# Patient Record
Sex: Female | Born: 1955
Health system: Southern US, Community
[De-identification: ages and names within clinical notes are randomized; demographics above are authoritative.]

## PROBLEM LIST (undated history)

## (undated) DIAGNOSIS — N200 Calculus of kidney: Secondary | ICD-10-CM

## (undated) DIAGNOSIS — D649 Anemia, unspecified: Secondary | ICD-10-CM

## (undated) DIAGNOSIS — H9190 Unspecified hearing loss, unspecified ear: Secondary | ICD-10-CM

## (undated) DIAGNOSIS — E1143 Type 2 diabetes mellitus with diabetic autonomic (poly)neuropathy: Secondary | ICD-10-CM

## (undated) DIAGNOSIS — D509 Iron deficiency anemia, unspecified: Secondary | ICD-10-CM

## (undated) DIAGNOSIS — R51 Headache: Secondary | ICD-10-CM

## (undated) DIAGNOSIS — K3184 Gastroparesis: Secondary | ICD-10-CM

## (undated) DIAGNOSIS — F419 Anxiety disorder, unspecified: Secondary | ICD-10-CM

## (undated) DIAGNOSIS — E039 Hypothyroidism, unspecified: Secondary | ICD-10-CM

## (undated) DIAGNOSIS — E785 Hyperlipidemia, unspecified: Secondary | ICD-10-CM

## (undated) HISTORY — DX: Anxiety disorder, unspecified: F41.9

## (undated) HISTORY — DX: Calculus of kidney: N20.0

## (undated) HISTORY — PX: WRIST SURGERY: SHX841

## (undated) HISTORY — DX: Hypothyroidism, unspecified: E03.9

## (undated) HISTORY — DX: Hyperlipidemia, unspecified: E78.5

---

## 1998-09-07 ENCOUNTER — Other Ambulatory Visit: Admission: RE | Admit: 1998-09-07 | Discharge: 1998-09-07 | Payer: Self-pay | Admitting: *Deleted

## 1999-09-21 ENCOUNTER — Other Ambulatory Visit: Admission: RE | Admit: 1999-09-21 | Discharge: 1999-09-21 | Payer: Self-pay | Admitting: *Deleted

## 2000-11-14 ENCOUNTER — Other Ambulatory Visit: Admission: RE | Admit: 2000-11-14 | Discharge: 2000-11-14 | Payer: Self-pay | Admitting: Gynecology

## 2002-01-22 ENCOUNTER — Other Ambulatory Visit: Admission: RE | Admit: 2002-01-22 | Discharge: 2002-01-22 | Payer: Self-pay | Admitting: Gynecology

## 2002-07-16 HISTORY — PX: COLONOSCOPY: SHX174

## 2003-03-01 ENCOUNTER — Other Ambulatory Visit: Admission: RE | Admit: 2003-03-01 | Discharge: 2003-03-01 | Payer: Self-pay | Admitting: Gynecology

## 2003-03-31 ENCOUNTER — Encounter: Payer: Self-pay | Admitting: Internal Medicine

## 2003-03-31 ENCOUNTER — Encounter: Admission: RE | Admit: 2003-03-31 | Discharge: 2003-03-31 | Payer: Self-pay | Admitting: Internal Medicine

## 2003-04-29 ENCOUNTER — Ambulatory Visit (HOSPITAL_COMMUNITY): Admission: RE | Admit: 2003-04-29 | Discharge: 2003-04-29 | Payer: Self-pay | Admitting: *Deleted

## 2004-04-06 ENCOUNTER — Other Ambulatory Visit: Admission: RE | Admit: 2004-04-06 | Discharge: 2004-04-06 | Payer: Self-pay | Admitting: Gynecology

## 2008-03-19 ENCOUNTER — Emergency Department (HOSPITAL_COMMUNITY): Admission: EM | Admit: 2008-03-19 | Discharge: 2008-03-19 | Payer: Self-pay | Admitting: Emergency Medicine

## 2009-10-14 ENCOUNTER — Encounter: Payer: Self-pay | Admitting: Endocrinology

## 2009-10-14 LAB — CONVERTED CEMR LAB: Pap Smear: NORMAL

## 2010-04-21 ENCOUNTER — Encounter: Payer: Self-pay | Admitting: Pediatrics

## 2010-06-13 ENCOUNTER — Encounter: Payer: Self-pay | Admitting: Endocrinology

## 2010-08-24 ENCOUNTER — Ambulatory Visit (INDEPENDENT_AMBULATORY_CARE_PROVIDER_SITE_OTHER): Payer: 59 | Admitting: Endocrinology

## 2010-08-24 ENCOUNTER — Encounter: Payer: Self-pay | Admitting: Endocrinology

## 2010-08-24 DIAGNOSIS — IMO0002 Reserved for concepts with insufficient information to code with codable children: Secondary | ICD-10-CM | POA: Insufficient documentation

## 2010-08-24 DIAGNOSIS — E7800 Pure hypercholesterolemia, unspecified: Secondary | ICD-10-CM | POA: Insufficient documentation

## 2010-08-24 DIAGNOSIS — E1165 Type 2 diabetes mellitus with hyperglycemia: Secondary | ICD-10-CM | POA: Insufficient documentation

## 2010-08-24 DIAGNOSIS — H9190 Unspecified hearing loss, unspecified ear: Secondary | ICD-10-CM | POA: Insufficient documentation

## 2010-08-24 DIAGNOSIS — E039 Hypothyroidism, unspecified: Secondary | ICD-10-CM | POA: Insufficient documentation

## 2010-08-24 DIAGNOSIS — G43909 Migraine, unspecified, not intractable, without status migrainosus: Secondary | ICD-10-CM | POA: Insufficient documentation

## 2010-08-31 NOTE — Assessment & Plan Note (Signed)
Summary: NEW ENDO/DIABETES/THYROID/LOW BLOOD COUNT/UMR/#-LB   Vital Signs:  Patient profile:   55 year old female LMP:     07/16/2010 Height:      62 inches (157.48 cm) Weight:      140.13 pounds (63.70 kg) BMI:     25.72 O2 Sat:      96 % on Room air Temp:     98.4 degrees F (36.89 degrees C) oral Pulse rate:   68 / minute Pulse rhythm:   regular BP sitting:   108 / 78  (left arm) Cuff size:   regular  Vitals Entered By: Brenton Grills CMA Duncan Dull) (August 24, 2010 2:17 PM)  O2 Flow:  Room air CC: New Endo Consult/DM/thyroid/aj Is Patient Diabetic? Yes LMP (date): 07/16/2010     Enter LMP: 07/16/2010 Last PAP Result normal   Referring Provider:  [facility]) Primary Provider:  Merri Brunette MD  CC:  New Endo Consult/DM/thyroid/aj.  History of Present Illness: pt was noted to have slightly elev tsh at health screening, twice in 2011.   she reports few years of slight thinning of the hair on her head, and assoc fatigue  Current Medications (verified): 1)  Glipizide 5 Mg Tabs (Glipizide) .Marland Kitchen.. 1 Tablet By Mouth Two Times A Day 2)  Metformin Hcl 500 Mg Xr24h-Tab (Metformin Hcl) .... 4 Tablets By Mouth Once Daily 3)  Actos 45 Mg Tabs (Pioglitazone Hcl) .Marland Kitchen.. 1 Tablet By Mouth Once Daily 4)  Pravastatin Sodium 40 Mg Tabs (Pravastatin Sodium) .Marland Kitchen.. 1 Tablet By Mouth Once Daily 5)  Losartan Potassium 25 Mg Tabs (Losartan Potassium) .Marland Kitchen.. 1 Tablet By Mouth Once Daily 6)  Imitrex 25 Mg Tabs (Sumatriptan Succinate) .Marland Kitchen.. 1 Tablet By Mouth As Needed For Migraines 7)  Multivitamins  Tabs (Multiple Vitamin) .Marland Kitchen.. 1 By Mouth Once Daily 8)  Calcium-Vitamin D 600-200 Mg-Unit Tabs (Calcium-Vitamin D) .Marland Kitchen.. 1 Tablet By Mouth Once Daily 9)  Vitamin D3 1000 Unit Tabs (Cholecalciferol) .Marland Kitchen.. 1 Tablet By Mouth Three Times A Day 10)  B Complex  Tabs (B Complex Vitamins) .Marland Kitchen.. 1 Tablet By Mouth Once Daily 11)  Fish Oil 1000 Mg Caps (Omega-3 Fatty Acids) .Marland Kitchen.. 1 Capsule By Mouth Three  Times A Day 12)  Stool Softener 100 Mg Caps (Docusate Sodium) .... As Needed  Allergies (verified): 1)  ! Lisinopril (Lisinopril)  Past History:  Past Medical History: MIGRAINE HEADACHE (ICD-346.90) HYPERCHOLESTEROLEMIA (ICD-272.0) DM (ICD-250.00) HEARING LOSS (ICD-389.9) HYPOTHYROIDISM (ICD-244.9)  Family History: Reviewed history and no changes required. Family History of Arthritis (Parent) Family History High cholesterol (Parent, Grandparent, Other Blood Relatives) Family History Hypertension (Parents, Grandparents) Family History of Colon Cancer (Grandparent) Family History of Breast Cancer (Other Blood Relative) goiter: grandmother dm: none  Social History: Reviewed history and no changes required. Married Never Smoked Alcohol use-no Drug use-yes Regular exercise-no works as Museum/gallery exhibitions officer.   Smoking Status:  never Drug Use:  yes Does Patient Exercise:  no Seat Belt Use:  yes  Review of Systems       denies depression, sob, memory loss, numbness, myalgias, dry skin, syncope, easy bruising, and rhinorrhea.  she has muscle cramps.  she has slight leg edema.  she has lost 30 lbs, x 1 year.  she has constipation and slight blurry vision.     Physical Exam  General:  Well developed, well nourished, in no acute distress.  Head:  head: no deformity eyes: no periorbital swelling, no proptosis external nose and ears are normal mouth: no  lesion seen Neck:  thyroid is not enlarged Lungs:  Clear to auscultation bilaterally. Normal respiratory effort.  Heart:  Regular rate and rhythm without murmurs or gallops noted. Normal S1,S2.   Abdomen:  abdomen is soft, nontender.  no hepatosplenomegaly.   not distended.  no hernia  Msk:  muscle bulk and strength are grossly normal.  no obvious joint swelling.  gait is normal and steady  Extremities:  trace right pedal edema and trace left pedal edema.  no deformity  Neurologic:  cn 2-12 grossly intact.   readily  moves all 4's.   sensation is intact to touch on all 4's Skin:  normal texture and temp.  no rash.  not diaphoretic  Cervical Nodes:  No significant adenopathy.  Psych:  Alert and cooperative; normal mood and affect; normal attention span and concentration.   Additional Exam:  outside test results are reviewed:  tsh=5.0 (2011)   Impression & Recommendations:  Problem # 1:  HYPOTHYROIDISM (ICD-244.9) we discussed the risks and benefits of synthroid (both of which are low).    Problem # 2:  DM (ICD-250.00) #1 has little if any effect on this.    Problem # 3:  edema mild due to actos she does not need to stop the actos unless the edema is worse.    Medications Added to Medication List This Visit: 1)  Glipizide 5 Mg Tabs (Glipizide) .Marland Kitchen.. 1 tablet by mouth two times a day 2)  Metformin Hcl 500 Mg Xr24h-tab (Metformin hcl) .... 4 tablets by mouth once daily 3)  Actos 45 Mg Tabs (Pioglitazone hcl) .Marland Kitchen.. 1 tablet by mouth once daily 4)  Pravastatin Sodium 40 Mg Tabs (Pravastatin sodium) .Marland Kitchen.. 1 tablet by mouth once daily 5)  Losartan Potassium 25 Mg Tabs (Losartan potassium) .Marland Kitchen.. 1 tablet by mouth once daily 6)  Imitrex 25 Mg Tabs (Sumatriptan succinate) .Marland Kitchen.. 1 tablet by mouth as needed for migraines 7)  Multivitamins Tabs (Multiple vitamin) .Marland Kitchen.. 1 by mouth once daily 8)  Calcium-vitamin D 600-200 Mg-unit Tabs (Calcium-vitamin d) .Marland Kitchen.. 1 tablet by mouth once daily 9)  Vitamin D3 1000 Unit Tabs (Cholecalciferol) .Marland Kitchen.. 1 tablet by mouth three times a day 10)  B Complex Tabs (B complex vitamins) .Marland Kitchen.. 1 tablet by mouth once daily 11)  Fish Oil 1000 Mg Caps (Omega-3 fatty acids) .Marland Kitchen.. 1 capsule by mouth three times a day 12)  Stool Softener 100 Mg Caps (Docusate sodium) .... As needed 13)  Levothyroxine Sodium 25 Mcg Tabs (Levothyroxine sodium) .Marland Kitchen.. 1 tab once daily  Other Orders: Consultation Level IV (16109)  Patient Instructions: 1)  cc dr piggott (g[place], [place]), and dr pharr. 2)  take  levothyroxine 25 micrograms/day 3)  come back to our lab in about 1 month, to recheck tsh 244.9 4)  return here as needed. Prescriptions: LEVOTHYROXINE SODIUM 25 MCG TABS (LEVOTHYROXINE SODIUM) 1 tab once daily  #90 x 3   Entered and Authorized by:   Minus Breeding MD   Signed by:   Minus Breeding MD on 08/24/2010   Method used:   Electronically to        [facility]* (retail)       [address]       [address] Shipping/mailing       [place], [place]  [address]       Ph: [phone]       Fax: [phone]   RxID:   [id number]    Orders Added:  1)  Consultation Level IV [30865]   Immunization History:  Tetanus/Td Immunization History:    Tetanus/Td:  historical (07/17/2007)  Influenza Immunization History:    Influenza:  historical (05/16/2010)  Pneumovax Immunization History:    Pneumovax:  historical (07/16/2008)   Immunization History:  Tetanus/Td Immunization History:    Tetanus/Td:  Historical (07/17/2007)  Influenza Immunization History:    Influenza:  Historical (05/16/2010)  Pneumovax Immunization History:    Pneumovax:  Historical (07/16/2008)    Preventive Care Screening  Mammogram:    Date:  04/15/2010    Results:  normal   Pap Smear:    Date:  10/14/2009    Results:  normal   Last Tetanus Booster:    Date:  07/17/2007    Results:  Historical   Colonoscopy:    Date:  07/17/2003    Results:  done

## 2010-09-06 NOTE — Letter (Signed)
Summary: Employer Screening  Employer Screening   Imported By: Lester Millard 08/28/2010 10:33:59  _____________________________________________________________________  External Attachment:    Type:   Image     Comment:   External Document

## 2010-11-02 ENCOUNTER — Other Ambulatory Visit (INDEPENDENT_AMBULATORY_CARE_PROVIDER_SITE_OTHER): Payer: 59

## 2010-11-02 DIAGNOSIS — E039 Hypothyroidism, unspecified: Secondary | ICD-10-CM

## 2010-11-04 ENCOUNTER — Emergency Department (HOSPITAL_COMMUNITY): Payer: 59

## 2010-11-04 ENCOUNTER — Emergency Department (HOSPITAL_COMMUNITY)
Admission: EM | Admit: 2010-11-04 | Discharge: 2010-11-04 | Disposition: A | Discharge status: Home / Self Care | Payer: 59 | Attending: Emergency Medicine | Admitting: Emergency Medicine

## 2010-11-04 DIAGNOSIS — Z79899 Other long term (current) drug therapy: Secondary | ICD-10-CM | POA: Insufficient documentation

## 2010-11-04 DIAGNOSIS — E119 Type 2 diabetes mellitus without complications: Secondary | ICD-10-CM | POA: Insufficient documentation

## 2010-11-04 DIAGNOSIS — R079 Chest pain, unspecified: Secondary | ICD-10-CM | POA: Insufficient documentation

## 2010-11-04 DIAGNOSIS — E039 Hypothyroidism, unspecified: Secondary | ICD-10-CM | POA: Insufficient documentation

## 2010-11-04 DIAGNOSIS — D649 Anemia, unspecified: Secondary | ICD-10-CM | POA: Insufficient documentation

## 2010-11-04 LAB — POCT CARDIAC MARKERS
CKMB, poc: <1 ng/mL — ABNORMAL LOW (ref 1.0–8.0)
CKMB, poc: <1 ng/mL — ABNORMAL LOW (ref 1.0–8.0)
Myoglobin, poc: 24.5 ng/mL (ref 12–200)
Troponin i, poc: <0.05 ng/mL (ref 0.00–0.09)

## 2010-11-04 LAB — DIFFERENTIAL
Basophils Absolute: 0 10*3/uL (ref 0.0–0.1)
Basophils Relative: 1 % (ref 0–1)
Lymphs Abs: 2.6 10*3/uL (ref 0.7–4.0)
Monocytes Absolute: 0.5 10*3/uL (ref 0.1–1.0)

## 2010-11-04 LAB — BASIC METABOLIC PANEL
BUN: 20 mg/dL (ref 6–23)
CO2: 26 mEq/L (ref 19–32)
GFR calc non Af Amer: >60 mL/min (ref >60)
Glucose, Bld: 217 mg/dL — ABNORMAL HIGH (ref 70–99)
Potassium: 3.8 mEq/L (ref 3.5–5.1)

## 2010-11-04 LAB — CBC
MCHC: 31.9 g/dL (ref 30.0–36.0)
MCV: 66.4 fL — ABNORMAL LOW (ref 78.0–100.0)
Platelets: 208 10*3/uL (ref 150–400)
RDW: 14.9 % (ref 11.5–15.5)
WBC: 6 10*3/uL (ref 4.0–10.5)

## 2010-12-01 NOTE — Op Note (Signed)
   NAME:  Melissa Schneider, Melissa Schneider                       ACCOUNT NO.:  [id number]   MEDICAL RECORD NO.:  [id number]                   PATIENT TYPE:  AMB   LOCATION:  ENDO                                 FACILITY:  [facility]   PHYSICIAN:  Georgiana Spinner, M.D.                 DATE OF BIRTH:  [birth year]   DATE OF PROCEDURE:  04/29/2003  DATE OF DISCHARGE:                                 OPERATIVE REPORT   PROCEDURE PERFORMED:  Colonoscopy.   ENDOSCOPIST:  Georgiana Spinner, M.D.   INDICATIONS FOR PROCEDURE:  Rectal bleeding.   ANESTHESIA:  Demerol 90 mg, Versed 9 mg.   DESCRIPTION OF PROCEDURE:  With the patient mildly sedated in the left  lateral decubitus position, the Olympus video colonoscope was inserted in  the rectum and passed under direct vision with pressure applied to the  abdomen to the cecum, identified by the ileocecal valve and appendiceal  orifice, both of which were photographed.  We entered into the terminal  ileum which also appeared normal and was photographed.  From this point the  colonoscope was slowly withdrawn taking circumferential views of the colonic  mucosa stopping only in the rectum which appeared normal on direct and  showed small hemorrhoids on retroflex view.  The endoscope was straightened  and withdrawn.  The patient's vital signs and pulse oximeter remained  stable.  The patient tolerated the procedure well without apparent  complications.   FINDINGS:  On rectal examination, I felt a small fissure and small internal  hemorrhoids were visualized but we were unable to photograph them.   IMPRESSION:  Probably anorectal disease cause of her rectal bleeding,  otherwise an unremarkable examination.  Follow-up with me as needed.                                                Georgiana Spinner, M.D.    GMO/MEDQ  D:  04/29/2003  T:  04/29/2003  Job:  [id number]

## 2011-07-20 ENCOUNTER — Encounter: Payer: Self-pay | Admitting: *Deleted

## 2011-07-20 ENCOUNTER — Other Ambulatory Visit: Payer: Self-pay

## 2011-07-20 ENCOUNTER — Inpatient Hospital Stay (HOSPITAL_COMMUNITY)
Admission: EM | Admit: 2011-07-20 | Discharge: 2011-07-24 | DRG: 638 | Disposition: A | Discharge status: Home / Self Care | Payer: 59 | Attending: Internal Medicine | Admitting: Internal Medicine

## 2011-07-20 ENCOUNTER — Emergency Department (HOSPITAL_COMMUNITY): Payer: 59

## 2011-07-20 DIAGNOSIS — D509 Iron deficiency anemia, unspecified: Secondary | ICD-10-CM | POA: Diagnosis present

## 2011-07-20 DIAGNOSIS — E1165 Type 2 diabetes mellitus with hyperglycemia: Secondary | ICD-10-CM | POA: Diagnosis present

## 2011-07-20 DIAGNOSIS — E871 Hypo-osmolality and hyponatremia: Secondary | ICD-10-CM | POA: Diagnosis present

## 2011-07-20 DIAGNOSIS — K5900 Constipation, unspecified: Secondary | ICD-10-CM | POA: Diagnosis present

## 2011-07-20 DIAGNOSIS — E7800 Pure hypercholesterolemia, unspecified: Secondary | ICD-10-CM | POA: Diagnosis present

## 2011-07-20 DIAGNOSIS — K3184 Gastroparesis: Secondary | ICD-10-CM | POA: Diagnosis present

## 2011-07-20 DIAGNOSIS — IMO0002 Reserved for concepts with insufficient information to code with codable children: Secondary | ICD-10-CM | POA: Diagnosis present

## 2011-07-20 DIAGNOSIS — D649 Anemia, unspecified: Secondary | ICD-10-CM | POA: Diagnosis present

## 2011-07-20 DIAGNOSIS — E1143 Type 2 diabetes mellitus with diabetic autonomic (poly)neuropathy: Secondary | ICD-10-CM | POA: Diagnosis present

## 2011-07-20 DIAGNOSIS — D72829 Elevated white blood cell count, unspecified: Secondary | ICD-10-CM | POA: Diagnosis present

## 2011-07-20 DIAGNOSIS — Z794 Long term (current) use of insulin: Secondary | ICD-10-CM

## 2011-07-20 DIAGNOSIS — E1310 Other specified diabetes mellitus with ketoacidosis without coma: Principal | ICD-10-CM | POA: Diagnosis present

## 2011-07-20 DIAGNOSIS — E039 Hypothyroidism, unspecified: Secondary | ICD-10-CM | POA: Diagnosis present

## 2011-07-20 DIAGNOSIS — G43909 Migraine, unspecified, not intractable, without status migrainosus: Secondary | ICD-10-CM | POA: Diagnosis present

## 2011-07-20 DIAGNOSIS — E1110 Type 2 diabetes mellitus with ketoacidosis without coma: Secondary | ICD-10-CM | POA: Diagnosis present

## 2011-07-20 DIAGNOSIS — E785 Hyperlipidemia, unspecified: Secondary | ICD-10-CM | POA: Diagnosis present

## 2011-07-20 DIAGNOSIS — E876 Hypokalemia: Secondary | ICD-10-CM | POA: Diagnosis not present

## 2011-07-20 DIAGNOSIS — E1149 Type 2 diabetes mellitus with other diabetic neurological complication: Secondary | ICD-10-CM | POA: Diagnosis present

## 2011-07-20 HISTORY — DX: Iron deficiency anemia, unspecified: D50.9

## 2011-07-20 HISTORY — DX: Anemia, unspecified: D64.9

## 2011-07-20 HISTORY — DX: Type 2 diabetes mellitus with diabetic autonomic (poly)neuropathy: E11.43

## 2011-07-20 HISTORY — DX: Gastroparesis: K31.84

## 2011-07-20 HISTORY — DX: Unspecified hearing loss, unspecified ear: H91.90

## 2011-07-20 LAB — GLUCOSE, CAPILLARY
Glucose-Capillary: 531 mg/dL — ABNORMAL HIGH (ref 70–99)
Glucose-Capillary: 495 mg/dL — ABNORMAL HIGH (ref 70–99)
Glucose-Capillary: 390 mg/dL — ABNORMAL HIGH (ref 70–99)
Glucose-Capillary: 415 mg/dL — ABNORMAL HIGH (ref 70–99)
Glucose-Capillary: 376 mg/dL — ABNORMAL HIGH (ref 70–99)
Glucose-Capillary: 201 mg/dL — ABNORMAL HIGH (ref 70–99)
Glucose-Capillary: 202 mg/dL — ABNORMAL HIGH (ref 70–99)
Glucose-Capillary: 136 mg/dL — ABNORMAL HIGH (ref 70–99)
Glucose-Capillary: 134 mg/dL — ABNORMAL HIGH (ref 70–99)

## 2011-07-20 LAB — COMPREHENSIVE METABOLIC PANEL
ALT: 17 U/L (ref 0–35)
AST: 17 U/L (ref 0–37)
Albumin: 4 g/dL (ref 3.5–5.2)
CO2: <5 mEq/L — CL (ref 19–32)
Calcium: 10 mg/dL (ref 8.4–10.5)
Creatinine, Ser: 0.71 mg/dL (ref 0.50–1.10)
Sodium: 130 mEq/L — ABNORMAL LOW (ref 135–145)
Total Protein: 7.6 g/dL (ref 6.0–8.3)

## 2011-07-20 LAB — URINE MICROSCOPIC-ADD ON

## 2011-07-20 LAB — BASIC METABOLIC PANEL
CO2: <5 mEq/L — CL (ref 19–32)
Calcium: 9 mg/dL (ref 8.4–10.5)
Chloride: 109 mEq/L (ref 96–112)
Chloride: 107 mEq/L (ref 96–112)
GFR calc Af Amer: >90 mL/min (ref >90)
GFR calc Af Amer: >90 mL/min (ref >90)
GFR calc Af Amer: >90 mL/min (ref >90)
GFR calc non Af Amer: >90 mL/min (ref >90)
GFR calc non Af Amer: >90 mL/min (ref >90)
Potassium: 3.3 mEq/L — ABNORMAL LOW (ref 3.5–5.1)
Potassium: 3.2 mEq/L — ABNORMAL LOW (ref 3.5–5.1)
Potassium: 3.4 mEq/L — ABNORMAL LOW (ref 3.5–5.1)
Sodium: 135 mEq/L (ref 135–145)
Sodium: 133 mEq/L — ABNORMAL LOW (ref 135–145)

## 2011-07-20 LAB — URINALYSIS, ROUTINE W REFLEX MICROSCOPIC
Glucose, UA: >1000 mg/dL — AB
Ketones, ur: >80 mg/dL — AB
Leukocytes, UA: NEGATIVE
pH: 5.5 (ref 5.0–8.0)

## 2011-07-20 LAB — CBC
HCT: 39.4 % (ref 36.0–46.0)
Hemoglobin: 11.8 g/dL — ABNORMAL LOW (ref 12.0–15.0)
Hemoglobin: 11.1 g/dL — ABNORMAL LOW (ref 12.0–15.0)
MCH: 20.6 pg — ABNORMAL LOW (ref 26.0–34.0)
MCH: 20.4 pg — ABNORMAL LOW (ref 26.0–34.0)
MCHC: 29.9 g/dL — ABNORMAL LOW (ref 30.0–36.0)
MCV: 68.8 fL — ABNORMAL LOW (ref 78.0–100.0)
Platelets: 261 10*3/uL (ref 150–400)
RBC: 5.73 MIL/uL — ABNORMAL HIGH (ref 3.87–5.11)
RBC: 5.45 MIL/uL — ABNORMAL HIGH (ref 3.87–5.11)
WBC: 20.7 10*3/uL — ABNORMAL HIGH (ref 4.0–10.5)

## 2011-07-20 LAB — CARDIAC PANEL(CRET KIN+CKTOT+MB+TROPI): Relative Index: INVALID (ref 0.0–2.5)

## 2011-07-20 MED ORDER — SODIUM CHLORIDE 0.9 % IV SOLN: INTRAVENOUS | Status: DC

## 2011-07-20 MED ORDER — SODIUM CHLORIDE 0.9 % IV BOLUS (SEPSIS)
1000.0000 mL | Freq: Once | INTRAVENOUS | Status: AC
  Administered 2011-07-20: 1000 mL via INTRAVENOUS

## 2011-07-20 MED ORDER — SODIUM BICARBONATE 8.4 % IV SOLN
50.0000 meq | Freq: Once | INTRAVENOUS | Status: AC
  Administered 2011-07-20: 50 meq via INTRAVENOUS
  Filled 2011-07-20: qty 50

## 2011-07-20 MED ORDER — SODIUM CHLORIDE 0.9 % IV SOLN
INTRAVENOUS | Status: DC
  Administered 2011-07-20: 19:00:00 via INTRAVENOUS
  Filled 2011-07-20: qty 1

## 2011-07-20 MED ORDER — INSULIN REGULAR HUMAN 100 UNIT/ML IJ SOLN
INTRAMUSCULAR | Status: AC
  Filled 2011-07-20: qty 3

## 2011-07-20 MED ORDER — ONDANSETRON HCL 4 MG/2ML IJ SOLN
4.0000 mg | Freq: Once | INTRAMUSCULAR | Status: AC
  Administered 2011-07-20: 4 mg via INTRAVENOUS
  Filled 2011-07-20: qty 2

## 2011-07-20 MED ORDER — PNEUMOCOCCAL VAC POLYVALENT 25 MCG/0.5ML IJ INJ
0.5000 mL | INJECTION | INTRAMUSCULAR | Status: AC
  Filled 2011-07-20: qty 0.5

## 2011-07-20 MED ORDER — INSULIN REGULAR HUMAN 100 UNIT/ML IJ SOLN
INTRAMUSCULAR | Status: DC
  Administered 2011-07-20: 4.4 [IU]/h via INTRAVENOUS
  Filled 2011-07-20: qty 1

## 2011-07-20 MED ORDER — SUMATRIPTAN SUCCINATE 6 MG/0.5ML ~~LOC~~ SOLN
6.0000 mg | SUBCUTANEOUS | Status: AC | PRN
  Administered 2011-07-21 (×2): 6 mg via SUBCUTANEOUS
  Filled 2011-07-20: qty 0.5

## 2011-07-20 MED ORDER — HYDROMORPHONE HCL PF 1 MG/ML IJ SOLN
1.0000 mg | Freq: Once | INTRAMUSCULAR | Status: AC
  Administered 2011-07-20: 1 mg via INTRAVENOUS
  Filled 2011-07-20: qty 1

## 2011-07-20 MED ORDER — INSULIN REGULAR BOLUS VIA INFUSION: 0.0000 [IU] | Freq: Three times a day (TID) | INTRAVENOUS | Status: DC

## 2011-07-20 MED ORDER — INSULIN ASPART 100 UNIT/ML ~~LOC~~ SOLN
10.0000 [IU] | Freq: Once | SUBCUTANEOUS | Status: DC
  Filled 2011-07-20: qty 3

## 2011-07-20 MED ORDER — DEXTROSE 50 % IV SOLN: 25.0000 mL | INTRAVENOUS | Status: DC | PRN

## 2011-07-20 MED ORDER — DEXTROSE-NACL 5-0.45 % IV SOLN: INTRAVENOUS | Status: DC

## 2011-07-20 MED ORDER — POTASSIUM CHLORIDE 10 MEQ/100ML IV SOLN
10.0000 meq | INTRAVENOUS | Status: AC
  Administered 2011-07-20 (×4): 10 meq via INTRAVENOUS
  Filled 2011-07-20: qty 100
  Filled 2011-07-20: qty 200
  Filled 2011-07-20: qty 100

## 2011-07-20 MED ORDER — INSULIN REGULAR HUMAN 100 UNIT/ML IJ SOLN
10.0000 [IU] | Freq: Once | INTRAMUSCULAR | Status: DC
  Filled 2011-07-20 (×2): qty 0.1

## 2011-07-20 MED ORDER — DEXTROSE-NACL 5-0.45 % IV SOLN
INTRAVENOUS | Status: DC
  Administered 2011-07-20: 19:00:00 via INTRAVENOUS
  Administered 2011-07-21: 125 mL via INTRAVENOUS

## 2011-07-20 MED ORDER — SODIUM BICARBONATE 4 % IV SOLN: 5.0000 mL | Freq: Once | INTRAVENOUS | Status: DC

## 2011-07-20 MED ORDER — INFLUENZA VIRUS VACC SPLIT PF IM SUSP
0.5000 mL | INTRAMUSCULAR | Status: DC
  Filled 2011-07-20: qty 0.5

## 2011-07-20 NOTE — ED Notes (Signed)
Social worker. Notified of BGL 495

## 2011-07-20 NOTE — ED Notes (Signed)
Called icu to give report, no answer

## 2011-07-20 NOTE — H&P (Signed)
PCP:   Londell Moh, MD, MD   Chief Complaint:  Nausea vomiting  HPI: Patient is a 56 year old white female with past medical history diabetes mellitus type 2 on insulin he started having episodes of uncontrolled nausea and vomiting and abdominal discomfort for the past day. She was noted to have an elevated blood sugar this morning of 435 and with her nausea and vomiting persisting, he felt it best to come into the emergency room. Upon arrival to the emergency room, her. Sugar was noted to be 537 with a bicarbonate level of less than 5 consistent with an elevated anion gap of greater than 30. Patient was started on IV fluids and IV insulin as per the DKA protocol and hospitals were called for admission.  Patient's white blood cell count was also noted to be elevated at 20. She had no fever. She was hypotensive, but this was felt to be more because of dehydration. A pro calcitonin and lactic acid level ordered and were negative. Incidentally cardiac markers were also negative.  Patient was then sent upstairs to the step down unit. Upon arrival her blood sugar was in the 200s, but with a repeat basic metabolic panel, her bicarbonate was still less than 5 although overall her anion gap had improved to now a little above 20. The patient her so so she's a little better. She denies any headaches, vision changes, dysphasia, chest pain, palpitations, shortness of breath, wheeze, cough, decreased discomfort in her abdomen, hematuria, dysuria, constipation or diarrhea. She says her mouth feels very dry and that is her biggest complaint. Review of systems otherwise negative.   Past Medical History: Past Medical History  Diagnosis Date  . Diabetes mellitus   . Hearing difficulty    History reviewed. No pertinent past surgical history.  Medications: Prior to Admission medications   Medication Sig Start Date End Date Taking? Authorizing Provider  glipiZIDE (GLUCOTROL) 5 MG tablet Take 5 mg by  mouth 2 (two) times daily before a meal.     Yes Historical Provider, MD  insulin glargine (LANTUS) 100 UNIT/ML injection Inject 20 Units into the skin at bedtime.     Yes Historical Provider, MD  levothyroxine (SYNTHROID, LEVOTHROID) 50 MCG tablet Take 50 mcg by mouth daily.     Yes Historical Provider, MD  losartan (COZAAR) 25 MG tablet Take 25 mg by mouth daily.     Yes Historical Provider, MD  metformin (FORTAMET) 1000 MG (OSM) 24 hr tablet Take 1,000 mg by mouth daily with breakfast.     Yes Historical Provider, MD  pravastatin (PRAVACHOL) 40 MG tablet Take 40 mg by mouth daily.     Yes Historical Provider, MD  sitaGLIPtin (JANUVIA) 100 MG tablet Take 100 mg by mouth every evening.    Yes Historical Provider, MD  SUMAtriptan (IMITREX) 100 MG tablet Take 100 mg by mouth every 2 (two) hours as needed. For migraines   Yes Historical Provider, MD  SUMAtriptan (IMITREX) 6 MG/0.5ML SOLN injection Inject 6 mg into the skin every 2 (two) hours as needed. For migraines    Yes Historical Provider, MD    Allergies:   Allergies  Allergen Reactions  . Lisinopril     REACTION: coughing    Social History:  reports that she has never smoked. She does not have any smokeless tobacco history on file. She reports that she does not drink alcohol or use illicit drugs. The patient is normally at baseline able to participate in normal activities of daily living  without assistance. She lives at home with her husband.  Family History: Hypertension Physical Exam: Filed Vitals:   07/20/11 1628 07/20/11 1700 07/20/11 1800 07/20/11 1900  BP: 121/57 103/40 115/58 92/53  Pulse: 102 96 106 97  Temp: 97.6 F (36.4 C)     TempSrc: Oral     Resp: 35 33 25 21  Height:      Weight:      SpO2: 100% 100% 100% 100%   General: Alert and oriented x3, some mild distress secondary from being very fatigued and dehydrated, looks about stated age, fatigued HEENT: Normocephalic, atraumatic, mucous membranes are quite  dry Cardiovascular: Regular rhythm, occasional ectopic beat, mild tachycardia Lungs:" Bilaterally Abdomen: Soft, nontender, nondistended, hypoactive bowel sounds Extremities: No clubbing or cyanosis or edema.    Labs on Admission:   [facility] 07/20/11 1758 07/20/11 0845  NA 135 130*  K 3.3* 4.1  CL 109 95*  CO2 <5* <5*  GLUCOSE 196* 537*  BUN 17 19  CREATININE 0.66 0.71  CALCIUM 9.1 10.0  MG -- --  PHOS -- --    Basename 07/20/11 0845  AST 17  ALT 17  ALKPHOS 140*  BILITOT 0.1*  PROT 7.6  ALBUMIN 4.0    Basename 07/20/11 0845  LIPASE 67*  AMYLASE --    Basename 07/20/11 0845  WBC 20.2*  NEUTROABS --  HGB 11.8*  HCT 39.4  MCV 68.8*  PLT 301    Basename 07/20/11 1148  CKTOTAL 58  CKMB 2.6  CKMBINDEX --  TROPONINI <0.30   Pro calcitonin and lactic acid level normal. Urinalysis unremarkable.  Radiological Exams on Admission: Dg Chest Portable 1 View 07/20/2011   IMPRESSION: No active cardiopulmonary abnormalities.      Assessment/Plan Present on Admission:  .HYPOTHYROIDISM: Holding Synthroid until she is able to take by mouth.  Marland KitchenHYPERCHOLESTEROLEMIA: Holding statin until she is able to take by mouth.  Marland KitchenMIGRAINE HEADACHE: Stable. No headache at this time. She is on when necessary Imitrex.  .DM (diabetes mellitus), type 2, uncontrolled: Check A1 C. See below.  .DKA, type 2: Anion gap still elevated. Continue IV fluids of D5 half-normal saline plus insulin IV until her gap has resolved.  .Leukocytosis: Unclear she has an infection or this is from stress margination. It is not exactly clear as to what caused her DKA, although that may have been a brief gastroenteritis versus flu area watch for fever. Recheck by blood cell count has been almost 10 hours. Continue monitor closely.  After discussion with the patient, she is to be a full code.  We will respect these wishes.  I anticipate her length of stay to be 2-3 days based on recovery from DKA and  identification of cause.  Time spent on this patient including examination and decision-making process: 55 minutes.  Hollice Espy [phone] 07/20/2011, 7:17 PM

## 2011-07-20 NOTE — ED Notes (Signed)
Shanda Bumps RN called, I gave report

## 2011-07-20 NOTE — Progress Notes (Signed)
2247-lab called with critical lab of C02 of 8. Text paged Dr. Onalee Hua.

## 2011-07-20 NOTE — Progress Notes (Signed)
2120-DrOnalee Hua ordered 1 amp of bicarb for 2230

## 2011-07-20 NOTE — Progress Notes (Signed)
2115-critical lab-C02<5. Notified Dr. Onalee Hua.

## 2011-07-20 NOTE — ED Notes (Signed)
BGL 531 - notified Amber R.N.

## 2011-07-20 NOTE — ED Provider Notes (Addendum)
Scribed for Melissa Jakes, MD, the patient was seen in room APA05/APA05 . This chart was scribed by Ellie Lunch.   CSN: [id number]  Arrival date & time 07/20/11  0102   First MD Initiated Contact with Patient 07/20/11 0940      Chief Complaint  Patient presents with  . Emesis    (Consider location/radiation/quality/duration/timing/severity/associated sxs/prior treatment) The history is provided by the patient and the spouse. No language interpreter was used.   Pt seen at 10:07 AM Melissa Schneider is a 56 y.o. female who presents to the Emergency Department complaining of 1 day of sudden onset emesis. Pt woke up yesterday with HA, generalized abdominal pain, and body aches. Pt began vomiting at midnight and has had 7 episodes of emesis since. Emesis is  associated with nausea, generalized weakness, and elevated blood sugar (435 at home this am). Body aches and HA still persist as well. Pt denies any diarrhea, rash, neck pain, dysuria, or pedal edema. There are no other associated symptoms and no other alleviating or aggravating factors.      Past Medical History  Diagnosis Date  . Diabetes mellitus   . Hearing difficulty     History reviewed. No pertinent past surgical history.  No family history on file.  History  Substance Use Topics  . Smoking status: Never Smoker   . Smokeless tobacco: Not on file  . Alcohol Use: No    Review of Systems  Constitutional: Negative for fever and chills.       10 Systems reviewed and are negative for acute change except as noted in the HPI.  HENT: Negative for congestion and rhinorrhea.   Eyes: Negative for discharge and redness.  Respiratory: Negative for cough and shortness of breath.   Cardiovascular: Negative for chest pain.  Gastrointestinal: Positive for nausea, vomiting and abdominal pain. Negative for diarrhea.  Genitourinary: Negative for dysuria.  Musculoskeletal: Positive for myalgias.  Skin: Negative for rash.    Neurological: Positive for weakness and headaches. Negative for dizziness, syncope, speech difficulty and numbness.  Psychiatric/Behavioral: Negative for hallucinations and confusion.    Allergies  Lisinopril  Home Medications   Current Outpatient Rx  Name Route Sig Dispense Refill  . GLIPIZIDE 5 MG PO TABS Oral Take 5 mg by mouth 2 (two) times daily before a meal.      . INSULIN GLARGINE 100 UNIT/ML [place] SOLN Subcutaneous Inject 20 Units into the skin at bedtime.      Marland Kitchen LEVOTHYROXINE SODIUM 50 MCG PO TABS Oral Take 50 mcg by mouth daily.      Marland Kitchen LOSARTAN POTASSIUM 25 MG PO TABS Oral Take 25 mg by mouth daily.      Marland Kitchen METFORMIN HCL ER (OSM) 1000 MG PO TB24 Oral Take 1,000 mg by mouth daily with breakfast.      . PRAVASTATIN SODIUM 40 MG PO TABS Oral Take 40 mg by mouth daily.      Marland Kitchen SITAGLIPTIN PHOSPHATE 100 MG PO TABS Oral Take 100 mg by mouth every evening.     . SUMATRIPTAN SUCCINATE 100 MG PO TABS Oral Take 100 mg by mouth every 2 (two) hours as needed. For migraines    . SUMATRIPTAN SUCCINATE 6 MG/0.5ML [place] SOLN Subcutaneous Inject 6 mg into the skin every 2 (two) hours as needed. For migraines       BP 101/56  Pulse 105  Temp 97.9 F (36.6 C)  Resp 24  Ht 5\' 2"  (1.575 m)  Wt 120  lb (54.432 kg)  BMI 21.95 kg/m2  SpO2 100%  Physical Exam  Nursing note and vitals reviewed. Constitutional: She is oriented to person, place, and time. She appears well-developed and well-nourished.  HENT:  Head: Normocephalic and atraumatic.       Lips very dry. MM dry as well.   Eyes: Conjunctivae and EOM are normal.  Neck: Normal range of motion. Neck supple.  Cardiovascular: Normal rate, regular rhythm and normal heart sounds.   No murmur heard. Pulmonary/Chest: Effort normal and breath sounds normal.  Abdominal: Soft. Bowel sounds are normal. There is no tenderness.  Neurological: She is alert and oriented to person, place, and time. No cranial nerve deficit. Coordination normal.  Skin:  Skin is warm and dry.    ED Course  Procedures (including critical care time) DIAGNOSTIC STUDIES: Oxygen Saturation is 100% on room air, normal by my interpretation.    COORDINATION OF CARE:  Results for orders placed during the hospital encounter of 07/20/11  GLUCOSE, CAPILLARY      Component Value Range   Glucose-Capillary 531 (*) 70 - 99 (mg/dL)   Comment 1 Documented in Chart     Comment 2 Notify RN    COMPREHENSIVE METABOLIC PANEL      Component Value Range   Sodium 130 (*) 135 - 145 (mEq/L)   Potassium 4.1  3.5 - 5.1 (mEq/L)   Chloride 95 (*) 96 - 112 (mEq/L)   CO2 <5 (*) 19 - 32 (mEq/L)   Glucose, Bld 537 (*) 70 - 99 (mg/dL)   BUN 19  6 - 23 (mg/dL)   Creatinine, Ser 9.14  0.50 - 1.10 (mg/dL)   Calcium 78.2  8.4 - 10.5 (mg/dL)   Total Protein 7.6  6.0 - 8.3 (g/dL)   Albumin 4.0  3.5 - 5.2 (g/dL)   AST 17  0 - 37 (U/L)   ALT 17  0 - 35 (U/L)   Alkaline Phosphatase 140 (*) 39 - 117 (U/L)   Total Bilirubin 0.1 (*) 0.3 - 1.2 (mg/dL)   GFR calc non Af Amer >90  >90 (mL/min)   GFR calc Af Amer >90  >90 (mL/min)  CBC      Component Value Range   WBC 20.2 (*) 4.0 - 10.5 (K/uL)   RBC 5.73 (*) 3.87 - 5.11 (MIL/uL)   Hemoglobin 11.8 (*) 12.0 - 15.0 (g/dL)   HCT 95.6  21.3 - 08.6 (%)   MCV 68.8 (*) 78.0 - 100.0 (fL)   MCH 20.6 (*) 26.0 - 34.0 (pg)   MCHC 29.9 (*) 30.0 - 36.0 (g/dL)   RDW 57.8 (*) 46.9 - 15.5 (%)   Platelets 301  150 - 400 (K/uL)  LIPASE, BLOOD      Component Value Range   Lipase 67 (*) 11 - 59 (U/L)  GLUCOSE, CAPILLARY      Component Value Range   Glucose-Capillary 495 (*) 70 - 99 (mg/dL)   Dg Chest Portable 1 View  07/20/2011  *RADIOLOGY REPORT*  Clinical Data: Vomiting.  DKA.  PORTABLE CHEST - 1 VIEW  Comparison: 11/04/2010  Findings: The heart size and mediastinal contours are within normal limits.  Both lungs are clear.  The visualized skeletal structures are unremarkable.  IMPRESSION: No active cardiopulmonary abnormalities.  Original Report  Authenticated By: Rosealee Albee, M.D.   ED MEDICATIONS  Medications  0.9 %  sodium chloride infusion   dextrose 5 %-0.45 % sodium chloride infusion   insulin regular (NOVOLIN R,HUMULIN R) 1 Units/mL in  sodium chloride 0.9 % 100 mL infusion   dextrose 50 % solution 25 mL   0.9 %  sodium chloride infusion  insulin aspart (novoLOG) injection 10 Units   sodium chloride 0.9 % bolus 1,000 mL (1000 mL Intravenous Given 07/20/11 1041)  ondansetron (ZOFRAN) injection 4 mg (4 mg Intravenous Given 07/20/11 1041)  HYDROmorphone (DILAUDID) injection 1 mg (1 mg Intravenous Given 07/20/11 1042)    Date: 07/20/2011  Rate: 106  Rhythm: normal sinus rhythm  QRS Axis: normal  Intervals: right axis   ST/T Wave abnormalities: nonspecific ST/T changes  Conduction Disutrbances:none  Narrative Interpretation:   Old EKG Reviewed: unchanged EKG no snigficant changes since 11/04/2010.   CRITICAL CARE Performed by: Melissa Jakes, MD  Total critical care time: 30  Critical care time was exclusive of separately billable procedures and treating other patients.  Critical care was necessary to treat or prevent imminent or life-threatening deterioration.  Critical care was time spent personally by me on the following activities: development of treatment plan with patient and/or surrogate as well as nursing, discussions with consultants, evaluation of patient's response to treatment, examination of patient, obtaining history from patient or surrogate, ordering and performing treatments and interventions, ordering and review of laboratory studies, ordering and review of radiographic studies, pulse oximetry and re-evaluation of patient's condition.   1. DKA (diabetic ketoacidoses)      MDM   Patient with onset of illness suggestive of viral syndrome with bodyaches and then persistent vomiting starting shortly before midnight. Clinically he arrived in significant dehydration. Patient is a diabetic. Labs  consistent with marked hyperglycemia and significant acidosis. Marked leukocytosis. Chest x-ray negative. EKG without acute changes. Discussed with admitting hospitalist team who will admit to step down and discussion with them we had ordered additional labs to include cardiac marker and septic parameter labs.  Do to the marked dehydration patient has received the 3 L of normal saline bolus started on the glucose stabilized protocol following day IV insulin bolus of 10 units patient will remain on the glucose stabilized protocol.     I personally performed the services described in this documentation, which was scribed in my presence. The recorded information has been reviewed and considered.         Melissa Jakes, MD 07/20/11 1200  Melissa Jakes, MD 07/20/11 [phone]

## 2011-07-20 NOTE — ED Notes (Signed)
Pt states she woke up yesterday with headache and generalized abdominal pain. Pt states vomiting began at midnight and has vomited ~ x 7 since then. Husband states CBG at home was 435 at 0600.

## 2011-07-20 NOTE — ED Notes (Signed)
CRITICAL VALUE ALERT  Critical value received: C02 4  Date of notification: 07/20/2011  Time of notification: 0956  Critical value read back:yes  Nurse who received alert:  L. Elmer Picker RN  MD notified (1st page): Dr. Deretha Emory  Time of first page:  [phone] MD notified (2nd page):  Time of second page:  Responding MD: Dr. Deretha Emory Time MD responded: [phone]

## 2011-07-20 NOTE — ED Notes (Signed)
Advised patient and spouse - need urine.  Patient unable to use bathroom now.

## 2011-07-21 DIAGNOSIS — E876 Hypokalemia: Secondary | ICD-10-CM | POA: Diagnosis not present

## 2011-07-21 LAB — CBC
HCT: 34 % — ABNORMAL LOW (ref 36.0–46.0)
Hemoglobin: 10.9 g/dL — ABNORMAL LOW (ref 12.0–15.0)
MCV: 65.6 fL — ABNORMAL LOW (ref 78.0–100.0)
RBC: 5.18 MIL/uL — ABNORMAL HIGH (ref 3.87–5.11)
WBC: 15.1 10*3/uL — ABNORMAL HIGH (ref 4.0–10.5)

## 2011-07-21 LAB — HEMOGLOBIN A1C: Mean Plasma Glucose: 410 mg/dL — ABNORMAL HIGH (ref <117)

## 2011-07-21 LAB — BASIC METABOLIC PANEL
BUN: 15 mg/dL (ref 6–23)
BUN: 16 mg/dL (ref 6–23)
CO2: 12 mEq/L — ABNORMAL LOW (ref 19–32)
CO2: 13 mEq/L — ABNORMAL LOW (ref 19–32)
Calcium: 8.8 mg/dL (ref 8.4–10.5)
Calcium: 9 mg/dL (ref 8.4–10.5)
Calcium: 9 mg/dL (ref 8.4–10.5)
Chloride: 105 mEq/L (ref 96–112)
Chloride: 105 mEq/L (ref 96–112)
Chloride: 108 mEq/L (ref 96–112)
Creatinine, Ser: 0.59 mg/dL (ref 0.50–1.10)
Creatinine, Ser: 0.6 mg/dL (ref 0.50–1.10)
GFR calc Af Amer: >90 mL/min (ref >90)
GFR calc Af Amer: >90 mL/min (ref >90)
GFR calc non Af Amer: >90 mL/min (ref >90)
GFR calc non Af Amer: >90 mL/min (ref >90)
Glucose, Bld: 112 mg/dL — ABNORMAL HIGH (ref 70–99)
Glucose, Bld: 312 mg/dL — ABNORMAL HIGH (ref 70–99)
Potassium: 3.2 mEq/L — ABNORMAL LOW (ref 3.5–5.1)
Potassium: 2.9 mEq/L — ABNORMAL LOW (ref 3.5–5.1)
Sodium: 134 mEq/L — ABNORMAL LOW (ref 135–145)
Sodium: 134 mEq/L — ABNORMAL LOW (ref 135–145)
Sodium: 135 mEq/L (ref 135–145)

## 2011-07-21 LAB — GLUCOSE, CAPILLARY
Glucose-Capillary: 94 mg/dL (ref 70–99)
Glucose-Capillary: 96 mg/dL (ref 70–99)
Glucose-Capillary: 298 mg/dL — ABNORMAL HIGH (ref 70–99)
Glucose-Capillary: 403 mg/dL — ABNORMAL HIGH (ref 70–99)
Glucose-Capillary: 197 mg/dL — ABNORMAL HIGH (ref 70–99)
Glucose-Capillary: 231 mg/dL — ABNORMAL HIGH (ref 70–99)

## 2011-07-21 MED ORDER — GLIPIZIDE 5 MG PO TABS
5.0000 mg | ORAL_TABLET | Freq: Two times a day (BID) | ORAL | Status: DC
  Administered 2011-07-21 – 2011-07-22 (×2): 5 mg via ORAL
  Filled 2011-07-21 (×2): qty 1

## 2011-07-21 MED ORDER — SODIUM CHLORIDE 0.9 % IV SOLN: INTRAVENOUS | Status: DC

## 2011-07-21 MED ORDER — INSULIN GLARGINE 100 UNIT/ML ~~LOC~~ SOLN
15.0000 [IU] | Freq: Every day | SUBCUTANEOUS | Status: DC
  Administered 2011-07-21: 15 [IU] via SUBCUTANEOUS

## 2011-07-21 MED ORDER — INSULIN ASPART 100 UNIT/ML ~~LOC~~ SOLN
0.0000 [IU] | Freq: Three times a day (TID) | SUBCUTANEOUS | Status: DC
  Administered 2011-07-22: 9 [IU] via SUBCUTANEOUS

## 2011-07-21 MED ORDER — INSULIN ASPART 100 UNIT/ML ~~LOC~~ SOLN
0.0000 [IU] | Freq: Three times a day (TID) | SUBCUTANEOUS | Status: DC
  Administered 2011-07-21: 5 [IU] via SUBCUTANEOUS

## 2011-07-21 MED ORDER — LINAGLIPTIN 5 MG PO TABS
5.0000 mg | ORAL_TABLET | Freq: Every evening | ORAL | Status: DC
  Administered 2011-07-21: 5 mg via ORAL
  Filled 2011-07-21 (×2): qty 1

## 2011-07-21 MED ORDER — INSULIN ASPART 100 UNIT/ML ~~LOC~~ SOLN
0.0000 [IU] | Freq: Every day | SUBCUTANEOUS | Status: DC
  Administered 2011-07-21: 2 [IU] via SUBCUTANEOUS

## 2011-07-21 MED ORDER — POTASSIUM CHLORIDE IN NACL 40-0.9 MEQ/L-% IV SOLN
INTRAVENOUS | Status: AC
  Filled 2011-07-21: qty 3000

## 2011-07-21 MED ORDER — DEXTROSE-NACL 5-0.45 % IV SOLN
INTRAVENOUS | Status: DC
  Administered 2011-07-21: 14:00:00 via INTRAVENOUS

## 2011-07-21 MED ORDER — POTASSIUM CHLORIDE CRYS ER 20 MEQ PO TBCR
40.0000 meq | EXTENDED_RELEASE_TABLET | Freq: Two times a day (BID) | ORAL | Status: AC
  Administered 2011-07-21 (×2): 40 meq via ORAL
  Filled 2011-07-21 (×2): qty 2

## 2011-07-21 MED ORDER — METFORMIN HCL ER 500 MG PO TB24
1000.0000 mg | ORAL_TABLET | Freq: Every day | ORAL | Status: DC
  Filled 2011-07-21 (×2): qty 2

## 2011-07-21 MED ORDER — INSULIN ASPART 100 UNIT/ML ~~LOC~~ SOLN: 0.0000 [IU] | Freq: Every day | SUBCUTANEOUS | Status: DC

## 2011-07-21 MED ORDER — POTASSIUM CHLORIDE IN NACL 40-0.9 MEQ/L-% IV SOLN
INTRAVENOUS | Status: DC
  Administered 2011-07-21: 125 mL/h via INTRAVENOUS
  Administered 2011-07-22: via INTRAVENOUS
  Filled 2011-07-21 (×6): qty 1000

## 2011-07-21 MED ORDER — SODIUM CHLORIDE 0.9 % IV SOLN
INTRAVENOUS | Status: DC
  Administered 2011-07-21: 10.3 [IU]/h via INTRAVENOUS
  Filled 2011-07-21: qty 1

## 2011-07-21 MED ORDER — SIMVASTATIN 20 MG PO TABS
20.0000 mg | ORAL_TABLET | Freq: Every day | ORAL | Status: DC
  Administered 2011-07-21 – 2011-07-23 (×3): 20 mg via ORAL
  Filled 2011-07-21 (×3): qty 1

## 2011-07-21 MED ORDER — SUMATRIPTAN SUCCINATE 100 MG PO TABS
100.0000 mg | ORAL_TABLET | ORAL | Status: DC | PRN
  Filled 2011-07-21: qty 1

## 2011-07-21 MED ORDER — LEVOTHYROXINE SODIUM 25 MCG PO TABS
50.0000 ug | ORAL_TABLET | Freq: Every day | ORAL | Status: DC
  Administered 2011-07-21 – 2011-07-24 (×4): 50 ug via ORAL
  Filled 2011-07-21 (×4): qty 2

## 2011-07-21 NOTE — Progress Notes (Signed)
Subjective: Patient this morning was complaining of fatigue. Repeat lab studies noted that she was still in some acidosis and she was restarted on insulin drip plus D5 normal saline fluid. Followup lab work in early afternoon noted resolution of anion gap down to 10. Insulin drip now been discontinued and changed over to subcutaneous insulin. Patient herself complains of a mild headache.  Objective: Weight change:   Intake/Output Summary (Last 24 hours) at 07/21/11 1509 Last data filed at 07/21/11 1436  Gross per 24 hour  Intake 1495.04 ml  Output   1450 ml  Net  45.04 ml   Filed Vitals:   07/21/11 0600  BP: 98/62  Pulse: 89  Temp:   Resp: 21   general: Alert and oriented x3, fatigue, looks about stated age, mild distress secondary to headache Cardiovascular: Regular rate and rhythm, S1-S2 Lungs:" Bilaterally Abdomen: Soft, nontender, nondistended, hypoactive bowel sounds Extremity: No clubbing or cyanosis or edema.   Lab Results: Basic Metabolic Panel:  Basename 07/21/11 1251 07/21/11 0810  NA 134* 133*  K 2.9* 3.2*  CL 105 105  CO2 19 12*  GLUCOSE 228* 312*  BUN 16 15  CREATININE 0.67 0.59  CALCIUM 9.1 8.8  MG -- --  PHOS -- --   Liver Function Tests:  [facility] 07/20/11 0845  AST 17  ALT 17  ALKPHOS 140*  BILITOT 0.1*  PROT 7.6  ALBUMIN 4.0    Basename 07/20/11 0845  LIPASE 67*  AMYLASE --   CBC:  Basename 07/21/11 0155 07/20/11 1949  WBC 15.1* 20.7*  NEUTROABS -- --  HGB 10.9* 11.1*  HCT 34.0* 37.4  MCV 65.6* 68.6*  PLT 209 261   Cardiac Enzymes:  Basename 07/20/11 1148  CKTOTAL 58  CKMB 2.6  CKMBINDEX --  TROPONINI <0.30   CBG:  Basename 07/21/11 1343 07/21/11 1240 07/21/11 1131 07/21/11 1025 07/21/11 0928 07/21/11 0741  GLUCAP 197* 228* 293* 403* 298* 253*    Medications: Scheduled Meds:   . glipiZIDE  5 mg Oral BID AC  . influenza  inactive virus vaccine  0.5 mL Intramuscular Tomorrow-1000  . insulin aspart  0-5 Units  Subcutaneous QHS  . insulin aspart  0-9 Units Subcutaneous TID WC  . insulin aspart  10 Units Subcutaneous Once  . insulin glargine  15 Units Subcutaneous QHS  . levothyroxine  50 mcg Oral Daily  . linagliptin  5 mg Oral Daily  . metformin  1,000 mg Oral Q breakfast  . pneumococcal 23 valent vaccine  0.5 mL Intramuscular Tomorrow-1000  . potassium chloride  10 mEq Intravenous Q1H  . potassium chloride  40 mEq Oral BID  . simvastatin  20 mg Oral q1800  . sodium bicarbonate  50 mEq Intravenous Once  . sodium bicarbonate  50 mEq Intravenous Once  . DISCONTD: sodium chloride   Intravenous STAT  . DISCONTD: insulin aspart  0-5 Units Subcutaneous QHS  . DISCONTD: insulin aspart  0-9 Units Subcutaneous TID WC  . DISCONTD: sodium bicarbonate  5 mL Intravenous Once   Continuous Infusions:   . 0.9 % NaCl with KCl 40 mEq / L    . DISCONTD: sodium chloride    . DISCONTD: sodium chloride    . DISCONTD: sodium chloride    . DISCONTD: sodium chloride    . DISCONTD: dextrose 5 % and 0.45% NaCl    . DISCONTD: dextrose 5 % and 0.45% NaCl 100 mL (07/21/11 0930)  . DISCONTD: dextrose 5 % and 0.45% NaCl 100 mL/hr at  07/21/11 1346  . DISCONTD: insulin (NOVOLIN-R) infusion 12.3 Units/hr (07/20/11 1524)  . DISCONTD: insulin (NOVOLIN-R) infusion    . DISCONTD: insulin (NOVOLIN-R) infusion 5.8 Units/hr (07/21/11 1436)   PRN Meds:.dextrose, SUMAtriptan, SUMAtriptan, DISCONTD: dextrose  Assessment/Plan: Patient Active Hospital Problem List: DKA, type 2 (07/20/2011)  finally resolved. Change patient back to by mouth medications plus her on home doses of insulin. Continue to monitor. Cause of DKA still not entirely clear. Given improving white blood cell count likely secondary to stress margination.  HYPOTHYROIDISM (08/24/2010)  resuming by mouth Synthroid.  DM (diabetes mellitus), type 2, uncontrolled (08/24/2010)  awaiting A1c to determine about control.  HYPERCHOLESTEROLEMIA (08/24/2010)  resuming by  mouth statin.  MIGRAINE HEADACHE (08/24/2010)  resuming when necessary Imitrex.  Leukocytosis (07/20/2011)  see above. He be more stress margination.  Hypokalemia (07/21/2011)  replacing with oral medication plus potassium and IV fluids.   LOS: 1 day   Germani Gavilanes K 07/21/2011, 3:09 PM

## 2011-07-22 ENCOUNTER — Inpatient Hospital Stay (HOSPITAL_COMMUNITY): Payer: 59

## 2011-07-22 ENCOUNTER — Other Ambulatory Visit: Payer: Self-pay

## 2011-07-22 DIAGNOSIS — K5900 Constipation, unspecified: Secondary | ICD-10-CM | POA: Diagnosis present

## 2011-07-22 LAB — GLUCOSE, CAPILLARY
Glucose-Capillary: 400 mg/dL — ABNORMAL HIGH (ref 70–99)
Glucose-Capillary: 307 mg/dL — ABNORMAL HIGH (ref 70–99)
Glucose-Capillary: 220 mg/dL — ABNORMAL HIGH (ref 70–99)
Glucose-Capillary: 164 mg/dL — ABNORMAL HIGH (ref 70–99)
Glucose-Capillary: 125 mg/dL — ABNORMAL HIGH (ref 70–99)
Glucose-Capillary: 117 mg/dL — ABNORMAL HIGH (ref 70–99)
Glucose-Capillary: 110 mg/dL — ABNORMAL HIGH (ref 70–99)
Glucose-Capillary: 136 mg/dL — ABNORMAL HIGH (ref 70–99)

## 2011-07-22 LAB — BASIC METABOLIC PANEL
BUN: 11 mg/dL (ref 6–23)
BUN: 10 mg/dL (ref 6–23)
CO2: 10 mEq/L — CL (ref 19–32)
Chloride: 105 mEq/L (ref 96–112)
GFR calc Af Amer: >90 mL/min (ref >90)
GFR calc non Af Amer: >90 mL/min (ref >90)
GFR calc non Af Amer: >90 mL/min (ref >90)
GFR calc non Af Amer: >90 mL/min (ref >90)
Glucose, Bld: 399 mg/dL — ABNORMAL HIGH (ref 70–99)
Glucose, Bld: 319 mg/dL — ABNORMAL HIGH (ref 70–99)
Potassium: 4.6 mEq/L (ref 3.5–5.1)
Potassium: 3.9 mEq/L (ref 3.5–5.1)
Potassium: 2.9 mEq/L — ABNORMAL LOW (ref 3.5–5.1)
Sodium: 137 mEq/L (ref 135–145)
Sodium: 131 mEq/L — ABNORMAL LOW (ref 135–145)

## 2011-07-22 LAB — CBC
HCT: 34.7 % — ABNORMAL LOW (ref 36.0–46.0)
Hemoglobin: 11.3 g/dL — ABNORMAL LOW (ref 12.0–15.0)
Hemoglobin: 11.7 g/dL — ABNORMAL LOW (ref 12.0–15.0)
MCH: 20.9 pg — ABNORMAL LOW (ref 26.0–34.0)
MCH: 20.7 pg — ABNORMAL LOW (ref 26.0–34.0)
MCHC: 32.6 g/dL (ref 30.0–36.0)
MCV: 64.6 fL — ABNORMAL LOW (ref 78.0–100.0)
RBC: 5.65 MIL/uL — ABNORMAL HIGH (ref 3.87–5.11)

## 2011-07-22 LAB — URINE MICROSCOPIC-ADD ON

## 2011-07-22 LAB — CULTURE, BLOOD (ROUTINE X 2)
Culture: NO GROWTH
Culture: NO GROWTH

## 2011-07-22 LAB — URINALYSIS, ROUTINE W REFLEX MICROSCOPIC
Glucose, UA: >1000 mg/dL — AB
Ketones, ur: >80 mg/dL — AB
Leukocytes, UA: NEGATIVE
pH: 5.5 (ref 5.0–8.0)

## 2011-07-22 LAB — INFLUENZA PANEL BY PCR (TYPE A & B): H1N1 flu by pcr: NOT DETECTED

## 2011-07-22 MED ORDER — SUMATRIPTAN SUCCINATE 50 MG PO TABS
100.0000 mg | ORAL_TABLET | ORAL | Status: DC | PRN
  Administered 2011-07-23 – 2011-07-24 (×2): 100 mg via ORAL
  Filled 2011-07-22 (×2): qty 2

## 2011-07-22 MED ORDER — SODIUM CHLORIDE 0.9 % IJ SOLN
INTRAMUSCULAR | Status: AC
  Filled 2011-07-22: qty 3

## 2011-07-22 MED ORDER — FLEET ENEMA 7-19 GM/118ML RE ENEM: 1.0000 | ENEMA | Freq: Once | RECTAL | Status: DC

## 2011-07-22 MED ORDER — DEXTROSE 50 % IV SOLN: 25.0000 mL | INTRAVENOUS | Status: DC | PRN

## 2011-07-22 MED ORDER — ENOXAPARIN SODIUM 30 MG/0.3ML ~~LOC~~ SOLN
30.0000 mg | SUBCUTANEOUS | Status: DC
  Administered 2011-07-22 – 2011-07-23 (×2): 30 mg via SUBCUTANEOUS
  Filled 2011-07-22 (×2): qty 0.3

## 2011-07-22 MED ORDER — INSULIN ASPART 100 UNIT/ML ~~LOC~~ SOLN
0.0000 [IU] | Freq: Three times a day (TID) | SUBCUTANEOUS | Status: DC
  Administered 2011-07-23: 7 [IU] via SUBCUTANEOUS
  Administered 2011-07-23: 20 [IU] via SUBCUTANEOUS
  Administered 2011-07-24: 15 [IU] via SUBCUTANEOUS

## 2011-07-22 MED ORDER — METRONIDAZOLE IN NACL 5-0.79 MG/ML-% IV SOLN
500.0000 mg | Freq: Three times a day (TID) | INTRAVENOUS | Status: DC
  Administered 2011-07-22 (×2): 500 mg via INTRAVENOUS
  Filled 2011-07-22 (×4): qty 100

## 2011-07-22 MED ORDER — INSULIN REGULAR HUMAN 100 UNIT/ML IJ SOLN
INTRAMUSCULAR | Status: DC
  Filled 2011-07-22 (×2): qty 1

## 2011-07-22 MED ORDER — DEXTROSE 5 % IV SOLN
1.0000 g | INTRAVENOUS | Status: DC
  Administered 2011-07-22: 1 g via INTRAVENOUS
  Filled 2011-07-22 (×2): qty 10

## 2011-07-22 MED ORDER — DOCUSATE SODIUM 100 MG PO CAPS
100.0000 mg | ORAL_CAPSULE | Freq: Two times a day (BID) | ORAL | Status: DC
  Administered 2011-07-22 – 2011-07-24 (×5): 100 mg via ORAL
  Filled 2011-07-22 (×5): qty 1

## 2011-07-22 MED ORDER — DEXTROSE 5 % IV SOLN
500.0000 mg | INTRAVENOUS | Status: DC
  Filled 2011-07-22 (×2): qty 500

## 2011-07-22 MED ORDER — MORPHINE SULFATE 2 MG/ML IJ SOLN
2.0000 mg | INTRAMUSCULAR | Status: DC | PRN
  Administered 2011-07-22: 2 mg via INTRAVENOUS
  Filled 2011-07-22: qty 1

## 2011-07-22 MED ORDER — INSULIN GLARGINE 100 UNIT/ML ~~LOC~~ SOLN
20.0000 [IU] | Freq: Every day | SUBCUTANEOUS | Status: DC
  Administered 2011-07-22 – 2011-07-23 (×2): 20 [IU] via SUBCUTANEOUS

## 2011-07-22 MED ORDER — CIPROFLOXACIN IN D5W 400 MG/200ML IV SOLN
400.0000 mg | Freq: Two times a day (BID) | INTRAVENOUS | Status: DC
  Administered 2011-07-22: 400 mg via INTRAVENOUS
  Filled 2011-07-22 (×2): qty 200

## 2011-07-22 MED ORDER — DEXTROSE-NACL 5-0.45 % IV SOLN
INTRAVENOUS | Status: DC
  Administered 2011-07-22: 125 mL via INTRAVENOUS

## 2011-07-22 MED ORDER — ONDANSETRON HCL 4 MG/2ML IJ SOLN
4.0000 mg | Freq: Three times a day (TID) | INTRAMUSCULAR | Status: DC | PRN
  Administered 2011-07-22 (×2): 4 mg via INTRAVENOUS
  Filled 2011-07-22: qty 2

## 2011-07-22 MED ORDER — INSULIN ASPART 100 UNIT/ML ~~LOC~~ SOLN: 0.0000 [IU] | Freq: Every day | SUBCUTANEOUS | Status: DC

## 2011-07-22 MED ORDER — SODIUM CHLORIDE 0.9 % IV SOLN: INTRAVENOUS | Status: DC

## 2011-07-22 MED ORDER — POLYETHYLENE GLYCOL 3350 17 G PO PACK
17.0000 g | PACK | Freq: Every day | ORAL | Status: DC
  Administered 2011-07-22 – 2011-07-24 (×3): 17 g via ORAL
  Filled 2011-07-22 (×3): qty 1

## 2011-07-22 MED ORDER — METOCLOPRAMIDE HCL 5 MG/ML IJ SOLN
5.0000 mg | Freq: Four times a day (QID) | INTRAMUSCULAR | Status: DC
Start: 2011-07-22 — End: 2011-07-24
  Administered 2011-07-22 – 2011-07-24 (×7): 5 mg via INTRAVENOUS
  Filled 2011-07-22 (×8): qty 2

## 2011-07-22 MED ORDER — POTASSIUM CHLORIDE CRYS ER 20 MEQ PO TBCR
40.0000 meq | EXTENDED_RELEASE_TABLET | Freq: Two times a day (BID) | ORAL | Status: DC
  Administered 2011-07-22: 40 meq via ORAL
  Filled 2011-07-22: qty 2

## 2011-07-22 MED ORDER — ONDANSETRON HCL 4 MG/2ML IJ SOLN
INTRAMUSCULAR | Status: AC
  Administered 2011-07-22: 4 mg via INTRAVENOUS
  Filled 2011-07-22: qty 2

## 2011-07-22 NOTE — Progress Notes (Signed)
CRITICAL VALUE ALERT  Critical value received:  CO2=10  Date of notification:  07/22/11  Time of notification:  0643  Critical value read back:yes  Nurse who received alert:  Jinny Sanders, RN  MD notified (1st page):  Dr. Vania Rea  Time of first page:  [phone]  MD notified (2nd page):  Time of second page:  Responding MD:  Dr. Vania Rea  Time MD responded:  [phone]  No new orders given at this time.

## 2011-07-22 NOTE — Progress Notes (Signed)
Subjective: Patient had a rough night. Was complaining of abdominal pain and several episodes of vomiting. He was noted this morning that she was back in acidosis with an anion gap of 22. She received medicine for pain and nausea and was feeling better.  Objective: Weight change: 0.668 kg (1 lb 7.6 oz)  Intake/Output Summary (Last 24 hours) at 07/22/11 1527 Last data filed at 07/22/11 1220  Gross per 24 hour  Intake 2347.16 ml  Output   5300 ml  Net -2952.84 ml   Filed Vitals:   07/22/11 1200  BP: 112/64  Pulse: 80  Temp: 98 F (36.7 C)  Resp: 19   general: Alert and oriented x3, fatigue, looks about stated age, secondary to nausea and vomiting Cardiovascular: Regular rate and rhythm, S1-S2 Lungs:" Bilaterally Abdomen: Soft, nontender, nondistended, hypoactive bowel sounds Extremity: No clubbing or cyanosis or edema.  Lab Results: Basic Metabolic Panel:  Basename 07/22/11 1003 07/22/11 0502  NA 137 138  Schneider 3.9 4.6  CL 107 106  CO2 10* 10*  GLUCOSE 319* 399*  BUN 13 11  CREATININE 0.59 0.53  CALCIUM 9.5 9.5  MG -- --  PHOS -- --   Liver Function Tests:  [facility] 07/20/11 0845  AST 17  ALT 17  ALKPHOS 140*  BILITOT 0.1*  PROT 7.6  ALBUMIN 4.0    Basename 07/20/11 0845  LIPASE 67*  AMYLASE --   CBC:  Basename 07/22/11 1003 07/22/11 0502  WBC 11.1* 11.2*  NEUTROABS -- --  HGB 11.7* 11.3*  HCT 36.5 34.7*  MCV 64.6* 64.3*  PLT 227 222   Cardiac Enzymes:  Basename 07/20/11 1148  CKTOTAL 58  CKMB 2.6  CKMBINDEX --  TROPONINI <0.30   CBG:  Basename 07/22/11 1515 07/22/11 1411 07/22/11 1319 07/22/11 1216 07/22/11 1117 07/22/11 1006  GLUCAP 164* 207* 219* 220* 287* 307*   It was noted that her hemoglobin A1c was 15.9, consistent with an average sugar of 401.  Medications: Scheduled Meds:    . ciprofloxacin  400 mg Intravenous Q12H  . docusate sodium  100 mg Oral BID  . enoxaparin  30 mg Subcutaneous Q24H  . levothyroxine  50 mcg Oral Daily    . metoCLOPramide (REGLAN) injection  5 mg Intravenous Q6H  . metronidazole  500 mg Intravenous Q8H  . pneumococcal 23 valent vaccine  0.5 mL Intramuscular Tomorrow-1000  . polyethylene glycol  17 g Oral Daily  . potassium chloride  40 mEq Oral BID  . simvastatin  20 mg Oral q1800  . sodium chloride      . sodium chloride      . DISCONTD: azithromycin  500 mg Intravenous Q24H  . DISCONTD: cefTRIAXone (ROCEPHIN)  IV  1 g Intravenous Q24H  . DISCONTD: glipiZIDE  5 mg Oral BID AC  . DISCONTD: influenza  inactive virus vaccine  0.5 mL Intramuscular Tomorrow-1000  . DISCONTD: insulin aspart  0-5 Units Subcutaneous QHS  . DISCONTD: insulin aspart  0-9 Units Subcutaneous TID WC  . DISCONTD: insulin aspart  10 Units Subcutaneous Once  . DISCONTD: insulin glargine  15 Units Subcutaneous QHS  . DISCONTD: linagliptin  5 mg Oral QPM  . DISCONTD: metformin  1,000 mg Oral Q breakfast   Continuous Infusions:    . sodium chloride    . dextrose 5 % and 0.45% NaCl 125 mL/hr at 07/22/11 1000  . insulin (NOVOLIN-R) infusion 8 Units/hr (07/22/11 1220)  . DISCONTD: 0.9 % NaCl with KCl 40 mEq / L 125  mL/hr at 07/22/11 0600   PRN Meds:.dextrose, dextrose, morphine injection, ondansetron (ZOFRAN) IV, SUMAtriptan, SUMAtriptan  Assessment/Plan: Patient Active Hospital Problem List: DKA, type 2 (07/20/2011)  she is back in acidosis. I had a long conversation with her and her husband. Clearly there appears to be a underlying factor which is leading to her elevated blood sugars and recurrent acidosis. I repeated her chest x-ray, urinalysis and also order blood cultures. Her chest x-ray and urinalysis were unremarkable. However I also suspected that she may have secondary gastroparesis and worsening constipation which may lead to prolonged stool and bacterial translocation in the gut. Her abdominal x-ray did note moderate stool throughout her colon. I started her back on IV insulin plus IV fluids and the DKA  protocol. I have also started her on Cipro and Flagyl for gut coverage. Have also started her on a bowel regimen of IV Reglan, Colace and MiraLAX. If she does not respond to this, will add a fleets enema. When she is back to normal, would recommend a daily regimen of Colace 100 twice a day, Reglan and for every time that she does not have a bowel movement every 24 hours, when necessary MiraLAX.  Suspected diabetic gastroparesis: See above.  HYPOTHYROIDISM (08/24/2010)  resuming by mouth Synthroid, yesterday  DM (diabetes mellitus), type 2, uncontrolled (08/24/2010)  noted A1c. According to the patient's husband, she is compliant with the Lantus and she is a number of other medications. However he will also be May 1 that she does not check her sugars frequently. There may be some role of noncompliance and I suspect that in the past few months her chronic constipation and continued bacterial translocation he also be playing a role in this. Regardless now that we know greatly 1C, awake diabetes education and make further recommendations to changing her medicine regimen. She also has been followed by her primary care physician for her diabetes, but is planning on seeing Dr. Fransico Him as an outpatient in the coming weeks.   HYPERCHOLESTEROLEMIA (08/24/2010)  resuming by mouth statin.  MIGRAINE HEADACHE (08/24/2010)  resuming when necessary Imitrex.  Leukocytosis (07/20/2011)  mostly stress margination, although I suspect her infection may have had a mild role in this. Regardless it is improved.  Hypokalemia (07/21/2011)  replacing with oral medication plus potassium and IV fluids.   LOS: 2 days   Melissa Schneider 07/22/2011, 3:27 PM

## 2011-07-23 ENCOUNTER — Encounter (HOSPITAL_COMMUNITY): Payer: Self-pay | Admitting: Internal Medicine

## 2011-07-23 DIAGNOSIS — E871 Hypo-osmolality and hyponatremia: Secondary | ICD-10-CM | POA: Diagnosis present

## 2011-07-23 DIAGNOSIS — E1143 Type 2 diabetes mellitus with diabetic autonomic (poly)neuropathy: Secondary | ICD-10-CM

## 2011-07-23 DIAGNOSIS — D649 Anemia, unspecified: Secondary | ICD-10-CM | POA: Diagnosis present

## 2011-07-23 DIAGNOSIS — D509 Iron deficiency anemia, unspecified: Secondary | ICD-10-CM | POA: Diagnosis present

## 2011-07-23 HISTORY — DX: Iron deficiency anemia, unspecified: D50.9

## 2011-07-23 HISTORY — DX: Anemia, unspecified: D64.9

## 2011-07-23 HISTORY — DX: Type 2 diabetes mellitus with diabetic autonomic (poly)neuropathy: E11.43

## 2011-07-23 LAB — GLUCOSE, CAPILLARY
Glucose-Capillary: 229 mg/dL — ABNORMAL HIGH (ref 70–99)
Glucose-Capillary: 223 mg/dL — ABNORMAL HIGH (ref 70–99)
Glucose-Capillary: 371 mg/dL — ABNORMAL HIGH (ref 70–99)

## 2011-07-23 LAB — BASIC METABOLIC PANEL
BUN: 5 mg/dL — ABNORMAL LOW (ref 6–23)
Creatinine, Ser: 0.5 mg/dL (ref 0.50–1.10)
GFR calc Af Amer: >90 mL/min (ref >90)
GFR calc non Af Amer: >90 mL/min (ref >90)
Potassium: 3 mEq/L — ABNORMAL LOW (ref 3.5–5.1)

## 2011-07-23 LAB — CBC
HCT: 33.6 % — ABNORMAL LOW (ref 36.0–46.0)
MCHC: 33.6 g/dL (ref 30.0–36.0)
MCV: 63.4 fL — ABNORMAL LOW (ref 78.0–100.0)
Platelets: 203 10*3/uL (ref 150–400)
RDW: 16.2 % — ABNORMAL HIGH (ref 11.5–15.5)

## 2011-07-23 MED ORDER — SODIUM CHLORIDE 0.9 % IV SOLN: INTRAVENOUS | Status: DC

## 2011-07-23 MED ORDER — POTASSIUM CHLORIDE CRYS ER 20 MEQ PO TBCR: 40.0000 meq | EXTENDED_RELEASE_TABLET | Freq: Three times a day (TID) | ORAL | Status: DC

## 2011-07-23 MED ORDER — INSULIN ASPART 100 UNIT/ML ~~LOC~~ SOLN
8.0000 [IU] | Freq: Three times a day (TID) | SUBCUTANEOUS | Status: DC
  Administered 2011-07-24 (×2): 8 [IU] via SUBCUTANEOUS

## 2011-07-23 MED ORDER — METRONIDAZOLE 500 MG PO TABS
250.0000 mg | ORAL_TABLET | Freq: Three times a day (TID) | ORAL | Status: DC
  Administered 2011-07-23 – 2011-07-24 (×4): 250 mg via ORAL
  Filled 2011-07-23 (×4): qty 1

## 2011-07-23 MED ORDER — GLIPIZIDE 5 MG PO TABS
5.0000 mg | ORAL_TABLET | Freq: Two times a day (BID) | ORAL | Status: DC
Start: 2011-07-23 — End: 2011-07-23
  Administered 2011-07-23: 5 mg via ORAL
  Filled 2011-07-23: qty 1

## 2011-07-23 MED ORDER — CIPROFLOXACIN HCL 250 MG PO TABS
250.0000 mg | ORAL_TABLET | Freq: Two times a day (BID) | ORAL | Status: DC
  Administered 2011-07-23 – 2011-07-24 (×3): 250 mg via ORAL
  Filled 2011-07-23 (×3): qty 1

## 2011-07-23 MED ORDER — POTASSIUM CHLORIDE CRYS ER 20 MEQ PO TBCR
40.0000 meq | EXTENDED_RELEASE_TABLET | Freq: Three times a day (TID) | ORAL | Status: DC
  Administered 2011-07-23 – 2011-07-24 (×3): 40 meq via ORAL
  Filled 2011-07-23 (×2): qty 2
  Filled 2011-07-23: qty 1
  Filled 2011-07-23: qty 2

## 2011-07-23 MED ORDER — ENOXAPARIN SODIUM 40 MG/0.4ML ~~LOC~~ SOLN
40.0000 mg | SUBCUTANEOUS | Status: DC
  Administered 2011-07-24: 40 mg via SUBCUTANEOUS
  Filled 2011-07-23: qty 0.4

## 2011-07-23 MED ORDER — ZOLPIDEM TARTRATE 5 MG PO TABS
5.0000 mg | ORAL_TABLET | Freq: Once | ORAL | Status: DC
Start: 2011-07-23 — End: 2011-07-24
  Filled 2011-07-23: qty 1

## 2011-07-23 NOTE — Progress Notes (Signed)
Subjective: The patient says that she no longer has abnormal pain, nausea, and vomiting. She was given an enema last night and it was successful. She had 2 very large bowel movements.  Objective: Vital signs in last 24 hours: Filed Vitals:   07/23/11 0400 07/23/11 0500 07/23/11 0600 07/23/11 0700  BP: 115/64 105/57 106/57 104/63  Pulse: 78 81 76 81  Temp: 98.5 F (36.9 C)     TempSrc: Oral     Resp: 18 16 17 21   Height:      Weight:  59.4 kg (130 lb 15.3 oz)    SpO2: 99% 99% 99% 99%    Intake/Output Summary (Last 24 hours) at 07/23/11 0840 Last data filed at 07/23/11 0600  Gross per 24 hour  Intake 3344.63 ml  Output   1602 ml  Net 1742.63 ml    Weight change: 4.3 kg (9 lb 7.7 oz)  Physical exam: Lungs: Decreased breath sounds in the bases otherwise clear. Heart: S1, S2, with a soft systolic murmur. Abdomen: Positive bowel sounds, soft, nontender, nondistended. Extremities: No pedal edema. Neurologic: She is alert and oriented x3. Cranial nerves II through XII are intact.  Lab Results: Basic Metabolic Panel:  Basename 07/22/11 1553 07/22/11 1003  NA 131* 137  K 2.9* 3.9  CL 105 107  CO2 20 10*  GLUCOSE 143* 319*  BUN 10 13  CREATININE 0.49* 0.59  CALCIUM 9.2 9.5  MG -- --  PHOS -- --   Liver Function Tests:  [facility] 07/20/11 0845  AST 17  ALT 17  ALKPHOS 140*  BILITOT 0.1*  PROT 7.6  ALBUMIN 4.0    Basename 07/20/11 0845  LIPASE 67*  AMYLASE --   No results found for this basename: AMMONIA:2 in the last 72 hours CBC:  Basename 07/22/11 1003 07/22/11 0502  WBC 11.1* 11.2*  NEUTROABS -- --  HGB 11.7* 11.3*  HCT 36.5 34.7*  MCV 64.6* 64.3*  PLT 227 222   Cardiac Enzymes:  Basename 07/20/11 1148  CKTOTAL 58  CKMB 2.6  CKMBINDEX --  TROPONINI <0.30   BNP: No results found for this basename: PROBNP:3 in the last 72 hours D-Dimer: No results found for this basename: DDIMER:2 in the last 72 hours CBG:  Basename 07/23/11 0750 07/23/11  0606 07/23/11 0219 07/22/11 2157 07/22/11 2033 07/22/11 1924  GLUCAP 223* 229* 260* 167* 141* 136*   Hemoglobin A1C:  Basename 07/20/11 1949  HGBA1C 15.9*   Fasting Lipid Panel: No results found for this basename: CHOL,HDL,LDLCALC,TRIG,CHOLHDL,LDLDIRECT in the last 72 hours Thyroid Function Tests: No results found for this basename: TSH,T4TOTAL,FREET4,T3FREE,THYROIDAB in the last 72 hours Anemia Panel: No results found for this basename: VITAMINB12,FOLATE,FERRITIN,TIBC,IRON,RETICCTPCT in the last 72 hours Coagulation: No results found for this basename: LABPROT:2,INR:2 in the last 72 hours Urine Drug Screen: Drugs of Abuse  No results found for this basename: labopia, cocainscrnur, labbenz, amphetmu, thcu, labbarb    Alcohol Level: No results found for this basename: ETH:2 in the last 72 hours   Micro: Recent Results (from the past 240 hour(s))  MRSA PCR SCREENING     Status: Normal   Collection Time   07/20/11  4:45 PM      Component Value Range Status Comment   MRSA by PCR NEGATIVE  NEGATIVE  Final   CULTURE, BLOOD (ROUTINE X 2)     Status: Normal (Preliminary result)   Collection Time   07/22/11  9:14 AM      Component Value Range Status Comment  Specimen Description BLOOD RIGHT HAND   Final    Special Requests     Final    Value: BOTTLES DRAWN AEROBIC AND ANAEROBIC 8CC AEROBIC,6CC ANAEROBIC   Culture PENDING   Incomplete    Report Status PENDING   Incomplete   CULTURE, BLOOD (ROUTINE X 2)     Status: Normal (Preliminary result)   Collection Time   07/22/11 10:03 AM      Component Value Range Status Comment   Specimen Description BLOOD RIGHT HAND   Final    Special Requests     Final    Value: BOTTLES DRAWN AEROBIC AND ANAEROBIC 8CC EACH BOTTLE   Culture PENDING   Incomplete    Report Status PENDING   Incomplete     Studies/Results: Dg Abd Acute W/chest  07/22/2011  *RADIOLOGY REPORT*  Clinical Data: Recurrent DKA, evaluate for infection  ACUTE ABDOMEN SERIES  (ABDOMEN 2 VIEW & CHEST 1 VIEW)  Comparison: Chest radiograph dated 07/20/2011  Findings: Lungs are clear. No pleural effusion or pneumothorax.  Cardiomediastinal silhouette is within normal limits.  Nonobstructive bowel gas pattern.  Moderate stool throughout the colon.  No evidence of free air under the diaphragm on the upright view.  Visualized osseous structures are within normal limits.  IMPRESSION: No evidence of acute cardiopulmonary disease.  No evidence of small bowel obstruction or free air.  Moderate stool throughout the colon.  Original Report Authenticated By: Charline Bills, M.D.    Medications: I have reviewed the patient's current medications.  Assessment: Principal Problem:  *DKA, type 2 Active Problems:  HYPOTHYROIDISM  DM (diabetes mellitus), type 2, uncontrolled  HYPERCHOLESTEROLEMIA  MIGRAINE HEADACHE  Leukocytosis  Hypokalemia  Constipation  Hyponatremia  Diabetic gastroparesis  Anemia  Microcytic anemia  1. Type 2 diabetes mellitus with DKA. Basic metabolic panel results are pending. It appeared that she was out of DKA yesterday, and therefore, the insulin drip was discontinued. She is now on resistant scale sliding scale NovoLog and Lantus. She is on glipizide, Lantus, metformin, and Januvia chronically at home. Will restart glipizide. Will hold on restarting metformin and Januvia. Will consult endocrinologist Dr. Fransico Him for his recommendations. Of note, the patient had an appointment with Dr. Fransico Him today in his office. Her hemoglobin A1c is 15.9, indicating very poor outpatient control.  Prolonged constipation/suspected diabetic gastroparesis. She was given an enema with success last night. She was started on rate plan, MiraLAX, and Colace by Dr. Rito Ehrlich on. Also, Cipro and Flagyl were started empirically. These will be transitioned to by mouth today.   Hypokalemia. This is being repleted with potassium chloride orally. Hyponatremia. Likely secondary to hypotonic IV  fluids from yesterday. Basic metabolic panel results are currently pending. Next  Leukocytosis. Likely reactive from the DKA. CBC results are pending.  Microcytic anemia. This will need to be evaluated further in the outpatient setting.  Hypothyroidism. Stable on Synthroid.  Hyperlipidemia. Stable on Zocor.   Plan:  1. Will order and check the results of the basic metabolic panel and CBC. We'll followup on lipase and hepatic function panel tomorrow morning.  Will restart glipizide and consult endocrinologist Dr. Fransico Him for further management recommendations on diabetes.  We'll decrease the rate of IV fluids as the patient appears to be well hydrated.  We'll change Flagyl and Cipro to by mouth, both doses decreased.    LOS: 3 days   Melissa Schneider 07/23/2011, 8:40 AM

## 2011-07-23 NOTE — Consult Note (Signed)
  [id number] 

## 2011-07-23 NOTE — Consult Note (Signed)
NAME:  Melissa Schneider, Melissa Schneider             ACCOUNT NO.:  [id number]  MEDICAL RECORD NO.:  [id number]  LOCATION:  IC07                          FACILITY:  [facility]  PHYSICIAN:  Purcell Nails, MD DATE OF BIRTH:  [birth year]  DATE OF CONSULTATION:  07/23/2011 DATE OF DISCHARGE:                                CONSULTATION   REASON FOR CONSULT:  Uncontrolled type 2 diabetes.  HISTORY OF PRESENT ILLNESS:  This is a 56 year old Caucasian female with medical history significant for type 2 diabetes for approximately 3 years.  She also has a medical history of hypothyroidism, hearing difficulty, hypercholesterolemia.  She is currently admitted to [facility] with a complaint of weakness, dizziness, abdominal pain, where she was found to have severe hyperglycemia at 537 associated with low bicarb consistent with diabetic ketoacidosis.  She was put on a glucose stabilizer utilizing IV insulin infusion which helped bring blood sugars near normal levels and profuse IV hydration.  She responded nicely and corrected her hypotension at the same time.  Endocrine consult was obtained to help manage diabetes.  On further interview, the patient states that she was taking glipizide 5 mg 2 times daily, Lantus 20 units nightly, Januvia 100 mg once a day.  She has not been monitoring regularly and she admits that she was not consistent in taking her medications.  Her A1c was found to be 15.9%.  She denies any coronary artery disease, CKD, CVA, and retinopathy.  The patient is willing to treat her diabetes intensively to control and avoid acute and chronic complications.  PAST MEDICAL HISTORY:  As above.  PAST SURGICAL HISTORY:  Nothing remarkable.  MEDICATIONS: 1. Glipizide 5 mg twice a day. 2. Lantus 20 units nightly. 3. Synthroid 50 mcg daily. 4. Losartan 25 mg daily. 5. Metformin 1000 mg daily. 6. Pravastatin 40 mg daily. 7. Januvia 100 mg daily. 8. Sumatriptan 100 mg every 2 hours as needed  for migraines.  ALLERGIES:  She is allergic to LISINOPRIL and possibly for other ACE INHIBITORS which causes coughing.  SOCIAL HISTORY:  Negative for smoking.  No reports of alcohol or drug abuse.  FAMILY HISTORY:  Significant for hypertension.  REVIEW OF SYSTEMS:  At this point, the patient denies any chest pain, shortness of breath.  No bleeding, no headaches.  The rest of the systems have been reviewed and negative.  PHYSICAL EXAMINATION:  GENERAL:  She is alert and oriented x3 in her ICU bed, resting comfortably.  Just ate dinner. VITAL SIGNS:  Her current vital signs include blood pressure 130/73, pulse rate 79, temperature 98.3. HEENT:  Well hydrated.  No icterus, no pallor.  Moist mucous membranes. No JVD and no thyromegaly. CHEST:  Clear to auscultation bilaterally. CARDIOVASCULAR:  Normal S1 and S2.  No murmur.  No gallop.  ABDOMEN: Soft and nontender. EXTREMITIES:  No edema. CNS:  Nonfocal. SKIN:  No rash.  No hyperemia.  Her recent blood work shows sodium 141, chloride 107, potassium 3.0, bicarb 23, BUN 5, creatinine 0.5.  TSH was not measured this time.  Her A1c is 15.9%.  ASSESSMENT: 1. Diabetic ketoacidosis, resolved. 2. Chronically uncontrolled type 2 diabetes with A1c of 15.9%. 3. Hypothyroidism. 4. Hyperlipidemia. 5.  Hypertension. 6. Noncompliance.  PLAN:  The patient is status post IV infusion of insulin with destabilized her glycemia to near normal levels and she is tolerating oral feeding as well as hydration.  We will stop IV insulin therapy and IV bolus of normal saline.  We will initiate nasal bolus insulin using Lantus 20 units nightly and NovoLog 8 units t.i.d., C plus sliding scale.  I decided to discontinue her oral antidiabetic medications include glipizide and Januvia.  She agrees to follow up with me in 1 week to control her diabetes within reasonable period of time. Intensive diabetes and dietary recommendations were given at bedside  to the patient by myself.  She will need diabetes education on monitoring and insulin use.  For this, I will consult diabetes coordinators to bedside.  I will obtain her free T4 and TSH along with her morning labs tomorrow.  She remains at very significant risks for long-term complications of diabetes and she agrees to follow up with me in the office in 1 week.  Her physical presentation does not feet for typical type 2 diabetes.  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  Dear Dr. Sherrie Mustache, thank for the opportunity to participate in the care of this pleasant patient.  I will update you on her followup.          ______________________________ Purcell Nails, MD     GN/MEDQ  D:  07/23/2011  T:  07/23/2011  Job:  [id number]

## 2011-07-24 LAB — GLUCOSE, CAPILLARY: Glucose-Capillary: 346 mg/dL — ABNORMAL HIGH (ref 70–99)

## 2011-07-24 LAB — CBC
HCT: 33.7 % — ABNORMAL LOW (ref 36.0–46.0)
MCH: 21 pg — ABNORMAL LOW (ref 26.0–34.0)
MCHC: 33.8 g/dL (ref 30.0–36.0)
MCV: 62.2 fL — ABNORMAL LOW (ref 78.0–100.0)
RDW: 15.7 % — ABNORMAL HIGH (ref 11.5–15.5)

## 2011-07-24 LAB — HEPATIC FUNCTION PANEL
Albumin: 2.9 g/dL — ABNORMAL LOW (ref 3.5–5.2)
Bilirubin, Direct: 0.1 mg/dL (ref 0.0–0.3)
Indirect Bilirubin: 0.3 mg/dL (ref 0.3–0.9)
Total Bilirubin: 0.4 mg/dL (ref 0.3–1.2)

## 2011-07-24 LAB — BASIC METABOLIC PANEL
CO2: 29 mEq/L (ref 19–32)
Calcium: 9.2 mg/dL (ref 8.4–10.5)
Creatinine, Ser: 0.42 mg/dL — ABNORMAL LOW (ref 0.50–1.10)
Glucose, Bld: 116 mg/dL — ABNORMAL HIGH (ref 70–99)

## 2011-07-24 LAB — TSH: TSH: 4.453 u[IU]/mL (ref 0.350–4.500)

## 2011-07-24 LAB — LIPASE, BLOOD: Lipase: 56 U/L (ref 11–59)

## 2011-07-24 MED ORDER — POLYETHYLENE GLYCOL 3350 17 G PO PACK: 17.0000 g | PACK | Freq: Every day | ORAL | Status: AC

## 2011-07-24 MED ORDER — INSULIN ASPART 100 UNIT/ML ~~LOC~~ SOLN: 8.0000 [IU] | Freq: Three times a day (TID) | SUBCUTANEOUS | Status: DC

## 2011-07-24 MED ORDER — POTASSIUM CHLORIDE CRYS ER 20 MEQ PO TBCR: 20.0000 meq | EXTENDED_RELEASE_TABLET | Freq: Two times a day (BID) | ORAL | Status: DC

## 2011-07-24 MED ORDER — INSULIN ASPART 100 UNIT/ML ~~LOC~~ SOLN: 0.0000 [IU] | Freq: Three times a day (TID) | SUBCUTANEOUS | Status: DC

## 2011-07-24 NOTE — Progress Notes (Signed)
Consult to Diabetes Coordinator received.  Note Hbg A1C was 15.9.  To be discharged today.  Patient was taking Lantus via pen prior to admission.  Stores pens properly in refrigerator, but informed patient she can keep pen currently using at room temperature.  Not doing air shot (prime) prior to injection.  Explained how to do this and explained rationale for adding this behavior.  She indicated understanding.  Explained that the rapid acting insulin pens work the same as the Lantus pen.  Explained that the correction scale would help correct an elevated glucose and the set amount with each meal covers the CHO in meals.  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  Has a meter at home-- no issues identified with that.  To f/u with her endocrinologist next week.  Expressed desire to take better care of herself and readiness to go home.  Her nurse is to reinforce air shot and make sure she understands how to incorporate correction and meal coverage into her diabetes care at home.

## 2011-07-24 NOTE — Discharge Instructions (Signed)
Blood Sugar Monitoring, Adult GLUCOSE METERS FOR SELF-MONITORING OF BLOOD GLUCOSE  It is important to be able to correctly measure your blood sugar (glucose). You can use a blood glucose monitor (a small battery-operated device) to check your glucose level at any time. This allows you and your caregiver to monitor your diabetes and to determine how well your treatment plan is working. The process of monitoring your blood glucose with a glucose meter is called self-monitoring of blood glucose (SMBG). When people with diabetes control their blood sugar, they have better health. To test for glucose with a typical glucose meter, place the disposable strip in the meter. Then place a small sample of blood on the "test strip." The test strip is coated with chemicals that combine with glucose in blood. The meter measures how much glucose is present. The meter displays the glucose level as a number. Several new models can record and store a number of test results. Some models can connect to personal computers to store test results or print them out.  Newer meters are often easier to use than older models. Some meters allow you to get blood from places other than your fingertip. Some new models have automatic timing, error codes, signals, or barcode readers to help with proper adjustment (calibration). Some meters have a large display screen or spoken instructions for people with visual impairments.  INSTRUCTIONS FOR USING GLUCOSE METERS  Wash your hands with soap and warm water, or clean the area with alcohol. Dry your hands completely.   Prick the side of your fingertip with a lancet (a sharp-pointed tool used by hand).   Hold the hand down and gently milk the finger until a small drop of blood appears. Catch the blood with the test strip.   Follow the instructions for inserting the test strip and using the SMBG meter. Most meters require the meter to be turned on and the test strip to be inserted before  applying the blood sample.   Record the test result.   Read the instructions carefully for both the meter and the test strips that go with it. Meter instructions are found in the user manual. Keep this manual to help you solve any problems that may arise. Many meters use "error codes" when there is a problem with the meter, the test strip, or the blood sample on the strip. You will need the manual to understand these error codes and fix the problem.   New devices are available such as laser lancets and meters that can test blood taken from "alternative sites" of the body, other than fingertips. However, you should use standard fingertip testing if your glucose changes rapidly. Also, use standard testing if:   You have eaten, exercised, or taken insulin in the past 2 hours.   You think your glucose is low.   You tend to not feel symptoms of low blood glucose (hypoglycemia).   You are ill or under stress.   Clean the meter as directed by the manufacturer.   Test the meter for accuracy as directed by the manufacturer.   Take your meter with you to your caregiver's office. This way, you can test your glucose in front of your caregiver to make sure you are using the meter correctly. Your caregiver can also take a sample of blood to test using a routine lab method. If values on the glucose meter are close to the lab results, you and your caregiver will see that your meter is working well  and you are using good technique. Your caregiver will advise you about what to do if the results do not match.  FREQUENCY OF TESTING  Your caregiver will tell you how often you should check your blood glucose. This will depend on your type of diabetes, your current level of diabetes control, and your types of medicines. The following are general guidelines, but your care plan may be different. Record all your readings and the time of day you took them for review with your caregiver.   Diabetes type 1.   When you  are using insulin with good diabetic control (either multiple daily injections or via a pump), you should check your glucose 4 times a day.   If your diabetes is not well controlled, you may need to monitor more frequently, including before meals and 2 hours after meals, at bedtime, and occasionally between 2 a.m. and 3 a.m.   You should always check your glucose before a dose of insulin or before changing the rate on your insulin pump.   Diabetes type 2.   Guidelines for SMBG in diabetes type 2 are not as well defined.   If you are on insulin, follow the guidelines above.   If you are on medicines, but not insulin, and your glucose is not well controlled, you should test at least twice daily.   If you are not on insulin, and your diabetes is controlled with medicines or diet alone, you should test at least once daily, usually before breakfast.   A weekly profile will help your caregiver advise you on your care plan. The week before your visit, check your glucose before a meal and 2 hours after a meal at least daily. You may want to test before and after a different meal each day so you and your caregiver can tell how well controlled your blood sugars are throughout the course of a 24 hour period.   Gestational diabetes (diabetes during pregnancy).   Frequent testing is often necessary. Accurate timing is important.   If you are not on insulin, check your glucose 4 times a day. Check it before breakfast and 1 hour after the start of each meal.   If you are on insulin, check your glucose 6 times a day. Check it before each meal and 1 hour after the first bite of each meal.   General guidelines.   More frequent testing is required at the start of insulin treatment. Your caregiver will instruct you.   Test your glucose any time you suspect you have low blood sugar (hypoglycemia).   You should test more often when you change medicines, when you have unusual stress or illness, or in other  unusual circumstances.  OTHER THINGS TO KNOW ABOUT GLUCOSE METERS  Measurement Range. Most glucose meters are able to read glucose levels over a broad range of values from as low as 0 to as high as 600 mg/dL. If you get an extremely high or low reading from your meter, you should first confirm it with another reading. Report very high or very low readings to your caregiver.   Whole Blood Glucose versus Plasma Glucose. Some older home glucose meters measure glucose in your whole blood. In a lab or when using some newer home glucose meters, the glucose is measured in your plasma (one component of blood). The difference can be important. It is important for you and your caregiver to know whether your meter gives its results as "whole blood equivalent" or "plasma  equivalent."   Display of High and Low Glucose Values. Part of learning how to operate a meter is understanding what the meter results mean. Know how high and low glucose concentrations are displayed on your meter.   Factors that Affect Glucose Meter Performance. The accuracy of your test results depends on many factors and varies depending on the brand and type of meter. These factors include:   Low red blood cell count (anemia).   Substances in your blood (such as uric acid, vitamin C, and others).   Environmental factors (temperature, humidity, altitude).   Name-brand versus generic test strips.   Calibration. Make sure your meter is set up properly. It is a good idea to do a calibration test with a control solution recommended by the manufacturer of your meter whenever you begin using a fresh bottle of test strips. This will help verify the accuracy of your meter.   Improperly stored, expired, or defective test strips. Keep your strips in a dry place with the lid on.   Soiled meter.   Inadequate blood sample.  NEW TECHNOLOGIES FOR GLUCOSE TESTING Alternative site testing Some glucose meters allow testing blood from alternative  sites. These include the:  Upper arm.   Forearm.   Base of the thumb.   Thigh.  Sampling blood from alternative sites may be desirable. However, it may have some limitations. Blood in the fingertips show changes in glucose levels more quickly than blood in other parts of the body. This means that alternative site test results may be different from fingertip test results, not because of the meter's ability to test accurately, but because the actual glucose concentration can be different.  Continuous Glucose Monitoring Devices to measure your blood glucose continuously are available, and others are in development. These methods can be more expensive than self-monitoring with a glucose meter. However, it is uncertain how effective and reliable these devices are. Your caregiver will advise you if this approach makes sense for you. IF BLOOD SUGARS ARE CONTROLLED, PEOPLE WITH DIABETES REMAIN HEALTHIER.  SMBG is an important part of the treatment plan of patients with diabetes mellitus. Below are reasons for using SMBG:   It confirms that your glucose is at a specific, healthy level.   It detects hypoglycemia and severe hyperglycemia.   It allows you and your caregiver to make adjustments in response to changes in lifestyle for individuals requiring medicine.   It determines the need for starting insulin therapy in temporary diabetes that happens during pregnancy (gestational diabetes).  Document Released: 07/05/2003 Document Revised: 03/15/2011 Document Reviewed: 10/26/2010 [facility] Patient Information 2012 [place], [place].Diabetes and Exercise Regular exercise is important and can help:   Control blood glucose (sugar).   Decrease blood pressure.    Control blood lipids (cholesterol, triglycerides).   Improve overall health.  BENEFITS FROM EXERCISE  Improved fitness.   Improved flexibility.   Improved endurance.   Increased bone density.   Weight control.   Increased muscle  strength.   Decreased body fat.   Improvement of the body's use of insulin, a hormone.   Increased insulin sensitivity.   Reduction of insulin needs.   Reduced stress and tension.   Helps you feel better.  People with diabetes who add exercise to their lifestyle gain additional benefits, including:  Weight loss.   Reduced appetite.   Improvement of the body's use of blood glucose.   Decreased risk factors for heart disease:   Lowering of cholesterol and triglycerides.   Raising the  level of good cholesterol (high-density lipoproteins, HDL).   Lowering blood sugar.   Decreased blood pressure.  TYPE 1 DIABETES AND EXERCISE  Exercise will usually lower your blood glucose.   If blood glucose is greater than 240 mg/dl, check urine ketones. If ketones are present, do not exercise.   Location of the insulin injection sites may need to be adjusted with exercise. Avoid injecting insulin into areas of the body that will be exercised. For example, avoid injecting insulin into:   The arms when playing tennis.   The legs when jogging. For more information, discuss this with your caregiver.   Keep a record of:   Food intake.   Type and amount of exercise.   Expected peak times of insulin action.   Blood glucose levels.  Do this before, during, and after exercise. Review your records with your caregiver. This will help you to develop guidelines for adjusting food intake and insulin amounts.  TYPE 2 DIABETES AND EXERCISE  Regular physical activity can help control blood glucose.   Exercise is important because it may:   Increase the body's sensitivity to insulin.   Improve blood glucose control.   Exercise reduces the risk of heart disease. It decreases serum cholesterol and triglycerides. It also lowers blood pressure.   Those who take insulin or oral hypoglycemic agents should watch for signs of hypoglycemia. These signs include dizziness, shaking, sweating, chills,  and confusion.   Body water is lost during exercise. It must be replaced. This will help to avoid loss of body fluids (dehydration) or heat stroke.  Be sure to talk to your caregiver before starting an exercise program to make sure it is safe for you. Remember, any activity is better than none.  Document Released: 09/22/2003 Document Revised: 03/14/2011 Document Reviewed: 01/06/2009 [facility] Patient Information 2012 [place], [place].Blood Sugar Monitoring, Adult GLUCOSE METERS FOR SELF-MONITORING OF BLOOD GLUCOSE  It is important to be able to correctly measure your blood sugar (glucose). You can use a blood glucose monitor (a small battery-operated device) to check your glucose level at any time. This allows you and your caregiver to monitor your diabetes and to determine how well your treatment plan is working. The process of monitoring your blood glucose with a glucose meter is called self-monitoring of blood glucose (SMBG). When people with diabetes control their blood sugar, they have better health. To test for glucose with a typical glucose meter, place the disposable strip in the meter. Then place a small sample of blood on the "test strip." The test strip is coated with chemicals that combine with glucose in blood. The meter measures how much glucose is present. The meter displays the glucose level as a number. Several new models can record and store a number of test results. Some models can connect to personal computers to store test results or print them out.  Newer meters are often easier to use than older models. Some meters allow you to get blood from places other than your fingertip. Some new models have automatic timing, error codes, signals, or barcode readers to help with proper adjustment (calibration). Some meters have a large display screen or spoken instructions for people with visual impairments.  INSTRUCTIONS FOR USING GLUCOSE METERS  Wash your hands with soap and warm water, or  clean the area with alcohol. Dry your hands completely.   Prick the side of your fingertip with a lancet (a sharp-pointed tool used by hand).   Hold the hand down and gently  milk the finger until a small drop of blood appears. Catch the blood with the test strip.   Follow the instructions for inserting the test strip and using the SMBG meter. Most meters require the meter to be turned on and the test strip to be inserted before applying the blood sample.   Record the test result.   Read the instructions carefully for both the meter and the test strips that go with it. Meter instructions are found in the user manual. Keep this manual to help you solve any problems that may arise. Many meters use "error codes" when there is a problem with the meter, the test strip, or the blood sample on the strip. You will need the manual to understand these error codes and fix the problem.   New devices are available such as laser lancets and meters that can test blood taken from "alternative sites" of the body, other than fingertips. However, you should use standard fingertip testing if your glucose changes rapidly. Also, use standard testing if:   You have eaten, exercised, or taken insulin in the past 2 hours.   You think your glucose is low.   You tend to not feel symptoms of low blood glucose (hypoglycemia).   You are ill or under stress.   Clean the meter as directed by the manufacturer.   Test the meter for accuracy as directed by the manufacturer.   Take your meter with you to your caregiver's office. This way, you can test your glucose in front of your caregiver to make sure you are using the meter correctly. Your caregiver can also take a sample of blood to test using a routine lab method. If values on the glucose meter are close to the lab results, you and your caregiver will see that your meter is working well and you are using good technique. Your caregiver will advise you about what to do if  the results do not match.  FREQUENCY OF TESTING  Your caregiver will tell you how often you should check your blood glucose. This will depend on your type of diabetes, your current level of diabetes control, and your types of medicines. The following are general guidelines, but your care plan may be different. Record all your readings and the time of day you took them for review with your caregiver.   Diabetes type 1.   When you are using insulin with good diabetic control (either multiple daily injections or via a pump), you should check your glucose 4 times a day.   If your diabetes is not well controlled, you may need to monitor more frequently, including before meals and 2 hours after meals, at bedtime, and occasionally between 2 a.m. and 3 a.m.   You should always check your glucose before a dose of insulin or before changing the rate on your insulin pump.   Diabetes type 2.   Guidelines for SMBG in diabetes type 2 are not as well defined.   If you are on insulin, follow the guidelines above.   If you are on medicines, but not insulin, and your glucose is not well controlled, you should test at least twice daily.   If you are not on insulin, and your diabetes is controlled with medicines or diet alone, you should test at least once daily, usually before breakfast.   A weekly profile will help your caregiver advise you on your care plan. The week before your visit, check your glucose before a meal and  2 hours after a meal at least daily. You may want to test before and after a different meal each day so you and your caregiver can tell how well controlled your blood sugars are throughout the course of a 24 hour period.   Gestational diabetes (diabetes during pregnancy).   Frequent testing is often necessary. Accurate timing is important.   If you are not on insulin, check your glucose 4 times a day. Check it before breakfast and 1 hour after the start of each meal.   If you are on  insulin, check your glucose 6 times a day. Check it before each meal and 1 hour after the first bite of each meal.   General guidelines.   More frequent testing is required at the start of insulin treatment. Your caregiver will instruct you.   Test your glucose any time you suspect you have low blood sugar (hypoglycemia).   You should test more often when you change medicines, when you have unusual stress or illness, or in other unusual circumstances.  OTHER THINGS TO KNOW ABOUT GLUCOSE METERS  Measurement Range. Most glucose meters are able to read glucose levels over a broad range of values from as low as 0 to as high as 600 mg/dL. If you get an extremely high or low reading from your meter, you should first confirm it with another reading. Report very high or very low readings to your caregiver.   Whole Blood Glucose versus Plasma Glucose. Some older home glucose meters measure glucose in your whole blood. In a lab or when using some newer home glucose meters, the glucose is measured in your plasma (one component of blood). The difference can be important. It is important for you and your caregiver to know whether your meter gives its results as "whole blood equivalent" or "plasma equivalent."   Display of High and Low Glucose Values. Part of learning how to operate a meter is understanding what the meter results mean. Know how high and low glucose concentrations are displayed on your meter.   Factors that Affect Glucose Meter Performance. The accuracy of your test results depends on many factors and varies depending on the brand and type of meter. These factors include:   Low red blood cell count (anemia).   Substances in your blood (such as uric acid, vitamin C, and others).   Environmental factors (temperature, humidity, altitude).   Name-brand versus generic test strips.   Calibration. Make sure your meter is set up properly. It is a good idea to do a calibration test with a control  solution recommended by the manufacturer of your meter whenever you begin using a fresh bottle of test strips. This will help verify the accuracy of your meter.   Improperly stored, expired, or defective test strips. Keep your strips in a dry place with the lid on.   Soiled meter.   Inadequate blood sample.  NEW TECHNOLOGIES FOR GLUCOSE TESTING Alternative site testing Some glucose meters allow testing blood from alternative sites. These include the:  Upper arm.   Forearm.   Base of the thumb.   Thigh.  Sampling blood from alternative sites may be desirable. However, it may have some limitations. Blood in the fingertips show changes in glucose levels more quickly than blood in other parts of the body. This means that alternative site test results may be different from fingertip test results, not because of the meter's ability to test accurately, but because the actual glucose concentration can be  different.  Continuous Glucose Monitoring Devices to measure your blood glucose continuously are available, and others are in development. These methods can be more expensive than self-monitoring with a glucose meter. However, it is uncertain how effective and reliable these devices are. Your caregiver will advise you if this approach makes sense for you. IF BLOOD SUGARS ARE CONTROLLED, PEOPLE WITH DIABETES REMAIN HEALTHIER.  SMBG is an important part of the treatment plan of patients with diabetes mellitus. Below are reasons for using SMBG:   It confirms that your glucose is at a specific, healthy level.   It detects hypoglycemia and severe hyperglycemia.   It allows you and your caregiver to make adjustments in response to changes in lifestyle for individuals requiring medicine.   It determines the need for starting insulin therapy in temporary diabetes that happens during pregnancy (gestational diabetes).  Document Released: 07/05/2003 Document Revised: 03/15/2011 Document Reviewed:  10/26/2010 [employer] Patient Information 2012 [place], [place].???????? ?????? ?????? ? ?????, ? ???????? (Blood Sugar Monitoring, Adult)  ?????????? (?????????? ???????) ??? ???????????????? ???????? ?????? ??????? ? ????? ???????? ? ????????? ?????? ?????????? ????????? ? ????? ??????????? ??????? ??????? ? ?????. ?? ?????? ???????????? ????????? ?????? (???????? ?? ??????????) ??? ????????? ?????? ??????? ? ????? ????? ?????. ??? ???????? ??? ? ?????? ???????? ????? ?????????????? ?????? ? ?????????? ????????????? ??????????? ???????. ????????? ???????????????? ????????? ?????? ??????? ? ????? ?????????? ??????????????? ????????? ??????? ? ????? ("SMBG"). ???? ??????? ?????????????? ???????????? ????? ? ????? (???????), ??/??? ?????? ????? ??????????? ???? ?????. ??? ?????????? ????? ?? ??????? ? ?????????????? ???????? ?????????? ????????? ??????????? ??????? ? ?????????. ????? ????????? ????? ????? ?? "???????? ???????". ????????? ??????? ? ???????? ??????????. ???????? ??????? ??????? ?????????? ????????, ??????? ????????? ? ???????? ? ?????. ????????? ???????? ?????????? ??????? ? ?????. ?????? ???????????? ?? ?????? ???????. ????????? ????? ?????? ????? ?????????? ? ??????? ?????????? ????????????. ???? ??????????, ??????? ????? ???????????? ? ?????????? ??? ???????? ??? ?????????? ??????.  ????? ?????? ??????????? ????? ?????? ? ?????????????, ??? ?????? ??????. ?????? ?????????? ???? ??????????? ?????????? ????? ????? ?? ?????? ?? ??????. ? ????????? ????? ??????? ???? ?????????????? ???????, ???? ??????, ??????? ???????? ??? ?????, ??? ????????? ??? ????????? ?????? (???????? ??????????). ???? ???????? ? ??????? ??????? ??? ? ?????? ????????? ?????? ? ???????, ??? ????? ??????????? ??? ????????? ? ?????? ???????.  ?????????? ?? ?????????? ???????????  ??????? ???? ?????? ????? ? ?????, ??? ???????? ????? ?????? ????? ???????. ????????? ???????? ????.   ??????? ????????? ?????? ?? ???? ????????  (??????????? ?????? ??????????? ??????????).   ????????? ????? ?????????? ???? ? ?????? ???????, ????? ????????? ????????? ????? ?????. ???????? ???????? ???????? ????? ?????.   ???????? ??????????? ?? ???????????? ?????????????? ???? ??????????, ????? ?????? ????????? ???????? ??????? ? ?????????. ??????????? ??????????? ?????????? ???????? ? ????????? ? ??? ???????? ??????? ?? ????, ??? ??????? ?? ??? ????? ?????.   ???????? ?????????? ??????.   ??????????? ??????? ?????????? ?? ???????????? ???????? ? ???????? ???????. ?????????? ????????? ? ???????? ?????? ? ????????. ?? ???????????? ?????????? ?? ??? ??????, ???? ?? ???-?????? ????????. ? ?????? ????????? ???? ??????? ???????? ????? ??????, ??????? ????????? ??? ???????????? ???????????? ??? ??? ????????? ? ???????? ????????? ??? ?????????? ??????? ????? ?? ???????? ???????. ?????????? ?? ???????????? ?????????? ??? ??? ????, ????? ?????, ????? ?????? ?????????? ?? ??????? ? ??? ????? ????????? ????????? ??????.   ?????? ????? ?????????? ????? ??????????????, ????????, ???????? ??????? ? ????????, ??????? ????????? ????? ????? ????? ?? ?????? ?? ??????. ?????? ? ??? ???????, ????? ??????? ??????? ??????????, ??????? ????? ????? ?? ???????? ??????? ?? ??????????? ?????????. ????? ???????? ??????????? ?????????, ????:   ?? ?????, ????????? ?????????? ?????????? ??? ??????? ??????? ? ??????????  2 ????.   ?? ?????????, ??? ? ??? ????????? ??????? ???????.   ??, ??? ???????, ?? ???????? ????????? ??????????? ?????? ??????? ? ????? (????????????).   ?? ???????? ??? ??????????? ??????.   ?????????? ??????, ?????? ??????????? ????????????.   ?????????? ???????? ?????? ??????????, ?????? ??????????? ?????????????.   ???????? ????????? ? ????? ?? ????? ? ?????. ?? ??????? ????????? ??????? ??????? ? ??????????? ?????, ??????? ????? ???????????, ??? ?? ?? ????????? ???????. ??? ??????? ???? ????? ????? ????? ??????? ?? ??????? ???????????? ??????.  ???? ?????????? ?????? ????????? ?????????? ?????? ? ??????, ?????????? ? ???????????, ?? ? ??? ??????? ???? ?????? ???? ???????, ??? ????????? ????????? ???????? ? ??? ?? ??? ????????? ???????. ??? ??????? ???? ?????? ???, ??? ???? ??????, ???? ?????? ???????? ? ????????????? ??????? ?? ????????.  ????????????? ????????????  ??? ??????? ???? ??????? ???, ??? ????? ??? ?????????? ????????????? ??????? ??????? ? ?????. ??? ??????? ?? ???? ???????, ?????? ????????? ?????? ???????? ??????? ? ??????????? ????????. ???? ????????? ????? ?????????, ?? ??? ?????????????? ???? ??????? ????? ?? ??? ??????????. ??????????? ??? ?????? ? ????? ?????????? ???????, ????? ????? ???? ??? ??????????? ?????.   ?????? 1 ????.   ???? ?? ??????? ??????? ??? ??????????? ??????? (???????? ??? ????? ?????), ??? ?????????? ????????? ??????? ??????? 4 ???? ? ????.   ???? ?????? ?? ????? ?????? ??????????????, ??? ??????? ?????????????? ??????? ????, ??????? ?????????? ????? ??????? ???? ? ????? ??? ???? ????? ?????? ????, ????? ????, ? ?????? ????? 2 ? 3 ?????? ????.   ??? ?????? ?????????? ????????? ??????? ??????? ? ????? ????? ?????? ????????? ???? ???????? ??? ????? ?????????? ??????? ?????? ??????????? ?????.   ?????? 2 ????.   ???????????? ??? [facility] ??? ??????? 2 ???? ????? ?? ??????????.   ???? ?? ?????????? ???????, ???????? ???????????, ??????????? ????.   ???? ?? ?????????? ?????????, ?? ?? ???????, ? ??????? ??????? ?? ?????????, ??? ??????? ?????? ?????? ?? ???? 2-? ??? ? ????.   ???? ?? ?? ?????????? ???????, ?????? ?????????????? ????? ??????????? ??? ?????? ??????, ??? ??????? ?????? ?????? ?? ???? 1 ???? ? ????, ?????? ????? ?????????.   ?????? ?????? ?? ?????? ??????? ?????? ???????? ????? ????????? ??? ????? ?????? ??????. ?? ?????? ?? ???????????? ?????? ????????? ?????????? ??????? ??????? ????? ??????? ???? ? ????? 2 ???? ????? ?????? ????. ????????????? ?????? ?????? ?? ? ????? ??????? ???? ? ??????  ????? ????? ?????? ????, ????? ?? ? ??? ??????? ???? ?????, ????????? ?????????? ??????? ?????????????? ??????? ?????? ? ????? ? ??????? 24 ???????? ???????.   ?????? ?????????? (?????? ?? ????? ????????????).   ????? ????????? ?????????? ????????????. ????? ????????? ???????????? ? ???? ? ?? ?? ?????.   ???? ?? ?? ?????????? ???????, ?????????? ??????? ??????? 4 ???? ? ????. ?????? ?????????? ?????? ?? ???????? ? ????? 1 ??? ????? ?????? ??????? ?????? ????.   ???? ?? ?????????? ???????, ?????????? ??????? ??????? 6 ??? ? ????. ?????? ?????????? ?????? ?? ?????? ???? ? ????? 1 ??? ????? ?????? ??????? ?????? ????.   ????? ?????????.   ????? ?????? ????? ????????? ?? ????????? ????? ??????? ?????????. ??? ??????? ???? ??? ????????????????.   ?????????? ??????? ??????? ?????? ???, ????? ?? ????????????, ??? ? ??? ?? ?????? (????????????).   ??? ???? ????????? ??????? ?????? ???? ??? ????? ??????????? ????????, ????? ?? ??????????? ????????? ?????? ??? ???????, ??? ? ????? ?????? ????????? ???????????????.  ?????????????? ?????????? ? ???????????  ????????????? ????????. ??????????? ??????????? ????? ???????? ??????? ??????? ? ????? ? ??????? ?????????: ?? 0 ?? 600 mg/dL. ???? ????????? ?????? ????? ??????? ??? ?????? ?????????, ??? ??????? ?????????? ????????? ???????????? ???????, ??????? ??? ???? ?????????. ???????? ????? ??? ?????????? ? ????? ??????? ??? ????? ?????? ??????????? ??????? ? ?????.   ??????? ? ????? ? ??????? ? ?????? ?????. ????????? ?????? ?????? ??????? ??????????? ???????? ??????? ?????? ? ????? ? ?????. ? ??????????? ? ? ????? ????? ??????? ??????? ??????????? ?????????? ??????? ??????? ? ?????? (???? ?? ??????????? ?????). ??????? ????? ???? ????????????. ??? ? ?????? ???????? ????? ?????????? ?????, ? ????? ????????? ??????? ???????? ??????, ??????? ?? ??????????? - ??????? ??????? ? ????? ??? ??????? ??????? ? ?????? ?????.   ????????????? ???????? ? ??????? ??????  ???????. ????? ????????? ??????????????? ???????, ?????????? ????????? ???????????????? ?????????? ??????. ??? ???? ?????, ????? ??????? ?? ??????? ??????? ???????????? ??????? ? ?????? ???????????? ??????? ? ?????, ?????????? ????? ????????.   ???????, ???????? ?? ?????? ??????????. ???????? ?????????? ??????????? ??????? ?? ?????? ????????, ????? ? ???? ??????????. ??????? ????????:   ?????? ??????? ??????? ???????? ?????? (??????).   ??????????? ????????? ??????? ? ?????????? ?????????? ? ????? (????????, ??????? ???????, ??????? ? ? ????????? ??????).   ?????????? ????? (???????????, ?????????, ?????? ??? ??????? ????).   ????????????? ?????? ??????? ?????????? ? ???????? ?????.   ????????? (??????????). ?? ?????? ????????? ????????? ?????????. ????????????? ????????? ????????????? ?????????? ??????????? ??????????? ?????????, ???????????? ?????????????? ??????????, ?????? ???, ????? ?? ?????????? ????? ???????? ???????? ???????. ??? ????? ??????????? ???????? ?????? ??????????.   ??????????? ???????????, ? ???????? ?????? ???????? ??? ??????????? ???????? ???????. ??????? ?????????? ??????? ? ????? ?????, ?????? ???????? ???????.   ???????????? ?????????.   ???????????? ????? ??? ???????.  ????? ??????????? ? ??????????? ?????? ??????? ?????? ????? ?????? ????? ????????? ????? ??????????? ????????? ????????? ????? ????? ? ?????? ?????? ????. ????????:  ?????.   ??????????.   ????? ??????? ???????? ?????? ????.   ?????.  ????? ????? ?? ?????? ?????? ???? ????? ???? ???????. ?????? ?? ????? ????? ????? ??? ???????????. ????? ?? ?????????? ???????? ??????? ??????? ?????????? ????????? ?????? ???????, ??? ?????, ?????? ?? ?????? ?????? ????. ??? ????????, ??? ??? ??????? ?????, ?????? ?? ?????? ?????? ????, ?????????? ?????????? ????? ?????????? ?? ??????????? ??????? ????? ?? ??????, ?? ?? ??????, ??? ????????? ???????? ?? ?????????, ? ?????? ??? ???????? ???????????? ??????? ?????  ??????????.  ?????????? ?????????? ?????? ??????? ? ????? ?????????? ???????, ??????? ????? ????????? ???????? ??????? ??????? ? ?????. ????? ????????????? ????? ??????? ??? ???? ?? ??????. ???? ????? ??????? ????? ???? ????? ??????? ? ????????? ? ??????? ???????????. ?? ?????? ?????? ?????????? ? ????????????? ???? ???????? ?? ????????????. ??? ??????? ???? ??????? ???, ??????? ?? ????????? ?????? ????? ???????. ???? ????? ? ????? ??????????????, ???????? ? ???????? ?????? ???????? ????? ?????????.  SM[facility] ???????? ?????? ?????? ??????? ????????? ???????. ???? ?????????? ???????, ?? ??????? ???????? ??????? ?????????????? ?????????????? ??????? ??????? ? ?????:   ?????????? ???????? ???????? ??? ?????????, ??? ? ??? ??????? ??????? ????????? ? ???????? ?????.   ?? ???????????? ???????????? ? ?????? ?????????????.   ?? ????????? ??? ? ?????? ???????? ????? ??????? ????????????? ? ?????? ?? ????????? ? ?????? ????? ?????????, ??????????? ? ?????? ????????.   ????????? ???????????????? ??????????? ?????? ??????? ? ?????, ????? ?????????? ??????, ????? ?????????? ??????????? ??????? ??? ????????? ??????? ?????????? (???????????? ???????? ??????).  Document Released: 07/02/2005 Document Revised: 03/15/2011 [facility] Patient Information 2012 [place], [place].Diabetes and Exercise Regular exercise is important and can help:   Control blood glucose (sugar).   Decrease blood pressure.    Control blood lipids (cholesterol, triglycerides).   Improve overall health.  BENEFITS FROM EXERCISE  Improved fitness.   Improved flexibility.   Improved endurance.   Increased bone density.   Weight control.   Increased muscle strength.   Decreased body fat.   Improvement of the body's use of insulin, a hormone.   Increased insulin sensitivity.   Reduction of insulin needs.   Reduced stress and tension.   Helps you feel better.  People with diabetes who add exercise to their lifestyle gain  additional benefits, including:  Weight loss.   Reduced appetite.   Improvement of the body's use of blood glucose.   Decreased risk factors for heart disease:   Lowering of cholesterol and triglycerides.   Raising the level of good cholesterol (high-density lipoproteins, HDL).   Lowering blood sugar.   Decreased blood pressure.  TYPE 1 DIABETES AND EXERCISE  Exercise will usually lower your blood glucose.   If blood glucose is greater than 240 mg/dl, check urine ketones. If ketones are present, do not exercise.   Location of the insulin injection sites may need to be adjusted with exercise. Avoid injecting insulin into areas of the body that will be exercised. For example, avoid injecting insulin into:   The arms when playing tennis.   The legs when jogging. For more information, discuss this with your caregiver.   Keep a record of:   Food intake.   Type and amount of exercise.   Expected peak times of insulin action.   Blood glucose levels.  Do this before, during, and after exercise. Review your records with your caregiver. This will help you to develop guidelines for adjusting food intake and insulin amounts.  TYPE 2 DIABETES AND EXERCISE  Regular physical activity can help control blood glucose.   Exercise is important because it may:   Increase the body's sensitivity to insulin.   Improve blood glucose control.   Exercise reduces the risk of heart disease. It decreases serum cholesterol and triglycerides. It also lowers blood pressure.   Those who take insulin or oral hypoglycemic agents should watch for signs of hypoglycemia. These signs include dizziness, shaking, sweating, chills, and confusion.   Body water is lost during exercise. It must be replaced. This will help to avoid loss of body fluids (dehydration) or heat stroke.  Be sure to talk to your caregiver before starting an exercise program to make sure it is safe for you. Remember, any activity is  better than none.  Document Released: 09/22/2003 Document Revised: 03/14/2011 Document Reviewed: 01/06/2009 [facility] Patient Information 2012 [place], [place].

## 2011-07-24 NOTE — Discharge Summary (Addendum)
Physician Discharge Summary  Melissa Schneider MRN: [id number] DOB/AGE: 55-Jun-1957 55 y.o.  PCP: Londell Moh, MD, MD   Admit date: 07/20/2011 Discharge date: 07/24/2011  Discharge Diagnoses:  1. Uncontrolled type 2 diabetes mellitus with DKA. 2. The patient's hemoglobin A1c was 15.9. 3. Nausea and vomiting, secondary to DKA. Diabetic gastroparesis was a consideration. Also chronic constipation. 4. Hypokalemia. Her serum potassium was 3.2 at the time of discharge. She was discharged on supplementation for 2 weeks. Followup serum potassium is recommended. 5. Hyponatremia. Resolved. 6. Hypothyroidism. 7. Microcytic anemia. Further outpatient evaluation is recommended. 8. History of hyperlipidemia. 9. History of migraine headaches. 10. Leukocytosis, likely reactive secondary to DKA.    Current Discharge Medication List    START taking these medications   Details  !! insulin aspart (NOVOLOG) 100 UNIT/ML injection Inject 8 Units into the skin 3 (three) times daily with meals. Qty: 1 vial, Refills: 6    !! insulin aspart (NOVOLOG) 100 UNIT/ML injection Inject 0-17 Units into the skin 3 (three) times daily before meals. SLIDING SCALE: FOR BLOOD SUGAR OF 121-150 TAKE 2 UNITS; FOR  BLOOD SUGAR OF 151-200 TAKE 4 UNITS; FOR BLOOD SUGAR OF 201-250 TAKE 7 UNITS; FOR BLOOD SUGAR OF 251-300 TAKE 9 UNITS; FOR BLOOD SUGAR OF 301-350 TAKE 12 UNITS; FOR BLOOD SUGAR OF 351-400 UNITS, TAKE 15 UNITS; FOR BLOOD SUGAR OF GREATER THAN 400 TAKE 17 UNITS AND CALL YOUR DOCTOR. Qty: 1 vial, Refills: 6    polyethylene glycol (MIRALAX / GLYCOLAX) packet Take 17 g by mouth daily. Qty: 14 each    potassium chloride SA (K-DUR,KLOR-CON) 20 MEQ tablet Take 1 tablet (20 mEq total) by mouth 2 (two) times daily. Qty: 30 tablet, Refills: 0     !! - Potential duplicate medications found. Please discuss with provider.    CONTINUE these medications which have NOT CHANGED   Details  insulin glargine  (LANTUS) 100 UNIT/ML injection Inject 20 Units into the skin at bedtime.      levothyroxine (SYNTHROID, LEVOTHROID) 50 MCG tablet Take 50 mcg by mouth daily.      losartan (COZAAR) 25 MG tablet Take 25 mg by mouth daily.      pravastatin (PRAVACHOL) 40 MG tablet Take 40 mg by mouth daily.      SUMAtriptan (IMITREX) 100 MG tablet Take 100 mg by mouth every 2 (two) hours as needed. For migraines    SUMAtriptan (IMITREX) 6 MG/0.5ML SOLN injection Inject 6 mg into the skin every 2 (two) hours as needed. For migraines       STOP taking these medications     glipiZIDE (GLUCOTROL) 5 MG tablet      metformin (FORTAMET) 1000 MG (OSM) 24 hr tablet      sitaGLIPtin (JANUVIA) 100 MG tablet         Discharge Condition: Improved and stable.  Disposition: Home or Self Care   Consults: Dr. Fransico Him.   Significant Diagnostic Studies: Dg Chest Portable 1 View  07/20/2011  *RADIOLOGY REPORT*  Clinical Data: Vomiting.  DKA.  PORTABLE CHEST - 1 VIEW  Comparison: 11/04/2010  Findings: The heart size and mediastinal contours are within normal limits.  Both lungs are clear.  The visualized skeletal structures are unremarkable.  IMPRESSION: No active cardiopulmonary abnormalities.  Original Report Authenticated By: Rosealee Albee, M.D.   Dg Abd Acute W/chest  07/22/2011  *RADIOLOGY REPORT*  Clinical Data: Recurrent DKA, evaluate for infection  ACUTE ABDOMEN SERIES (ABDOMEN 2 VIEW & CHEST 1 VIEW)  Comparison: Chest radiograph dated 07/20/2011  Findings: Lungs are clear. No pleural effusion or pneumothorax.  Cardiomediastinal silhouette is within normal limits.  Nonobstructive bowel gas pattern.  Moderate stool throughout the colon.  No evidence of free air under the diaphragm on the upright view.  Visualized osseous structures are within normal limits.  IMPRESSION: No evidence of acute cardiopulmonary disease.  No evidence of small bowel obstruction or free air.  Moderate stool throughout the colon.  Original  Report Authenticated By: Charline Bills, M.D.     Microbiology: Recent Results (from the past 240 hour(s))  MRSA PCR SCREENING     Status: Normal   Collection Time   07/20/11  4:45 PM      Component Value Range Status Comment   MRSA by PCR NEGATIVE  NEGATIVE  Final   CULTURE, BLOOD (ROUTINE X 2)     Status: Normal (Preliminary result)   Collection Time   07/22/11  9:14 AM      Component Value Range Status Comment   Specimen Description BLOOD RIGHT HAND   Final    Special Requests     Final    Value: BOTTLES DRAWN AEROBIC AND ANAEROBIC 8CC AEROBIC,6CC ANAEROBIC   Culture NO GROWTH 2 DAYS   Final    Report Status PENDING   Incomplete   CULTURE, BLOOD (ROUTINE X 2)     Status: Normal (Preliminary result)   Collection Time   07/22/11 10:03 AM      Component Value Range Status Comment   Specimen Description BLOOD RIGHT HAND   Final    Special Requests     Final    Value: BOTTLES DRAWN AEROBIC AND ANAEROBIC 8CC EACH BOTTLE   Culture NO GROWTH 2 DAYS   Final    Report Status PENDING   Incomplete      Labs: Results for orders placed during the hospital encounter of 07/20/11 (from the past 48 hour(s))  GLUCOSE, CAPILLARY     Status: Abnormal   Collection Time   07/22/11 11:17 AM      Component Value Range Comment   Glucose-Capillary 287 (*) 70 - 99 (mg/dL)    Comment 1 Documented in Chart      Comment 2 Notify RN      Comment 3 Glucose Stabilizer     GLUCOSE, CAPILLARY     Status: Abnormal   Collection Time   07/22/11 12:16 PM      Component Value Range Comment   Glucose-Capillary 220 (*) 70 - 99 (mg/dL)    Comment 1 Notify RN      Comment 2 Documented in Chart     GLUCOSE, CAPILLARY     Status: Abnormal   Collection Time   07/22/11  1:19 PM      Component Value Range Comment   Glucose-Capillary 219 (*) 70 - 99 (mg/dL)    Comment 1 Notify RN      Comment 2 Documented in Chart     GLUCOSE, CAPILLARY     Status: Abnormal   Collection Time   07/22/11  2:11 PM      Component Value  Range Comment   Glucose-Capillary 207 (*) 70 - 99 (mg/dL)   GLUCOSE, CAPILLARY     Status: Abnormal   Collection Time   07/22/11  3:15 PM      Component Value Range Comment   Glucose-Capillary 164 (*) 70 - 99 (mg/dL)    Comment 1 Notify RN      Comment 2 Documented  in Chart     BASIC METABOLIC PANEL     Status: Abnormal   Collection Time   07/22/11  3:53 PM      Component Value Range Comment   Sodium 131 (*) 135 - 145 (mEq/L)    Potassium 2.9 (*) 3.5 - 5.1 (mEq/L) DELTA CHECK NOTED   Chloride 105  96 - 112 (mEq/L)    CO2 20  19 - 32 (mEq/L)    Glucose, Bld 143 (*) 70 - 99 (mg/dL)    BUN 10  6 - 23 (mg/dL)    Creatinine, Ser 9.14 (*) 0.50 - 1.10 (mg/dL)    Calcium 9.2  8.4 - 10.5 (mg/dL)    GFR calc non Af Amer >90  >90 (mL/min)    GFR calc Af Amer >90  >90 (mL/min)   GLUCOSE, CAPILLARY     Status: Abnormal   Collection Time   07/22/11  4:21 PM      Component Value Range Comment   Glucose-Capillary 125 (*) 70 - 99 (mg/dL)    Comment 1 Notify RN      Comment 2 Documented in Chart     GLUCOSE, CAPILLARY     Status: Abnormal   Collection Time   07/22/11  5:16 PM      Component Value Range Comment   Glucose-Capillary 117 (*) 70 - 99 (mg/dL)    Comment 1 Notify RN      Comment 2 Documented in Chart     GLUCOSE, CAPILLARY     Status: Abnormal   Collection Time   07/22/11  6:20 PM      Component Value Range Comment   Glucose-Capillary 110 (*) 70 - 99 (mg/dL)    Comment 1 Notify RN      Comment 2 Documented in Chart     GLUCOSE, CAPILLARY     Status: Abnormal   Collection Time   07/22/11  7:24 PM      Component Value Range Comment   Glucose-Capillary 136 (*) 70 - 99 (mg/dL)    Comment 1 Documented in Chart      Comment 2 Notify RN     GLUCOSE, CAPILLARY     Status: Abnormal   Collection Time   07/22/11  8:33 PM      Component Value Range Comment   Glucose-Capillary 141 (*) 70 - 99 (mg/dL)    Comment 1 Documented in Chart      Comment 2 Notify RN     GLUCOSE, CAPILLARY     Status:  Abnormal   Collection Time   07/22/11  9:57 PM      Component Value Range Comment   Glucose-Capillary 167 (*) 70 - 99 (mg/dL)    Comment 1 Documented in Chart      Comment 2 Notify RN     GLUCOSE, CAPILLARY     Status: Abnormal   Collection Time   07/23/11  2:19 AM      Component Value Range Comment   Glucose-Capillary 260 (*) 70 - 99 (mg/dL)    Comment 1 Documented in Chart      Comment 2 Notify RN     GLUCOSE, CAPILLARY     Status: Abnormal   Collection Time   07/23/11  6:06 AM      Component Value Range Comment   Glucose-Capillary 229 (*) 70 - 99 (mg/dL)    Comment 1 Notify RN     GLUCOSE, CAPILLARY     Status: Abnormal   Collection  Time   07/23/11  7:50 AM      Component Value Range Comment   Glucose-Capillary 223 (*) 70 - 99 (mg/dL)    Comment 1 Notify RN      Comment 2 Documented in Chart     BASIC METABOLIC PANEL     Status: Abnormal   Collection Time   07/23/11  8:38 AM      Component Value Range Comment   Sodium 141  135 - 145 (mEq/L) DELTA CHECK NOTED   Potassium 3.0 (*) 3.5 - 5.1 (mEq/L)    Chloride 107  96 - 112 (mEq/L)    CO2 23  19 - 32 (mEq/L)    Glucose, Bld 250 (*) 70 - 99 (mg/dL)    BUN 5 (*) 6 - 23 (mg/dL)    Creatinine, Ser 1.61  0.50 - 1.10 (mg/dL)    Calcium 8.7  8.4 - 10.5 (mg/dL)    GFR calc non Af Amer >90  >90 (mL/min)    GFR calc Af Amer >90  >90 (mL/min)   CBC     Status: Abnormal   Collection Time   07/23/11  8:38 AM      Component Value Range Comment   WBC 6.2  4.0 - 10.5 (K/uL)    RBC 5.30 (*) 3.87 - 5.11 (MIL/uL)    Hemoglobin 11.3 (*) 12.0 - 15.0 (g/dL)    HCT 09.6 (*) 04.5 - 46.0 (%)    MCV 63.4 (*) 78.0 - 100.0 (fL)    MCH 21.3 (*) 26.0 - 34.0 (pg)    MCHC 33.6  30.0 - 36.0 (g/dL)    RDW 40.9 (*) 81.1 - 15.5 (%)    Platelets 203  150 - 400 (K/uL)   GLUCOSE, CAPILLARY     Status: Abnormal   Collection Time   07/23/11 11:47 AM      Component Value Range Comment   Glucose-Capillary 371 (*) 70 - 99 (mg/dL)    Comment 1 Notify RN       Comment 2 Documented in Chart     GLUCOSE, CAPILLARY     Status: Abnormal   Collection Time   07/23/11  4:47 PM      Component Value Range Comment   Glucose-Capillary 115 (*) 70 - 99 (mg/dL)    Comment 1 Notify RN      Comment 2 Documented in Chart     GLUCOSE, CAPILLARY     Status: Abnormal   Collection Time   07/23/11  9:26 PM      Component Value Range Comment   Glucose-Capillary 251 (*) 70 - 99 (mg/dL)    Comment 1 Notify RN     CBC     Status: Abnormal   Collection Time   07/24/11  4:42 AM      Component Value Range Comment   WBC 5.0  4.0 - 10.5 (K/uL)    RBC 5.42 (*) 3.87 - 5.11 (MIL/uL)    Hemoglobin 11.4 (*) 12.0 - 15.0 (g/dL)    HCT 91.4 (*) 78.2 - 46.0 (%)    MCV 62.2 (*) 78.0 - 100.0 (fL)    MCH 21.0 (*) 26.0 - 34.0 (pg)    MCHC 33.8  30.0 - 36.0 (g/dL)    RDW 95.6 (*) 21.3 - 15.5 (%)    Platelets 226  150 - 400 (K/uL)   HEPATIC FUNCTION PANEL     Status: Abnormal   Collection Time   07/24/11  4:42 AM  Component Value Range Comment   Total Protein 6.0  6.0 - 8.3 (g/dL)    Albumin 2.9 (*) 3.5 - 5.2 (g/dL)    AST 13  0 - 37 (U/L)    ALT 14  0 - 35 (U/L)    Alkaline Phosphatase 99  39 - 117 (U/L)    Total Bilirubin 0.4  0.3 - 1.2 (mg/dL)    Bilirubin, Direct 0.1  0.0 - 0.3 (mg/dL)    Indirect Bilirubin 0.3  0.3 - 0.9 (mg/dL)   LIPASE, BLOOD     Status: Normal   Collection Time   07/24/11  4:42 AM      Component Value Range Comment   Lipase 56  11 - 59 (U/L)   BASIC METABOLIC PANEL     Status: Abnormal   Collection Time   07/24/11  4:42 AM      Component Value Range Comment   Sodium 141  135 - 145 (mEq/L)    Potassium 3.2 (*) 3.5 - 5.1 (mEq/L)    Chloride 105  96 - 112 (mEq/L)    CO2 29  19 - 32 (mEq/L)    Glucose, Bld 116 (*) 70 - 99 (mg/dL)    BUN 5 (*) 6 - 23 (mg/dL)    Creatinine, Ser 4.09 (*) 0.50 - 1.10 (mg/dL)    Calcium 9.2  8.4 - 10.5 (mg/dL)    GFR calc non Af Amer >90  >90 (mL/min)    GFR calc Af Amer >90  >90 (mL/min)   GLUCOSE, CAPILLARY      Status: Abnormal   Collection Time   07/24/11  8:07 AM      Component Value Range Comment   Glucose-Capillary 113 (*) 70 - 99 (mg/dL)    Comment 1 Notify RN      Comment 2 Documented in Chart        HPI : The patient is a 56 year old woman with a past medical history significant for type 2 diabetes mellitus, who presented to the emergency department for uncontrolled nausea and vomiting. In the emergency department, her blood glucose was 537 and her CO2 was less than 5. Her serum sodium was 130 and her serum potassium was 4.1. Her liver transaminases were within normal limits with exception of a mildly elevated alkaline phosphatase of 140. Her lipase was marginally elevated at 67. Her white blood cell count was 20.2. Her hemoglobin was 11.8. Her MCV was 68.8. Her CK and troponin I were within normal limits. Her chest x-ray revealed no acute cardiopulmonary abnormalities. She was afebrile and mildly tachycardic. She was admitted for further evaluation and management.  HOSPITAL COURSE: The patient was started on treatment with IV fluids for hydration and the insulin drip glucometer protocol for treatment of DKA. She was treated symptomatically with antiemetics intravenously. Her leukocytosis was thought to be secondary to stress margination from DKA, however, a urinalysis was ordered for evaluation. Her urinalysis revealed no evidence of white blood cells or bacteria. It was positive for greater than 1000 glucose and greater than 80 ketones. Also, the influenza panel was ordered and it was negative.   The patient's DKA resolved. Her hemoglobin A1c was noted to be quite elevated at almost 16. She was transitioned to sliding scale NovoLog and Lantus. Glipizide was restarted, however, Januvia and metformin were not. This was pending the consultation with endocrinologist Dr. Fransico Him. In the meantime, during the resolution of her DKA, her serum potassium fell to a nadir of 2.9. She was given  potassium chloride IV  via runs. Once she was able to tolerate solid foods, she was started on repletion with potassium chloride orally.  The patient complained of nausea and vomiting even after the DKA was corrected. An abdominal x-ray was ordered and it revealed findings consistent with constipation. Also, it was thought that because of her chronic constipation, she may have had some bacterial overgrowth that could be causing the leukocytosis. Therefore, Cipro and Flagyl were started intravenously and empirically. She was also given several enemas and laxatives which proved to be successful eventually. At the time of discharge, Cipro and Flagyl were discontinued as her white blood cell count normalized and there was no obvious evidence of an active infection at the time of discharge.  The patient was noted to be mildly anemic. She has microcytosis as well. No further evaluation was pursued during the hospitalization, however, this can be done in the outpatient setting per the discretion of her primary care physician.  Endocrinologist, Dr. Fransico Him was consulted. He made recommendations including discontinuation of metformin, glipizide, and Januvia. Instead, he ordered Lantus at 20 units each bedtime, sliding scale NovoLog before each meal, and scheduled dosing of NovoLog at 8 units with each meal. At the time of discharge, her capillary blood glucose was ranging in the lower 100s.  She was discharged to home in improved and stable condition. She will followup with Dr. Fransico Him and her primary care physician Dr. Renne Crigler as scheduled.    Discharge Exam:  Blood pressure 110/65, pulse 76, temperature 97.5 F (36.4 C), temperature source Oral, resp. rate 14, height 5\' 2"  (1.575 m), weight 57.7 kg (127 lb 3.3 oz), SpO2 100.00%.  Lungs: Clear to auscultation bilaterally. Heart: S1, S2, no murmurs rubs or gallops. Abdomen: Positive bowel sounds, soft, nontender, nondistended. Extremities: No pedal edema.   Discharge Orders     Future Orders Please Complete By Expires   Diet - low sodium heart healthy      Increase activity slowly         Follow-up Information    Follow up with Londell Moh, MD in 2 months.      Follow up with NIDA,GEBRESELASSIE on 08/02/2011. (AT 2:OO PM)    Contact information:   [address] [place] [address] [phone]         Discharge time: 35 minutes.   Signed: Demonie Kassa 07/24/2011, 11:06 AM

## 2011-07-24 NOTE — Progress Notes (Signed)
NAME:  Melissa Schneider, Melissa Schneider             ACCOUNT NO.:  [id number]  MEDICAL RECORD NO.:  [id number]  LOCATION:  IC07                          FACILITY:  [facility]  PHYSICIAN:  Purcell Nails, MD DATE OF BIRTH:  Nov 13, 1955  DATE OF PROCEDURE: DATE OF DISCHARGE:                                PROGRESS NOTE   CHIEF COMPLAINT:  Follow up for type 2 diabetes.  SUBJECTIVE:  She feels better.  She eats very well.  No hypoglycemia. Blood sugars are controlled to near normal.  OBJECTIVE:  VITAL SIGNS:  Blood pressure 110/65, pulse rate 74, temperature 98.2. HEENT:  Well hydrated.  No icterus, no pallor. CHEST:  Clear to auscultation bilaterally. CARDIOVASCULAR:  Normal S1, S2.  No murmur.  No gallop. ABDOMEN:  Soft and nontender. EXTREMITIES:  No edema. CNS:  Nonfocal. SKIN:  Has no rash.  No hyperemia.  Her blood glucose ranged from 100-115.  ASSESSMENT: 1. Type 2 diabetes, chronically uncontrolled with A1c of 15.9%. 2. Diabetic ketoacidosis, resolved.  PLAN:  Continue Lantus 20 units nightly, NovoLog 8 units t.i.d. a.c. plus low-dose sliding scale.  The patient will be seen in 1 week in Endocrine Clinic with me.  I have ordered thyroid function tests which I can follow up in the clinic as well.  She can be discharged from diabetes point of view on the same insulin regimen she is taking currently.  Dear Dr. Sherrie Mustache, thank for the opportunity to participate in the care of this pleasant patient.  I will update you in her progress as an outpatient.          ______________________________ Purcell Nails, MD     GN/MEDQ  D:  07/24/2011  T:  07/24/2011  Job:  [id number]

## 2011-07-24 NOTE — Progress Notes (Signed)
Discharge instructions reviewed with patient regarding insulin administration using sliding scale along with meal coverage. Pt. Correctly identified insulin dosages with various scenarios. She administered insulin dose using flex pen without difficulty. Reviewed the need to always prime the needle prior to giving insulin. Patient did not verbalize any questions or concerns. Reviewed the medications to stop taking (glipizide, metformin, and [place]). She will follow up with Dr. Fransico Him 08/02/11 @ 2:00 pm. Pt. Ambulated, escorted by nurse to main hospital entrance.

## 2011-07-24 NOTE — Progress Notes (Signed)
Patient ID: Melissa Schneider, female   DOB: [birth year], 56 y.o.   MRN: [id number]

## 2011-07-26 NOTE — Progress Notes (Signed)
Utilization review completed.  

## 2011-11-23 ENCOUNTER — Encounter: Payer: Self-pay | Admitting: Pediatrics

## 2011-12-15 HISTORY — PX: COLONOSCOPY: SHX174

## 2012-12-19 ENCOUNTER — Other Ambulatory Visit (HOSPITAL_COMMUNITY)
Admission: RE | Admit: 2012-12-19 | Discharge: 2012-12-19 | Disposition: A | Discharge status: Home / Self Care | Payer: 59 | Source: Ambulatory Visit | Attending: Internal Medicine | Admitting: Internal Medicine

## 2012-12-19 ENCOUNTER — Encounter: Payer: Self-pay | Admitting: Pediatrics

## 2012-12-19 DIAGNOSIS — Z01419 Encounter for gynecological examination (general) (routine) without abnormal findings: Secondary | ICD-10-CM | POA: Insufficient documentation

## 2013-04-11 ENCOUNTER — Emergency Department (HOSPITAL_COMMUNITY)
Admission: EM | Admit: 2013-04-11 | Discharge: 2013-04-11 | Disposition: A | Discharge status: Home / Self Care | Payer: 59 | Source: Home / Self Care | Attending: Emergency Medicine | Admitting: Emergency Medicine

## 2013-04-11 ENCOUNTER — Encounter (HOSPITAL_COMMUNITY): Payer: Self-pay

## 2013-04-11 DIAGNOSIS — T161XXA Foreign body in right ear, initial encounter: Secondary | ICD-10-CM

## 2013-04-11 DIAGNOSIS — T169XXA Foreign body in ear, unspecified ear, initial encounter: Secondary | ICD-10-CM

## 2013-04-11 NOTE — ED Notes (Signed)
Reports she has piece of hearing aid broken off in her right ear; denies pain

## 2013-04-11 NOTE — ED Provider Notes (Signed)
CSN: [id number]     Arrival date & time 04/11/13  0930 History   First MD Initiated Contact with Patient 04/11/13 1002     Chief Complaint  Patient presents with  . Foreign Body in Ear   (Consider location/radiation/quality/duration/timing/severity/associated sxs/prior Treatment) HPI Comments: 57 year old female presents complaining of foreign body in her right ear and her she took her hearing aid out of her ear last night, it and a piece was no longer attached. She feels like she may have it still stuck in her ear but that no one can see it. She says the area is slightly painful. She denies discharge from the ear. She has been wearing hearing aids for 5 years and this is the first time this has happened  Patient is a 57 y.o. female presenting with foreign body in ear.  Foreign Body in Ear Pertinent negatives include no chest pain, no abdominal pain and no shortness of breath.    Past Medical History  Diagnosis Date  . Diabetes mellitus   . Hearing difficulty   . Diabetic gastroparesis 07/23/2011    Suspected  . Anemia 07/23/2011  . Microcytic anemia 07/23/2011   History reviewed. No pertinent past surgical history. History reviewed. No pertinent family history. History  Substance Use Topics  . Smoking status: Never Smoker   . Smokeless tobacco: Not on file  . Alcohol Use: No   OB History   Grav Para Term Preterm Abortions TAB SAB Ect Mult Living                 Review of Systems  Constitutional: Negative for fever and chills.  HENT: Positive for hearing loss (Chronic) and ear pain (pressure from foreign body).   Eyes: Negative for visual disturbance.  Respiratory: Negative for cough and shortness of breath.   Cardiovascular: Negative for chest pain, palpitations and leg swelling.  Gastrointestinal: Negative for nausea, vomiting and abdominal pain.  Endocrine: Negative for polydipsia and polyuria.  Genitourinary: Negative for dysuria, urgency and frequency.  Musculoskeletal:  Negative for myalgias and arthralgias.  Skin: Negative for rash.  Neurological: Negative for dizziness, weakness and light-headedness.    Allergies  Lisinopril  Home Medications   Current Outpatient Rx  Name  Route  Sig  Dispense  Refill  . insulin glargine (LANTUS) 100 UNIT/ML injection   Subcutaneous   Inject 20 Units into the skin at bedtime.           Marland Kitchen levothyroxine (SYNTHROID, LEVOTHROID) 50 MCG tablet   Oral   Take 50 mcg by mouth daily.           Marland Kitchen losartan (COZAAR) 25 MG tablet   Oral   Take 25 mg by mouth daily.           . pravastatin (PRAVACHOL) 40 MG tablet   Oral   Take 40 mg by mouth daily.           . SUMAtriptan (IMITREX) 100 MG tablet   Oral   Take 100 mg by mouth every 2 (two) hours as needed. For migraines         . SUMAtriptan (IMITREX) 6 MG/0.5ML SOLN injection   Subcutaneous   Inject 6 mg into the skin every 2 (two) hours as needed. For migraines          . EXPIRED: insulin aspart (NOVOLOG) 100 UNIT/ML injection   Subcutaneous   Inject 8 Units into the skin 3 (three) times daily with meals.   1 vial  6   . EXPIRED: insulin aspart (NOVOLOG) 100 UNIT/ML injection   Subcutaneous   Inject 0-17 Units into the skin 3 (three) times daily before meals. SLIDING SCALE: FOR BLOOD SUGAR OF 121-150 TAKE 2 UNITS; FOR  BLOOD SUGAR OF 151-200 TAKE 4 UNITS; FOR BLOOD SUGAR OF 201-250 TAKE 7 UNITS; FOR BLOOD SUGAR OF 251-300 TAKE 9 UNITS; FOR BLOOD SUGAR OF 301-350 TAKE 12 UNITS; FOR BLOOD SUGAR OF 351-400 UNITS, TAKE 15 UNITS; FOR BLOOD SUGAR OF GREATER THAN 400 TAKE 17 UNITS AND CALL YOUR DOCTOR.   1 vial   6   . EXPIRED: potassium chloride SA (K-DUR,KLOR-CON) 20 MEQ tablet   Oral   Take 1 tablet (20 mEq total) by mouth 2 (two) times daily.   30 tablet   0    BP 146/73  Pulse 68  Temp(Src) 97.8 F (36.6 C) (Oral)  Resp 16  SpO2 99% Physical Exam  Nursing note and vitals reviewed. Constitutional: She is oriented to person, place, and  time. Vital signs are normal. She appears well-developed and well-nourished. No distress.  HENT:  Head: Normocephalic and atraumatic.  Right Ear: A foreign body (Hearing aid  tip visible in the canal) is present.  Pulmonary/Chest: Effort normal. No respiratory distress.  Neurological: She is alert and oriented to person, place, and time. She has normal strength. Coordination normal.  Skin: Skin is warm and dry. No rash noted. She is not diaphoretic.  Psychiatric: She has a normal mood and affect. Judgment normal.    ED Course  Procedures (including critical care time) Labs Review Labs Reviewed - No data to display Imaging Review No results found.  MDM   1. Foreign body in ear, right, initial encounter    Foreign body has been. Had her place the hearing aid back in and remove it successfully. Followup as needed   Graylon Good, PA-C 04/11/13 1037

## 2013-04-11 NOTE — ED Provider Notes (Signed)
Medical screening examination/treatment/procedure(s) were performed by non-physician practitioner and as supervising physician I was immediately available for consultation/collaboration.  Leslee Home, M.D.  Reuben Likes, MD 04/11/13 1045

## 2013-08-10 ENCOUNTER — Encounter (HOSPITAL_COMMUNITY): Payer: Self-pay | Admitting: Emergency Medicine

## 2013-08-10 ENCOUNTER — Emergency Department (HOSPITAL_COMMUNITY)
Admission: EM | Admit: 2013-08-10 | Discharge: 2013-08-10 | Disposition: A | Discharge status: Home / Self Care | Payer: 59 | Attending: Emergency Medicine | Admitting: Emergency Medicine

## 2013-08-10 ENCOUNTER — Emergency Department (HOSPITAL_COMMUNITY): Payer: 59

## 2013-08-10 DIAGNOSIS — R1031 Right lower quadrant pain: Secondary | ICD-10-CM | POA: Insufficient documentation

## 2013-08-10 DIAGNOSIS — Z79899 Other long term (current) drug therapy: Secondary | ICD-10-CM | POA: Insufficient documentation

## 2013-08-10 DIAGNOSIS — E1149 Type 2 diabetes mellitus with other diabetic neurological complication: Secondary | ICD-10-CM | POA: Insufficient documentation

## 2013-08-10 DIAGNOSIS — Z794 Long term (current) use of insulin: Secondary | ICD-10-CM | POA: Insufficient documentation

## 2013-08-10 DIAGNOSIS — Z862 Personal history of diseases of the blood and blood-forming organs and certain disorders involving the immune mechanism: Secondary | ICD-10-CM | POA: Insufficient documentation

## 2013-08-10 DIAGNOSIS — R109 Unspecified abdominal pain: Secondary | ICD-10-CM

## 2013-08-10 DIAGNOSIS — N201 Calculus of ureter: Secondary | ICD-10-CM

## 2013-08-10 DIAGNOSIS — K3184 Gastroparesis: Secondary | ICD-10-CM | POA: Insufficient documentation

## 2013-08-10 DIAGNOSIS — Z8669 Personal history of other diseases of the nervous system and sense organs: Secondary | ICD-10-CM | POA: Insufficient documentation

## 2013-08-10 DIAGNOSIS — R6883 Chills (without fever): Secondary | ICD-10-CM | POA: Insufficient documentation

## 2013-08-10 LAB — CBC WITH DIFFERENTIAL/PLATELET
Basophils Absolute: 0 10*3/uL (ref 0.0–0.1)
Basophils Relative: 0 % (ref 0–1)
Eosinophils Absolute: 0 10*3/uL (ref 0.0–0.7)
Eosinophils Relative: 0 % (ref 0–5)
HCT: 37.5 % (ref 36.0–46.0)
Hemoglobin: 12.1 g/dL (ref 12.0–15.0)
Lymphocytes Relative: 5 % — ABNORMAL LOW (ref 12–46)
Lymphs Abs: 1 10*3/uL (ref 0.7–4.0)
MCH: 21.2 pg — ABNORMAL LOW (ref 26.0–34.0)
MCHC: 32.3 g/dL (ref 30.0–36.0)
MCV: 65.6 fL — ABNORMAL LOW (ref 78.0–100.0)
Monocytes Absolute: 0.6 10*3/uL (ref 0.1–1.0)
Monocytes Relative: 3 % (ref 3–12)
Neutro Abs: 17.4 10*3/uL — ABNORMAL HIGH (ref 1.7–7.7)
Neutrophils Relative %: 92 % — ABNORMAL HIGH (ref 43–77)
Platelets: 321 10*3/uL (ref 150–400)
RBC: 5.72 MIL/uL — ABNORMAL HIGH (ref 3.87–5.11)
RDW: 14.8 % (ref 11.5–15.5)
WBC: 19 10*3/uL — ABNORMAL HIGH (ref 4.0–10.5)

## 2013-08-10 LAB — COMPREHENSIVE METABOLIC PANEL
ALT: 12 U/L (ref 0–35)
AST: 17 U/L (ref 0–37)
Albumin: 3.8 g/dL (ref 3.5–5.2)
Alkaline Phosphatase: 119 U/L — ABNORMAL HIGH (ref 39–117)
BUN: 21 mg/dL (ref 6–23)
CO2: 18 mEq/L — ABNORMAL LOW (ref 19–32)
Calcium: 9.1 mg/dL (ref 8.4–10.5)
Chloride: 99 mEq/L (ref 96–112)
Creatinine, Ser: 0.79 mg/dL (ref 0.50–1.10)
GFR calc Af Amer: >90 mL/min (ref >90)
GFR calc non Af Amer: >90 mL/min (ref >90)
Glucose, Bld: 266 mg/dL — ABNORMAL HIGH (ref 70–99)
Potassium: 4.1 mEq/L (ref 3.7–5.3)
Sodium: 138 mEq/L (ref 137–147)
Total Bilirubin: 0.3 mg/dL (ref 0.3–1.2)
Total Protein: 7.8 g/dL (ref 6.0–8.3)

## 2013-08-10 LAB — URINALYSIS, ROUTINE W REFLEX MICROSCOPIC
Bilirubin Urine: NEGATIVE
Glucose, UA: >1000 mg/dL — AB
Ketones, ur: 40 mg/dL — AB
Leukocytes, UA: NEGATIVE
Nitrite: NEGATIVE
Protein, ur: NEGATIVE mg/dL
Specific Gravity, Urine: 1.01 (ref 1.005–1.030)
Urobilinogen, UA: 0.2 mg/dL (ref 0.0–1.0)
pH: 6.5 (ref 5.0–8.0)

## 2013-08-10 LAB — URINE MICROSCOPIC-ADD ON

## 2013-08-10 LAB — LIPASE, BLOOD: Lipase: 32 U/L (ref 11–59)

## 2013-08-10 MED ORDER — SODIUM CHLORIDE 0.9 % IV BOLUS (SEPSIS)
1000.0000 mL | Freq: Once | INTRAVENOUS | Status: AC
  Administered 2013-08-10: 1000 mL via INTRAVENOUS

## 2013-08-10 MED ORDER — HYDROMORPHONE HCL PF 1 MG/ML IJ SOLN
1.0000 mg | Freq: Once | INTRAMUSCULAR | Status: AC
  Administered 2013-08-10: 1 mg via INTRAVENOUS
  Filled 2013-08-10: qty 1

## 2013-08-10 MED ORDER — OXYCODONE-ACETAMINOPHEN 5-325 MG PO TABS
1.0000 | ORAL_TABLET | ORAL | Status: DC | PRN
Start: 2013-08-10 — End: 2013-08-22

## 2013-08-10 MED ORDER — ONDANSETRON HCL 4 MG/2ML IJ SOLN
4.0000 mg | Freq: Once | INTRAMUSCULAR | Status: AC
  Administered 2013-08-10: 4 mg via INTRAMUSCULAR
  Filled 2013-08-10: qty 2

## 2013-08-10 MED ORDER — KETOROLAC TROMETHAMINE 30 MG/ML IJ SOLN
30.0000 mg | Freq: Once | INTRAMUSCULAR | Status: AC
  Administered 2013-08-10: 30 mg via INTRAVENOUS
  Filled 2013-08-10: qty 1

## 2013-08-10 MED ORDER — CIPROFLOXACIN HCL 500 MG PO TABS: 500.0000 mg | ORAL_TABLET | Freq: Two times a day (BID) | ORAL | Status: DC

## 2013-08-10 MED ORDER — OXYCODONE-ACETAMINOPHEN 5-325 MG PO TABS: 1.0000 | ORAL_TABLET | ORAL | Status: DC | PRN

## 2013-08-10 MED ORDER — ONDANSETRON HCL 4 MG PO TABS: 4.0000 mg | ORAL_TABLET | Freq: Four times a day (QID) | ORAL | Status: DC

## 2013-08-10 NOTE — ED Notes (Signed)
Pt with sudden abd pain and N/V since 1030 today, denies hx of kidney stones

## 2013-08-10 NOTE — ED Provider Notes (Signed)
CSN: [id number]     Arrival date & time 08/10/13  1330 History  This chart was scribed for Virgel Manifold, MD by Zettie Pho, ED Scribe. This patient was seen in room APA06/APA06 and the patient's care was started at 3:12 PM.    Chief Complaint  Patient presents with  . Abdominal Pain  . Flank Pain   The history is provided by the patient. No language interpreter was used.   HPI Comments: Melissa Schneider is a 58 y.o. female who presents to the Emergency Department complaining of a constant, aching pain to the RLQ of the abdomen that radiates to the mid-back with sudden onset this morning that has been progressively worsening. She denies any exacerbating or alleviating factors. She reports some associated chills, nausea, and emesis. She denies taking any medications at home to treat her symptoms. Patient is requesting pain medication. She denies dysuria, fever. Patient has a history of DM and diabetic gastroparesis.  Past Medical History  Diagnosis Date  . Diabetes mellitus   . Hearing difficulty   . Diabetic gastroparesis 07/23/2011    Suspected  . Anemia 07/23/2011  . Microcytic anemia 07/23/2011   History reviewed. No pertinent past surgical history. No family history on file. History  Substance Use Topics  . Smoking status: Never Smoker   . Smokeless tobacco: Not on file  . Alcohol Use: No   OB History   Grav Para Term Preterm Abortions TAB SAB Ect Mult Living                 Review of Systems  Constitutional: Positive for chills. Negative for fever.  Gastrointestinal: Positive for nausea, vomiting and abdominal pain.  Genitourinary: Negative for dysuria.  All other systems reviewed and are negative.    Allergies  Lisinopril  Home Medications   Current Outpatient Rx  Name  Route  Sig  Dispense  Refill  . Canagliflozin (INVOKANA) 100 MG TABS   Oral   Take 200 tablets by mouth daily.         . insulin aspart (NOVOLOG) 100 UNIT/ML injection   Subcutaneous   Inject  0-17 Units into the skin 3 (three) times daily before meals. SLIDING SCALE: FOR BLOOD SUGAR OF 121-150 TAKE 2 UNITS; FOR  BLOOD SUGAR OF 151-200 TAKE 4 UNITS; FOR BLOOD SUGAR OF 201-250 TAKE 7 UNITS; FOR BLOOD SUGAR OF 251-300 TAKE 9 UNITS; FOR BLOOD SUGAR OF 301-350 TAKE 12 UNITS; FOR BLOOD SUGAR OF 351-400 UNITS, TAKE 15 UNITS; FOR BLOOD SUGAR OF GREATER THAN 400 TAKE 17 UNITS AND CALL YOUR DOCTOR.   1 vial   6   . insulin glargine (LANTUS) 100 UNIT/ML injection   Subcutaneous   Inject 14 Units into the skin at bedtime.          Marland Kitchen levothyroxine (SYNTHROID, LEVOTHROID) 50 MCG tablet   Oral   Take 50 mcg by mouth daily.           Marland Kitchen losartan (COZAAR) 25 MG tablet   Oral   Take 25 mg by mouth daily.           . metFORMIN (GLUCOPHAGE) 1000 MG tablet   Oral   Take 1,000 mg by mouth 2 (two) times daily with a meal.         . pravastatin (PRAVACHOL) 40 MG tablet   Oral   Take 40 mg by mouth daily.           . SUMAtriptan (IMITREX) 100 MG tablet  Oral   Take 100 mg by mouth every 2 (two) hours as needed. For migraines         . SUMAtriptan (IMITREX) 6 MG/0.5ML SOLN injection   Subcutaneous   Inject 6 mg into the skin every 2 (two) hours as needed. For migraines          . EXPIRED: potassium chloride SA (K-DUR,KLOR-CON) 20 MEQ tablet   Oral   Take 1 tablet (20 mEq total) by mouth 2 (two) times daily.   30 tablet   0    Triage Vitals: BP 139/106  Pulse 76  Resp 20  SpO2 99%  Physical Exam  Nursing note and vitals reviewed. Constitutional: She is oriented to person, place, and time. She appears well-developed and well-nourished. No distress.  Appears uncomfortable.   HENT:  Head: Normocephalic and atraumatic.  Eyes: Conjunctivae are normal.  Neck: Normal range of motion. Neck supple.  Cardiovascular: Normal rate, regular rhythm and normal heart sounds.   Pulmonary/Chest: Effort normal and breath sounds normal. No respiratory distress.  Abdominal: Soft. Bowel  sounds are normal. She exhibits no distension. There is tenderness. There is no rebound and no guarding.  Mild tenderness to palpation to the RLQ. No CVA tenderness.   Musculoskeletal: Normal range of motion.  Neurological: She is alert and oriented to person, place, and time.  Skin: Skin is warm and dry.  Psychiatric: She has a normal mood and affect. Her behavior is normal.    ED Course  Procedures (including critical care time)  DIAGNOSTIC STUDIES: Oxygen Saturation is 99% on room air, normal by my interpretation.    COORDINATION OF CARE: 3:16 PM- Ordered blood labs, UA, and a CT of the abdomen. Ordered IV fluids, Dilaudid, Zofran, and Toradol to manage symptoms. Discussed that symptoms are likely due to kidney stones. Discussed treatment plan with patient at bedside and patient verbalized agreement.     Labs Review Labs Reviewed  URINALYSIS, ROUTINE W REFLEX MICROSCOPIC - Abnormal; Notable for the following:    Glucose, UA >1000 (*)    Hgb urine dipstick TRACE (*)    Ketones, ur 40 (*)    All other components within normal limits  CBC WITH DIFFERENTIAL - Abnormal; Notable for the following:    WBC 19.0 (*)    RBC 5.72 (*)    MCV 65.6 (*)    MCH 21.2 (*)    Neutrophils Relative % 92 (*)    Lymphocytes Relative 5 (*)    Neutro Abs 17.4 (*)    All other components within normal limits  COMPREHENSIVE METABOLIC PANEL - Abnormal; Notable for the following:    CO2 18 (*)    Glucose, Bld 266 (*)    Alkaline Phosphatase 119 (*)    All other components within normal limits  URINE MICROSCOPIC-ADD ON - Abnormal; Notable for the following:    Bacteria, UA FEW (*)    All other components within normal limits  URINE CULTURE  LIPASE, BLOOD   Imaging Review Ct Abdomen Pelvis Wo Contrast  08/10/2013   CLINICAL DATA:  Right flank pain, vomiting, history diabetes, gastroparesis, anemia  EXAM: CT ABDOMEN AND PELVIS WITHOUT CONTRAST  TECHNIQUE: Multidetector CT imaging of the abdomen  and pelvis was performed following the standard protocol without intravenous contrast. Sagittal and coronal MPR images reconstructed from axial data set.  COMPARISON:  None  FINDINGS: Minimal bibasilar atelectasis.  Right hydronephrosis secondary to a 6 mm diameter calculus at the proximal right ureter image 44.  Right kidney is enlarged with significant perinephric edema.  Distal ureters decompressed.  No additional urinary tract calcification.  Bladder unremarkable.  Within limits of a nonenhanced exam, no focal abnormalities of the liver, spleen, left kidney, pancreas, or adrenal glands identified.  Stomach and bowel loops unremarkable for technique.  Unremarkable uterus, adnexae and appendix.  No mass, adenopathy, free fluid, hernia, or acute bone lesion.  IMPRESSION: Right hydronephrosis secondary to a 6 mm diameter proximal right ureteral calculus.   Electronically Signed   By: Lavonia Dana M.D.   On: 08/10/2013 18:37    EKG Interpretation   None       MDM   1. Right flank pain   2. Right ureteral stone      6:22 PM IT issues with imaging resulting. CT per my read with R proximal ureteral stone with hydro and perinephric stranding. Discussed with pt. Symptoms improved. Anticipate DC with expectant management/pain control.   I personally preformed the services scribed in my presence. The recorded information has been reviewed is accurate. Virgel Manifold, MD.     Virgel Manifold, MD 08/12/13 [phone]

## 2013-08-10 NOTE — ED Notes (Signed)
rlq pain that radiates to back

## 2013-08-12 LAB — URINE CULTURE: Colony Count: >=100000

## 2013-08-13 ENCOUNTER — Emergency Department (HOSPITAL_COMMUNITY): Payer: 59

## 2013-08-13 ENCOUNTER — Inpatient Hospital Stay (HOSPITAL_COMMUNITY)
Admission: EM | Admit: 2013-08-13 | Discharge: 2013-08-16 | DRG: 638 | Disposition: A | Discharge status: Home / Self Care | Payer: 59 | Attending: Internal Medicine | Admitting: Internal Medicine

## 2013-08-13 ENCOUNTER — Encounter (HOSPITAL_COMMUNITY)
Admission: EM | Disposition: A | Discharge status: Home / Self Care | Payer: Self-pay | Source: Home / Self Care | Attending: Urology

## 2013-08-13 ENCOUNTER — Inpatient Hospital Stay (HOSPITAL_COMMUNITY): Payer: 59

## 2013-08-13 ENCOUNTER — Encounter (HOSPITAL_COMMUNITY): Payer: Self-pay | Admitting: Emergency Medicine

## 2013-08-13 ENCOUNTER — Encounter (HOSPITAL_COMMUNITY): Payer: 59 | Admitting: Anesthesiology

## 2013-08-13 ENCOUNTER — Inpatient Hospital Stay (HOSPITAL_COMMUNITY): Payer: 59 | Admitting: Anesthesiology

## 2013-08-13 DIAGNOSIS — K3184 Gastroparesis: Secondary | ICD-10-CM | POA: Diagnosis present

## 2013-08-13 DIAGNOSIS — E1165 Type 2 diabetes mellitus with hyperglycemia: Secondary | ICD-10-CM | POA: Diagnosis present

## 2013-08-13 DIAGNOSIS — N179 Acute kidney failure, unspecified: Secondary | ICD-10-CM | POA: Insufficient documentation

## 2013-08-13 DIAGNOSIS — B961 Klebsiella pneumoniae [K. pneumoniae] as the cause of diseases classified elsewhere: Secondary | ICD-10-CM | POA: Diagnosis present

## 2013-08-13 DIAGNOSIS — H9190 Unspecified hearing loss, unspecified ear: Secondary | ICD-10-CM | POA: Diagnosis present

## 2013-08-13 DIAGNOSIS — N201 Calculus of ureter: Secondary | ICD-10-CM | POA: Diagnosis present

## 2013-08-13 DIAGNOSIS — I10 Essential (primary) hypertension: Secondary | ICD-10-CM | POA: Diagnosis present

## 2013-08-13 DIAGNOSIS — IMO0002 Reserved for concepts with insufficient information to code with codable children: Secondary | ICD-10-CM | POA: Diagnosis present

## 2013-08-13 DIAGNOSIS — D72829 Elevated white blood cell count, unspecified: Secondary | ICD-10-CM | POA: Diagnosis present

## 2013-08-13 DIAGNOSIS — E1110 Type 2 diabetes mellitus with ketoacidosis without coma: Secondary | ICD-10-CM | POA: Diagnosis present

## 2013-08-13 DIAGNOSIS — E872 Acidosis, unspecified: Secondary | ICD-10-CM

## 2013-08-13 DIAGNOSIS — E785 Hyperlipidemia, unspecified: Secondary | ICD-10-CM | POA: Diagnosis present

## 2013-08-13 DIAGNOSIS — N1330 Unspecified hydronephrosis: Secondary | ICD-10-CM | POA: Diagnosis present

## 2013-08-13 DIAGNOSIS — N390 Urinary tract infection, site not specified: Secondary | ICD-10-CM

## 2013-08-13 DIAGNOSIS — E1310 Other specified diabetes mellitus with ketoacidosis without coma: Principal | ICD-10-CM | POA: Diagnosis present

## 2013-08-13 DIAGNOSIS — E7800 Pure hypercholesterolemia, unspecified: Secondary | ICD-10-CM | POA: Diagnosis present

## 2013-08-13 DIAGNOSIS — E1149 Type 2 diabetes mellitus with other diabetic neurological complication: Secondary | ICD-10-CM | POA: Diagnosis present

## 2013-08-13 DIAGNOSIS — Z794 Long term (current) use of insulin: Secondary | ICD-10-CM

## 2013-08-13 DIAGNOSIS — N139 Obstructive and reflux uropathy, unspecified: Secondary | ICD-10-CM | POA: Diagnosis present

## 2013-08-13 DIAGNOSIS — N12 Tubulo-interstitial nephritis, not specified as acute or chronic: Secondary | ICD-10-CM | POA: Diagnosis present

## 2013-08-13 DIAGNOSIS — E876 Hypokalemia: Secondary | ICD-10-CM | POA: Diagnosis present

## 2013-08-13 DIAGNOSIS — N200 Calculus of kidney: Secondary | ICD-10-CM | POA: Diagnosis present

## 2013-08-13 DIAGNOSIS — E039 Hypothyroidism, unspecified: Secondary | ICD-10-CM | POA: Diagnosis present

## 2013-08-13 HISTORY — PX: CYSTOSCOPY W/ URETERAL STENT PLACEMENT: SHX1429

## 2013-08-13 HISTORY — DX: Headache: R51

## 2013-08-13 LAB — BASIC METABOLIC PANEL
BUN: 34 mg/dL — AB (ref 6–23)
BUN: 31 mg/dL — AB (ref 6–23)
BUN: 29 mg/dL — AB (ref 6–23)
BUN: 26 mg/dL — AB (ref 6–23)
CALCIUM: 8.8 mg/dL (ref 8.4–10.5)
CALCIUM: 8.6 mg/dL (ref 8.4–10.5)
CALCIUM: 8.9 mg/dL (ref 8.4–10.5)
CHLORIDE: 90 meq/L — AB (ref 96–112)
CO2: 11 meq/L — AB (ref 19–32)
CO2: 11 meq/L — AB (ref 19–32)
CO2: 13 mEq/L — ABNORMAL LOW (ref 19–32)
CO2: 12 mEq/L — ABNORMAL LOW (ref 19–32)
CREATININE: 1.01 mg/dL (ref 0.50–1.10)
CREATININE: 0.95 mg/dL (ref 0.50–1.10)
Calcium: 9.8 mg/dL (ref 8.4–10.5)
Chloride: 96 mEq/L (ref 96–112)
Chloride: 102 mEq/L (ref 96–112)
Chloride: 101 mEq/L (ref 96–112)
Creatinine, Ser: 1.17 mg/dL — ABNORMAL HIGH (ref 0.50–1.10)
Creatinine, Ser: 0.9 mg/dL (ref 0.50–1.10)
GFR calc Af Amer: 59 mL/min — ABNORMAL LOW (ref >90)
GFR calc Af Amer: 71 mL/min — ABNORMAL LOW (ref >90)
GFR calc non Af Amer: 51 mL/min — ABNORMAL LOW (ref >90)
GFR calc non Af Amer: 70 mL/min — ABNORMAL LOW (ref >90)
GFR, EST AFRICAN AMERICAN: 76 mL/min — AB (ref >90)
GFR, EST AFRICAN AMERICAN: 81 mL/min — AB (ref >90)
GFR, EST NON AFRICAN AMERICAN: 61 mL/min — AB (ref >90)
GFR, EST NON AFRICAN AMERICAN: 66 mL/min — AB (ref >90)
GLUCOSE: 255 mg/dL — AB (ref 70–99)
GLUCOSE: 217 mg/dL — AB (ref 70–99)
GLUCOSE: 140 mg/dL — AB (ref 70–99)
Glucose, Bld: 196 mg/dL — ABNORMAL HIGH (ref 70–99)
Potassium: 4.3 mEq/L (ref 3.7–5.3)
Potassium: 4.1 mEq/L (ref 3.7–5.3)
Potassium: 4.1 mEq/L (ref 3.7–5.3)
Potassium: 3.9 mEq/L (ref 3.7–5.3)
Sodium: 133 mEq/L — ABNORMAL LOW (ref 137–147)
Sodium: 135 mEq/L — ABNORMAL LOW (ref 137–147)
Sodium: 138 mEq/L (ref 137–147)
Sodium: 138 mEq/L (ref 137–147)

## 2013-08-13 LAB — COMPREHENSIVE METABOLIC PANEL
ALBUMIN: 2.6 g/dL — AB (ref 3.5–5.2)
ALT: 13 U/L (ref 0–35)
AST: 20 U/L (ref 0–37)
Alkaline Phosphatase: 147 U/L — ABNORMAL HIGH (ref 39–117)
BUN: 27 mg/dL — ABNORMAL HIGH (ref 6–23)
CO2: 13 meq/L — AB (ref 19–32)
Calcium: 8.8 mg/dL (ref 8.4–10.5)
Chloride: 102 mEq/L (ref 96–112)
Creatinine, Ser: 0.91 mg/dL (ref 0.50–1.10)
GFR calc Af Amer: 80 mL/min — ABNORMAL LOW (ref >90)
GFR, EST NON AFRICAN AMERICAN: 69 mL/min — AB (ref >90)
Glucose, Bld: 160 mg/dL — ABNORMAL HIGH (ref 70–99)
POTASSIUM: 4.1 meq/L (ref 3.7–5.3)
SODIUM: 138 meq/L (ref 137–147)
Total Bilirubin: 0.2 mg/dL — ABNORMAL LOW (ref 0.3–1.2)
Total Protein: 6.5 g/dL (ref 6.0–8.3)

## 2013-08-13 LAB — URINALYSIS, ROUTINE W REFLEX MICROSCOPIC
GLUCOSE, UA: 500 mg/dL — AB
Ketones, ur: >80 mg/dL — AB
Leukocytes, UA: NEGATIVE
NITRITE: NEGATIVE
PH: 5.5 (ref 5.0–8.0)
SPECIFIC GRAVITY, URINE: 1.025 (ref 1.005–1.030)
Urobilinogen, UA: 0.2 mg/dL (ref 0.0–1.0)

## 2013-08-13 LAB — CBC WITH DIFFERENTIAL/PLATELET
BASOS ABS: 0 10*3/uL (ref 0.0–0.1)
Basophils Absolute: 0 10*3/uL (ref 0.0–0.1)
Basophils Relative: 0 % (ref 0–1)
Basophils Relative: 0 % (ref 0–1)
Eosinophils Absolute: 0 10*3/uL (ref 0.0–0.7)
Eosinophils Absolute: 0 10*3/uL (ref 0.0–0.7)
Eosinophils Relative: 0 % (ref 0–5)
Eosinophils Relative: 0 % (ref 0–5)
HCT: 32 % — ABNORMAL LOW (ref 36.0–46.0)
HEMATOCRIT: 36.4 % (ref 36.0–46.0)
HEMOGLOBIN: 12.1 g/dL (ref 12.0–15.0)
Hemoglobin: 10.4 g/dL — ABNORMAL LOW (ref 12.0–15.0)
LYMPHS ABS: 1.5 10*3/uL (ref 0.7–4.0)
LYMPHS ABS: 1.2 10*3/uL (ref 0.7–4.0)
Lymphocytes Relative: 6 % — ABNORMAL LOW (ref 12–46)
Lymphocytes Relative: 6 % — ABNORMAL LOW (ref 12–46)
MCH: 21.9 pg — AB (ref 26.0–34.0)
MCH: 21.5 pg — ABNORMAL LOW (ref 26.0–34.0)
MCHC: 33.2 g/dL (ref 30.0–36.0)
MCHC: 32.5 g/dL (ref 30.0–36.0)
MCV: 65.9 fL — ABNORMAL LOW (ref 78.0–100.0)
MCV: 66.3 fL — AB (ref 78.0–100.0)
MONO ABS: 1.7 10*3/uL — AB (ref 0.1–1.0)
MONOS PCT: 7 % (ref 3–12)
MONOS PCT: 12 % (ref 3–12)
Monocytes Absolute: 2.3 10*3/uL — ABNORMAL HIGH (ref 0.1–1.0)
NEUTROS ABS: 22.6 10*3/uL — AB (ref 1.7–7.7)
NEUTROS ABS: 16 10*3/uL — AB (ref 1.7–7.7)
Neutrophils Relative %: 88 % — ABNORMAL HIGH (ref 43–77)
Neutrophils Relative %: 82 % — ABNORMAL HIGH (ref 43–77)
Platelets: 252 10*3/uL (ref 150–400)
Platelets: 244 10*3/uL (ref 150–400)
RBC: 5.52 MIL/uL — AB (ref 3.87–5.11)
RBC: 4.83 MIL/uL (ref 3.87–5.11)
RDW: 15.7 % — ABNORMAL HIGH (ref 11.5–15.5)
RDW: 15.5 % (ref 11.5–15.5)
WBC Morphology: INCREASED
WBC: 25.8 10*3/uL — ABNORMAL HIGH (ref 4.0–10.5)
WBC: 19.5 10*3/uL — ABNORMAL HIGH (ref 4.0–10.5)

## 2013-08-13 LAB — GLUCOSE, CAPILLARY
GLUCOSE-CAPILLARY: 135 mg/dL — AB (ref 70–99)
GLUCOSE-CAPILLARY: 191 mg/dL — AB (ref 70–99)
GLUCOSE-CAPILLARY: 152 mg/dL — AB (ref 70–99)
GLUCOSE-CAPILLARY: 155 mg/dL — AB (ref 70–99)
GLUCOSE-CAPILLARY: 125 mg/dL — AB (ref 70–99)
GLUCOSE-CAPILLARY: 122 mg/dL — AB (ref 70–99)
Glucose-Capillary: 114 mg/dL — ABNORMAL HIGH (ref 70–99)
Glucose-Capillary: 120 mg/dL — ABNORMAL HIGH (ref 70–99)
Glucose-Capillary: 138 mg/dL — ABNORMAL HIGH (ref 70–99)
Glucose-Capillary: 139 mg/dL — ABNORMAL HIGH (ref 70–99)
Glucose-Capillary: 160 mg/dL — ABNORMAL HIGH (ref 70–99)
Glucose-Capillary: 163 mg/dL — ABNORMAL HIGH (ref 70–99)
Glucose-Capillary: 163 mg/dL — ABNORMAL HIGH (ref 70–99)

## 2013-08-13 LAB — MAGNESIUM: Magnesium: 1.8 mg/dL (ref 1.5–2.5)

## 2013-08-13 LAB — URINE MICROSCOPIC-ADD ON

## 2013-08-13 LAB — SURGICAL PCR SCREEN
MRSA, PCR: NEGATIVE
STAPHYLOCOCCUS AUREUS: NEGATIVE

## 2013-08-13 LAB — PROTIME-INR
INR: 1.22 (ref 0.00–1.49)
PROTHROMBIN TIME: 15.1 s (ref 11.6–15.2)

## 2013-08-13 LAB — LACTIC ACID, PLASMA: LACTIC ACID, VENOUS: 0.9 mmol/L (ref 0.5–2.2)

## 2013-08-13 LAB — LIPASE, BLOOD: Lipase: 10 U/L — ABNORMAL LOW (ref 11–59)

## 2013-08-13 LAB — APTT: APTT: 25 s (ref 24–37)

## 2013-08-13 LAB — PHOSPHORUS: Phosphorus: 2.3 mg/dL (ref 2.3–4.6)

## 2013-08-13 SURGERY — CYSTOSCOPY, WITH RETROGRADE PYELOGRAM AND URETERAL STENT INSERTION
Anesthesia: Monitor Anesthesia Care | Laterality: Right

## 2013-08-13 MED ORDER — MIDAZOLAM HCL 5 MG/5ML IJ SOLN
INTRAMUSCULAR | Status: DC | PRN
  Administered 2013-08-13 (×4): 1 mg via INTRAVENOUS
  Administered 2013-08-13: 2 mg via INTRAVENOUS

## 2013-08-13 MED ORDER — SODIUM CHLORIDE 0.9 % IR SOLN
Status: DC | PRN
  Administered 2013-08-13 (×2): 3000 mL

## 2013-08-13 MED ORDER — PROPOFOL INFUSION 10 MG/ML OPTIME
INTRAVENOUS | Status: DC | PRN
  Administered 2013-08-13: 35 ug/kg/min via INTRAVENOUS

## 2013-08-13 MED ORDER — STERILE WATER FOR IRRIGATION IR SOLN
Status: DC | PRN
  Administered 2013-08-13: 1000 mL

## 2013-08-13 MED ORDER — SODIUM CHLORIDE 0.9 % IV BOLUS (SEPSIS)
1000.0000 mL | Freq: Once | INTRAVENOUS | Status: AC
  Administered 2013-08-13: 1000 mL via INTRAVENOUS

## 2013-08-13 MED ORDER — MIDAZOLAM HCL 2 MG/2ML IJ SOLN
INTRAMUSCULAR | Status: AC
  Filled 2013-08-13: qty 2

## 2013-08-13 MED ORDER — SODIUM CHLORIDE 0.9 % IV SOLN
1000.0000 mL | Freq: Once | INTRAVENOUS | Status: AC
  Administered 2013-08-13: 1000 mL via INTRAVENOUS

## 2013-08-13 MED ORDER — SODIUM CHLORIDE 0.9 % IV SOLN
INTRAVENOUS | Status: DC
  Administered 2013-08-13: 1 [IU]/h via INTRAVENOUS
  Filled 2013-08-13: qty 1

## 2013-08-13 MED ORDER — FENTANYL CITRATE 0.05 MG/ML IJ SOLN
INTRAMUSCULAR | Status: DC | PRN
  Administered 2013-08-13: 25 ug via INTRAVENOUS
  Administered 2013-08-13: 50 ug via INTRAVENOUS
  Administered 2013-08-13: 25 ug via INTRAVENOUS

## 2013-08-13 MED ORDER — PROPOFOL 10 MG/ML IV EMUL
INTRAVENOUS | Status: AC
  Filled 2013-08-13: qty 20

## 2013-08-13 MED ORDER — LIDOCAINE VISCOUS 2 % MT SOLN
OROMUCOSAL | Status: DC | PRN
  Administered 2013-08-13: 1

## 2013-08-13 MED ORDER — INSULIN ASPART 100 UNIT/ML ~~LOC~~ SOLN
5.0000 [IU] | Freq: Once | SUBCUTANEOUS | Status: AC
  Administered 2013-08-13: 5 [IU] via INTRAVENOUS
  Filled 2013-08-13: qty 1

## 2013-08-13 MED ORDER — ONDANSETRON HCL 4 MG/2ML IJ SOLN
4.0000 mg | Freq: Four times a day (QID) | INTRAMUSCULAR | Status: DC | PRN
  Administered 2013-08-14: 4 mg via INTRAVENOUS
  Filled 2013-08-13: qty 2

## 2013-08-13 MED ORDER — HYDROMORPHONE HCL PF 1 MG/ML IJ SOLN
1.0000 mg | Freq: Once | INTRAMUSCULAR | Status: AC
  Administered 2013-08-13: 1 mg via INTRAVENOUS
  Filled 2013-08-13: qty 1

## 2013-08-13 MED ORDER — ACETAMINOPHEN 650 MG RE SUPP: 650.0000 mg | Freq: Four times a day (QID) | RECTAL | Status: DC | PRN

## 2013-08-13 MED ORDER — POTASSIUM CHLORIDE 10 MEQ/100ML IV SOLN
10.0000 meq | INTRAVENOUS | Status: AC
  Administered 2013-08-13 (×2): 10 meq via INTRAVENOUS
  Filled 2013-08-13 (×2): qty 100

## 2013-08-13 MED ORDER — CANAGLIFLOZIN 100 MG PO TABS
300.0000 mg | ORAL_TABLET | Freq: Every day | ORAL | Status: DC
  Administered 2013-08-13 – 2013-08-14 (×2): 300 mg via ORAL
  Filled 2013-08-13 (×5): qty 3

## 2013-08-13 MED ORDER — DEXTROSE 5 % IV SOLN
1.0000 g | Freq: Once | INTRAVENOUS | Status: AC
  Administered 2013-08-13: 1 g via INTRAVENOUS
  Filled 2013-08-13: qty 10

## 2013-08-13 MED ORDER — SODIUM CHLORIDE 0.9 % IV SOLN
INTRAVENOUS | Status: AC
  Administered 2013-08-13: 10:00:00 via INTRAVENOUS

## 2013-08-13 MED ORDER — ONDANSETRON HCL 4 MG/2ML IJ SOLN
INTRAMUSCULAR | Status: DC | PRN
  Administered 2013-08-13: 4 mg via INTRAVENOUS

## 2013-08-13 MED ORDER — INSULIN REGULAR HUMAN 100 UNIT/ML IJ SOLN
INTRAMUSCULAR | Status: DC
  Filled 2013-08-13: qty 1

## 2013-08-13 MED ORDER — SUMATRIPTAN SUCCINATE 50 MG PO TABS
100.0000 mg | ORAL_TABLET | ORAL | Status: DC | PRN
  Administered 2013-08-14 – 2013-08-15 (×2): 100 mg via ORAL
  Filled 2013-08-13: qty 1

## 2013-08-13 MED ORDER — ONDANSETRON HCL 4 MG/2ML IJ SOLN: 4.0000 mg | Freq: Once | INTRAMUSCULAR | Status: DC

## 2013-08-13 MED ORDER — DEXTROSE 5 % IV SOLN
1.0000 g | INTRAVENOUS | Status: DC
  Administered 2013-08-14 – 2013-08-16 (×3): 1 g via INTRAVENOUS
  Filled 2013-08-13 (×4): qty 10

## 2013-08-13 MED ORDER — LEVOTHYROXINE SODIUM 50 MCG PO TABS
50.0000 ug | ORAL_TABLET | Freq: Every day | ORAL | Status: DC
  Administered 2013-08-13 – 2013-08-16 (×4): 50 ug via ORAL
  Filled 2013-08-13 (×2): qty 2
  Filled 2013-08-13: qty 1
  Filled 2013-08-13: qty 2

## 2013-08-13 MED ORDER — SODIUM CHLORIDE 0.9 % IV SOLN: INTRAVENOUS | Status: DC

## 2013-08-13 MED ORDER — ONDANSETRON HCL 4 MG/2ML IJ SOLN
INTRAMUSCULAR | Status: AC
  Filled 2013-08-13: qty 2

## 2013-08-13 MED ORDER — SIMVASTATIN 20 MG PO TABS
20.0000 mg | ORAL_TABLET | Freq: Every day | ORAL | Status: DC
  Administered 2013-08-13 – 2013-08-15 (×3): 20 mg via ORAL
  Filled 2013-08-13 (×3): qty 1

## 2013-08-13 MED ORDER — LOSARTAN POTASSIUM 50 MG PO TABS
25.0000 mg | ORAL_TABLET | Freq: Every day | ORAL | Status: DC
  Administered 2013-08-13 – 2013-08-16 (×4): 25 mg via ORAL
  Filled 2013-08-13 (×4): qty 1

## 2013-08-13 MED ORDER — SODIUM CHLORIDE 0.9 % IJ SOLN
3.0000 mL | Freq: Two times a day (BID) | INTRAMUSCULAR | Status: DC
  Administered 2013-08-14 – 2013-08-16 (×4): 3 mL via INTRAVENOUS

## 2013-08-13 MED ORDER — ACETAMINOPHEN 325 MG PO TABS: 650.0000 mg | ORAL_TABLET | Freq: Four times a day (QID) | ORAL | Status: DC | PRN

## 2013-08-13 MED ORDER — LIDOCAINE VISCOUS 2 % MT SOLN
OROMUCOSAL | Status: AC
  Filled 2013-08-13: qty 15

## 2013-08-13 MED ORDER — CANAGLIFLOZIN 300 MG PO TABS: 1.0000 | ORAL_TABLET | Freq: Every day | ORAL | Status: DC

## 2013-08-13 MED ORDER — ONDANSETRON HCL 4 MG PO TABS
4.0000 mg | ORAL_TABLET | Freq: Four times a day (QID) | ORAL | Status: DC | PRN
  Administered 2013-08-15: 4 mg via ORAL
  Filled 2013-08-13: qty 1

## 2013-08-13 MED ORDER — SODIUM CHLORIDE 0.9 % IV SOLN
1000.0000 mL | INTRAVENOUS | Status: DC
  Administered 2013-08-13: 1000 mL via INTRAVENOUS
  Administered 2013-08-13: 17:00:00 via INTRAVENOUS

## 2013-08-13 MED ORDER — HYDROMORPHONE HCL PF 1 MG/ML IJ SOLN: 1.0000 mg | INTRAMUSCULAR | Status: DC | PRN

## 2013-08-13 MED ORDER — HYDROCODONE-ACETAMINOPHEN 5-325 MG PO TABS
1.0000 | ORAL_TABLET | ORAL | Status: DC | PRN
  Administered 2013-08-14: 2 via ORAL
  Filled 2013-08-13: qty 2

## 2013-08-13 MED ORDER — DEXTROSE 50 % IV SOLN: 25.0000 mL | INTRAVENOUS | Status: DC | PRN

## 2013-08-13 MED ORDER — ONDANSETRON HCL 4 MG/2ML IJ SOLN
4.0000 mg | Freq: Once | INTRAMUSCULAR | Status: AC
  Administered 2013-08-13: 4 mg via INTRAVENOUS
  Filled 2013-08-13: qty 2

## 2013-08-13 MED ORDER — DEXTROSE-NACL 5-0.45 % IV SOLN
INTRAVENOUS | Status: DC
  Administered 2013-08-13: 1000 mL via INTRAVENOUS

## 2013-08-13 MED ORDER — FENTANYL CITRATE 0.05 MG/ML IJ SOLN
INTRAMUSCULAR | Status: AC
  Filled 2013-08-13: qty 2

## 2013-08-13 SURGICAL SUPPLY — 24 items
BAG DRAIN URO TABLE W/ADPT NS (DRAPE) ×2 IMPLANT
BAG DRN 8 ADPR NS SKTRN CSTL (DRAPE) ×1
BAG URINE DRAINAGE (UROLOGICAL SUPPLIES) ×1 IMPLANT
CATH 5 FR WEDGE TIP (UROLOGICAL SUPPLIES) ×2 IMPLANT
CATH FOLEY 2WAY SLVR  5CC 18FR (CATHETERS) ×1
CATH FOLEY 2WAY SLVR 5CC 18FR (CATHETERS) IMPLANT
CATH OPEN TIP 5FR (CATHETERS) ×2 IMPLANT
CLOTH BEACON ORANGE TIMEOUT ST (SAFETY) ×2 IMPLANT
DECANTER SPIKE VIAL GLASS SM (MISCELLANEOUS) ×2 IMPLANT
DILATOR UROMAX ULTRA (MISCELLANEOUS) IMPLANT
ERX 10323 IMPLANT
GLOVE BIO SURGEON STRL SZ7 (GLOVE) ×2 IMPLANT
GOWN STRL REUS W/TWL LRG LVL3 (GOWN DISPOSABLE) ×2 IMPLANT
IV NS IRRIG 3000ML ARTHROMATIC (IV SOLUTION) ×4 IMPLANT
KIT ROOM TURNOVER AP CYSTO (KITS) ×2 IMPLANT
MANIFOLD NEPTUNE II (INSTRUMENTS) ×2 IMPLANT
PACK CYSTO (CUSTOM PROCEDURE TRAY) ×2 IMPLANT
PAD ARMBOARD 7.5X6 YLW CONV (MISCELLANEOUS) ×2 IMPLANT
SET IRRIGATING DISP (SET/KITS/TRAYS/PACK) ×2 IMPLANT
STENT PERCUFLEX 4.8FRX24 (STENTS) ×1 IMPLANT
STONE RETRIEVAL GEMINI 2.4 FR (MISCELLANEOUS) IMPLANT
SYR 5ML LL (SYRINGE) ×1 IMPLANT
TOWEL OR 17X26 4PK STRL BLUE (TOWEL DISPOSABLE) ×2 IMPLANT
WIRE GUIDE BENTSON .035 15CM (WIRE) ×2 IMPLANT

## 2013-08-13 NOTE — ED Provider Notes (Addendum)
CSN: [id number]     Arrival date & time 08/13/13  7628 History  This chart was scribed for Mervin Kung, MD,  by Stacy Gardner, ED Scribe. The patient was seen in room APA10/APA10 and the patient's care was started at 7:18 AM.   First MD Initiated Contact with Patient 08/13/13 [phone]     Chief Complaint  Patient presents with  . Emesis  . Palpitations   (Consider location/radiation/quality/duration/timing/severity/associated sxs/prior Treatment) Patient is a 58 y.o. female presenting with vomiting and palpitations. The history is provided by the patient and medical records. No language interpreter was used.  Emesis Associated symptoms: no abdominal pain, no diarrhea, no headaches, no myalgias and no sore throat   Palpitations Associated symptoms: back pain, dizziness, nausea and vomiting   Associated symptoms: no chest pain and no shortness of breath    HPI Comments: CAITLAIN TWEED is a 58 y.o. female who presents to the Emergency Department complaining of emesis and palpitations for the past three days. She recently visited the ED to be treated for kidney stone three days ago. Pt mentions the heart palpitations feels as though, "My heart races like it is going to beat out of my chest.". Pt has tried eating crackers and drinking water but denies being able to retain anything without vomiting. Her last emesis episode was last night prior to going to sleep. Pt mentions she typically vomits clear fluids with a watery consistency.  She has the associated symptoms of fever (98.1)  and chills with onset of yesterday. Pt has tried Ibuprofen to no relief. She is also experiencing lower right sided back pain which she treats with Percocet. Pt mentions that her urine is "foamy" in appearance. Her last BM was three days ago. Denies severe back pain. Pt has a referral to see Dr. Viann Shove if symptoms progresses which she denies using. Hx of migraines headaches which she takes Imitrex to relieve pain.  Prior to three days ago pt had flu-like symptoms.  Past Medical History  Diagnosis Date  . Diabetes mellitus   . Hearing difficulty   . Diabetic gastroparesis 07/23/2011    Suspected  . Anemia 07/23/2011  . Microcytic anemia 07/23/2011   Past Surgical History  Procedure Laterality Date  . Wrist surgery Right    History reviewed. No pertinent family history. History  Substance Use Topics  . Smoking status: Never Smoker   . Smokeless tobacco: Not on file  . Alcohol Use: No   OB History   Grav Para Term Preterm Abortions TAB SAB Ect Mult Living                 Review of Systems  Constitutional: Positive for fever.  HENT: Negative for congestion, rhinorrhea and sore throat.   Eyes: Negative for visual disturbance.  Respiratory: Negative for shortness of breath.   Cardiovascular: Positive for palpitations. Negative for chest pain and leg swelling.  Gastrointestinal: Positive for nausea and vomiting. Negative for abdominal pain, diarrhea and constipation.  Genitourinary: Negative for dysuria.  Musculoskeletal: Positive for back pain. Negative for myalgias.  Skin: Negative for rash.  Neurological: Positive for dizziness. Negative for headaches.  Hematological: Does not bruise/bleed easily.  All other systems reviewed and are negative.    Allergies  Lisinopril  Home Medications   Current Outpatient Rx  Name  Route  Sig  Dispense  Refill  . Canagliflozin (INVOKANA) 300 MG TABS   Oral   Take 1 tablet by mouth daily.         Marland Kitchen  ciprofloxacin (CIPRO) 500 MG tablet   Oral   Take 1 tablet (500 mg total) by mouth every 12 (twelve) hours.   10 tablet   0   . insulin aspart (NOVOLOG) 100 UNIT/ML injection   Subcutaneous   Inject 0-17 Units into the skin 3 (three) times daily before meals. SLIDING SCALE: FOR BLOOD SUGAR OF 121-150 TAKE 2 UNITS; FOR  BLOOD SUGAR OF 151-200 TAKE 4 UNITS; FOR BLOOD SUGAR OF 201-250 TAKE 7 UNITS; FOR BLOOD SUGAR OF 251-300 TAKE 9 UNITS; FOR BLOOD  SUGAR OF 301-350 TAKE 12 UNITS; FOR BLOOD SUGAR OF 351-400 UNITS, TAKE 15 UNITS; FOR BLOOD SUGAR OF GREATER THAN 400 TAKE 17 UNITS AND CALL YOUR DOCTOR.   1 vial   6   . insulin glargine (LANTUS) 100 UNIT/ML injection   Subcutaneous   Inject 14 Units into the skin at bedtime.          Marland Kitchen levothyroxine (SYNTHROID, LEVOTHROID) 50 MCG tablet   Oral   Take 50 mcg by mouth daily.           Marland Kitchen losartan (COZAAR) 25 MG tablet   Oral   Take 25 mg by mouth daily.           . metFORMIN (GLUCOPHAGE) 1000 MG tablet   Oral   Take 1,000 mg by mouth 2 (two) times daily with a meal.         . ondansetron (ZOFRAN) 4 MG tablet   Oral   Take 1 tablet (4 mg total) by mouth every 6 (six) hours.   12 tablet   0   . oxyCODONE-acetaminophen (PERCOCET/ROXICET) 5-325 MG per tablet   Oral   Take 1-2 tablets by mouth every 4 (four) hours as needed for severe pain.   6 tablet   0   . pravastatin (PRAVACHOL) 40 MG tablet   Oral   Take 40 mg by mouth daily.           . SUMAtriptan (IMITREX) 100 MG tablet   Oral   Take 100 mg by mouth every 2 (two) hours as needed. For migraines          BP 109/44  Pulse 95  Temp(Src) 98 F (36.7 C) (Oral)  Resp 20  Ht 5\' 2"  (1.575 m)  Wt 155 lb (70.308 kg)  BMI 28.34 kg/m2  SpO2 100% Physical Exam  Constitutional: She appears well-developed and well-nourished. No distress.  HENT:  Head: Normocephalic.  Mouth/Throat: Mucous membranes are dry.  Cardiovascular: Normal rate and normal heart sounds.  Exam reveals no gallop.   No murmur heard. Pulmonary/Chest: Breath sounds normal. She has no wheezes. She has no rales.  Abdominal: Bowel sounds are normal. There is no tenderness.  Musculoskeletal: Normal range of motion. She exhibits no edema.    ED Course  Procedures (including critical care time) DIAGNOSTIC STUDIES: Oxygen Saturation is 100% on room air, normal  by my interpretation.    COORDINATION OF CARE:  7:25 AM Discussed course of care  with pt which includes Zofran 4mg , laboratory tests, abdomen CT, and EKG. Pt understands and agrees.    Labs Review Labs Reviewed  CBC WITH DIFFERENTIAL - Abnormal; Notable for the following:    WBC 25.8 (*)    RBC 5.52 (*)    MCV 65.9 (*)    MCH 21.9 (*)    RDW 15.7 (*)    Neutrophils Relative % 88 (*)    Neutro Abs 22.6 (*)    Lymphocytes  Relative 6 (*)    Monocytes Absolute 1.7 (*)    All other components within normal limits  BASIC METABOLIC PANEL - Abnormal; Notable for the following:    Sodium 133 (*)    Chloride 90 (*)    CO2 11 (*)    Glucose, Bld 255 (*)    BUN 34 (*)    Creatinine, Ser 1.17 (*)    GFR calc non Af Amer 51 (*)    GFR calc Af Amer 59 (*)    All other components within normal limits  URINALYSIS, ROUTINE W REFLEX MICROSCOPIC - Abnormal; Notable for the following:    Glucose, UA 500 (*)    Hgb urine dipstick MODERATE (*)    Bilirubin Urine SMALL (*)    Ketones, ur >80 (*)    Protein, ur TRACE (*)    All other components within normal limits  LIPASE, BLOOD - Abnormal; Notable for the following:    Lipase 10 (*)    All other components within normal limits  URINE CULTURE  CULTURE, BLOOD (ROUTINE X 2)  CULTURE, BLOOD (ROUTINE X 2)  URINE MICROSCOPIC-ADD ON  LACTIC ACID, PLASMA   Results for orders placed during the hospital encounter of 08/13/13  CBC WITH DIFFERENTIAL      Result Value Range   WBC 25.8 (*) 4.0 - 10.5 K/uL   RBC 5.52 (*) 3.87 - 5.11 MIL/uL   Hemoglobin 12.1  12.0 - 15.0 g/dL   HCT 36.4  36.0 - 46.0 %   MCV 65.9 (*) 78.0 - 100.0 fL   MCH 21.9 (*) 26.0 - 34.0 pg   MCHC 33.2  30.0 - 36.0 g/dL   RDW 15.7 (*) 11.5 - 15.5 %   Platelets 252  150 - 400 K/uL   Neutrophils Relative % 88 (*) 43 - 77 %   Neutro Abs 22.6 (*) 1.7 - 7.7 K/uL   Lymphocytes Relative 6 (*) 12 - 46 %   Lymphs Abs 1.5  0.7 - 4.0 K/uL   Monocytes Relative 7  3 - 12 %   Monocytes Absolute 1.7 (*) 0.1 - 1.0 K/uL   Eosinophils Relative 0  0 - 5 %   Eosinophils  Absolute 0.0  0.0 - 0.7 K/uL   Basophils Relative 0  0 - 1 %   Basophils Absolute 0.0  0.0 - 0.1 K/uL  BASIC METABOLIC PANEL      Result Value Range   Sodium 133 (*) 137 - 147 mEq/L   Potassium 4.3  3.7 - 5.3 mEq/L   Chloride 90 (*) 96 - 112 mEq/L   CO2 11 (*) 19 - 32 mEq/L   Glucose, Bld 255 (*) 70 - 99 mg/dL   BUN 34 (*) 6 - 23 mg/dL   Creatinine, Ser 1.17 (*) 0.50 - 1.10 mg/dL   Calcium 9.8  8.4 - 10.5 mg/dL   GFR calc non Af Amer 51 (*) >90 mL/min   GFR calc Af Amer 59 (*) >90 mL/min  URINALYSIS, ROUTINE W REFLEX MICROSCOPIC      Result Value Range   Color, Urine YELLOW  YELLOW   APPearance CLEAR  CLEAR   Specific Gravity, Urine 1.025  1.005 - 1.030   pH 5.5  5.0 - 8.0   Glucose, UA 500 (*) NEGATIVE mg/dL   Hgb urine dipstick MODERATE (*) NEGATIVE   Bilirubin Urine SMALL (*) NEGATIVE   Ketones, ur >80 (*) NEGATIVE mg/dL   Protein, ur TRACE (*) NEGATIVE mg/dL   Urobilinogen,  UA 0.2  0.0 - 1.0 mg/dL   Nitrite NEGATIVE  NEGATIVE   Leukocytes, UA NEGATIVE  NEGATIVE  URINE MICROSCOPIC-ADD ON      Result Value Range   Squamous Epithelial / LPF RARE  RARE   WBC, UA TOO NUMEROUS TO COUNT  <3 WBC/hpf   RBC / HPF TOO NUMEROUS TO COUNT  <3 RBC/hpf   Bacteria, UA RARE  RARE  LIPASE, BLOOD      Result Value Range   Lipase 10 (*) 11 - 59 U/L    Imaging Review Ct Abdomen Pelvis Wo Contrast  08/13/2013   CLINICAL DATA:  Known kidney stones  EXAM: CT ABDOMEN AND PELVIS WITHOUT CONTRAST  TECHNIQUE: Multidetector CT imaging of the abdomen and pelvis was performed following the standard protocol without intravenous contrast.  COMPARISON:  CT ABD/PELV WO CM dated 08/10/2013  FINDINGS: Renal: There is renal edema of the right kidney and mild hydronephrosis. This secondary to obstructing calculus measuring 9 mm at the right ureteral pelvic junction (image 40, series 2). No additional nephrolithiasis evident within the left right kidney. There is no more distal ureteral calculi or bladder  calculi. The UPJ calculus is evident on the CT scout  Lung bases are clear. No focal hepatic lesion is noncontrast exam. The gallbladder, pancreas, spleen, and adrenal glands are normal.  The stomach, small bowel, cecum are normal. The colon and rectosigmoid colon are normal.  No free fluid the pelvis. The uterus and bladder normal. No pelvic lymphadenopathy.  IMPRESSION: Obstructing calculus at the right ureteropelvic junction with hydronephrosis and renal edema on the right. This calculus is evident on the CT scout.   Electronically Signed   By: Suzy Bouchard M.D.   On: 08/13/2013 08:34    EKG Interpretation    Date/Time:  Thursday August 13 2013 08:26:43 EST Ventricular Rate:  90 PR Interval:  140 QRS Duration: 90 QT Interval:  378 QTC Calculation: 462 R Axis:   98 Text Interpretation:  Normal sinus rhythm Rightward axis Nonspecific T wave abnormality Prolonged QT Abnormal ECG When compared with ECG of 22-Jul-2011 10:18, T wave inversion less evident in Inferior leads Nonspecific T wave abnormality now evident in Anterior leads Confirmed by Kaulana Brindle  MD, Montavis Schubring ([id number]) on 08/13/2013 8:41:37 AM           CRITICAL CARE Performed by: Mervin Kung. Total critical care time: 30 Critical care time was exclusive of separately billable procedures and treating other patients. Critical care was necessary to treat or prevent imminent or life-threatening deterioration. Critical care was time spent personally by me on the following activities: development of treatment plan with patient and/or surrogate as well as nursing, discussions with consultants, evaluation of patient's response to treatment, examination of patient, obtaining history from patient or surrogate, ordering and performing treatments and interventions, ordering and review of laboratory studies, ordering and review of radiographic studies, pulse oximetry and re-evaluation of patient's condition.   MDM   1. Right ureteral  stone   2. Pyelonephritis    Persistent renal calculus on the right side but it is close to the bladder junction. A shoe with marked did evidence of urinary tract infection or [place]. Despite being on Cipro which was sensitive to the urine culture from 3 days ago. Patient also has a metabolic acidosis. Will check lactic acid this also could be due to blood sugars being high could be a ketoacidosis. Patient will require admission for     I personally performed the services described  in this documentation, which was scribed in my presence. The recorded information has been reviewed and is accurate.       Mervin Kung, MD 08/13/13 [phone]   Discussed with urology Dr. Geraldo Pitter and the hospitalist admitting team. They are putting in step down orders Dr. Geraldo Pitter will see the patient. Patient will be started on nonglucose stabilizer protocol.  Mervin Kung, MD 08/13/13 [phone]

## 2013-08-13 NOTE — H&P (Signed)
[facility] History and Physical  Melissa Schneider UVO:[id number] DOB: November 11, 1955 DOA: 08/13/2013  Referring physician: ER physician PCP: Horatio Pel, MD   Chief Complaint: nausea, vomiting, back pain, fever  HPI:  58 year old female with past medical history of diabetes, hypertension, hypothyroidism, dyslipidemia who presented to [facility] ED 08/13/2013 with complaints of ongoing non bloody nausea, vomiting, back pain especially on the right side and associated fevers. She reported symptoms started about few days prior to this admission. She reported pain to be 8/10 in intensity, at rest and with moving, not relieved with ibuprofen or percocet at home. She denies having blood in urine but she did endorse cloudy urine. No reports of chest pain but did feel palpitations. No shortness of breath. No loss of consciousness. In ED, BP was 109/44, HR 95, Tmax 98.9 F and oxygen saturation 100% on room air. CT abd revealed obstructing calculus at the right ureteropelvic junction with hydronephrosis and renal edema on the right. WBC count was 25.8. In addition, she was found to be in DKA with AG of 32 and her creatinine was elevated at 1.17. Urology consulted for hydronephrosis. Started on insulin drip for DKA.  Assessment and Plan:  Principal Problem:   DKA (diabetic ketoacidoses) - admitted to SDU - DKA protocol ordered - insulin drip, IV fluids bolus and maintenance; CBG check Q 1 hour, BMP Q 2 hour check until AG closes of bicarb normalizes whichever comes first - hold metformin - continue antiemetics PRN - keep NPO for now Active Problems:   UTI (urinary tract infection) - started rocephin 1 gm Q 24 hours IV - follow up urine culture results    Obstructive uropathy with right side hydronephrosis - appreciate urology consult and recommendations    HYPOTHYROIDISM - check TSH - continue synthroid   HYPERCHOLESTEROLEMIA - continue statin therapy   Leukocytosis, unspecified -  secondary to UTI   Acute renal failure - secondary to obstructive uropathy, hydronephrosis - creatinine normalized with IV fluids  Radiological Exams on Admission: Ct Abdomen Pelvis Wo Contrast 08/13/2013    IMPRESSION: Obstructing calculus at the right ureteropelvic junction with hydronephrosis and renal edema on the right. This calculus is evident on the CT scout.      Code Status: Full Family Communication: Pt at bedside Disposition Plan: Admit for further evaluation  Leisa Lenz, MD  [facility] Pager [phone]  Review of Systems:  Constitutional: positive for fever, chills and malaise/fatigue. Negative for diaphoresis.  HENT: Negative for hearing loss, ear pain, nosebleeds, congestion, sore throat, neck pain, tinnitus and ear discharge.   Eyes: Negative for blurred vision, double vision, photophobia, pain, discharge and redness.  Respiratory: Negative for cough, hemoptysis, sputum production, shortness of breath, wheezing and stridor.   Cardiovascular: Negative for chest pain, palpitations, orthopnea, claudication and leg swelling.  Gastrointestinal: positive for nausea, vomiting and abdominal pain. Negative for heartburn, constipation, blood in stool and melena.  Genitourinary: Negative for dysuria, urgency, frequency, hematuria and flank pain.  Musculoskeletal: Negative for myalgias, positive for back pain, no joint pain and falls.  Skin: Negative for itching and rash.  Neurological: Negative for dizziness and weakness. Negative for tingling, tremors, sensory change, speech change, focal weakness, loss of consciousness and headaches.  Endo/Heme/Allergies: Negative for environmental allergies and polydipsia. Does not bruise/bleed easily.  Psychiatric/Behavioral: Negative for suicidal ideas. The patient is not nervous/anxious.      Past Medical History  Diagnosis Date  . Diabetes mellitus   . Hearing difficulty   .  Diabetic gastroparesis 07/23/2011    Suspected  .  Anemia 07/23/2011  . Microcytic anemia 07/23/2011  . RUEAVWUJ(811.9)    Past Surgical History  Procedure Laterality Date  . Wrist surgery Right    Social History:  reports that she has never smoked. She does not have any smokeless tobacco history on file. She reports that she does not drink alcohol or use illicit drugs.  Allergies  Allergen Reactions  . Lisinopril     REACTION: coughing    Family History: Family medical history significant for HTN, HLD   Prior to Admission medications   Medication Sig Start Date End Date Taking? Authorizing Provider  Canagliflozin (INVOKANA) 300 MG TABS Take 1 tablet by mouth daily.   Yes Historical Provider, MD  ciprofloxacin (CIPRO) 500 MG tablet Take 1 tablet (500 mg total) by mouth every 12 (twelve) hours. 08/10/13  Yes Virgel Manifold, MD  insulin aspart (NOVOLOG) 100 UNIT/ML injection Inject 0-17 Units  07/24/11  Yes Rexene Alberts, MD  insulin glargine (LANTUS) 100 UNIT/ML injection Inject 14 Units into the skin at bedtime.    Yes Historical Provider, MD  levothyroxine (SYNTHROID, LEVOTHROID) 50 MCG tablet Take 50 mcg by mouth daily.     Yes Historical Provider, MD  losartan (COZAAR) 25 MG tablet Take 25 mg by mouth daily.     Yes Historical Provider, MD  metFORMIN (GLUCOPHAGE) 1000 MG tablet Take 1,000 mg by mouth 2 (two) times daily with a meal.   Yes Historical Provider, MD  ondansetron (ZOFRAN) 4 MG tablet Take 1 tablet (4 mg total) by mouth every 6 (six) hours. 08/10/13  Yes Virgel Manifold, MD  oxyCODONE-acetaminophen (PERCOCET/ROXICET) 5-325 MG per tablet Take 1-2 tablets by mouth every 4 (four) hours as needed for severe pain. 08/10/13  Yes Virgel Manifold, MD  pravastatin (PRAVACHOL) 40 MG tablet Take 40 mg by mouth daily.     Yes Historical Provider, MD  SUMAtriptan (IMITREX) 100 MG tablet Take 100 mg by mouth every 2 (two) hours as needed. For migraines   Yes Historical Provider, MD   Physical Exam: Filed Vitals:   08/13/13 1028 08/13/13 1137  08/13/13 1200 08/13/13 1405  BP: 108/51 118/46  109/60  Pulse: 89 85 87 81  Temp:   98.9 F (37.2 C)   TempSrc:   Oral   Resp: 18 20 23 18   Height:    5\' 2"  (1.575 m)  Weight:    70.5 kg (155 lb 6.8 oz)  SpO2: 100% 100% 98% 100%    Physical Exam  Constitutional: Appears ill, no acute distress  HENT: Normocephalic. External right and left ear normal. Dry mucus membranes  Eyes: Conjunctivae and EOM are normal. PERRLA, no scleral icterus.  Neck: Normal ROM. Neck supple. No JVD. No tracheal deviation. No thyromegaly.  CVS: RRR, S1/S2 +, no murmurs, no gallops, no carotid bruit.  Pulmonary: Effort and breath sounds normal, no stridor, rhonchi, wheezes, rales.  Abdominal: Soft. BS +,  no distension, tenderness on right side back/ abd, no rebound or guarding.  Musculoskeletal: Normal range of motion. No edema and no tenderness.  Lymphadenopathy: No lymphadenopathy noted, cervical, inguinal. Neuro: Alert. Normal reflexes, muscle tone coordination. No cranial nerve deficit. Skin: Skin is warm and dry. No rash noted. Psychiatric: Normal mood and affect. Behavior, judgment, thought content normal.   Labs on Admission:  Basic Metabolic Panel:  Recent Labs Lab 08/10/13 1503 08/13/13 0651 08/13/13 0914 08/13/13 1125  NA 138 133* 135* 138  K 4.1 4.3 4.1  4.1  CL 99 90* 96 102  CO2 18* 11* 11* 13*  GLUCOSE 266* 255* 217* 140*  BUN 21 34* 31* 29*  CREATININE 0.79 1.17* 1.01 0.95  CALCIUM 9.1 9.8 8.8 8.6   Liver Function Tests:  Recent Labs Lab 08/10/13 1503  AST 17  ALT 12  ALKPHOS 119*  BILITOT 0.3  PROT 7.8  ALBUMIN 3.8    Recent Labs Lab 08/10/13 1503 08/13/13 0651  LIPASE 32 10*   No results found for this basename: AMMONIA,  in the last 168 hours CBC:  Recent Labs Lab 08/10/13 1503 08/13/13 0651  WBC 19.0* 25.8*  NEUTROABS 17.4* 22.6*  HGB 12.1 12.1  HCT 37.5 36.4  MCV 65.6* 65.9*  PLT 321 252   Cardiac Enzymes: No results found for this basename:  CKTOTAL, CKMB, CKMBINDEX, TROPONINI,  in the last 168 hours BNP: No components found with this basename: POCBNP,  CBG:  Recent Labs Lab 08/13/13 1010 08/13/13 1139 08/13/13 1254  GLUCAP 163* 138* 114*    If 7PM-7AM, please contact night-coverage [email or web address] Password [email or web address] 08/13/2013, 2:28 PM

## 2013-08-13 NOTE — Anesthesia Postprocedure Evaluation (Signed)
  Anesthesia Post-op Note  Patient: Melissa Schneider  Procedure(s) Performed: Procedure(s): CYSTOSCOPY WITH RETROGRADE PYELOGRAM/URETERAL STENT PLACEMENT (Right)  Patient Location: PACU  Anesthesia Type:MAC  Level of Consciousness: awake, alert , oriented and patient cooperative  Airway and Oxygen Therapy: Patient Spontanous Breathing  Post-op Pain: none  Post-op Assessment: Post-op Vital signs reviewed, Patient's Cardiovascular Status Stable, Respiratory Function Stable, Patent Airway, No signs of Nausea or vomiting and Pain level controlled  Post-op Vital Signs: Reviewed and stable  Complications: No apparent anesthesia complications

## 2013-08-13 NOTE — Consult Note (Signed)
Consult note [phone]

## 2013-08-13 NOTE — ED Notes (Signed)
Patient recently seen in ED and diagnosed with kidney stone. Complains of fever of 99.1 this morning and nausea and vomiting for 3 days. Reports feels as if heart is racing when she stands up.

## 2013-08-13 NOTE — Transfer of Care (Signed)
Immediate Anesthesia Transfer of Care Note  Patient: Melissa Schneider  Procedure(s) Performed: Procedure(s): CYSTOSCOPY WITH RETROGRADE PYELOGRAM/URETERAL STENT PLACEMENT (Right)  Patient Location: PACU  Anesthesia Type:MAC  Level of Consciousness: awake and patient cooperative  Airway & Oxygen Therapy: Patient Spontanous Breathing and Patient connected to face mask oxygen  Post-op Assessment: Report given to PACU RN, Post -op Vital signs reviewed and stable and Patient moving all extremities  Post vital signs: Reviewed and stable  Complications: No apparent anesthesia complications

## 2013-08-13 NOTE — Brief Op Note (Signed)
08/13/2013  5:25 PM  PATIENT:  Melissa Schneider  58 y.o. female  PRE-OPERATIVE DIAGNOSIS:  Renal calculi right, Diabetic Ketoacidosis  POST-OPERATIVE DIAGNOSIS:  Renal calculi right, Diabetic Ketoacidosis  PROCEDURE:  Procedure(s): CYSTOSCOPY WITH RETROGRADE PYELOGRAM/URETERAL STENT PLACEMENT (Right)  SURGEON:  Surgeon(s) and Role:    * Marissa Nestle, MD - Primary  PHYSICIAN ASSISTANT:   ASSISTANTS: none   ANESTHESIA:   MAC  EBL:  Total I/O In: 1000 [I.V.:1000] Out: 650 [Urine:650]  BLOOD ADMINISTERED:none  DRAINS:    LOCAL MEDICATIONS USED:  NONE  SPECIMEN:  No Specimen  DISPOSITION OF SPECIMEN:  N/A  COUNTS:  YES  TOURNIQUET:  * No tourniquets in log *  DICTATION: .Other Dictation: Dictation Number dictation [id number]  PLAN OF CARE: Admit for overnight observation  PATIENT DISPOSITION:  PACU - hemodynamically stable.   Delay start of Pharmacological VTE agent (>24hrs) due to surgical blood loss or risk of bleeding:

## 2013-08-13 NOTE — Anesthesia Preprocedure Evaluation (Addendum)
Anesthesia Evaluation  Patient identified by MRN, date of birth, ID band Patient awake    Reviewed: Allergy & Precautions, H&P , NPO status , Patient's Chart, lab work & pertinent test results, reviewed documented beta blocker date and time   Airway Mallampati: I TM Distance: >3 FB Neck ROM: Full    Dental  (+) Teeth Intact   Pulmonary Recent URI , Resolved,  breath sounds clear to auscultation  Pulmonary exam normal       Cardiovascular Exercise Tolerance: Good Rhythm:Regular Rate:Normal     Neuro/Psych  Headaches,    GI/Hepatic   Endo/Other  diabetes, Well Controlled, Type 1, Insulin Dependent and Oral Hypoglycemic AgentsHypothyroidism   Renal/GU Renal disease     Musculoskeletal   Abdominal (+) + obese,  Abdomen: soft. Bowel sounds: normal.  Peds  Hematology   Anesthesia Other Findings   Reproductive/Obstetrics                          Anesthesia Physical Anesthesia Plan  ASA: III and emergent  Anesthesia Plan: MAC   Post-op Pain Management:    Induction: Intravenous  Airway Management Planned: Simple Face Mask  Additional Equipment:   Intra-op Plan:   Post-operative Plan:   Informed Consent:   Plan Discussed with: Anesthesiologist and Surgeon  Anesthesia Plan Comments:         Anesthesia Quick Evaluation

## 2013-08-13 NOTE — Progress Notes (Signed)
Nutrition Brief Note  Received MD consult for diabetes education. Pt is currently in surgery at time of visit and inappropriate for education at this time. Will follow-up to complete education at a later date.   Tiffanny Lamarche A. Jimmye Norman, RD, LDN Pager: [phone]

## 2013-08-13 NOTE — ED Notes (Signed)
Jolene Provost RN made aware of patient's blood glucose and insulin drip stopped.

## 2013-08-14 DIAGNOSIS — N12 Tubulo-interstitial nephritis, not specified as acute or chronic: Secondary | ICD-10-CM

## 2013-08-14 HISTORY — DX: Tubulo-interstitial nephritis, not specified as acute or chronic: N12

## 2013-08-14 LAB — GLUCOSE, CAPILLARY
GLUCOSE-CAPILLARY: 150 mg/dL — AB (ref 70–99)
GLUCOSE-CAPILLARY: 161 mg/dL — AB (ref 70–99)
GLUCOSE-CAPILLARY: 187 mg/dL — AB (ref 70–99)
GLUCOSE-CAPILLARY: 158 mg/dL — AB (ref 70–99)
GLUCOSE-CAPILLARY: 161 mg/dL — AB (ref 70–99)
GLUCOSE-CAPILLARY: 131 mg/dL — AB (ref 70–99)
Glucose-Capillary: 113 mg/dL — ABNORMAL HIGH (ref 70–99)
Glucose-Capillary: 122 mg/dL — ABNORMAL HIGH (ref 70–99)
Glucose-Capillary: 124 mg/dL — ABNORMAL HIGH (ref 70–99)
Glucose-Capillary: 127 mg/dL — ABNORMAL HIGH (ref 70–99)
Glucose-Capillary: 148 mg/dL — ABNORMAL HIGH (ref 70–99)
Glucose-Capillary: 158 mg/dL — ABNORMAL HIGH (ref 70–99)
Glucose-Capillary: 166 mg/dL — ABNORMAL HIGH (ref 70–99)
Glucose-Capillary: 174 mg/dL — ABNORMAL HIGH (ref 70–99)
Glucose-Capillary: 174 mg/dL — ABNORMAL HIGH (ref 70–99)
Glucose-Capillary: 184 mg/dL — ABNORMAL HIGH (ref 70–99)
Glucose-Capillary: 188 mg/dL — ABNORMAL HIGH (ref 70–99)
Glucose-Capillary: 208 mg/dL — ABNORMAL HIGH (ref 70–99)
Glucose-Capillary: 208 mg/dL — ABNORMAL HIGH (ref 70–99)

## 2013-08-14 LAB — URINE CULTURE
CULTURE: NO GROWTH
Colony Count: NO GROWTH
Colony Count: NO GROWTH
Colony Count: NO GROWTH
Culture: NO GROWTH
Culture: NO GROWTH

## 2013-08-14 LAB — BASIC METABOLIC PANEL
BUN: 22 mg/dL (ref 6–23)
BUN: 21 mg/dL (ref 6–23)
BUN: 20 mg/dL (ref 6–23)
BUN: 16 mg/dL (ref 6–23)
CALCIUM: 8.7 mg/dL (ref 8.4–10.5)
CALCIUM: 8.8 mg/dL (ref 8.4–10.5)
CHLORIDE: 100 meq/L (ref 96–112)
CHLORIDE: 103 meq/L (ref 96–112)
CO2: 11 meq/L — AB (ref 19–32)
CO2: 14 meq/L — AB (ref 19–32)
CO2: 18 meq/L — AB (ref 19–32)
CO2: 20 meq/L (ref 19–32)
CREATININE: 0.83 mg/dL (ref 0.50–1.10)
CREATININE: 0.9 mg/dL (ref 0.50–1.10)
CREATININE: 0.86 mg/dL (ref 0.50–1.10)
Calcium: 8.7 mg/dL (ref 8.4–10.5)
Calcium: 8.6 mg/dL (ref 8.4–10.5)
Chloride: 102 mEq/L (ref 96–112)
Chloride: 104 mEq/L (ref 96–112)
Creatinine, Ser: 1.01 mg/dL (ref 0.50–1.10)
GFR calc Af Amer: 90 mL/min — ABNORMAL LOW (ref >90)
GFR calc Af Amer: 81 mL/min — ABNORMAL LOW (ref >90)
GFR calc Af Amer: 71 mL/min — ABNORMAL LOW (ref >90)
GFR calc Af Amer: 86 mL/min — ABNORMAL LOW (ref >90)
GFR calc non Af Amer: 77 mL/min — ABNORMAL LOW (ref >90)
GFR calc non Af Amer: 70 mL/min — ABNORMAL LOW (ref >90)
GFR calc non Af Amer: 61 mL/min — ABNORMAL LOW (ref >90)
GFR calc non Af Amer: 74 mL/min — ABNORMAL LOW (ref >90)
GLUCOSE: 126 mg/dL — AB (ref 70–99)
Glucose, Bld: 184 mg/dL — ABNORMAL HIGH (ref 70–99)
Glucose, Bld: 188 mg/dL — ABNORMAL HIGH (ref 70–99)
Glucose, Bld: 139 mg/dL — ABNORMAL HIGH (ref 70–99)
Potassium: 3.9 mEq/L (ref 3.7–5.3)
Potassium: 3.7 mEq/L (ref 3.7–5.3)
Potassium: 3.2 mEq/L — ABNORMAL LOW (ref 3.7–5.3)
Potassium: 3.3 mEq/L — ABNORMAL LOW (ref 3.7–5.3)
Sodium: 135 mEq/L — ABNORMAL LOW (ref 137–147)
Sodium: 137 mEq/L (ref 137–147)
Sodium: 139 mEq/L (ref 137–147)
Sodium: 139 mEq/L (ref 137–147)

## 2013-08-14 LAB — CBC
HCT: 32.3 % — ABNORMAL LOW (ref 36.0–46.0)
Hemoglobin: 10.3 g/dL — ABNORMAL LOW (ref 12.0–15.0)
MCH: 21.2 pg — ABNORMAL LOW (ref 26.0–34.0)
MCHC: 31.9 g/dL (ref 30.0–36.0)
MCV: 66.5 fL — ABNORMAL LOW (ref 78.0–100.0)
PLATELETS: 214 10*3/uL (ref 150–400)
RBC: 4.86 MIL/uL (ref 3.87–5.11)
RDW: 15.8 % — AB (ref 11.5–15.5)
WBC: 16.6 10*3/uL — AB (ref 4.0–10.5)

## 2013-08-14 LAB — COMPREHENSIVE METABOLIC PANEL
ALK PHOS: 191 U/L — AB (ref 39–117)
ALT: 12 U/L (ref 0–35)
AST: 17 U/L (ref 0–37)
Albumin: 2.3 g/dL — ABNORMAL LOW (ref 3.5–5.2)
BILIRUBIN TOTAL: 0.2 mg/dL — AB (ref 0.3–1.2)
BUN: 22 mg/dL (ref 6–23)
CO2: 11 meq/L — AB (ref 19–32)
Calcium: 8.5 mg/dL (ref 8.4–10.5)
Chloride: 103 mEq/L (ref 96–112)
Creatinine, Ser: 0.87 mg/dL (ref 0.50–1.10)
GFR calc Af Amer: 85 mL/min — ABNORMAL LOW (ref >90)
GFR, EST NON AFRICAN AMERICAN: 73 mL/min — AB (ref >90)
GLUCOSE: 156 mg/dL — AB (ref 70–99)
POTASSIUM: 3.9 meq/L (ref 3.7–5.3)
SODIUM: 139 meq/L (ref 137–147)
Total Protein: 6 g/dL (ref 6.0–8.3)

## 2013-08-14 LAB — TSH: TSH: 0.868 u[IU]/mL (ref 0.350–4.500)

## 2013-08-14 MED ORDER — POTASSIUM CHLORIDE 10 MEQ/100ML IV SOLN
10.0000 meq | INTRAVENOUS | Status: AC
  Administered 2013-08-14 – 2013-08-15 (×4): 10 meq via INTRAVENOUS
  Filled 2013-08-14 (×3): qty 100

## 2013-08-14 MED ORDER — DEXTROSE-NACL 5-0.45 % IV SOLN
INTRAVENOUS | Status: DC
  Administered 2013-08-14 (×3): via INTRAVENOUS

## 2013-08-14 NOTE — Progress Notes (Signed)
Afebrile general status good i do not know why she has hiccough her renal functions appear normal.wbc down from 19000 to 16.6 today no fever urine c/s still not back. eletrolytes are normal. Plan d/c foley catheter.

## 2013-08-14 NOTE — Progress Notes (Addendum)
[facility] PROGRESS NOTE  Melissa Schneider JJH:[id number] DOB: 1956/02/07 DOA: 08/13/2013 PCP: Horatio Pel, MD  Brief narrative: 58 year old female with past medical history of diabetes, hypertension, hypothyroidism, dyslipidemia who presented to [facility] ED 08/13/2013 with complaints of ongoing non bloody nausea, vomiting, back pain especially on the right side and associated fevers.  Pt was subsequently found to have obstructing calculus at the right ureteropelvic junction with hydronephrosis and renal edema on the right. WBC count was 25.8. In addition, she was found to be in DKA with AG of 32 and her creatinine was elevated at 1.17. Urology consulted for hydronephrosis. Started on insulin drip for DKA.   Assessment and Plan:   Principal Problem:  DKA (diabetic ketoacidoses)  - admitted to SDU  - admission DKA protocol ordered - insulin drip, IV fluids bolus and maintenance; CBG check Q 1 hour, BMP Q 2 hour check until AG closes - this am, pt still on insulin drip as AG 25 and CO2 11 - holding metformin  - continue antiemetics PRN   Active Problems:  UTI (urinary tract infection)  - secondary to Klebsiella pneumonia - started rocephin 1 gm Q 24 hours IV - follow up urine culture sensitivity report Obstructive uropathy with right side hydronephrosis  - appreciate urology following - stent placed 08/13/2013 HYPOTHYROIDISM  - continue synthroid  HYPERCHOLESTEROLEMIA  - continue statin therapy  Leukocytosis, unspecified  - secondary to UTI  Acute renal failure  - secondary to obstructive uropathy, hydronephrosis  - creatinine normalized with IV fluids   Code Status: Full  Family Communication: no family at the bedside  Disposition Plan: remains inpatient  Radiological Exams on Admission:  Ct Abdomen Pelvis Wo Contrast  08/13/2013 IMPRESSION: Obstructing calculus at the right ureteropelvic junction with hydronephrosis and renal edema on the right. This calculus is  evident on the CT scout.   Leisa Lenz, MD  [facility] Pager [phone]  If 7PM-7AM, please contact night-coverage [email or web address] Password [email or web address] 08/14/2013, 11:41 AM   LOS: 1 day   Consultants:  GU  Procedures:  Right ureteral stent placement 08/13/2013  Antibiotics:  Rocephin 08/13/2013 -->  HPI/Subjective: Feels better, no pain.  Objective: Filed Vitals:   08/14/13 0400 08/14/13 0500 08/14/13 0600 08/14/13 0730  BP: 103/58 98/53 107/46   Pulse: 81 85 81   Temp: 99 F (37.2 C)   98.4 F (36.9 C)  TempSrc: Oral   Oral  Resp: 23 26 22    Height:      Weight:  70.5 kg (155 lb 6.8 oz)    SpO2: 95% 99% 98%     Intake/Output Summary (Last 24 hours) at 08/14/13 1141 Last data filed at 08/14/13 0600  Gross per 24 hour  Intake 1528.28 ml  Output   2650 ml  Net -1121.72 ml    Exam:   General:  Pt is alert, follows commands appropriately, not in acute distress  Cardiovascular: Regular rate and rhythm, S1/S2 appreciated   Respiratory: Clear to auscultation bilaterally, no wheezing, no crackles, no rhonchi  Abdomen: Soft, tender in mid abdomen R>L, non distended, bowel sounds present, no guarding  Extremities: No edema, pulses DP and PT palpable bilaterally  Neuro: Grossly nonfocal  Data Reviewed: Basic Metabolic Panel:  Recent Labs Lab 08/13/13 1125 08/13/13 1359 08/13/13 1610 08/14/13 0148 08/14/13 0420  NA 138 138 138 135* 139  K 4.1 4.1 3.9 3.9 3.9  CL 102 102 101 100 103  CO2 13* 13* 12* 11* 11*  GLUCOSE 140*  160* 196* 126* 156*  BUN 29* 27* 26* 22 22  CREATININE 0.95 0.91 0.90 0.83 0.87  CALCIUM 8.6 8.8 8.9 8.7 8.5  MG  --  1.8  --   --   --   PHOS  --  2.3  --   --   --    Liver Function Tests:  Recent Labs Lab 08/10/13 1503 08/13/13 1359 08/14/13 0420  AST 17 20 17   ALT 12 13 12   ALKPHOS 119* 147* 191*  BILITOT 0.3 0.2* 0.2*  PROT 7.8 6.5 6.0  ALBUMIN 3.8 2.6* 2.3*    Recent Labs Lab 08/10/13 1503 08/13/13 0651   LIPASE 32 10*   No results found for this basename: AMMONIA,  in the last 168 hours CBC:  Recent Labs Lab 08/10/13 1503 08/13/13 0651 08/13/13 1359 08/14/13 0420  WBC 19.0* 25.8* 19.5* 16.6*  NEUTROABS 17.4* 22.6* 16.0*  --   HGB 12.1 12.1 10.4* 10.3*  HCT 37.5 36.4 32.0* 32.3*  MCV 65.6* 65.9* 66.3* 66.5*  PLT 321 252 244 214   Cardiac Enzymes: No results found for this basename: CKTOTAL, CKMB, CKMBINDEX, TROPONINI,  in the last 168 hours BNP: No components found with this basename: POCBNP,  CBG:  Recent Labs Lab 08/14/13 0442 08/14/13 0650 08/14/13 0745 08/14/13 0858 08/14/13 1001  GLUCAP 148* 188* 184* 174* 158*    Recent Results (from the past 240 hour(s))  URINE CULTURE     Status: None   Collection Time    08/10/13  3:25 PM      Result Value Range Status   Specimen Description URINE, CLEAN CATCH   Final   Special Requests NONE   Final   Culture  Setup Time     Final   Value: 08/11/2013 00:24     Performed at [place]     Final   Value: >=100,000 COLONIES/ML     Performed at [facility]   Culture     Final   Value: KLEBSIELLA PNEUMONIAE     Performed at [facility]   Report Status 08/12/2013 FINAL   Final   Organism ID, Bacteria KLEBSIELLA PNEUMONIAE   Final  CULTURE, BLOOD (ROUTINE X 2)     Status: None   Collection Time    08/13/13  9:14 AM      Result Value Range Status   Specimen Description Blood RIGHT ARM   Final   Special Requests BOTTLES DRAWN AEROBIC AND ANAEROBIC 8 CC EACH   Final   Culture NO GROWTH <24 HRS   Final   Report Status PENDING   Incomplete  CULTURE, BLOOD (ROUTINE X 2)     Status: None   Collection Time    08/13/13  9:21 AM      Result Value Range Status   Specimen Description Blood RIGHT ARM   Final   Special Requests BOTTLES DRAWN AEROBIC AND ANAEROBIC 10 CC EACH   Final   Culture NO GROWTH <24 HRS   Final   Report Status PENDING   Incomplete  SURGICAL PCR SCREEN      Status: None   Collection Time    08/13/13  2:00 PM      Result Value Range Status   MRSA, PCR NEGATIVE  NEGATIVE Final   Staphylococcus aureus NEGATIVE  NEGATIVE Final   Comment:            The Xpert SA Assay (FDA     approved for  NASAL specimens     in patients over 25 years of age),     is one component of     a comprehensive surveillance     program.  Test performance has     been validated by [employer] for patients greater     than or equal to 65 year old.     It is not intended     to diagnose infection nor to     guide or monitor treatment.     Studies: Ct Abdomen Pelvis Wo Contrast  08/13/2013   CLINICAL DATA:  Known kidney stones  EXAM: CT ABDOMEN AND PELVIS WITHOUT CONTRAST  TECHNIQUE: Multidetector CT imaging of the abdomen and pelvis was performed following the standard protocol without intravenous contrast.  COMPARISON:  CT ABD/PELV WO CM dated 08/10/2013  FINDINGS: Renal: There is renal edema of the right kidney and mild hydronephrosis. This secondary to obstructing calculus measuring 9 mm at the right ureteral pelvic junction (image 40, series 2). No additional nephrolithiasis evident within the left right kidney. There is no more distal ureteral calculi or bladder calculi. The UPJ calculus is evident on the CT scout  Lung bases are clear. No focal hepatic lesion is noncontrast exam. The gallbladder, pancreas, spleen, and adrenal glands are normal.  The stomach, small bowel, cecum are normal. The colon and rectosigmoid colon are normal.  No free fluid the pelvis. The uterus and bladder normal. No pelvic lymphadenopathy.  IMPRESSION: Obstructing calculus at the right ureteropelvic junction with hydronephrosis and renal edema on the right. This calculus is evident on the CT scout.   Electronically Signed   By: Suzy Bouchard M.D.   On: 08/13/2013 08:34   Dg C-arm 1-60 Min  08/13/2013   CLINICAL DATA:  Cystoscopy with right-sided stent placement  EXAM: DG C-ARM 1-60 MIN   COMPARISON:  CT abdomen pelvis -08/13/2013  FLUOROSCOPY TIME:  2 min, 18 seconds  FINDINGS: Seven spot intraoperative fluoroscopic images during right-sided ureteral stent placement are provided for review.  Initial image demonstrates a catheter overlying the expected location of the superior aspect of the right ureter and right renal pelvis. There is an ill-defined opacity overlying the suspected location of superior aspect of the right ureter which may represent the partially obstructing ureteral stone demonstrated on previously performed abdominal CT.  Subsequent images demonstrate placement of a right-sided double-J ureteral stent with superior coil overlying the expected location of the renal pelvis and inferior coil overlying the expected location of the urinary bladder, though note, contrast was not injected during this examination.  There is a linear structure which overlies the left hemipelvis which may be external to the patient.  IMPRESSION: 1. Interval placement of right-sided double-J ureteral stent. 2. Ill-defined opacity overlying the expected location the superior aspect of the right ureter may represent the partially obstructing stone demonstrated on prior abdominal CT. 3. Linear structure overlying the left hemipelvis may be external to the patient. Clinical correlation is advised.   Electronically Signed   By: Sandi Mariscal M.D.   On: 08/13/2013 18:52    Scheduled Meds: . Canagliflozin  300 mg Oral Daily  . cefTRIAXone (ROCEPHIN)  IV  1 g Intravenous Q24H  . levothyroxine  50 mcg Oral QAC breakfast  . losartan  25 mg Oral Daily  . simvastatin  20 mg Oral q1800  . sodium chloride  3 mL Intravenous Q12H   Continuous Infusions: . dextrose 5 % and 0.45% NaCl  125 mL/hr at 08/14/13 1130  . insulin (NOVOLIN-R) infusion Stopped (08/13/13 2100)

## 2013-08-14 NOTE — Anesthesia Postprocedure Evaluation (Signed)
  Anesthesia Post-op Note  Patient: Melissa Schneider  Procedure(s) Performed: Procedure(s): CYSTOSCOPY WITH RETROGRADE PYELOGRAM/URETERAL STENT PLACEMENT (Right)  Patient Location: PACU  Anesthesia Type:MAC  Level of Consciousness: awake, alert  and oriented  Airway and Oxygen Therapy: Patient Spontanous Breathing  Post-op Pain: none  Post-op Assessment: Post-op Vital signs reviewed, Patient's Cardiovascular Status Stable, Respiratory Function Stable, Patent Airway and No signs of Nausea or vomiting  Post-op Vital Signs: Reviewed and stable  Complications: No apparent anesthesia complications

## 2013-08-14 NOTE — Evaluation (Signed)
Physical Therapy Evaluation Patient Details Name: ERABELLA KUIPERS MRN: [id number] DOB: 08-04-1955 Today's Date: 08/14/2013 Time: 2595-6387 PT Time Calculation (min): 30 min  PT Assessment / Plan / Recommendation History of Present Illness  58 year old female with past medical history of diabetes, hypertension, hypothyroidism, dyslipidemia who presented to [facility] 08/13/2013 with complaints of ongoing non bloody nausea, vomiting, back pain especially on the right side and associated fevers. She reported symptoms started about few days prior to this admission  Clinical Impression  Pt is I or Mod I in all activities.  Pt is slow but does not need skilled PT    PT Assessment  Patent does not need any further PT services    Follow Up Recommendations  No PT follow up    Does the patient have the potential to tolerate intense rehabilitation    N/A  Barriers to Discharge  none      Equipment Recommendations    none   Recommendations for Other Services   none  Frequency   n/a   Precautions / Restrictions Precautions Precautions: None Restrictions Weight Bearing Restrictions: No   Pertinent Vitals/Pain None noted      Mobility  Bed Mobility Overal bed mobility: Independent Transfers Overall transfer level: Independent Ambulation/Gait Ambulation/Gait assistance: Modified independent (Device/Increase time) Ambulation Distance (Feet): 300 Feet Assistive device: None Gait Pattern/deviations: Decreased step length - right;Decreased step length - left Gait velocity: gt is slow but pt has IV as well as catheter       PT Goals(Current goals can be found in the care plan section) Acute Rehab PT Goals PT Goal Formulation: With patient  Visit Information  Last PT Received On: 08/14/13 History of Present Illness: 58 year old female with past medical history of diabetes, hypertension, hypothyroidism, dyslipidemia who presented to [facility] 08/13/2013 with complaints of ongoing non  bloody nausea, vomiting, back pain especially on the right side and associated fevers. She reported symptoms started about few days prior to this admission       Prior [place] expects to be discharged to:: Private residence Living Arrangements: Spouse/significant other Available Help at Discharge: Family Type of Home: House Home Access: Stairs to enter Technical brewer of Steps: 3 Entrance Stairs-Rails: Right Home Layout: Able to live on main level with bedroom/bathroom Home Equipment: None Prior Function Level of Independence: Independent Communication Communication: No difficulties    Cognition  Cognition Arousal/Alertness: Awake/alert Behavior During Therapy: WFL for tasks assessed/performed Overall Cognitive Status: Within Functional Limits for tasks assessed    Extremity/Trunk Assessment Lower Extremity Assessment Lower Extremity Assessment: Overall WFL for tasks assessed   Balance    End of Session PT - End of Session Equipment Utilized During Treatment: Gait belt Patient left: in chair;with call bell/phone within reach;with family/visitor present  GP     RUSSELL,[name] 08/14/2013, 1:45 PM

## 2013-08-14 NOTE — Consult Note (Signed)
NAME:  Melissa Schneider, Melissa Schneider             ACCOUNT NO.:  [id number]  MEDICAL RECORD NO.:  [id number]  LOCATION:  IC02                          FACILITY:  [facility]  PHYSICIAN:  Marissa Nestle, M.D.DATE OF BIRTH:  1956-02-03  DATE OF CONSULTATION: DATE OF DISCHARGE:                                CONSULTATION   CHIEF COMPLAINT:  Right flank pain.  HISTORY:  A 58 year old female who came to the emergency room second time, she was here 3 days ago.  She is still having pain on the right side, nausea, vomiting, fever, and chills.  Three days ago, CT scan without contrast was done, it showed there is 6-mm stone in the right upper ureter causing obstruction.  She had hydronephrosis on that side. She was advised to go and see me in the office, but she came back into the emergency room because she is still having lot of nausea and vomiting.  She was admitted for further management.  The patient is diabetic, take insulin, and there was some suspicion she may have ketoacidosis.  Her white count is 25,800.  Today, CT scan was repeated with contrast.  It confirmed the findings of the previous CT, 9 mm stone in the right UPJ causing obstruction, hydronephrosis, and right kidney is inflamed and edematous.  The patient is admitted for management of further control of pain.  Her blood glucose is 255 and lactic acid is 0.9, which is normal.  PHYSICAL EXAMINATION:  VITAL SIGNS:  Her blood pressure is 109/44, temperature is 98. ABDOMEN:  Soft and flat.  Liver, spleen, kidneys are not palpable. Marked tenderness right CVA.  I have reviewed the CT scan, which shows that she has 9-mm stone in the right UPJ with obstruction.  There is right hydronephrosis, no other stone on the left side in the kidney and the ureters on both sides.  IMPRESSION:  Right upper ureteral calculus with hydronephrosis.  RECOMMENDATIONS:  Continue management of the diabetes.  I think she needs to have a double-J stent.  I will put  that stent today after the office and I have discussed that with the patient and I also told the nurse to send a urine culture.  At this point, she is getting Rocephin. She had positive urine culture, she was getting Cipro, which was sensitive, but I do not know why it did not work, but I will know after the urine culture report comes back.  We are going to put a stent today. Discussed with the patient, told her about this, and she understands and wants me to go ahead and proceed.    Marissa Nestle, M.D.    MIJ/MEDQ  D:  08/13/2013  T:  08/14/2013  Job:  [id number]

## 2013-08-14 NOTE — Progress Notes (Signed)
UR completed. Patient changed to inpatient- requiring IV insulin gtt, IVF @ 125cc/hr, and IV antibiotics.

## 2013-08-14 NOTE — Progress Notes (Signed)
Inpatient Diabetes Program Recommendations  AACE/ADA: New Consensus Statement on Inpatient Glycemic Control (2013)  Target Ranges:  Prepandial:   less than 140 mg/dL      Peak postprandial:   less than 180 mg/dL (1-2 hours)      Critically ill patients:  140 - 180 mg/dL   Reason for Visit: Results for BARB, SHEAR (MRN [id number]) as of 08/14/2013 13:51  Ref. Range 08/14/2013 08:58 08/14/2013 10:01 08/14/2013 11:03 08/14/2013 12:09 08/14/2013 13:01  Glucose-Capillary Latest Range: 70-99 mg/dL 174 (H) 158 (H) 150 (H) 161 (H) 174 (H)  Results for NIKKOL, PAI (MRN [id number]) as of 08/14/2013 13:51  Ref. Range 08/14/2013 11:16  CO2 Latest Range: 19-32 mEq/L 14 (L)    Diabetes history: Type 2 Diabetes Outpatient Diabetes medications: Novolog correction, Lantus 14 units q HS, Metformin 1000 mg bid, Canagliflozin 300 mg daily.  Currently patient remains on insulin drip.   CO2 remains low.  Text paged Dr. Charlies Silvers to recommend discontinuation of    Canagliflozin 300 mg daily as this drug increases the excretion of glucose from the kidneys and may increase dehydration.  It is not recommended in patients with DKA.  Once patient ready to transition off insulin drip, please make sure she receives basal insulin at least 2 hours prior to stopping insulin drip.  Also consider rechecking A1C.  Thanks, Adah Perl, RN, BC-ADM Inpatient Diabetes Coordinator Pager [phone]

## 2013-08-15 DIAGNOSIS — N12 Tubulo-interstitial nephritis, not specified as acute or chronic: Secondary | ICD-10-CM

## 2013-08-15 DIAGNOSIS — IMO0001 Reserved for inherently not codable concepts without codable children: Secondary | ICD-10-CM

## 2013-08-15 DIAGNOSIS — E1165 Type 2 diabetes mellitus with hyperglycemia: Secondary | ICD-10-CM

## 2013-08-15 LAB — BASIC METABOLIC PANEL
BUN: 11 mg/dL (ref 6–23)
BUN: 14 mg/dL (ref 6–23)
BUN: 9 mg/dL (ref 6–23)
CALCIUM: 8.2 mg/dL — AB (ref 8.4–10.5)
CALCIUM: 8.7 mg/dL (ref 8.4–10.5)
CHLORIDE: 103 meq/L (ref 96–112)
CO2: 19 mEq/L (ref 19–32)
CO2: 20 mEq/L (ref 19–32)
CO2: 23 meq/L (ref 19–32)
CREATININE: 0.71 mg/dL (ref 0.50–1.10)
CREATININE: 0.76 mg/dL (ref 0.50–1.10)
CREATININE: 0.61 mg/dL (ref 0.50–1.10)
Calcium: 8.3 mg/dL — ABNORMAL LOW (ref 8.4–10.5)
Chloride: 102 mEq/L (ref 96–112)
Chloride: 104 mEq/L (ref 96–112)
GFR calc Af Amer: >90 mL/min (ref >90)
GFR calc Af Amer: >90 mL/min (ref >90)
GFR calc non Af Amer: >90 mL/min (ref >90)
GFR calc non Af Amer: >90 mL/min (ref >90)
GLUCOSE: 141 mg/dL — AB (ref 70–99)
Glucose, Bld: 199 mg/dL — ABNORMAL HIGH (ref 70–99)
Glucose, Bld: 161 mg/dL — ABNORMAL HIGH (ref 70–99)
Potassium: 3.2 mEq/L — ABNORMAL LOW (ref 3.7–5.3)
Potassium: 3.5 mEq/L — ABNORMAL LOW (ref 3.7–5.3)
Potassium: 3.7 mEq/L (ref 3.7–5.3)
Sodium: 138 mEq/L (ref 137–147)
Sodium: 139 mEq/L (ref 137–147)
Sodium: 139 mEq/L (ref 137–147)

## 2013-08-15 LAB — GLUCOSE, CAPILLARY
GLUCOSE-CAPILLARY: 101 mg/dL — AB (ref 70–99)
GLUCOSE-CAPILLARY: 101 mg/dL — AB (ref 70–99)
GLUCOSE-CAPILLARY: 132 mg/dL — AB (ref 70–99)
GLUCOSE-CAPILLARY: 147 mg/dL — AB (ref 70–99)
GLUCOSE-CAPILLARY: 151 mg/dL — AB (ref 70–99)
GLUCOSE-CAPILLARY: 153 mg/dL — AB (ref 70–99)
GLUCOSE-CAPILLARY: 162 mg/dL — AB (ref 70–99)
GLUCOSE-CAPILLARY: 179 mg/dL — AB (ref 70–99)
GLUCOSE-CAPILLARY: 206 mg/dL — AB (ref 70–99)
Glucose-Capillary: 147 mg/dL — ABNORMAL HIGH (ref 70–99)
Glucose-Capillary: 149 mg/dL — ABNORMAL HIGH (ref 70–99)
Glucose-Capillary: 163 mg/dL — ABNORMAL HIGH (ref 70–99)
Glucose-Capillary: 173 mg/dL — ABNORMAL HIGH (ref 70–99)
Glucose-Capillary: 199 mg/dL — ABNORMAL HIGH (ref 70–99)
Glucose-Capillary: 217 mg/dL — ABNORMAL HIGH (ref 70–99)

## 2013-08-15 LAB — HEPATIC FUNCTION PANEL
ALBUMIN: 2.3 g/dL — AB (ref 3.5–5.2)
ALT: 12 U/L (ref 0–35)
AST: 14 U/L (ref 0–37)
Alkaline Phosphatase: 140 U/L — ABNORMAL HIGH (ref 39–117)
Total Bilirubin: 0.3 mg/dL (ref 0.3–1.2)
Total Protein: 6 g/dL (ref 6.0–8.3)

## 2013-08-15 MED ORDER — POTASSIUM CHLORIDE CRYS ER 20 MEQ PO TBCR
20.0000 meq | EXTENDED_RELEASE_TABLET | Freq: Two times a day (BID) | ORAL | Status: DC
  Administered 2013-08-15 – 2013-08-16 (×3): 20 meq via ORAL
  Filled 2013-08-15 (×3): qty 1

## 2013-08-15 MED ORDER — POTASSIUM CHLORIDE IN NACL 20-0.9 MEQ/L-% IV SOLN
INTRAVENOUS | Status: DC
  Administered 2013-08-15 – 2013-08-16 (×2): via INTRAVENOUS

## 2013-08-15 MED ORDER — INSULIN GLARGINE 100 UNIT/ML ~~LOC~~ SOLN
10.0000 [IU] | Freq: Two times a day (BID) | SUBCUTANEOUS | Status: DC
  Administered 2013-08-15 – 2013-08-16 (×3): 10 [IU] via SUBCUTANEOUS
  Filled 2013-08-15 (×5): qty 0.1

## 2013-08-15 MED ORDER — ALUM & MAG HYDROXIDE-SIMETH 200-200-20 MG/5ML PO SUSP
15.0000 mL | Freq: Four times a day (QID) | ORAL | Status: DC | PRN
  Administered 2013-08-15: 15 mL via ORAL
  Filled 2013-08-15: qty 30

## 2013-08-15 MED ORDER — HYDROMORPHONE HCL PF 1 MG/ML IJ SOLN: 0.5000 mg | INTRAMUSCULAR | Status: DC | PRN

## 2013-08-15 MED ORDER — INSULIN ASPART 100 UNIT/ML ~~LOC~~ SOLN
0.0000 [IU] | SUBCUTANEOUS | Status: DC
  Administered 2013-08-15: 2 [IU] via SUBCUTANEOUS
  Administered 2013-08-15 – 2013-08-16 (×2): 5 [IU] via SUBCUTANEOUS
  Administered 2013-08-16: 2 [IU] via SUBCUTANEOUS

## 2013-08-15 NOTE — Progress Notes (Signed)
Insulin drip stopped and new IV started in right hand.

## 2013-08-15 NOTE — Progress Notes (Signed)
Afebrile general status good c/o frequency i told her it is because of stent wbc down to 16.6 from 25000. Urine c/s no growth. She can be d/c home am with cipro 250mg  bid for 3days call my office Monday to schedule r renal esl on Wednesday.Marland Kitchen i have explained all this to the patient and her nurse.

## 2013-08-15 NOTE — Progress Notes (Signed)
[facility] PROGRESS NOTE  Melissa Schneider [id number] DOB: 1956-05-21 DOA: 08/13/2013 PCP: Horatio Pel, MD ENDOCRINOLOGIST, DR. NIDA    Code Status: FULL CODE Family Communication: None available Disposition Plan: Discharge to home when clinically appropriate   Consultants:  Urology  Procedures:  Right ureteral stent placement 08/13/13.  Antibiotics:  Rocephin 08/13/13>>  HPI/Subjective: The patient feels better, but complains of frequent urination. She denies pain with urination she denies flank pain. She denies vomiting, but did have mild nausea this morning. She ate about half of her breakfast.  Objective: Filed Vitals:   08/15/13 0900  BP: 114/79  Pulse: 93  Temp:   Resp: 13   temperature 98.7. Oxygen saturation 99%.    Intake/Output Summary (Last 24 hours) at 08/15/13 1021 Last data filed at 08/15/13 0800  Gross per 24 hour  Intake 3644.85 ml  Output   2300 ml  Net 1344.85 ml   Filed Weights   08/13/13 [id number] 08/13/13 1405 08/14/13 0500  Weight: 70.308 kg (155 lb) 70.5 kg (155 lb 6.8 oz) 70.5 kg (155 lb 6.8 oz)    Exam:   General:  58 year old woman, in no acute distress.  Cardiovascular: S1, S2, no murmurs rubs or gallops.  Respiratory: Decreased breath sounds in the bases, otherwise clear. Breathing is nonlabored.  Abdomen: Positive bowel sounds, soft, mild right flank tenderness, otherwise no distention, guarding, or masses palpated.  Musculoskeletal: No pedal edema. No acute hot red joints.  Neurologic: Grossly nonfocal.   Data Reviewed: Basic Metabolic Panel:  Recent Labs Lab 08/13/13 1125 08/13/13 1359  08/14/13 1733 08/14/13 2120 08/14/13 2356 08/15/13 0425 08/15/13 0843  NA 138 138  < > 139 139 138 139 139  K 4.1 4.1  < > 3.2* 3.3* 3.5* 3.7 3.2*  CL 102 102  < > 103 104 104 103 102  CO2 13* 13*  < > 18* 20 20 19 23   GLUCOSE 140* 160*  < > 188* 139* 141* 199* 161*  BUN 29* 27*  < > 20 16 14 11 9   CREATININE  0.95 0.91  < > 1.01 0.86 0.76 0.71 0.61  CALCIUM 8.6 8.8  < > 8.8 8.6 8.3* 8.2* 8.7  MG  --  1.8  --   --   --   --   --   --   PHOS  --  2.3  --   --   --   --   --   --   < > = values in this interval not displayed. Liver Function Tests:  Recent Labs Lab 08/10/13 1503 08/13/13 1359 08/14/13 0420 08/15/13 0425  AST 17 20 17 14   ALT 12 13 12 12   ALKPHOS 119* 147* 191* 140*  BILITOT 0.3 0.2* 0.2* 0.3  PROT 7.8 6.5 6.0 6.0  ALBUMIN 3.8 2.6* 2.3* 2.3*    Recent Labs Lab 08/10/13 1503 08/13/13 0651  LIPASE 32 10*   No results found for this basename: AMMONIA,  in the last 168 hours CBC:  Recent Labs Lab 08/10/13 1503 08/13/13 0651 08/13/13 1359 08/14/13 0420  WBC 19.0* 25.8* 19.5* 16.6*  NEUTROABS 17.4* 22.6* 16.0*  --   HGB 12.1 12.1 10.4* 10.3*  HCT 37.5 36.4 32.0* 32.3*  MCV 65.6* 65.9* 66.3* 66.5*  PLT 321 252 244 214   Cardiac Enzymes: No results found for this basename: CKTOTAL, CKMB, CKMBINDEX, TROPONINI,  in the last 168 hours BNP (last 3 results) No results found for this basename: PROBNP,  in the  last 8760 hours CBG:  Recent Labs Lab 08/15/13 0446 08/15/13 0552 08/15/13 0712 08/15/13 0815 08/15/13 0918  GLUCAP 199* 179* 163* 151* 206*    Recent Results (from the past 240 hour(s))  URINE CULTURE     Status: None   Collection Time    08/10/13  3:25 PM      Result Value Range Status   Specimen Description URINE, CLEAN CATCH   Final   Special Requests NONE   Final   Culture  Setup Time     Final   Value: 08/11/2013 00:24     Performed at [facility]     Final   Value: >=100,000 COLONIES/ML     Performed at [facility]   Culture     Final   Value: KLEBSIELLA PNEUMONIAE     Performed at [facility]   Report Status 08/12/2013 FINAL   Final   Organism ID, Bacteria KLEBSIELLA PNEUMONIAE   Final  URINE CULTURE     Status: None   Collection Time    08/13/13  6:56 AM      Result Value Range Status    Specimen Description URINE, CLEAN CATCH   Final   Special Requests NONE   Final   Culture  Setup Time     Final   Value: 08/13/2013 17:22     Performed at [facility]     Final   Value: NO GROWTH     Performed at [facility]   Culture     Final   Value: NO GROWTH     Performed at [facility]   Report Status 08/14/2013 FINAL   Final  CULTURE, BLOOD (ROUTINE X 2)     Status: None   Collection Time    08/13/13  9:14 AM      Result Value Range Status   Specimen Description Blood RIGHT ARM   Final   Special Requests BOTTLES DRAWN AEROBIC AND ANAEROBIC 8 CC EACH   Final   Culture NO GROWTH <24 HRS   Final   Report Status PENDING   Incomplete  CULTURE, BLOOD (ROUTINE X 2)     Status: None   Collection Time    08/13/13  9:21 AM      Result Value Range Status   Specimen Description Blood RIGHT ARM   Final   Special Requests BOTTLES DRAWN AEROBIC AND ANAEROBIC 10 CC EACH   Final   Culture NO GROWTH <24 HRS   Final   Report Status PENDING   Incomplete  SURGICAL PCR SCREEN     Status: None   Collection Time    08/13/13  2:00 PM      Result Value Range Status   MRSA, PCR NEGATIVE  NEGATIVE Final   Staphylococcus aureus NEGATIVE  NEGATIVE Final   Comment:            The Xpert SA Assay (FDA     approved for NASAL specimens     in patients over 90 years of age),     is one component of     a comprehensive surveillance     program.  Test performance has     been validated by [employer] for patients greater     than or equal to 64 year old.     It is not intended     to diagnose infection nor to  guide or monitor treatment.  URINE CULTURE     Status: None   Collection Time    08/13/13  4:00 PM      Result Value Range Status   Specimen Description URINE, CATHETERIZED   Final   Special Requests NONE   Final   Culture  Setup Time     Final   Value: 08/14/2013 01:26     Performed at [place]     Final    Value: NO GROWTH     Performed at [facility]   Culture     Final   Value: NO GROWTH     Performed at [facility]   Report Status 08/14/2013 FINAL   Final  URINE CULTURE     Status: None   Collection Time    08/13/13  5:10 PM      Result Value Range Status   Specimen Description URINE, CATHETERIZED RIGHT KIDNEY   Final   Special Requests ROCEPHIN 1gm   Final   Culture  Setup Time     Final   Value: 08/14/2013 01:18     Performed at [place]     Final   Value: NO GROWTH     Performed at [facility]   Culture     Final   Value: NO GROWTH     Performed at [facility]   Report Status 08/14/2013 FINAL   Final     Studies: Dg C-arm 1-60 Min  08/13/2013   CLINICAL DATA:  Cystoscopy with right-sided stent placement  EXAM: DG C-ARM 1-60 MIN  COMPARISON:  CT abdomen pelvis -08/13/2013  FLUOROSCOPY TIME:  2 min, 18 seconds  FINDINGS: Seven spot intraoperative fluoroscopic images during right-sided ureteral stent placement are provided for review.  Initial image demonstrates a catheter overlying the expected location of the superior aspect of the right ureter and right renal pelvis. There is an ill-defined opacity overlying the suspected location of superior aspect of the right ureter which may represent the partially obstructing ureteral stone demonstrated on previously performed abdominal CT.  Subsequent images demonstrate placement of a right-sided double-J ureteral stent with superior coil overlying the expected location of the renal pelvis and inferior coil overlying the expected location of the urinary bladder, though note, contrast was not injected during this examination.  There is a linear structure which overlies the left hemipelvis which may be external to the patient.  IMPRESSION: 1. Interval placement of right-sided double-J ureteral stent. 2. Ill-defined opacity overlying the expected location the superior aspect of the right  ureter may represent the partially obstructing stone demonstrated on prior abdominal CT. 3. Linear structure overlying the left hemipelvis may be external to the patient. Clinical correlation is advised.   Electronically Signed   By: Sandi Mariscal M.D.   On: 08/13/2013 18:52    Scheduled Meds: . cefTRIAXone (ROCEPHIN)  IV  1 g Intravenous Q24H  . levothyroxine  50 mcg Oral QAC breakfast  . losartan  25 mg Oral Daily  . simvastatin  20 mg Oral q1800  . sodium chloride  3 mL Intravenous Q12H   Continuous Infusions: . dextrose 5 % and 0.45% NaCl 125 mL/hr at 08/15/13 0800  . insulin (NOVOLIN-R) infusion 2.4 mL/hr at 08/15/13 5366   Assessment:  Principal Problem:   DKA (diabetic ketoacidoses) Active Problems:   Recurrent obstructive pyelonephritis   DM (diabetes mellitus), type 2, uncontrolled  Obstructive uropathy   Stone in kidney   HYPOTHYROIDISM   HYPERCHOLESTEROLEMIA   Leukocytosis, unspecified   1. Diabetes mellitus with DKA. Her gap is closing. It is now 14. She is currently eating, so will discontinue the insulin drip and start sliding scale NovoLog and Lantus. We'll hold oral agents for now.  Recurrent obstructive pyelonephritis. Urine culture from 08/10/13 revealed Klebsiella pneumoniae, sensitive to ceftriaxone. Followup urine culture reveals no growth. Continue ceftriaxone.  Right sided ureteropelvic kidney stone with associated right hydronephrosis. Status post right ureteral stent placement on 08/13/13 by Dr. Michela Pitcher. Currently stable and improving.  Hypertension. Currently stable on losartan.  Hypothyroidism. Stable on Synthroid.  Mild hypokalemia. Likely secondary to correction of hyperglycemia/DKA.    Plan: 1. Start sliding scale NovoLog and Lantus. We'll hold off on starting oral agents for now. 2. Supplement/replete potassium chloride. 3. Transferred to mid--surg bed. 4. Strict ins and outs. 5. Discharge within the next 24-48 hours.   Time spent: 35  minutes    [facility] Pager [phone]. If 7PM-7AM, please contact night-coverage at [email or web address], password [facility] 08/15/2013, 10:21 AM  LOS: 2 days

## 2013-08-16 DIAGNOSIS — E1110 Type 2 diabetes mellitus with ketoacidosis without coma: Secondary | ICD-10-CM | POA: Diagnosis present

## 2013-08-16 DIAGNOSIS — N139 Obstructive and reflux uropathy, unspecified: Secondary | ICD-10-CM

## 2013-08-16 LAB — GLUCOSE, CAPILLARY
GLUCOSE-CAPILLARY: 157 mg/dL — AB (ref 70–99)
Glucose-Capillary: 76 mg/dL (ref 70–99)
Glucose-Capillary: 126 mg/dL — ABNORMAL HIGH (ref 70–99)
Glucose-Capillary: 229 mg/dL — ABNORMAL HIGH (ref 70–99)

## 2013-08-16 LAB — CBC
HCT: 32.5 % — ABNORMAL LOW (ref 36.0–46.0)
Hemoglobin: 10.7 g/dL — ABNORMAL LOW (ref 12.0–15.0)
MCH: 21.3 pg — AB (ref 26.0–34.0)
MCHC: 32.9 g/dL (ref 30.0–36.0)
MCV: 64.7 fL — ABNORMAL LOW (ref 78.0–100.0)
PLATELETS: 238 10*3/uL (ref 150–400)
RBC: 5.02 MIL/uL (ref 3.87–5.11)
RDW: 15.7 % — AB (ref 11.5–15.5)
WBC: 7.5 10*3/uL (ref 4.0–10.5)

## 2013-08-16 LAB — BASIC METABOLIC PANEL WITH GFR
BUN: 7 mg/dL (ref 6–23)
CO2: 23 meq/L (ref 19–32)
Calcium: 8.3 mg/dL — ABNORMAL LOW (ref 8.4–10.5)
Chloride: 101 meq/L (ref 96–112)
Creatinine, Ser: 0.6 mg/dL (ref 0.50–1.10)
GFR calc Af Amer: >90 mL/min
GFR calc non Af Amer: >90 mL/min
Glucose, Bld: 142 mg/dL — ABNORMAL HIGH (ref 70–99)
Potassium: 4 meq/L (ref 3.7–5.3)
Sodium: 140 meq/L (ref 137–147)

## 2013-08-16 MED ORDER — CIPROFLOXACIN HCL 250 MG PO TABS: 500.0000 mg | ORAL_TABLET | Freq: Two times a day (BID) | ORAL | Status: DC

## 2013-08-16 NOTE — Progress Notes (Signed)
Pt's accu check 76 per NT. Pt given orange juice. She states this is what she usually takes when her blood sugar drops. Will continue to monitor.

## 2013-08-16 NOTE — Care Management Utilization Note (Signed)
UR complete    Autym Siess,MSN,RN [phone] 

## 2013-08-16 NOTE — Discharge Summary (Signed)
Physician Discharge Summary  Melissa Schneider [id number] DOB: 1956-01-19 DOA: 08/13/2013  PCP: Horatio Pel, MD  Admit date: 08/13/2013 Discharge date: 08/16/2013  Time spent: 40 minutes  Recommendations for Outpatient Follow-up:  1. Called Dr. Silvano Rusk office tomorrow to schedule outpatient appointment for ESWL 2. Followup with Dr. Dorris Fetch as scheduled. 3. Followup primary care physician in the next one to 2 weeks.  Discharge Diagnoses:  Principal Problem:   DKA (diabetic ketoacidoses) Active Problems:   HYPOTHYROIDISM   DM (diabetes mellitus), type 2, uncontrolled   HYPERCHOLESTEROLEMIA   Leukocytosis, unspecified   Obstructive uropathy   Stone in kidney   Recurrent obstructive pyelonephritis   DKA (diabetic ketoacidosis)   Discharge Condition: improved  Diet recommendation: low salt, low carb  Filed Weights   08/14/13 0500 08/15/13 2031 08/16/13 0700  Weight: 70.5 kg (155 lb 6.8 oz) 71.986 kg (158 lb 11.2 oz) 72.576 kg (160 lb)    History of present illness:  58 year old female with past medical history of diabetes, hypertension, hypothyroidism, dyslipidemia who presented to [facility] ED 08/13/2013 with complaints of ongoing non bloody nausea, vomiting, back pain especially on the right side and associated fevers. She reported symptoms started about few days prior to this admission. She reported pain to be 8/10 in intensity, at rest and with moving, not relieved with ibuprofen or percocet at home. She denies having blood in urine but she did endorse cloudy urine. No reports of chest pain but did feel palpitations. No shortness of breath. No loss of consciousness.  In ED, BP was 109/44, HR 95, Tmax 98.9 F and oxygen saturation 100% on room air. CT abd revealed obstructing calculus at the right ureteropelvic junction with hydronephrosis and renal edema on the right. WBC count was 25.8. In addition, she was found to be in DKA with AG of 32 and her creatinine was elevated at 1.17.  Urology consulted for hydronephrosis. Started on insulin drip for DKA.   Hospital Course:  This lady was admitted to the hospital with nausea, vomiting, back pain associated on the right side, as well as fevers. She was found to have diabetic ketoacidosis with an anion gap of 32, likely precipitated by a obstructing calculus at the right ureteropelvic junction with hydronephrosis. Patient was started on intravenous antibiotics and admitted to the step down unit. She was started on an insulin infusion per DKA protocol was aggressively hydrated. She was seen by urology and underwent stent placement in the right ureter. Plans are for outpatient ESWL. Regarding her diabetes, with conservative treatment, her anion gap closed and she was transitioned from insulin infusion back to subcutaneous insulin. She will followup with her endocrinologist as previously scheduled. Today, the patient's blood sugars are controlled and she is anxious to discharge home. She's not having any fevers or significant back pain.  Procedures:  Right ureteral stent placement 08/13/13  Consultations:  urology  Discharge Exam: Filed Vitals:   08/16/13 0700  BP: 126/76  Pulse:   Temp: 97.2 F (36.2 C)  Resp:     General: NAD Cardiovascular: S1, s2 RRR Respiratory: CTA B  Discharge Instructions  Discharge Orders   Future Orders Complete By Expires   (HEART FAILURE PATIENTS) Call MD:  Anytime you have any of the following symptoms: 1) 3 pound weight gain in 24 hours or 5 pounds in 1 week 2) shortness of breath, with or without a dry hacking cough 3) swelling in the hands, feet or stomach 4) if you have to sleep on  extra pillows at night in order to breathe.  As directed    Call MD for:  persistant dizziness or light-headedness  As directed    Call MD for:  persistant nausea and vomiting  As directed    Call MD for:  temperature >100.4  As directed    Diet - low sodium heart healthy  As directed    Increase activity  slowly  As directed        Medication List         ciprofloxacin 250 MG tablet  Commonly known as:  CIPRO  Take 2 tablets (500 mg total) by mouth every 12 (twelve) hours.     insulin aspart 100 UNIT/ML injection  Commonly known as:  novoLOG  Inject 0-17 Units into the skin 3 (three) times daily before meals. SLIDING SCALE: FOR BLOOD SUGAR OF 121-150 TAKE 2 UNITS; FOR  BLOOD SUGAR OF 151-200 TAKE 4 UNITS; FOR BLOOD SUGAR OF 201-250 TAKE 7 UNITS; FOR BLOOD SUGAR OF 251-300 TAKE 9 UNITS; FOR BLOOD SUGAR OF 301-350 TAKE 12 UNITS; FOR BLOOD SUGAR OF 351-400 UNITS, TAKE 15 UNITS; FOR BLOOD SUGAR OF GREATER THAN 400 TAKE 17 UNITS AND CALL YOUR DOCTOR.     insulin glargine 100 UNIT/ML injection  Commonly known as:  LANTUS  Inject 14 Units into the skin at bedtime.     INVOKANA 300 MG Tabs  Generic drug:  Canagliflozin  Take 1 tablet by mouth daily.     levothyroxine 50 MCG tablet  Commonly known as:  SYNTHROID, LEVOTHROID  Take 50 mcg by mouth daily.     losartan 25 MG tablet  Commonly known as:  COZAAR  Take 25 mg by mouth daily.     metFORMIN 1000 MG tablet  Commonly known as:  GLUCOPHAGE  Take 1,000 mg by mouth 2 (two) times daily with a meal.     ondansetron 4 MG tablet  Commonly known as:  ZOFRAN  Take 1 tablet (4 mg total) by mouth every 6 (six) hours.     oxyCODONE-acetaminophen 5-325 MG per tablet  Commonly known as:  PERCOCET/ROXICET  Take 1-2 tablets by mouth every 4 (four) hours as needed for severe pain.     pravastatin 40 MG tablet  Commonly known as:  PRAVACHOL  Take 40 mg by mouth daily.     SUMAtriptan 100 MG tablet  Commonly known as:  IMITREX  Take 100 mg by mouth every 2 (two) hours as needed. For migraines       Allergies  Allergen Reactions  . Lisinopril     REACTION: coughing       Follow-up Information   Follow up with Horatio Pel, MD. Schedule an appointment as soon as possible for a visit in 2 weeks.   Specialty:  Internal  Medicine   Contact information:   [place] [address] [phone]       Follow up with Marissa Nestle, MD. (call on monday for appointment)    Specialty:  Urology   Contact information:   [address] [place] [id number] [phone]       Follow up with NIDA,GEBRESELASSIE, MD. (as scheduled)    Specialty:  Endocrinology   Contact information:   [place] [address] [phone]        The results of significant diagnostics from this hospitalization (including imaging, microbiology, ancillary and laboratory) are listed below for reference.    Significant Diagnostic Studies: Ct Abdomen  Pelvis Wo Contrast  08/13/2013   CLINICAL DATA:  Known kidney stones  EXAM: CT ABDOMEN AND PELVIS WITHOUT CONTRAST  TECHNIQUE: Multidetector CT imaging of the abdomen and pelvis was performed following the standard protocol without intravenous contrast.  COMPARISON:  CT ABD/PELV WO CM dated 08/10/2013  FINDINGS: Renal: There is renal edema of the right kidney and mild hydronephrosis. This secondary to obstructing calculus measuring 9 mm at the right ureteral pelvic junction (image 40, series 2). No additional nephrolithiasis evident within the left right kidney. There is no more distal ureteral calculi or bladder calculi. The UPJ calculus is evident on the CT scout  Lung bases are clear. No focal hepatic lesion is noncontrast exam. The gallbladder, pancreas, spleen, and adrenal glands are normal.  The stomach, small bowel, cecum are normal. The colon and rectosigmoid colon are normal.  No free fluid the pelvis. The uterus and bladder normal. No pelvic lymphadenopathy.  IMPRESSION: Obstructing calculus at the right ureteropelvic junction with hydronephrosis and renal edema on the right. This calculus is evident on the CT scout.   Electronically Signed   By: Suzy Bouchard M.D.   On: 08/13/2013 08:34   Ct Abdomen Pelvis Wo  Contrast  08/10/2013   CLINICAL DATA:  Right flank pain, vomiting, history diabetes, gastroparesis, anemia  EXAM: CT ABDOMEN AND PELVIS WITHOUT CONTRAST  TECHNIQUE: Multidetector CT imaging of the abdomen and pelvis was performed following the standard protocol without intravenous contrast. Sagittal and coronal MPR images reconstructed from axial data set.  COMPARISON:  None  FINDINGS: Minimal bibasilar atelectasis.  Right hydronephrosis secondary to a 6 mm diameter calculus at the proximal right ureter image 44.  Right kidney is enlarged with significant perinephric edema.  Distal ureters decompressed.  No additional urinary tract calcification.  Bladder unremarkable.  Within limits of a nonenhanced exam, no focal abnormalities of the liver, spleen, left kidney, pancreas, or adrenal glands identified.  Stomach and bowel loops unremarkable for technique.  Unremarkable uterus, adnexae and appendix.  No mass, adenopathy, free fluid, hernia, or acute bone lesion.  IMPRESSION: Right hydronephrosis secondary to a 6 mm diameter proximal right ureteral calculus.   Electronically Signed   By: Lavonia Dana M.D.   On: 08/10/2013 18:37   Dg C-arm 1-60 Min  08/13/2013   CLINICAL DATA:  Cystoscopy with right-sided stent placement  EXAM: DG C-ARM 1-60 MIN  COMPARISON:  CT abdomen pelvis -08/13/2013  FLUOROSCOPY TIME:  2 min, 18 seconds  FINDINGS: Seven spot intraoperative fluoroscopic images during right-sided ureteral stent placement are provided for review.  Initial image demonstrates a catheter overlying the expected location of the superior aspect of the right ureter and right renal pelvis. There is an ill-defined opacity overlying the suspected location of superior aspect of the right ureter which may represent the partially obstructing ureteral stone demonstrated on previously performed abdominal CT.  Subsequent images demonstrate placement of a right-sided double-J ureteral stent with superior coil overlying the  expected location of the renal pelvis and inferior coil overlying the expected location of the urinary bladder, though note, contrast was not injected during this examination.  There is a linear structure which overlies the left hemipelvis which may be external to the patient.  IMPRESSION: 1. Interval placement of right-sided double-J ureteral stent. 2. Ill-defined opacity overlying the expected location the superior aspect of the right ureter may represent the partially obstructing stone demonstrated on prior abdominal CT. 3. Linear structure overlying the left hemipelvis may be external to the patient.  Clinical correlation is advised.   Electronically Signed   By: Sandi Mariscal M.D.   On: 08/13/2013 18:52    Microbiology: Recent Results (from the past 240 hour(s))  URINE CULTURE     Status: None   Collection Time    08/10/13  3:25 PM      Result Value Range Status   Specimen Description URINE, CLEAN CATCH   Final   Special Requests NONE   Final   Culture  Setup Time     Final   Value: 08/11/2013 00:24     Performed at [facility]     Final   Value: >=100,000 COLONIES/ML     Performed at [facility]   Culture     Final   Value: KLEBSIELLA PNEUMONIAE     Performed at [facility]   Report Status 08/12/2013 FINAL   Final   Organism ID, Bacteria KLEBSIELLA PNEUMONIAE   Final  URINE CULTURE     Status: None   Collection Time    08/13/13  6:56 AM      Result Value Range Status   Specimen Description URINE, CLEAN CATCH   Final   Special Requests NONE   Final   Culture  Setup Time     Final   Value: 08/13/2013 17:22     Performed at [facility]     Final   Value: NO GROWTH     Performed at [facility]   Culture     Final   Value: NO GROWTH     Performed at [facility]   Report Status 08/14/2013 FINAL   Final  CULTURE, BLOOD (ROUTINE X 2)     Status: None   Collection Time    08/13/13  9:14 AM       Result Value Range Status   Specimen Description BLOOD RIGHT ARM   Final   Special Requests BOTTLES DRAWN AEROBIC AND ANAEROBIC 8CC   Final   Culture NO GROWTH 3 DAYS   Final   Report Status PENDING   Incomplete  CULTURE, BLOOD (ROUTINE X 2)     Status: None   Collection Time    08/13/13  9:21 AM      Result Value Range Status   Specimen Description BLOOD RIGHT ARM   Final   Special Requests BOTTLES DRAWN AEROBIC AND ANAEROBIC 10CC   Final   Culture NO GROWTH 3 DAYS   Final   Report Status PENDING   Incomplete  SURGICAL PCR SCREEN     Status: None   Collection Time    08/13/13  2:00 PM      Result Value Range Status   MRSA, PCR NEGATIVE  NEGATIVE Final   Staphylococcus aureus NEGATIVE  NEGATIVE Final   Comment:            The Xpert SA Assay (FDA     approved for NASAL specimens     in patients over 51 years of age),     is one component of     a comprehensive surveillance     program.  Test performance has     been validated by [employer] for patients greater     than or equal to 41 year old.     It is not intended     to diagnose infection nor to     guide or monitor treatment.  URINE CULTURE     Status: None   Collection Time    08/13/13  4:00 PM      Result Value Range Status   Specimen Description URINE, CATHETERIZED   Final   Special Requests NONE   Final   Culture  Setup Time     Final   Value: 08/14/2013 01:26     Performed at [facility]     Final   Value: NO GROWTH     Performed at [facility]   Culture     Final   Value: NO GROWTH     Performed at [facility]   Report Status 08/14/2013 FINAL   Final  URINE CULTURE     Status: None   Collection Time    08/13/13  5:10 PM      Result Value Range Status   Specimen Description URINE, CATHETERIZED RIGHT KIDNEY   Final   Special Requests ROCEPHIN 1gm   Final   Culture  Setup Time     Final   Value: 08/14/2013 01:18     Performed at [facility]     Final   Value: NO GROWTH     Performed at [facility]   Culture     Final   Value: NO GROWTH     Performed at [facility]   Report Status 08/14/2013 FINAL   Final     Labs: Basic Metabolic Panel:  Recent Labs Lab 08/13/13 1125 08/13/13 1359  08/14/13 2120 08/14/13 2356 08/15/13 0425 08/15/13 0843 08/16/13 0512  NA 138 138  < > 139 138 139 139 140  K 4.1 4.1  < > 3.3* 3.5* 3.7 3.2* 4.0  CL 102 102  < > 104 104 103 102 101  CO2 13* 13*  < > 20 20 19 23 23   GLUCOSE 140* 160*  < > 139* 141* 199* 161* 142*  BUN 29* 27*  < > 16 14 11 9 7   CREATININE 0.95 0.91  < > 0.86 0.76 0.71 0.61 0.60  CALCIUM 8.6 8.8  < > 8.6 8.3* 8.2* 8.7 8.3*  MG  --  1.8  --   --   --   --   --   --   PHOS  --  2.3  --   --   --   --   --   --   < > = values in this interval not displayed. Liver Function Tests:  Recent Labs Lab 08/10/13 1503 08/13/13 1359 08/14/13 0420 08/15/13 0425  AST 17 20 17 14   ALT 12 13 12 12   ALKPHOS 119* 147* 191* 140*  BILITOT 0.3 0.2* 0.2* 0.3  PROT 7.8 6.5 6.0 6.0  ALBUMIN 3.8 2.6* 2.3* 2.3*    Recent Labs Lab 08/10/13 1503 08/13/13 0651  LIPASE 32 10*   No results found for this basename: AMMONIA,  in the last 168 hours CBC:  Recent Labs Lab 08/10/13 1503 08/13/13 0651 08/13/13 1359 08/14/13 0420 08/16/13 0512  WBC 19.0* 25.8* 19.5* 16.6* 7.5  NEUTROABS 17.4* 22.6* 16.0*  --   --   HGB 12.1 12.1 10.4* 10.3* 10.7*  HCT 37.5 36.4 32.0* 32.3* 32.5*  MCV 65.6* 65.9* 66.3* 66.5* 64.7*  PLT 321 252 244 214 238   Cardiac Enzymes: No results found for this basename: CKTOTAL, CKMB, CKMBINDEX, TROPONINI,  in the last 168 hours BNP: BNP (last 3 results)  No results found for this basename: PROBNP,  in the last 8760 hours CBG:  Recent Labs Lab 08/15/13 2313 08/16/13 0419 08/16/13 0526 08/16/13 0719 08/16/13 1121  GLUCAP 101* 76 157* 126* 229*       Signed:  Dejae Bernet  [facility] 08/16/2013, 7:34  PM

## 2013-08-16 NOTE — Progress Notes (Signed)
Pt verbalizes understanding of d/c instructions, medications and follow up appts needed with PCP, Endocrinology and Urology. Pt has no questions at this time. Pt did not receive prescription for cipro at d/c because she is to complete the course that she started prior to admission. IV was d/c without complications. Reminded pt to check for her belongings, including her ring and hearing aid, and informed pt that we are not responsible for any items left behind, pt verbalizes understanding of this. Pt d/c via wheelchair, accompanied by myself. pts husband met [place] at main entrance and will be driving pt home. Melissa Schneider

## 2013-08-17 ENCOUNTER — Encounter (HOSPITAL_COMMUNITY): Payer: Self-pay | Admitting: Pharmacy Technician

## 2013-08-17 ENCOUNTER — Encounter (HOSPITAL_COMMUNITY): Payer: Self-pay | Admitting: Urology

## 2013-08-17 NOTE — Op Note (Signed)
NAME:  Melissa Schneider, Melissa Schneider             ACCOUNT NO.:  [id number]  MEDICAL RECORD NO.:  [id number]  LOCATION:                                FACILITY:  [facility]  PHYSICIAN:  Marissa Nestle, M.D.DATE OF BIRTH:  1955-08-20  DATE OF PROCEDURE:  08/13/2013 DATE OF DISCHARGE:  08/16/2013                              OPERATIVE REPORT   PREOPERATIVE DIAGNOSIS:  Right upper ureteral calculus with pyelonephritis and hydronephrosis.  POSTOPERATIVE DIAGNOSIS:  Right upper ureteral calculus with pyelonephritis and hydronephrosis.  PROCEDURE:  Cystoscopy, right ureteral stent placement with interpretation.  ANESTHESIA:  MAC.  DESCRIPTION OF PROCEDURE:  The patient under MAC anesthesia in lithotomy position.  After usual prep and drape, a #25 cystoscope was introduced into the bladder.  It was inspected.  Right ureteral orifice which looks gaping was catheterized with an open-end catheter and the guidewire went up into the renal pelvis, and then I advanced the open-end catheter on the guidewire which went up into the renal pelvis.  Guidewire was removed.  Hydronephrotic drip was obtained.  Urine was collected for culture.  Guidewire was reintroduced and open-end catheter was removed. After removing the open-end catheter, I introduced double-J stent size 5- French 24 cm, and without string, it was advanced over the guidewire. Under fluoroscopic control, the stent goes into the renal pelvis. Guidewire was then retrieved and __________ loop was obtained in the kidney.  After removing the remaining guidewire, __________ loop in the bladder was also obtained.  Calcification __________ along the stent in the upper ureter.  All the instruments were removed.  The patient left the operating room in satisfactory condition.     Marissa Nestle, M.D.     MIJ/MEDQ  D:  08/13/2013  T:  08/14/2013  Job:  [id number]

## 2013-08-18 ENCOUNTER — Encounter (HOSPITAL_COMMUNITY): Payer: Self-pay

## 2013-08-18 ENCOUNTER — Encounter (HOSPITAL_COMMUNITY)
Admission: RE | Admit: 2013-08-18 | Discharge: 2013-08-18 | Disposition: A | Discharge status: Home / Self Care | Payer: 59 | Source: Ambulatory Visit

## 2013-08-18 LAB — CULTURE, BLOOD (ROUTINE X 2)
Culture: NO GROWTH
Culture: NO GROWTH

## 2013-08-18 NOTE — Patient Instructions (Signed)
Melissa Schneider  08/18/2013   Your procedure is scheduled on:  08/19/2013  Report to [facility] at  1330 PM.  Call this number if you have problems the morning of surgery: [phone]   Remember:   Do not eat food or drink liquids after midnight.   Take these medicines the morning of surgery with A SIP OF WATER:  Synthroid, losartan, zofran, oxycodone, sumariptane( if needed). No diabetic medicine am of surgery.   Do not wear jewelry, make-up or nail polish.  Do not wear lotions, powders, or perfumes.   Do not shave 48 hours prior to surgery. Men may shave face and neck.  Do not bring valuables to the hospital.  [facility] is not responsible for any belongings or valuables.               Contacts, dentures or bridgework may not be worn into surgery.  Leave suitcase in the car. After surgery it may be brought to your room.  For patients admitted to the hospital, discharge time is determined by your  treatment team.               Patients discharged the day of surgery will not be allowed to drive home.  Name and phone number of your driver: family  Special Instructions: N/A   Please read over the following fact sheets that you were given: Pain Booklet, Coughing and Deep Breathing, Surgical Site Infection Prevention, Anesthesia Post-op Instructions and Care and Recovery After Surgery Lithotripsy for Kidney Stones Lithotripsy is a treatment that can sometimes help eliminate kidney stones and pain that they cause. A form of lithotripsy, also known as extracorporeal shock wave lithotripsy, is a nonsurgical procedure that helps your body rid itself of the kidney stone when it is too big to pass on its own. Extracorporeal shock wave lithotripsy is a method of crushing a kidney stone with shock waves. These shock waves pass through your body and are focused on your stone. They cause the kidney stones to crumble while still in the urinary tract. It is then easier for the smaller pieces of  stone to pass in the urine. Lithotripsy usually takes about an hour. It is done in a hospital, a lithotripsy center, or a mobile unit. It usually does not require an overnight stay. Your health care provider will instruct you on preparation for the procedure. Your health care provider will tell you what to expect afterward. LET [facility] CARE PROVIDER KNOW ABOUT:  Any allergies you have.  All medicines you are taking, including vitamins, herbs, eye drops, creams, and over-the-counter medicines.  Previous problems you or members of your family have had with the use of anesthetics.  Any blood disorders you have.  Previous surgeries you have had.  Medical conditions you have. RISKS AND COMPLICATIONS Generally, lithotripsy for kidney stones is a safe procedure. However, as with any procedure, complications can occur. Possible complications include:  Infection.  Bleeding of the kidney.  Bruising of the kidney or skin.  Obstruction of the ureter.  Failure of the stone to fragment. BEFORE THE PROCEDURE  Do not eat or drink for 6 8 hours prior to the procedure. You may, however, take the medications with a sip of water that your physician instructs you to take  Do not take aspirin or aspirin-containing products for 7 days prior to your procedure  Do not take nonsteroidal anti-inflammatory products for 7 days prior to your procedure PROCEDURE A  stent (flexible tube with holes) may be placed in your ureter. The ureter is the tube that transports the urine from the kidneys to the bladder. Your health care provider may place a stent before the procedure. This will help keep urine flowing from the kidney if the fragments of the stone block the ureter. You may have an IV tube placed in one of your veins to give you fluids and medicines. These medicines may help you relax or make you sleep. During the procedure, you will lie comfortably on a fluid-filled cushion or in a warm-water bath. After  an X-ray or ultrasound exam to locate your stone, shock waves are aimed at the stone. If you are awake, you may feel a tapping sensation as the shock waves pass through your body. If large stone particles remain after treatment, a second procedure may be necessary at a later date. For comfort during the test:  Relax as much as possible.  Try to remain still as much as possible.  Try to follow instructions to speed up the test.  Let your health care provider know if you are uncomfortable, anxious, or in pain. AFTER THE PROCEDURE  After surgery, you will be taken to the recovery area. A nurse will watch and check your progress. Once you're awake, stable, and taking fluids well, you will be allowed to go home as long as there are no problems. You will also be allowed to pass your urine before discharge.You may be given antibiotics to help prevent infection. You may also be prescribed pain medicine if needed. In a week or two, your health care provider may remove your stent, if you have one. You may first have an X-ray exam to check on how successful the fragmentation of your stone has been and how much of the stone has passed. Your health care provider will check to see whether or not stone particles remain. SEEK IMMEDIATE MEDICAL CARE IF:  You develop a fever or shaking chills.  Your pain is not relieved by medicine.  You feel sick to your stomach (nauseated) and you vomit.  You develop heavy bleeding.  You have difficulty urinating.  You start to pass your stent from your penis. Document Released: 06/29/2000 Document Revised: 04/22/2013 Document Reviewed: 01/15/2013 [facility] Patient Information 2014 [place]. PATIENT INSTRUCTIONS POST-ANESTHESIA  IMMEDIATELY FOLLOWING SURGERY:  Do not drive or operate machinery for the first twenty four hours after surgery.  Do not make any important decisions for twenty four hours after surgery or while taking narcotic pain medications or  sedatives.  If you develop intractable nausea and vomiting or a severe headache please notify your doctor immediately.  FOLLOW-UP:  Please make an appointment with your surgeon as instructed. You do not need to follow up with anesthesia unless specifically instructed to do so.  WOUND CARE INSTRUCTIONS (if applicable):  Keep a dry clean dressing on the anesthesia/puncture wound site if there is drainage.  Once the wound has quit draining you may leave it open to air.  Generally you should leave the bandage intact for twenty four hours unless there is drainage.  If the epidural site drains for more than 36-48 hours please call the anesthesia department.  QUESTIONS?:  Please feel free to call your physician or the hospital operator if you have any questions, and they will be happy to assist you.

## 2013-08-18 NOTE — H&P (Signed)
She has a stone rt upper ureter double J stent was placed. She is schedule to have ESL rt upper ureteral calculus. No change in previous H &P.

## 2013-08-19 ENCOUNTER — Ambulatory Visit (HOSPITAL_COMMUNITY): Payer: 59

## 2013-08-19 ENCOUNTER — Encounter (HOSPITAL_COMMUNITY): Payer: Self-pay | Admitting: *Deleted

## 2013-08-19 ENCOUNTER — Ambulatory Visit (HOSPITAL_COMMUNITY)
Admission: RE | Admit: 2013-08-19 | Discharge: 2013-08-19 | Disposition: A | Discharge status: Home / Self Care | Payer: 59 | Source: Ambulatory Visit | Attending: Urology | Admitting: Urology

## 2013-08-19 ENCOUNTER — Encounter (HOSPITAL_COMMUNITY)
Admission: RE | Disposition: A | Discharge status: Home / Self Care | Payer: Self-pay | Source: Ambulatory Visit | Attending: Urology

## 2013-08-19 DIAGNOSIS — Z794 Long term (current) use of insulin: Secondary | ICD-10-CM | POA: Insufficient documentation

## 2013-08-19 DIAGNOSIS — Z01812 Encounter for preprocedural laboratory examination: Secondary | ICD-10-CM | POA: Insufficient documentation

## 2013-08-19 DIAGNOSIS — E119 Type 2 diabetes mellitus without complications: Secondary | ICD-10-CM | POA: Insufficient documentation

## 2013-08-19 DIAGNOSIS — N201 Calculus of ureter: Secondary | ICD-10-CM | POA: Insufficient documentation

## 2013-08-19 HISTORY — PX: EXTRACORPOREAL SHOCK WAVE LITHOTRIPSY: SHX1557

## 2013-08-19 LAB — GLUCOSE, CAPILLARY: Glucose-Capillary: 252 mg/dL — ABNORMAL HIGH (ref 70–99)

## 2013-08-19 SURGERY — LITHOTRIPSY, ESWL
Anesthesia: Moderate Sedation | Laterality: Right

## 2013-08-19 MED ORDER — DIAZEPAM 5 MG PO TABS
10.0000 mg | ORAL_TABLET | Freq: Once | ORAL | Status: AC
  Administered 2013-08-19: 10 mg via ORAL

## 2013-08-19 MED ORDER — DIPHENHYDRAMINE HCL 25 MG PO CAPS
25.0000 mg | ORAL_CAPSULE | Freq: Once | ORAL | Status: AC
  Administered 2013-08-19: 25 mg via ORAL

## 2013-08-19 MED ORDER — SODIUM CHLORIDE 0.9 % IV SOLN: INTRAVENOUS | Status: DC

## 2013-08-19 MED ORDER — DIPHENHYDRAMINE HCL 25 MG PO CAPS
ORAL_CAPSULE | ORAL | Status: AC
  Filled 2013-08-19: qty 1

## 2013-08-19 MED ORDER — DIAZEPAM 5 MG PO TABS
ORAL_TABLET | ORAL | Status: AC
  Filled 2013-08-19: qty 2

## 2013-08-19 SURGICAL SUPPLY — 3 items
CLOTH BEACON ORANGE TIMEOUT ST (SAFETY) IMPLANT
GOWN STRL REUS W/TWL LRG LVL3 (GOWN DISPOSABLE) IMPLANT
TOWEL OR 17X26 4PK STRL BLUE (TOWEL DISPOSABLE) IMPLANT

## 2013-08-19 NOTE — Discharge Instructions (Signed)
Lithotripsy, Care After °Refer to this sheet in the next few weeks. These instructions provide you with information on caring for yourself after your procedure. Your health care provider may also give you more specific instructions. Your treatment has been planned according to current medical practices, but problems sometimes occur. Call your health care provider if you have any problems or questions after your procedure. °WHAT TO EXPECT AFTER THE PROCEDURE  °· Your urine may have a red tinge for a few days after treatment. Blood loss is usually minimal. °· You may have soreness in the back or flank area. This usually goes away after a few days. The procedure can cause blotches or bruises on the back where the pressure wave enters the skin. These marks usually cause only minimal discomfort and should disappear in a short time. °· Stone fragments should begin to pass within 24 hours of treatment. However, a delayed passage is not unusual. °· You may have pain, discomfort, and feel sick to your stomach (nauseated) when the crushed fragments of stone are passed down the tube from the kidney to the bladder. Stone fragments can pass soon after the procedure and may last for up to 4 8 weeks. °· A small number of patients may have severe pain when stone fragments are not able to pass, which leads to an obstruction. °· If your stone is greater than 1 inch (2.5 cm) in diameter or if you have multiple stones that have a combined diameter greater than 1 inch (2.5 cm), you may require more than one treatment. °· If you had a stent placed prior to your procedure, you may experience some discomfort, especially during urination. You may experience the pain or discomfort in your flank or back, or you may experience a sharp pain or discomfort at the base of your penis or in your lower abdomen. The discomfort usually lasts only a few minutes after urinating. °HOME CARE INSTRUCTIONS  °· Rest at home until you feel your energy  improving. °· Only take over-the-counter or prescription medicines for pain, discomfort, or fever as directed by your health care provider. Depending on the type of lithotripsy, you may need to take antibiotics and anti-inflammatory medicines for a few days. °· Drink enough water and fluids to keep your urine clear or pale yellow. This helps "flush" your kidneys. It helps pass any remaining pieces of stone and prevents stones from coming back. °· Most people can resume daily activities within 1 2 days after standard lithotripsy. It can take longer to recover from laser and percutaneous lithotripsy. °· If the stones are in your urinary system, you may be asked to strain your urine at home to look for stones. Any stones that are found can be sent to a medical lab for examination. °· Visit your health care provider for a follow-up appointment in a few weeks. Your doctor may remove your stent if you have one. Your health care provider will also check to see whether stone particles still remain. °SEEK MEDICAL CARE IF:  °· Your pain is not relieved by medicine. °· You have a lasting nauseous feeling. °· You feel there is too much blood in the urine. °· You develop persistent problems with frequent or painful urination that does not at least partially improve after 2 days following the procedure. °· You have a congested cough. °· You feel lightheaded. °· You develop a rash or any other signs that might suggest an allergic problem. °· You develop any reaction or side   effects to your medicine(s). °SEEK IMMEDIATE MEDICAL CARE IF:  °· You experience severe back or flank pain or both. °· You see nothing but blood when you urinate. °· You cannot pass any urine at all. °· You have a fever or shaking chills. °· You develop shortness of breath, difficulty breathing, or chest pain. °· You develop vomiting that will not stop after 6 8 hours. °· You have a fainting episode. °Document Released: 07/22/2007 Document Revised: 04/22/2013  Document Reviewed: 01/15/2013 °[employer]® Patient Information ©2014 [employer]. ° °

## 2013-08-20 ENCOUNTER — Encounter (HOSPITAL_COMMUNITY): Payer: Self-pay | Admitting: Urology

## 2013-08-22 ENCOUNTER — Inpatient Hospital Stay (HOSPITAL_COMMUNITY)
Admission: EM | Admit: 2013-08-22 | Discharge: 2013-08-24 | DRG: 638 | Disposition: A | Discharge status: Home / Self Care | Payer: 59 | Attending: Internal Medicine | Admitting: Internal Medicine

## 2013-08-22 ENCOUNTER — Encounter (HOSPITAL_COMMUNITY): Payer: Self-pay | Admitting: Emergency Medicine

## 2013-08-22 ENCOUNTER — Emergency Department (HOSPITAL_COMMUNITY): Payer: 59

## 2013-08-22 DIAGNOSIS — E1110 Type 2 diabetes mellitus with ketoacidosis without coma: Secondary | ICD-10-CM | POA: Diagnosis present

## 2013-08-22 DIAGNOSIS — E860 Dehydration: Secondary | ICD-10-CM | POA: Diagnosis present

## 2013-08-22 DIAGNOSIS — E876 Hypokalemia: Secondary | ICD-10-CM | POA: Diagnosis present

## 2013-08-22 DIAGNOSIS — E1165 Type 2 diabetes mellitus with hyperglycemia: Secondary | ICD-10-CM

## 2013-08-22 DIAGNOSIS — E871 Hypo-osmolality and hyponatremia: Secondary | ICD-10-CM | POA: Diagnosis present

## 2013-08-22 DIAGNOSIS — E039 Hypothyroidism, unspecified: Secondary | ICD-10-CM | POA: Diagnosis present

## 2013-08-22 DIAGNOSIS — I10 Essential (primary) hypertension: Secondary | ICD-10-CM | POA: Diagnosis present

## 2013-08-22 DIAGNOSIS — Z87442 Personal history of urinary calculi: Secondary | ICD-10-CM

## 2013-08-22 DIAGNOSIS — E7800 Pure hypercholesterolemia, unspecified: Secondary | ICD-10-CM | POA: Diagnosis present

## 2013-08-22 DIAGNOSIS — D72829 Elevated white blood cell count, unspecified: Secondary | ICD-10-CM | POA: Diagnosis present

## 2013-08-22 DIAGNOSIS — E1310 Other specified diabetes mellitus with ketoacidosis without coma: Principal | ICD-10-CM | POA: Diagnosis present

## 2013-08-22 DIAGNOSIS — H9190 Unspecified hearing loss, unspecified ear: Secondary | ICD-10-CM | POA: Diagnosis present

## 2013-08-22 DIAGNOSIS — Z794 Long term (current) use of insulin: Secondary | ICD-10-CM

## 2013-08-22 DIAGNOSIS — IMO0002 Reserved for concepts with insufficient information to code with codable children: Secondary | ICD-10-CM | POA: Diagnosis present

## 2013-08-22 DIAGNOSIS — R112 Nausea with vomiting, unspecified: Secondary | ICD-10-CM | POA: Diagnosis present

## 2013-08-22 DIAGNOSIS — R82998 Other abnormal findings in urine: Secondary | ICD-10-CM | POA: Diagnosis present

## 2013-08-22 DIAGNOSIS — D649 Anemia, unspecified: Secondary | ICD-10-CM | POA: Diagnosis present

## 2013-08-22 DIAGNOSIS — N390 Urinary tract infection, site not specified: Secondary | ICD-10-CM | POA: Diagnosis present

## 2013-08-22 DIAGNOSIS — R1115 Cyclical vomiting syndrome unrelated to migraine: Secondary | ICD-10-CM

## 2013-08-22 DIAGNOSIS — R531 Weakness: Secondary | ICD-10-CM | POA: Diagnosis present

## 2013-08-22 LAB — CBC
HEMATOCRIT: 41.1 % (ref 36.0–46.0)
Hemoglobin: 13.5 g/dL (ref 12.0–15.0)
MCH: 21.7 pg — ABNORMAL LOW (ref 26.0–34.0)
MCHC: 32.8 g/dL (ref 30.0–36.0)
MCV: 66.2 fL — ABNORMAL LOW (ref 78.0–100.0)
Platelets: 508 10*3/uL — ABNORMAL HIGH (ref 150–400)
RBC: 6.21 MIL/uL — AB (ref 3.87–5.11)
RDW: 16.6 % — AB (ref 11.5–15.5)
WBC: 18.9 10*3/uL — AB (ref 4.0–10.5)

## 2013-08-22 LAB — BASIC METABOLIC PANEL
BUN: 31 mg/dL — ABNORMAL HIGH (ref 6–23)
BUN: 30 mg/dL — AB (ref 6–23)
BUN: 30 mg/dL — ABNORMAL HIGH (ref 6–23)
BUN: 25 mg/dL — AB (ref 6–23)
BUN: 22 mg/dL (ref 6–23)
BUN: 20 mg/dL (ref 6–23)
BUN: 18 mg/dL (ref 6–23)
CALCIUM: 8 mg/dL — AB (ref 8.4–10.5)
CALCIUM: 7.7 mg/dL — AB (ref 8.4–10.5)
CHLORIDE: 80 meq/L — AB (ref 96–112)
CHLORIDE: 83 meq/L — AB (ref 96–112)
CHLORIDE: 88 meq/L — AB (ref 96–112)
CHLORIDE: 96 meq/L (ref 96–112)
CHLORIDE: 99 meq/L (ref 96–112)
CHLORIDE: 101 meq/L (ref 96–112)
CO2: 5 meq/L — AB (ref 19–32)
CO2: 8 mEq/L — CL (ref 19–32)
CO2: 8 mEq/L — CL (ref 19–32)
CO2: 11 mEq/L — ABNORMAL LOW (ref 19–32)
CO2: 12 meq/L — AB (ref 19–32)
CO2: 12 meq/L — AB (ref 19–32)
CO2: 14 mEq/L — ABNORMAL LOW (ref 19–32)
CREATININE: 0.92 mg/dL (ref 0.50–1.10)
CREATININE: 0.8 mg/dL (ref 0.50–1.10)
CREATININE: 0.73 mg/dL (ref 0.50–1.10)
CREATININE: 0.72 mg/dL (ref 0.50–1.10)
Calcium: 9.7 mg/dL (ref 8.4–10.5)
Calcium: 9.8 mg/dL (ref 8.4–10.5)
Calcium: 9.2 mg/dL (ref 8.4–10.5)
Calcium: 8.5 mg/dL (ref 8.4–10.5)
Calcium: 8.2 mg/dL — ABNORMAL LOW (ref 8.4–10.5)
Chloride: 100 mEq/L (ref 96–112)
Creatinine, Ser: 0.99 mg/dL (ref 0.50–1.10)
Creatinine, Ser: 0.96 mg/dL (ref 0.50–1.10)
Creatinine, Ser: 0.73 mg/dL (ref 0.50–1.10)
GFR calc Af Amer: 72 mL/min — ABNORMAL LOW (ref >90)
GFR calc Af Amer: >90 mL/min (ref >90)
GFR calc Af Amer: >90 mL/min (ref >90)
GFR calc Af Amer: >90 mL/min (ref >90)
GFR calc non Af Amer: 63 mL/min — ABNORMAL LOW (ref >90)
GFR calc non Af Amer: 68 mL/min — ABNORMAL LOW (ref >90)
GFR calc non Af Amer: 81 mL/min — ABNORMAL LOW (ref >90)
GFR calc non Af Amer: >90 mL/min (ref >90)
GFR, EST AFRICAN AMERICAN: 75 mL/min — AB (ref >90)
GFR, EST AFRICAN AMERICAN: 79 mL/min — AB (ref >90)
GFR, EST NON AFRICAN AMERICAN: 65 mL/min — AB (ref >90)
GLUCOSE: 162 mg/dL — AB (ref 70–99)
GLUCOSE: 201 mg/dL — AB (ref 70–99)
Glucose, Bld: 388 mg/dL — ABNORMAL HIGH (ref 70–99)
Glucose, Bld: 296 mg/dL — ABNORMAL HIGH (ref 70–99)
Glucose, Bld: 216 mg/dL — ABNORMAL HIGH (ref 70–99)
Glucose, Bld: 135 mg/dL — ABNORMAL HIGH (ref 70–99)
Glucose, Bld: 188 mg/dL — ABNORMAL HIGH (ref 70–99)
POTASSIUM: 3.3 meq/L — AB (ref 3.7–5.3)
POTASSIUM: 3.3 meq/L — AB (ref 3.7–5.3)
Potassium: 3.5 mEq/L — ABNORMAL LOW (ref 3.7–5.3)
Potassium: 3.3 mEq/L — ABNORMAL LOW (ref 3.7–5.3)
Potassium: 3.3 mEq/L — ABNORMAL LOW (ref 3.7–5.3)
Potassium: 4 mEq/L (ref 3.7–5.3)
Potassium: 4 mEq/L (ref 3.7–5.3)
SODIUM: 128 meq/L — AB (ref 137–147)
SODIUM: 131 meq/L — AB (ref 137–147)
SODIUM: 132 meq/L — AB (ref 137–147)
SODIUM: 135 meq/L — AB (ref 137–147)
Sodium: 135 mEq/L — ABNORMAL LOW (ref 137–147)
Sodium: 136 mEq/L — ABNORMAL LOW (ref 137–147)
Sodium: 136 mEq/L — ABNORMAL LOW (ref 137–147)

## 2013-08-22 LAB — CBC WITH DIFFERENTIAL/PLATELET
BASOS PCT: 0 % (ref 0–1)
Basophils Absolute: 0 10*3/uL (ref 0.0–0.1)
Eosinophils Absolute: 0 10*3/uL (ref 0.0–0.7)
Eosinophils Relative: 0 % (ref 0–5)
HCT: 38.8 % (ref 36.0–46.0)
HEMOGLOBIN: 12.5 g/dL (ref 12.0–15.0)
LYMPHS PCT: 5 % — AB (ref 12–46)
Lymphs Abs: 0.9 10*3/uL (ref 0.7–4.0)
MCH: 21.3 pg — ABNORMAL LOW (ref 26.0–34.0)
MCHC: 32.2 g/dL (ref 30.0–36.0)
MCV: 66.2 fL — ABNORMAL LOW (ref 78.0–100.0)
MONOS PCT: 5 % (ref 3–12)
Monocytes Absolute: 0.9 10*3/uL (ref 0.1–1.0)
NEUTROS PCT: 90 % — AB (ref 43–77)
Neutro Abs: 15.5 10*3/uL — ABNORMAL HIGH (ref 1.7–7.7)
PLATELETS: 578 10*3/uL — AB (ref 150–400)
RBC: 5.86 MIL/uL — AB (ref 3.87–5.11)
RDW: 16.6 % — ABNORMAL HIGH (ref 11.5–15.5)
WBC: 17.3 10*3/uL — AB (ref 4.0–10.5)

## 2013-08-22 LAB — URINALYSIS, ROUTINE W REFLEX MICROSCOPIC
GLUCOSE, UA: 500 mg/dL — AB
LEUKOCYTES UA: NEGATIVE
Nitrite: NEGATIVE
PH: 5.5 (ref 5.0–8.0)
PROTEIN: 100 mg/dL — AB
Specific Gravity, Urine: 1.025 (ref 1.005–1.030)
Urobilinogen, UA: 0.2 mg/dL (ref 0.0–1.0)

## 2013-08-22 LAB — GLUCOSE, CAPILLARY
GLUCOSE-CAPILLARY: 132 mg/dL — AB (ref 70–99)
GLUCOSE-CAPILLARY: 158 mg/dL — AB (ref 70–99)
GLUCOSE-CAPILLARY: 173 mg/dL — AB (ref 70–99)
GLUCOSE-CAPILLARY: 168 mg/dL — AB (ref 70–99)
GLUCOSE-CAPILLARY: 192 mg/dL — AB (ref 70–99)
Glucose-Capillary: 396 mg/dL — ABNORMAL HIGH (ref 70–99)
Glucose-Capillary: 250 mg/dL — ABNORMAL HIGH (ref 70–99)
Glucose-Capillary: 132 mg/dL — ABNORMAL HIGH (ref 70–99)
Glucose-Capillary: 115 mg/dL — ABNORMAL HIGH (ref 70–99)
Glucose-Capillary: 124 mg/dL — ABNORMAL HIGH (ref 70–99)
Glucose-Capillary: 194 mg/dL — ABNORMAL HIGH (ref 70–99)

## 2013-08-22 LAB — HEPATIC FUNCTION PANEL
ALT: 8 U/L (ref 0–35)
AST: 11 U/L (ref 0–37)
Albumin: 3.6 g/dL (ref 3.5–5.2)
Alkaline Phosphatase: 139 U/L — ABNORMAL HIGH (ref 39–117)
Bilirubin, Direct: <0.2 mg/dL (ref 0.0–0.3)
TOTAL PROTEIN: 8.5 g/dL — AB (ref 6.0–8.3)
Total Bilirubin: 0.2 mg/dL — ABNORMAL LOW (ref 0.3–1.2)

## 2013-08-22 LAB — BLOOD GAS, ARTERIAL
Acid-base deficit: 22.6 mmol/L — ABNORMAL HIGH (ref 0.0–2.0)
Bicarbonate: 4.7 mEq/L — ABNORMAL LOW (ref 20.0–24.0)
Drawn by: 23534
FIO2: 0.21 %
O2 Content: 21 L/min
O2 Saturation: 97.5 %
PH ART: 7.182 — AB (ref 7.350–7.450)
PO2 ART: 119 mmHg — AB (ref 80.0–100.0)
Patient temperature: 37
TCO2: 4.4 mmol/L (ref 0–100)
pCO2 arterial: 13 mmHg — CL (ref 35.0–45.0)

## 2013-08-22 LAB — MAGNESIUM: Magnesium: 1.9 mg/dL (ref 1.5–2.5)

## 2013-08-22 LAB — LIPASE, BLOOD: LIPASE: 13 U/L (ref 11–59)

## 2013-08-22 LAB — URINE MICROSCOPIC-ADD ON

## 2013-08-22 LAB — TROPONIN I

## 2013-08-22 MED ORDER — CHLORHEXIDINE GLUCONATE 0.12 % MT SOLN
15.0000 mL | Freq: Two times a day (BID) | OROMUCOSAL | Status: DC
  Administered 2013-08-22 – 2013-08-24 (×4): 15 mL via OROMUCOSAL
  Filled 2013-08-22 (×4): qty 15

## 2013-08-22 MED ORDER — PROMETHAZINE HCL 25 MG/ML IJ SOLN
12.5000 mg | Freq: Once | INTRAMUSCULAR | Status: AC
  Administered 2013-08-22: 12.5 mg via INTRAVENOUS
  Filled 2013-08-22: qty 1

## 2013-08-22 MED ORDER — BIOTENE DRY MOUTH MT LIQD
15.0000 mL | Freq: Two times a day (BID) | OROMUCOSAL | Status: DC
  Administered 2013-08-22 – 2013-08-24 (×4): 15 mL via OROMUCOSAL

## 2013-08-22 MED ORDER — SODIUM CHLORIDE 0.9 % IV BOLUS (SEPSIS)
2000.0000 mL | Freq: Once | INTRAVENOUS | Status: AC
  Administered 2013-08-22: 2000 mL via INTRAVENOUS

## 2013-08-22 MED ORDER — SODIUM CHLORIDE 0.9 % IV SOLN: INTRAVENOUS | Status: DC

## 2013-08-22 MED ORDER — ONDANSETRON HCL 4 MG/2ML IJ SOLN: 4.0000 mg | Freq: Four times a day (QID) | INTRAMUSCULAR | Status: DC | PRN

## 2013-08-22 MED ORDER — POTASSIUM CHLORIDE 10 MEQ/100ML IV SOLN
10.0000 meq | INTRAVENOUS | Status: AC
  Administered 2013-08-22 (×4): 10 meq via INTRAVENOUS
  Filled 2013-08-22 (×4): qty 100

## 2013-08-22 MED ORDER — SIMVASTATIN 20 MG PO TABS
20.0000 mg | ORAL_TABLET | Freq: Every day | ORAL | Status: DC
  Administered 2013-08-22 – 2013-08-23 (×2): 20 mg via ORAL
  Filled 2013-08-22 (×2): qty 1

## 2013-08-22 MED ORDER — ACETAMINOPHEN 325 MG PO TABS: 650.0000 mg | ORAL_TABLET | Freq: Four times a day (QID) | ORAL | Status: DC | PRN

## 2013-08-22 MED ORDER — DEXTROSE 50 % IV SOLN: 25.0000 mL | INTRAVENOUS | Status: DC | PRN

## 2013-08-22 MED ORDER — ENOXAPARIN SODIUM 40 MG/0.4ML ~~LOC~~ SOLN
40.0000 mg | SUBCUTANEOUS | Status: DC
  Administered 2013-08-22 – 2013-08-23 (×2): 40 mg via SUBCUTANEOUS
  Filled 2013-08-22 (×2): qty 0.4

## 2013-08-22 MED ORDER — POTASSIUM CHLORIDE 10 MEQ/100ML IV SOLN
10.0000 meq | INTRAVENOUS | Status: DC
  Administered 2013-08-22: 10 meq via INTRAVENOUS
  Filled 2013-08-22: qty 100

## 2013-08-22 MED ORDER — LEVOTHYROXINE SODIUM 25 MCG PO TABS
50.0000 ug | ORAL_TABLET | Freq: Every day | ORAL | Status: DC
  Administered 2013-08-22 – 2013-08-24 (×3): 50 ug via ORAL
  Filled 2013-08-22 (×3): qty 2

## 2013-08-22 MED ORDER — SODIUM CHLORIDE 0.9 % IV SOLN
INTRAVENOUS | Status: DC
  Administered 2013-08-22: 1.9 [IU]/h via INTRAVENOUS
  Filled 2013-08-22: qty 1

## 2013-08-22 MED ORDER — DEXTROSE-NACL 5-0.45 % IV SOLN
INTRAVENOUS | Status: DC
Start: 2013-08-22 — End: 2013-08-23

## 2013-08-22 MED ORDER — PANTOPRAZOLE SODIUM 40 MG IV SOLR
40.0000 mg | Freq: Every day | INTRAVENOUS | Status: DC
  Administered 2013-08-22 – 2013-08-23 (×2): 40 mg via INTRAVENOUS
  Filled 2013-08-22 (×2): qty 40

## 2013-08-22 MED ORDER — DEXTROSE-NACL 5-0.45 % IV SOLN
INTRAVENOUS | Status: DC
  Administered 2013-08-22: 15:00:00 via INTRAVENOUS

## 2013-08-22 MED ORDER — DEXTROSE 5 % IV SOLN
1.0000 g | INTRAVENOUS | Status: DC
  Administered 2013-08-22 – 2013-08-23 (×2): 1 g via INTRAVENOUS
  Filled 2013-08-22 (×2): qty 10

## 2013-08-22 MED ORDER — SODIUM CHLORIDE 0.9 % IV BOLUS (SEPSIS)
1000.0000 mL | Freq: Once | INTRAVENOUS | Status: AC
  Administered 2013-08-22: 1000 mL via INTRAVENOUS

## 2013-08-22 MED ORDER — DEXTROSE 5 % IV SOLN
INTRAVENOUS | Status: AC
  Filled 2013-08-22: qty 10

## 2013-08-22 NOTE — ED Notes (Signed)
CRITICAL VALUE ALERT  Critical value received:  Co2 5, blood gas ph 7.18, Co2 13.0, O2 119, Bicarb 4.7, O2% 97.5,   Date of notification:  08/22/2013  Time of notification:  12:31  Critical value read back: yes  Nurse who received alert:  Luis Abed   MD notified (1st page):  Dr Thurnell Garbe  Time of first page: 12:31  MD notified (2nd page):  Time of second page:  Responding MD:  Dr. Thurnell Garbe  Time MD responded:  12:31

## 2013-08-22 NOTE — ED Notes (Signed)
Insulin drip moved to left wrist iv,

## 2013-08-22 NOTE — ED Notes (Signed)
Dr Thurnell Garbe in prior to RN, see edp assessment for further,

## 2013-08-22 NOTE — ED Notes (Signed)
Patient with c/o shortness of breath, generalized weakness, nausea. Patient with Lithotripsy done 08/19/13, has been sick since. Patient c/o hyperglycemia. Alert/oriented x 4. States took 11 units insulin novolog PTA due to CBG at home over 400. CBG 396 in triage.

## 2013-08-22 NOTE — ED Provider Notes (Signed)
CSN: [id number]     Arrival date & time 08/22/13  1106 History   First MD Initiated Contact with Patient 08/22/13 1157     Chief Complaint  Patient presents with  . Abdominal Pain  . Hyperglycemia    HPI Pt was seen at 1200. Per pt and her family, c/o gradual onset and persistence of constant "high blood sugars" for the past 1 day. Pt states she has been nauseated with decreased PO intake for the past 3 days. States she has been taking her insulin and her CBG's have been "running in the 200's." States this morning her CBG was "over 400," so she gave herself insulin without improvement in her CBG. Has been associated with generalized weakness/fatigue. Pt has significant hx of DKA and "thinks I'm in it again." Pt is most recently s/p lithotripsy and stent placement for right ureteral calculi on 08/19/13. Denies vomiting/diarrhea, no abd pain, no back pain, no fevers, no SOB/cough, no CP/palpitations.    Past Medical History  Diagnosis Date  . Diabetes mellitus   . Hearing difficulty   . Diabetic gastroparesis 07/23/2011    Suspected  . Anemia 07/23/2011  . Microcytic anemia 07/23/2011  . [id number]    Past Surgical History  Procedure Laterality Date  . Wrist surgery Right   . Cystoscopy w/ ureteral stent placement Right 08/13/2013    Procedure: CYSTOSCOPY WITH RETROGRADE PYELOGRAM/URETERAL STENT PLACEMENT;  Surgeon: Marissa Nestle, MD;  Location: [facility];  Service: Urology;  Laterality: Right;  . Extracorporeal shock wave lithotripsy Right 08/19/2013    Procedure: EXTRACORPOREAL SHOCK WAVE LITHOTRIPSY (ESWL) RIGHT URETERAL CALCULUS;  Surgeon: Marissa Nestle, MD;  Location: [facility];  Service: Urology;  Laterality: Right;    History  Substance Use Topics  . Smoking status: Never Smoker   . Smokeless tobacco: Not on file  . Alcohol Use: No    Review of Systems ROS: Statement: All systems negative except as marked or noted in the HPI; Constitutional: Negative for fever and chills.  +generalized weakness/fatigue.; ; Eyes: Negative for eye pain, redness and discharge. ; ; ENMT: Negative for ear pain, hoarseness, nasal congestion, sinus pressure and sore throat. ; ; Cardiovascular: Negative for chest pain, palpitations, diaphoresis, dyspnea and peripheral edema. ; ; Respiratory: Negative for cough, wheezing and stridor. ; ; Gastrointestinal: +nausea, decreased PO intake. Negative for vomiting, diarrhea, abdominal pain, blood in stool, hematemesis, jaundice and rectal bleeding. . ; ; Genitourinary: Negative for dysuria, flank pain and hematuria. ; ; Musculoskeletal: Negative for back pain and neck pain. Negative for swelling and trauma.; ; Skin: Negative for pruritus, rash, abrasions, blisters, bruising and skin lesion.; ; Neuro: Negative for headache, lightheadedness and neck stiffness. Negative for weakness, altered level of consciousness , altered mental status, extremity weakness, paresthesias, involuntary movement, seizure and syncope.      Allergies  Lisinopril  Home Medications   Current Outpatient Rx  Name  Route  Sig  Dispense  Refill  . Canagliflozin (INVOKANA) 300 MG TABS   Oral   Take 1 tablet by mouth daily.         . insulin aspart (NOVOLOG) 100 UNIT/ML injection   Subcutaneous   Inject 0-17 Units into the skin 3 (three) times daily before meals. SLIDING SCALE: FOR BLOOD SUGAR OF 121-150 TAKE 2 UNITS; FOR  BLOOD SUGAR OF 151-200 TAKE 4 UNITS; FOR BLOOD SUGAR OF 201-250 TAKE 7 UNITS; FOR BLOOD SUGAR OF 251-300 TAKE 9 UNITS; FOR BLOOD SUGAR OF 301-350 TAKE 12 UNITS;  FOR BLOOD SUGAR OF 351-400 UNITS, TAKE 15 UNITS; FOR BLOOD SUGAR OF GREATER THAN 400 TAKE 17 UNITS AND CALL YOUR DOCTOR.   1 vial   6   . insulin glargine (LANTUS) 100 UNIT/ML injection   Subcutaneous   Inject 14 Units into the skin at bedtime.          Marland Kitchen levothyroxine (SYNTHROID, LEVOTHROID) 50 MCG tablet   Oral   Take 50 mcg by mouth daily.           Marland Kitchen losartan (COZAAR) 25 MG tablet    Oral   Take 25 mg by mouth daily.           . metFORMIN (GLUCOPHAGE) 1000 MG tablet   Oral   Take 1,000 mg by mouth 2 (two) times daily with a meal.         . Multiple Vitamin (MULTIVITAMIN WITH MINERALS) TABS tablet   Oral   Take 1 tablet by mouth daily.         . ondansetron (ZOFRAN) 4 MG tablet   Oral   Take 1 tablet (4 mg total) by mouth every 6 (six) hours.   12 tablet   0   . pravastatin (PRAVACHOL) 40 MG tablet   Oral   Take 40 mg by mouth daily.           . SUMAtriptan (IMITREX) 100 MG tablet   Oral   Take 100 mg by mouth every 2 (two) hours as needed. For migraines          BP 119/60  Pulse 108  Temp(Src) 98.5 F (36.9 C)  Resp 19  Ht 5\' 2"  (1.575 m)  Wt 143 lb (64.864 kg)  BMI 26.15 kg/m2  SpO2 98% Physical Exam 1205: Physical examination:  Nursing notes reviewed; Vital signs and O2 SAT reviewed;  Constitutional: Well developed, Well nourished, Uncomfortable appearing; Head:  Normocephalic, atraumatic; Eyes: EOMI, PERRL, No scleral icterus; ENMT: Mouth and pharynx normal, Mucous membranes dry; Neck: Supple, Full range of motion, No lymphadenopathy; Cardiovascular: Tachycardic rate and rhythm, No gallop; Respiratory: Breath sounds clear & equal bilaterally, No wheezes. Speaking full sentences, Tachypneic. No access mm use, no retrax.; Chest: Nontender, Movement normal; Abdomen: Soft, Nontender, Nondistended, Normal bowel sounds; Genitourinary: No CVA tenderness; Extremities: Pulses normal, No tenderness, No edema, No calf edema or asymmetry.; Neuro: AA&Ox3, Major CN grossly intact.  Speech clear. No gross focal motor or sensory deficits in extremities.; Skin: Color normal, Warm, Dry.   ED Course  Procedures   1210:  CBG elevated on arrival. IVF 2L bolus started while BMP pending.  1300:  3rd IVF bolus ordered. AG 43. DKA protocol started.  Dx and testing d/w pt and family.  Questions answered.  Verb understanding, agreeable to admit. T/C to [facility].  Algis Liming, case discussed, including:  HPI, pertinent PM/SHx, VS/PE, dx testing, ED course and treatment:  Agreeable to admit, requests to write temporary orders, obtain stepdown bed.      EKG Interpretation    Date/Time:  Saturday August 22 2013 11:46:12 EST Ventricular Rate:  100 PR Interval:  140 QRS Duration: 82 QT Interval:  400 QTC Calculation: 516 R Axis:   76 Text Interpretation:  Normal sinus rhythm Nonspecific ST abnormality Prolonged QT Baseline wander Abnormal ECG When compared with ECG of 13-Aug-2013 08:26, Nonspecific T wave abnormality no longer evident in Anterior leads QT has lengthened Confirmed by [facility]  MD, Girard Koontz [phone]) on 08/22/2013 2:38:55 PM  MDM  MDM Reviewed: previous chart, nursing note and vitals Reviewed previous: labs and ECG Interpretation: labs, ECG and x-ray Total time providing critical care: 30-74 minutes. This excludes time spent performing separately reportable procedures and services. Consults: admitting MD   CRITICAL CARE Performed by: Alfonzo Feller Total critical care time: 50 Critical care time was exclusive of separately billable procedures and treating other patients. Critical care was necessary to treat or prevent imminent or life-threatening deterioration. Critical care was time spent personally by me on the following activities: development of treatment plan with patient and/or surrogate as well as nursing, discussions with consultants, evaluation of patient's response to treatment, examination of patient, obtaining history from patient or surrogate, ordering and performing treatments and interventions, ordering and review of laboratory studies, ordering and review of radiographic studies, pulse oximetry and re-evaluation of patient's condition.   Results for orders placed during the hospital encounter of 08/22/13  GLUCOSE, CAPILLARY      Result Value Range   Glucose-Capillary 396 (*) 70 - 99 mg/dL  CBC  WITH DIFFERENTIAL      Result Value Range   WBC 17.3 (*) 4.0 - 10.5 K/uL   RBC 5.86 (*) 3.87 - 5.11 MIL/uL   Hemoglobin 12.5  12.0 - 15.0 g/dL   HCT 38.8  36.0 - 46.0 %   MCV 66.2 (*) 78.0 - 100.0 fL   MCH 21.3 (*) 26.0 - 34.0 pg   MCHC 32.2  30.0 - 36.0 g/dL   RDW 16.6 (*) 11.5 - 15.5 %   Platelets 578 (*) 150 - 400 K/uL   Neutrophils Relative % 90 (*) 43 - 77 %   Lymphocytes Relative 5 (*) 12 - 46 %   Monocytes Relative 5  3 - 12 %   Eosinophils Relative 0  0 - 5 %   Basophils Relative 0  0 - 1 %   Neutro Abs 15.5 (*) 1.7 - 7.7 K/uL   Lymphs Abs 0.9  0.7 - 4.0 K/uL   Monocytes Absolute 0.9  0.1 - 1.0 K/uL   Eosinophils Absolute 0.0  0.0 - 0.7 K/uL   Basophils Absolute 0.0  0.0 - 0.1 K/uL   WBC Morphology TOXIC GRANULATION     Smear Review LARGE PLATELETS PRESENT    BASIC METABOLIC PANEL      Result Value Range   Sodium 128 (*) 137 - 147 mEq/L   Potassium 3.5 (*) 3.7 - 5.3 mEq/L   Chloride 80 (*) 96 - 112 mEq/L   CO2 5 (*) 19 - 32 mEq/L   Glucose, Bld 388 (*) 70 - 99 mg/dL   BUN 31 (*) 6 - 23 mg/dL   Creatinine, Ser 0.99  0.50 - 1.10 mg/dL   Calcium 9.7  8.4 - 10.5 mg/dL   GFR calc non Af Amer 63 (*) >90 mL/min   GFR calc Af Amer 72 (*) >90 mL/min  URINALYSIS, ROUTINE W REFLEX MICROSCOPIC      Result Value Range   Color, Urine YELLOW  YELLOW   APPearance HAZY (*) CLEAR   Specific Gravity, Urine 1.025  1.005 - 1.030   pH 5.5  5.0 - 8.0   Glucose, UA 500 (*) NEGATIVE mg/dL   Hgb urine dipstick LARGE (*) NEGATIVE   Bilirubin Urine SMALL (*) NEGATIVE   Ketones, ur >80 (*) NEGATIVE mg/dL   Protein, ur 100 (*) NEGATIVE mg/dL   Urobilinogen, UA 0.2  0.0 - 1.0 mg/dL   Nitrite NEGATIVE  NEGATIVE   Leukocytes, UA  NEGATIVE  NEGATIVE  LIPASE, BLOOD      Result Value Range   Lipase 13  11 - 59 U/L  HEPATIC FUNCTION PANEL      Result Value Range   Total Protein 8.5 (*) 6.0 - 8.3 g/dL   Albumin 3.6  3.5 - 5.2 g/dL   AST 11  0 - 37 U/L   ALT 8  0 - 35 U/L   Alkaline  Phosphatase 139 (*) 39 - 117 U/L   Total Bilirubin 0.2 (*) 0.3 - 1.2 mg/dL   Bilirubin, Direct <0.2  0.0 - 0.3 mg/dL   Indirect Bilirubin NOT CALCULATED  0.3 - 0.9 mg/dL  BLOOD GAS, ARTERIAL      Result Value Range   FIO2 0.21     O2 Content 21.0     Delivery systems ROOM AIR     pH, Arterial 7.182 (*) 7.350 - 7.450   pCO2 arterial 13.0 (*) 35.0 - 45.0 mmHg   pO2, Arterial 119.0 (*) 80.0 - 100.0 mmHg   Bicarbonate 4.7 (*) 20.0 - 24.0 mEq/L   TCO2 4.4  0 - 100 mmol/L   Acid-base deficit 22.6 (*) 0.0 - 2.0 mmol/L   O2 Saturation 97.5     Patient temperature 37.0     Collection site RIGHT RADIAL     Drawn by [phone]     Sample type ARTERIAL     Allens test (pass/fail) PASS  PASS  TROPONIN I      Result Value Range   Troponin I <0.30  <0.30 ng/mL  BASIC METABOLIC PANEL      Result Value Range   Sodium 131 (*) 137 - 147 mEq/L   Potassium 3.3 (*) 3.7 - 5.3 mEq/L   Chloride 83 (*) 96 - 112 mEq/L   CO2 8 (*) 19 - 32 mEq/L   Glucose, Bld 296 (*) 70 - 99 mg/dL   BUN 30 (*) 6 - 23 mg/dL   Creatinine, Ser 0.96  0.50 - 1.10 mg/dL   Calcium 9.8  8.4 - 10.5 mg/dL   GFR calc non Af Amer 65 (*) >90 mL/min   GFR calc Af Amer 75 (*) >90 mL/min  CBC      Result Value Range   WBC 18.9 (*) 4.0 - 10.5 K/uL   RBC 6.21 (*) 3.87 - 5.11 MIL/uL   Hemoglobin 13.5  12.0 - 15.0 g/dL   HCT 41.1  36.0 - 46.0 %   MCV 66.2 (*) 78.0 - 100.0 fL   MCH 21.7 (*) 26.0 - 34.0 pg   MCHC 32.8  30.0 - 36.0 g/dL   RDW 16.6 (*) 11.5 - 15.5 %   Platelets 508 (*) 150 - 400 K/uL  URINE MICROSCOPIC-ADD ON      Result Value Range   WBC, UA 3-6  <3 WBC/hpf   RBC / HPF TOO NUMEROUS TO COUNT  <3 RBC/hpf   Bacteria, UA MANY (*) RARE  GLUCOSE, CAPILLARY      Result Value Range   Glucose-Capillary 250 (*) 70 - 99 mg/dL    Dg Abd Acute W/chest 08/22/2013   CLINICAL DATA:  Abdominal pain and hyperglycemia  EXAM: ACUTE ABDOMEN SERIES (ABDOMEN 2 VIEW & CHEST 1 VIEW)  COMPARISON:  Chest radiograph November 04, 2010; abdominal  radiograph August 19, 2013  FINDINGS: PA chest: There is no edema or consolidation. Heart is upper normal in size with normal pulmonary vascularity. No adenopathy.  Supine and upright abdomen: There is a double-J stent on  the right extending from the level of L1-2 to the bladder. There is moderate stool in the colon. Bowel gas pattern is unremarkable. No obstruction or free air. The previously noted proximal right ureteral calculus is not seen on this study.  IMPRESSION: Double-J stent remains on the right. The previously noted calculus in the proximal right ureteral region is not convincingly seen on this study. The bowel gas pattern is normal. No lung edema or consolidation.   Electronically Signed   By: Lowella Grip M.D.   On: 08/22/2013 13:03        Alfonzo Feller, DO 08/25/13 1135

## 2013-08-22 NOTE — ED Notes (Signed)
CRITICAL VALUE ALERT  Critical value received:  Co2 8  Date of notification:  08/22/2013  Time of notification:  15:08 Critical value read back: yes  Nurse who received alert:  Rip Harbour, RN   MD notified (1st page):  Dr Thurnell Garbe   Time of first page:  15:08  MD notified (2nd page):  Time of second page:  Responding MD:  Dr. Thurnell Garbe   Time MD responded:  15:08

## 2013-08-22 NOTE — ED Notes (Signed)
Report given to Nadine, RN.

## 2013-08-22 NOTE — H&P (Addendum)
History and Physical  Melissa Schneider [id number] DOB: 1956-02-10 DOA: 08/22/2013  Referring physician: EDP PCP: Londell Moh, MD  Outpatient Specialists:  1. Urology: Dr. Alleen Borne 2. Endocrinology: Dr. Purcell Nails  Chief Complaint: Intractable nausea, vomiting, generalized weakness and high blood sugars.  HPI: Melissa Schneider is a 58 y.o. female with history of type II DM diagnosed 3 years ago, 2 prior episodes of DKA, suspected diabetic gastroparesis, hypertension, hypothyroidism, dyslipidemia, hard of hearing out of right ear, recent hospitalization 08/13/13-08/16/13 for obstructing right UPJ calculus with hydronephrosis, UTI and DKA. She underwent right upper ureter double-J stent placement. Post discharge, patient apparently was doing well without complaints until 08/19/13. After returning home post lithotripsy on 08/19/13, she started having nausea and vomiting which has progressively gotten worse. She has several episodes of nausea and vomiting without coffee ground material or blood. This is not associated with abdominal pain, fever or chills. She is passing flatus and has not had a BM for couple of days. She has been forcing herself to eat small amounts and drink lots of fluids. She has been compliant with her oral medications and insulins. Her blood sugars had been ranging in the mid 250s but this morning was at 460 mg/dL. She still has good urine output. She denies hematuria or dysuria. According to husband, she has become progressively weak and has difficulty getting out of bed. He denies any history of confusion. She denies headache, earache, sore throat, chest pain, dyspnea, cough or palpitations. In the ED, she was found to be in frank DKA with bicarbonate of 5, glucose 388, WBC 18.9 and arterial pH of 7.182. KUB and chest x-ray without acute findings. She has been bolused with 3 L of normal saline and started on DKA protocol insulin drip by EDP. Hospitalist  admission requested. As per spouse, her A1c a couple weeks ago during visit with her endocrinologist was very good at 6.5. Last A1c documented in Epic on 1/4 was 15.9.   Review of Systems: All systems reviewed and apart from history of presenting illness, are negative.  Past Medical History  Diagnosis Date  . Diabetes mellitus   . Hearing difficulty   . Diabetic gastroparesis 07/23/2011    Suspected  . Anemia 07/23/2011  . Microcytic anemia 07/23/2011  . [id number](902.1)    Past Surgical History  Procedure Laterality Date  . Wrist surgery Right   . Cystoscopy w/ ureteral stent placement Right 08/13/2013    Procedure: CYSTOSCOPY WITH RETROGRADE PYELOGRAM/URETERAL STENT PLACEMENT;  Surgeon: Ky Barban, MD;  Location: [facility];  Service: Urology;  Laterality: Right;  . Extracorporeal shock wave lithotripsy Right 08/19/2013    Procedure: EXTRACORPOREAL SHOCK WAVE LITHOTRIPSY (ESWL) RIGHT URETERAL CALCULUS;  Surgeon: Ky Barban, MD;  Location: [facility];  Service: Urology;  Laterality: Right;   Social History:  reports that she has never smoked. She does not have any smokeless tobacco history on file. She reports that she does not drink alcohol or use illicit drugs. Married. Independent of activities of daily living.  Allergies  Allergen Reactions  . Lisinopril     Coughing    No family history on file. spouse is also diabetic.  Prior to Admission medications   Medication Sig Start Date End Date Taking? Authorizing Provider  Canagliflozin (INVOKANA) 300 MG TABS Take 1 tablet by mouth daily.   Yes Historical Provider, MD  insulin aspart (NOVOLOG) 100 UNIT/ML injection Inject 0-17 Units into the skin 3 (three) times daily before  meals. SLIDING SCALE: FOR BLOOD SUGAR OF 121-150 TAKE 2 UNITS; FOR  BLOOD SUGAR OF 151-200 TAKE 4 UNITS; FOR BLOOD SUGAR OF 201-250 TAKE 7 UNITS; FOR BLOOD SUGAR OF 251-300 TAKE 9 UNITS; FOR BLOOD SUGAR OF 301-350 TAKE 12 UNITS; FOR BLOOD SUGAR OF 351-400 UNITS,  TAKE 15 UNITS; FOR BLOOD SUGAR OF GREATER THAN 400 TAKE 17 UNITS AND CALL YOUR DOCTOR. 07/24/11  Yes Rexene Alberts, MD  insulin glargine (LANTUS) 100 UNIT/ML injection Inject 14 Units into the skin at bedtime.    Yes Historical Provider, MD  levothyroxine (SYNTHROID, LEVOTHROID) 50 MCG tablet Take 50 mcg by mouth daily.     Yes Historical Provider, MD  losartan (COZAAR) 25 MG tablet Take 25 mg by mouth daily.     Yes Historical Provider, MD  metFORMIN (GLUCOPHAGE) 1000 MG tablet Take 1,000 mg by mouth 2 (two) times daily with a meal.   Yes Historical Provider, MD  Multiple Vitamin (MULTIVITAMIN WITH MINERALS) TABS tablet Take 1 tablet by mouth daily.   Yes Historical Provider, MD  ondansetron (ZOFRAN) 4 MG tablet Take 1 tablet (4 mg total) by mouth every 6 (six) hours. 08/10/13  Yes Virgel Manifold, MD  pravastatin (PRAVACHOL) 40 MG tablet Take 40 mg by mouth daily.     Yes Historical Provider, MD  SUMAtriptan (IMITREX) 100 MG tablet Take 100 mg by mouth every 2 (two) hours as needed. For migraines   Yes Historical Provider, MD   Physical Exam: Filed Vitals:   08/22/13 1125 08/22/13 1231 08/22/13 1300 08/22/13 1400  BP: 116/70  95/70 119/60  Pulse: 105 108    Temp:  98.5 F (36.9 C)    Resp: 25 25 20 19   Height: 5\' 2"  (1.575 m)     Weight: 64.864 kg (143 lb)     SpO2: 98% 98%       General exam: Small built and thinly nourished ill-looking female patient, lying uncomfortably supine on the gurney in no obvious distress.  Head, eyes and ENT: Nontraumatic and normocephalic. Pupils equally reacting to light and accommodation. Oral mucosa dry.  Neck: Supple. No JVD, carotid bruit or thyromegaly.  Lymphatics: No lymphadenopathy.  Respiratory system: Clear to auscultation. No increased work of breathing.  Cardiovascular system: S1 and S2 heard, RRR. No JVD, murmurs, gallops, clicks or pedal edema.  Gastrointestinal system: Abdomen is nondistended, soft and nontender. Normal bowel sounds  heard. No organomegaly or masses appreciated.  Central nervous system: Somnolent but easily arousable and oriented. No focal neurological deficits. Hard of hearing out of her right ear.  Extremities: Symmetric 5 x 5 power. Peripheral pulses symmetrically felt.   Skin: No rashes or acute findings.  Musculoskeletal system: Negative exam.  Psychiatry: Pleasant and cooperative.   Labs on Admission:  Basic Metabolic Panel:  Recent Labs Lab 08/16/13 0512 08/22/13 1150 08/22/13 1312  NA 140 128* 131*  K 4.0 3.5* 3.3*  CL 101 80* 83*  CO2 23 5* 8*  GLUCOSE 142* 388* 296*  BUN 7 31* 30*  CREATININE 0.60 0.99 0.96  CALCIUM 8.3* 9.7 9.8   Liver Function Tests:  Recent Labs Lab 08/22/13 1150  AST 11  ALT 8  ALKPHOS 139*  BILITOT 0.2*  PROT 8.5*  ALBUMIN 3.6    Recent Labs Lab 08/22/13 1150  LIPASE 13   No results found for this basename: AMMONIA,  in the last 168 hours CBC:  Recent Labs Lab 08/16/13 0512 08/22/13 1150 08/22/13 1312  WBC 7.5 17.3* 18.9*  NEUTROABS  --  15.5*  --   HGB 10.7* 12.5 13.5  HCT 32.5* 38.8 41.1  MCV 64.7* 66.2* 66.2*  PLT 238 578* 508*   Cardiac Enzymes:  Recent Labs Lab 08/22/13 1150  TROPONINI <0.30    BNP (last 3 results) No results found for this basename: PROBNP,  in the last 8760 hours CBG:  Recent Labs Lab 08/16/13 0719 08/16/13 1121 08/19/13 1525 08/22/13 1128 08/22/13 1344  GLUCAP 126* 229* 252* 396* 250*    Radiological Exams on Admission: Dg Abd Acute W/chest  08/22/2013   CLINICAL DATA:  Abdominal pain and hyperglycemia  EXAM: ACUTE ABDOMEN SERIES (ABDOMEN 2 VIEW & CHEST 1 VIEW)  COMPARISON:  Chest radiograph November 04, 2010; abdominal radiograph August 19, 2013  FINDINGS: PA chest: There is no edema or consolidation. Heart is upper normal in size with normal pulmonary vascularity. No adenopathy.  Supine and upright abdomen: There is a double-J stent on the right extending from the level of L1-2 to the  bladder. There is moderate stool in the colon. Bowel gas pattern is unremarkable. No obstruction or free air. The previously noted proximal right ureteral calculus is not seen on this study.  IMPRESSION: Double-J stent remains on the right. The previously noted calculus in the proximal right ureteral region is not convincingly seen on this study. The bowel gas pattern is normal. No lung edema or consolidation.   Electronically Signed   By: Lowella Grip M.D.   On: 08/22/2013 13:03    EKG: Independently reviewed. Baseline artifacts. Sinus tachycardia at 100 beats per minute, normal axis and no acute changes. QTC 516 ms.  Assessment/Plan Principal Problem:   DKA (diabetic ketoacidosis) Active Problems:   HYPOTHYROIDISM   DM (diabetes mellitus), type 2, uncontrolled   HYPERCHOLESTEROLEMIA   UTI (urinary tract infection)   Leukocytosis, unspecified   Intractable nausea and vomiting   Hypokalemia   Dehydration   Generalized weakness   DKA, type 2   1. DKA in poorly controlled type II DM: Unclear precipitating event-? Nausea and vomiting, UTI. Admit to step down unit. Hydrate aggressively with IV normal saline. Continue insulin drip per DKA protocol and follow BMP closely. After DKA has completely resolved, transition to home Lantus and SSI. She may well need mealtime NovoLog. 2. Dehydration with hyponatremia: Secondary to intractable nausea and vomiting. Hyponatremia is also secondary to hyper glycemia. IV normal saline hydration and follow BMP. 3. Hypokalemia: Replete IV and follow BMP and magnesium. 4. Intractable nausea and vomiting: DD-diabetic gastroparesis, UTI, gastritis. No acute findings on KUB. N.p.o., IV fluids, IV PPI and when necessary Zofran. Treat underlying cause. No acute findings on abdominal exam or KUB. 5. Possible UTI: Treat with Rocephin pending urine culture results. If urine cultures are negative, DC antibiotics. 6. Leukocytosis: May be stress response to DKA versus  UTI. Management as above. Follow CBC. 7. Prolonged QTC.: Likely secondary to hypokalemia. Replete potassium and follow BMP and magnesium. Monitor on telemetry 8. Hypothyroidism: Continue Synthroid. 9. Hypertension: Patient's blood pressures are soft. Hold antihypertensives. 10. History of hypothyroidism 11. Status post recent right double-J ureteral stent and lithotripsy for obstructing renal calculus     Code Status: Full  Family Communication: Discussed with spouse at bedside in detail.  Disposition Plan: Home when medically stable   Time spent: 39 minutes  Judy Pollman, MD, FACP, FHM. [facility] Pager [phone]  If 7PM-7AM, please contact night-coverage [email or web address] Password [facility] 08/22/2013, 2:43 PM

## 2013-08-23 DIAGNOSIS — E1310 Other specified diabetes mellitus with ketoacidosis without coma: Principal | ICD-10-CM

## 2013-08-23 LAB — BASIC METABOLIC PANEL
BUN: 17 mg/dL (ref 6–23)
BUN: 16 mg/dL (ref 6–23)
BUN: 15 mg/dL (ref 6–23)
BUN: 13 mg/dL (ref 6–23)
BUN: 13 mg/dL (ref 6–23)
BUN: 11 mg/dL (ref 6–23)
CALCIUM: 8.4 mg/dL (ref 8.4–10.5)
CALCIUM: 8.5 mg/dL (ref 8.4–10.5)
CALCIUM: 8.3 mg/dL — AB (ref 8.4–10.5)
CALCIUM: 8.6 mg/dL (ref 8.4–10.5)
CHLORIDE: 101 meq/L (ref 96–112)
CHLORIDE: 103 meq/L (ref 96–112)
CO2: 17 meq/L — AB (ref 19–32)
CO2: 19 meq/L (ref 19–32)
CO2: 19 mEq/L (ref 19–32)
CO2: 18 mEq/L — ABNORMAL LOW (ref 19–32)
CO2: 17 mEq/L — ABNORMAL LOW (ref 19–32)
CO2: 19 mEq/L (ref 19–32)
CREATININE: 0.64 mg/dL (ref 0.50–1.10)
CREATININE: 0.58 mg/dL (ref 0.50–1.10)
CREATININE: 0.56 mg/dL (ref 0.50–1.10)
Calcium: 8.3 mg/dL — ABNORMAL LOW (ref 8.4–10.5)
Calcium: 8.2 mg/dL — ABNORMAL LOW (ref 8.4–10.5)
Chloride: 104 mEq/L (ref 96–112)
Chloride: 105 mEq/L (ref 96–112)
Chloride: 101 mEq/L (ref 96–112)
Chloride: 103 mEq/L (ref 96–112)
Creatinine, Ser: 0.76 mg/dL (ref 0.50–1.10)
Creatinine, Ser: 0.58 mg/dL (ref 0.50–1.10)
Creatinine, Ser: 0.61 mg/dL (ref 0.50–1.10)
GFR calc Af Amer: >90 mL/min (ref >90)
GFR calc Af Amer: >90 mL/min (ref >90)
GFR calc Af Amer: >90 mL/min (ref >90)
GFR calc Af Amer: >90 mL/min (ref >90)
GFR calc non Af Amer: >90 mL/min (ref >90)
GFR calc non Af Amer: >90 mL/min (ref >90)
GFR calc non Af Amer: >90 mL/min (ref >90)
GLUCOSE: 145 mg/dL — AB (ref 70–99)
GLUCOSE: 136 mg/dL — AB (ref 70–99)
GLUCOSE: 178 mg/dL — AB (ref 70–99)
GLUCOSE: 215 mg/dL — AB (ref 70–99)
Glucose, Bld: 168 mg/dL — ABNORMAL HIGH (ref 70–99)
Glucose, Bld: 227 mg/dL — ABNORMAL HIGH (ref 70–99)
Potassium: 3.1 mEq/L — ABNORMAL LOW (ref 3.7–5.3)
Potassium: 2.9 mEq/L — CL (ref 3.7–5.3)
Potassium: 3 mEq/L — ABNORMAL LOW (ref 3.7–5.3)
Potassium: 3.8 mEq/L (ref 3.7–5.3)
Potassium: 3.7 mEq/L (ref 3.7–5.3)
Potassium: 3.6 mEq/L — ABNORMAL LOW (ref 3.7–5.3)
SODIUM: 139 meq/L (ref 137–147)
Sodium: 136 mEq/L — ABNORMAL LOW (ref 137–147)
Sodium: 140 mEq/L (ref 137–147)
Sodium: 136 mEq/L — ABNORMAL LOW (ref 137–147)
Sodium: 135 mEq/L — ABNORMAL LOW (ref 137–147)
Sodium: 137 mEq/L (ref 137–147)

## 2013-08-23 LAB — CBC
HCT: 30.8 % — ABNORMAL LOW (ref 36.0–46.0)
Hemoglobin: 10.2 g/dL — ABNORMAL LOW (ref 12.0–15.0)
MCH: 20.6 pg — ABNORMAL LOW (ref 26.0–34.0)
MCHC: 33.1 g/dL (ref 30.0–36.0)
MCV: 62.2 fL — ABNORMAL LOW (ref 78.0–100.0)
Platelets: 467 10*3/uL — ABNORMAL HIGH (ref 150–400)
RBC: 4.95 MIL/uL (ref 3.87–5.11)
RDW: 16 % — AB (ref 11.5–15.5)
WBC: 10.1 10*3/uL (ref 4.0–10.5)

## 2013-08-23 LAB — GLUCOSE, CAPILLARY
GLUCOSE-CAPILLARY: 146 mg/dL — AB (ref 70–99)
GLUCOSE-CAPILLARY: 134 mg/dL — AB (ref 70–99)
GLUCOSE-CAPILLARY: 146 mg/dL — AB (ref 70–99)
GLUCOSE-CAPILLARY: 220 mg/dL — AB (ref 70–99)
GLUCOSE-CAPILLARY: 153 mg/dL — AB (ref 70–99)
GLUCOSE-CAPILLARY: 157 mg/dL — AB (ref 70–99)
GLUCOSE-CAPILLARY: 144 mg/dL — AB (ref 70–99)
Glucose-Capillary: 123 mg/dL — ABNORMAL HIGH (ref 70–99)
Glucose-Capillary: 134 mg/dL — ABNORMAL HIGH (ref 70–99)
Glucose-Capillary: 134 mg/dL — ABNORMAL HIGH (ref 70–99)
Glucose-Capillary: 134 mg/dL — ABNORMAL HIGH (ref 70–99)
Glucose-Capillary: 144 mg/dL — ABNORMAL HIGH (ref 70–99)
Glucose-Capillary: 168 mg/dL — ABNORMAL HIGH (ref 70–99)
Glucose-Capillary: 175 mg/dL — ABNORMAL HIGH (ref 70–99)
Glucose-Capillary: 187 mg/dL — ABNORMAL HIGH (ref 70–99)
Glucose-Capillary: 202 mg/dL — ABNORMAL HIGH (ref 70–99)
Glucose-Capillary: 218 mg/dL — ABNORMAL HIGH (ref 70–99)
Glucose-Capillary: 225 mg/dL — ABNORMAL HIGH (ref 70–99)

## 2013-08-23 LAB — URINE CULTURE
COLONY COUNT: NO GROWTH
Culture: NO GROWTH

## 2013-08-23 MED ORDER — INSULIN GLARGINE 100 UNIT/ML ~~LOC~~ SOLN
15.0000 [IU] | Freq: Every day | SUBCUTANEOUS | Status: DC
  Administered 2013-08-23: 15 [IU] via SUBCUTANEOUS
  Filled 2013-08-23 (×2): qty 0.15

## 2013-08-23 MED ORDER — POTASSIUM CHLORIDE IN NACL 20-0.9 MEQ/L-% IV SOLN
INTRAVENOUS | Status: DC
  Administered 2013-08-23 – 2013-08-24 (×2): via INTRAVENOUS

## 2013-08-23 MED ORDER — INSULIN ASPART 100 UNIT/ML ~~LOC~~ SOLN: 0.0000 [IU] | Freq: Every day | SUBCUTANEOUS | Status: DC

## 2013-08-23 MED ORDER — KCL IN DEXTROSE-NACL 20-5-0.45 MEQ/L-%-% IV SOLN
INTRAVENOUS | Status: AC
  Administered 2013-08-23 (×2): via INTRAVENOUS

## 2013-08-23 MED ORDER — SENNOSIDES-DOCUSATE SODIUM 8.6-50 MG PO TABS
2.0000 | ORAL_TABLET | Freq: Every day | ORAL | Status: DC
  Administered 2013-08-23: 2 via ORAL
  Filled 2013-08-23: qty 2

## 2013-08-23 MED ORDER — POTASSIUM CHLORIDE CRYS ER 20 MEQ PO TBCR
40.0000 meq | EXTENDED_RELEASE_TABLET | Freq: Once | ORAL | Status: AC
  Administered 2013-08-23: 40 meq via ORAL
  Filled 2013-08-23: qty 2

## 2013-08-23 MED ORDER — POLYETHYLENE GLYCOL 3350 17 G PO PACK: 17.0000 g | PACK | Freq: Every day | ORAL | Status: DC | PRN

## 2013-08-23 MED ORDER — INSULIN ASPART 100 UNIT/ML ~~LOC~~ SOLN
0.0000 [IU] | Freq: Three times a day (TID) | SUBCUTANEOUS | Status: DC
  Administered 2013-08-23: 3 [IU] via SUBCUTANEOUS
  Administered 2013-08-24: 1 [IU] via SUBCUTANEOUS
  Administered 2013-08-24: 5 [IU] via SUBCUTANEOUS

## 2013-08-23 MED ORDER — POTASSIUM CHLORIDE 10 MEQ/100ML IV SOLN
10.0000 meq | INTRAVENOUS | Status: AC
  Administered 2013-08-23 (×2): 10 meq via INTRAVENOUS
  Filled 2013-08-23: qty 100

## 2013-08-23 NOTE — Progress Notes (Signed)
PROGRESS NOTE    Melissa Schneider [id number] DOB: 09-22-1955 DOA: 08/22/2013 PCP: Horatio Pel, MD Outpatient Specialists:  1. Urology: Dr. Beaulah Dinning 2. Endocrinology: Dr. Loni Beckwith   HPI/Brief narrative 58 y.o. female with history of type II DM diagnosed 3 years ago, 2 prior episodes of DKA, suspected diabetic gastroparesis, hypertension, hypothyroidism, dyslipidemia, hard of hearing out of right ear, recent hospitalization 08/13/13-08/16/13 for obstructing right UPJ calculus with hydronephrosis, UTI and DKA. She underwent right upper ureter double-J stent placement. Post discharge, patient apparently was doing well without complaints until 08/19/13. After returning home post lithotripsy on 08/19/13, she started having nausea and vomiting which progressively got worse. In the ED, she was found to be in frank DKA with bicarbonate of 5, glucose 388, WBC 18.9 and arterial pH of 7.182. KUB and chest x-ray without acute findings.   Assessment/Plan:  3. DKA in poorly controlled type II DM: Unclear precipitating event-? Nausea and vomiting, UTI. Admitted to step down unit. She was hydrated aggressively, started on insulin drip per DKA protocol and her BMPs were monitored closely. Finally on 08/23/13, her anion gap closed to 15 and will be transitioned to Lantus and SSI. Will monitor CBGs closely to see if she will need mealtime NovoLog. 4. Dehydration with hyponatremia: Secondary to intractable nausea and vomiting. Hyponatremia is also secondary to hyper glycemia. Improved after IV fluid hydration.  5. Hypokalemia: Replaced aggressively. Magnesium normal. 6. Intractable nausea and vomiting: DD-diabetic gastroparesis, UTI, gastritis. No acute findings on KUB. No acute findings on abdominal exam or KUB. She was treated supportively with bowel rest, IV fluids, PPIs and antiemetics. No further nausea vomiting since last night. Resumed diabetic diet as tolerated. 7. Possible UTI:  Treating with Rocephin pending urine culture results. If urine cultures are negative, DC antibiotics. 8. Leukocytosis: May be stress response to DKA versus UTI. Management as above. Resolved. 9. Prolonged QTC: Likely secondary to hypokalemia. Resolved after replacing potassium (EKG: Sinus rhythm without acute changes. QTC 450 ms). 10. Hypothyroidism: Continue Synthroid. 11. Hypertension: Patient's blood pressures are soft. Continue to hold antihypertensives. 12. Status post recent right double-J ureteral stent and lithotripsy for obstructing renal calculus 13. Anemia: May be dilutional. Follow CBC in a.m.    Code Status: Full Family Communication: None at bedside Disposition Plan: Continue treatment in step down unit overnight.   Consultants:  None  Procedures:  None  Antibiotics:  IV Rocephin 2/7 >   Subjective: Feels much better. No nausea or vomiting since last night. Feels stronger. No dyspnea.  Objective: Filed Vitals:   08/23/13 0400 08/23/13 0500 08/23/13 0600 08/23/13 0959  BP: 113/57 110/58 103/83   Pulse: 80 85    Temp: 98 F (36.7 C)   97.8 F (36.6 C)  TempSrc: Oral   Oral  Resp: 24 17 15    Height:      Weight:  65.3 kg (143 lb 15.4 oz)    SpO2: 100% 97%      Intake/Output Summary (Last 24 hours) at 08/23/13 1453 Last data filed at 08/23/13 [id number]  Gross per 24 hour  Intake 1896.63 ml  Output   2200 ml  Net -303.37 ml   Filed Weights   08/22/13 1125 08/22/13 1612 08/23/13 0500  Weight: 64.864 kg (143 lb) 64.2 kg (141 lb 8.6 oz) 65.3 kg (143 lb 15.4 oz)     Exam:  General exam: Young female, looking much improved compared to admission, lying comfortably in bed. Was up with assistance to bedside commode.  Respiratory system: Clear. No increased work of breathing. Cardiovascular system: S1 & S2 heard, RRR. No JVD, murmurs, gallops, clicks or pedal edema. Telemetry: Sinus rhythm. Gastrointestinal system: Abdomen is nondistended, soft and nontender.  Normal bowel sounds heard. Central nervous system: Alert and oriented. No focal neurological deficits. Extremities: Symmetric 5 x 5 power.   Data Reviewed: Basic Metabolic Panel:  Recent Labs Lab 08/22/13 1716  08/23/13 0311 08/23/13 0516 08/23/13 0752 08/23/13 0928 08/23/13 1139  NA  --   < > 139 140 136* 135* 137  K  --   < > 2.9* 3.0* 3.8 3.7 3.6*  CL  --   < > 104 105 103 101 103  CO2  --   < > 19 19 18* 17* 19  GLUCOSE  --   < > 145* 136* 178* 227* 215*  BUN  --   < > 16 15 13 13 11   CREATININE  --   < > 0.64 0.58 0.58 0.56 0.61  CALCIUM  --   < > 8.5 8.3* 8.3* 8.2* 8.6  MG 1.9  --   --   --   --   --   --   < > = values in this interval not displayed. Liver Function Tests:  Recent Labs Lab 08/22/13 1150  AST 11  ALT 8  ALKPHOS 139*  BILITOT 0.2*  PROT 8.5*  ALBUMIN 3.6    Recent Labs Lab 08/22/13 1150  LIPASE 13   No results found for this basename: AMMONIA,  in the last 168 hours CBC:  Recent Labs Lab 08/22/13 1150 08/22/13 1312 08/23/13 0516  WBC 17.3* 18.9* 10.1  NEUTROABS 15.5*  --   --   HGB 12.5 13.5 10.2*  HCT 38.8 41.1 30.8*  MCV 66.2* 66.2* 62.2*  PLT 578* 508* 467*   Cardiac Enzymes:  Recent Labs Lab 08/22/13 1150  TROPONINI <0.30   BNP (last 3 results) No results found for this basename: PROBNP,  in the last 8760 hours CBG:  Recent Labs Lab 08/23/13 0942 08/23/13 1052 08/23/13 1149 08/23/13 1247 08/23/13 1408  GLUCAP 218* 220* 202* 153* 134*    Recent Results (from the past 240 hour(s))  URINE CULTURE     Status: None   Collection Time    08/13/13  4:00 PM      Result Value Range Status   Specimen Description URINE, CATHETERIZED   Final   Special Requests NONE   Final   Culture  Setup Time     Final   Value: 08/14/2013 01:26     Performed at [place]     Final   Value: NO GROWTH     Performed at [facility]   Culture     Final   Value: NO GROWTH     Performed at [facility]   Report Status 08/14/2013 FINAL   Final  URINE CULTURE     Status: None   Collection Time    08/13/13  5:10 PM      Result Value Range Status   Specimen Description URINE, CATHETERIZED RIGHT KIDNEY   Final   Special Requests ROCEPHIN 1gm   Final   Culture  Setup Time     Final   Value: 08/14/2013 01:18     Performed at [place]     Final   Value: NO GROWTH     Performed at [facility]  Culture     Final   Value: NO GROWTH     Performed at [facility]   Report Status 08/14/2013 FINAL   Final      Studies: Dg Abd Acute W/chest  08/22/2013   CLINICAL DATA:  Abdominal pain and hyperglycemia  EXAM: ACUTE ABDOMEN SERIES (ABDOMEN 2 VIEW & CHEST 1 VIEW)  COMPARISON:  Chest radiograph November 04, 2010; abdominal radiograph August 19, 2013  FINDINGS: PA chest: There is no edema or consolidation. Heart is upper normal in size with normal pulmonary vascularity. No adenopathy.  Supine and upright abdomen: There is a double-J stent on the right extending from the level of L1-2 to the bladder. There is moderate stool in the colon. Bowel gas pattern is unremarkable. No obstruction or free air. The previously noted proximal right ureteral calculus is not seen on this study.  IMPRESSION: Double-J stent remains on the right. The previously noted calculus in the proximal right ureteral region is not convincingly seen on this study. The bowel gas pattern is normal. No lung edema or consolidation.   Electronically Signed   By: Lowella Grip M.D.   On: 08/22/2013 13:03        Scheduled Meds: . antiseptic oral rinse  15 mL Mouth Rinse q12n4p  . cefTRIAXone (ROCEPHIN)  IV  1 g Intravenous Q24H  . chlorhexidine  15 mL Mouth Rinse BID  . enoxaparin (LOVENOX) injection  40 mg Subcutaneous Q24H  . insulin aspart  0-5 Units Subcutaneous QHS  . insulin aspart  0-9 Units Subcutaneous TID WC  . insulin glargine  15 Units Subcutaneous QHS  .  levothyroxine  50 mcg Oral QAC breakfast  . pantoprazole (PROTONIX) IV  40 mg Intravenous QHS  . simvastatin  20 mg Oral q1800   Continuous Infusions: . dextrose 5 % and 0.45 % NaCl with KCl 20 mEq/L 150 mL/hr at 08/23/13 1208  . insulin (NOVOLIN-R) infusion 1.5 Units/hr (08/23/13 1409)    Principal Problem:   DKA (diabetic ketoacidosis) Active Problems:   HYPOTHYROIDISM   DM (diabetes mellitus), type 2, uncontrolled   HYPERCHOLESTEROLEMIA   UTI (urinary tract infection)   Leukocytosis, unspecified   Intractable nausea and vomiting   Hypokalemia   Dehydration   Generalized weakness   DKA, type 2    Time spent: 65 minutes    Henley Boettner, MD, FACP, FHM. [facility] Pager [phone]  If 7PM-7AM, please contact night-coverage [email or web address] Password [email or web address] 08/23/2013, 2:53 PM    LOS: 1 day

## 2013-08-24 LAB — BASIC METABOLIC PANEL
BUN: 7 mg/dL (ref 6–23)
CALCIUM: 8.3 mg/dL — AB (ref 8.4–10.5)
CO2: 23 mEq/L (ref 19–32)
Chloride: 102 mEq/L (ref 96–112)
Creatinine, Ser: 0.51 mg/dL (ref 0.50–1.10)
GFR calc non Af Amer: >90 mL/min (ref >90)
Glucose, Bld: 108 mg/dL — ABNORMAL HIGH (ref 70–99)
POTASSIUM: 3.1 meq/L — AB (ref 3.7–5.3)
SODIUM: 140 meq/L (ref 137–147)

## 2013-08-24 LAB — CBC
HCT: 30.5 % — ABNORMAL LOW (ref 36.0–46.0)
HEMOGLOBIN: 10.1 g/dL — AB (ref 12.0–15.0)
MCH: 21.2 pg — ABNORMAL LOW (ref 26.0–34.0)
MCHC: 33.1 g/dL (ref 30.0–36.0)
MCV: 64.1 fL — ABNORMAL LOW (ref 78.0–100.0)
PLATELETS: 341 10*3/uL (ref 150–400)
RBC: 4.76 MIL/uL (ref 3.87–5.11)
RDW: 16.2 % — ABNORMAL HIGH (ref 11.5–15.5)
WBC: 6.7 10*3/uL (ref 4.0–10.5)

## 2013-08-24 LAB — HEMOGLOBIN A1C
HEMOGLOBIN A1C: 8.3 % — AB (ref <5.7)
Mean Plasma Glucose: 192 mg/dL — ABNORMAL HIGH (ref <117)

## 2013-08-24 LAB — GLUCOSE, CAPILLARY
Glucose-Capillary: 115 mg/dL — ABNORMAL HIGH (ref 70–99)
Glucose-Capillary: 129 mg/dL — ABNORMAL HIGH (ref 70–99)
Glucose-Capillary: 296 mg/dL — ABNORMAL HIGH (ref 70–99)

## 2013-08-24 LAB — PREGNANCY, URINE: PREG TEST UR: NEGATIVE

## 2013-08-24 MED ORDER — POTASSIUM CHLORIDE CRYS ER 20 MEQ PO TBCR
40.0000 meq | EXTENDED_RELEASE_TABLET | Freq: Once | ORAL | Status: AC
  Administered 2013-08-24: 40 meq via ORAL
  Filled 2013-08-24: qty 2

## 2013-08-24 MED ORDER — ONDANSETRON HCL 4 MG PO TABS: 4.0000 mg | ORAL_TABLET | Freq: Four times a day (QID) | ORAL | Status: DC | PRN

## 2013-08-24 MED FILL — Midazolam HCl Inj 2 MG/2ML (Base Equivalent): INTRAMUSCULAR | Qty: 2 | Status: AC

## 2013-08-24 MED FILL — Fentanyl Citrate Inj 0.05 MG/ML: INTRAMUSCULAR | Qty: 2 | Status: AC

## 2013-08-24 MED FILL — Iohexol IV Soln 350 MG/ML: INTRAVENOUS | Qty: 50 | Status: AC

## 2013-08-24 MED FILL — Ondansetron HCl Inj 4 MG/2ML (2 MG/ML): INTRAMUSCULAR | Qty: 2 | Status: AC

## 2013-08-24 NOTE — Plan of Care (Signed)
Problem: Phase II Progression Outcomes Goal: Tolerating PO clear liquid diet Outcome: Completed/Met Date Met:  08/24/13 Pt on carb mod at this time.

## 2013-08-24 NOTE — Progress Notes (Signed)
Pt verbalizes understanding of d/c instructions, medications and follow up appts. Pt has no questions at this time. IV was d/c without complications. Pts husband is here to drive her home. Pt was d/c via wheelchair accompanied by NT. Reviewed belongings policy w/pt and encouraged her to check room well to ensure she had all of her belongings. Melissa Schneider

## 2013-08-24 NOTE — Progress Notes (Signed)
UR chart review completed.  

## 2013-08-24 NOTE — Discharge Summary (Signed)
Physician Discharge Summary  Melissa Schneider [id number] DOB: August 01, 1955 DOA: 08/22/2013  PCP: Horatio Pel, MD Outpatient Specialists:  1. Urology: Dr. Beaulah Dinning 2. Endocrinology: Dr. Loni Beckwith  Admit date: 08/22/2013 Discharge date: 08/24/2013  Time spent: Less than 30 minutes  Recommendations for Outpatient Follow-up:  1. Dr. Loni Beckwith, Endocrinology on 08/28/13 at 8:30 AM. Please followup hemoglobin A1c that was sent from the hospital on 08/24/13. 2. Dr. Deland Pretty, PCP on 08/31/2013 at 9:45 AM with repeat labs (CBC & BMP) 3. Dr. Beaulah Dinning, Urology on 08/27/2013 at 3:45 PM  Discharge Diagnoses:  Principal Problem:   DKA (diabetic ketoacidosis) Active Problems:   HYPOTHYROIDISM   DM (diabetes mellitus), type 2, uncontrolled   HYPERCHOLESTEROLEMIA   UTI (urinary tract infection)   Leukocytosis, unspecified   Intractable nausea and vomiting   Hypokalemia   Dehydration   Generalized weakness   DKA, type 2   Discharge Condition: Improved & Stable  Diet recommendation: Heart Healthy & Diabetic Diet.  Filed Weights   08/22/13 1612 08/23/13 0500 08/24/13 0500  Weight: 64.2 kg (141 lb 8.6 oz) 65.3 kg (143 lb 15.4 oz) 67 kg (147 lb 11.3 oz)    History of present illness:  58 y.o. female with history of type II DM diagnosed 3 years ago, 2 prior episodes of DKA, suspected diabetic gastroparesis, hypertension, hypothyroidism, dyslipidemia, hard of hearing out of right ear, recent hospitalization 08/13/13-08/16/13 for obstructing right UPJ calculus with hydronephrosis, UTI and DKA. She underwent right upper ureter double-J stent placement. Post discharge, patient apparently was doing well without complaints until 08/19/13. After returning home post lithotripsy on 08/19/13, she started having nausea and vomiting which progressively got worse. In the ED, she was found to be in frank DKA with bicarbonate of 5, glucose 388, WBC 18.9 and arterial pH of 7.182. KUB  and chest x-ray without acute findings  Hospital Course:   1. DKA in poorly controlled type II DM: Unclear precipitating event-? Nausea and vomiting. Admitted to step down unit. She was hydrated aggressively, started on insulin drip per DKA protocol and her BMPs were monitored closely. Finally on 08/23/13, her anion gap closed to 15 and she was transitioned to Lantus and SSI with reasonable CBG control. He post prandial BF CBG is 296. She will be discharged on prior home dose of Lantus, NovoLog sliding scale insulin and oral hypoglycemics. She is to followup with her primary endocrinologist in the next couple of days for further medication management. No recent A1c-requested and pending. 2. Dehydration with hyponatremia: Secondary to intractable nausea and vomiting. Hyponatremia is also secondary to hyper glycemia. Resolved after IV fluid hydration.  3. Hypokalemia: Magnesium normal. Replaced as needed. Replaced prior to discharge. 4. Intractable nausea and vomiting: ? diabetic gastroparesis. No acute findings on KUB. No acute findings on abdominal exam or KUB. She was treated supportively with bowel rest, IV fluids, PPIs and antiemetics. No further nausea vomiting since last night. Resumed diabetic diet which she has tolerated without any further episodes of nausea or vomiting. Advised frequent small meals and diabetic control. 5. Asymptomatic bacteriuria: She was empirically placed on IV Rocephin pending urine culture results which is negative again (negative x3 since 08/13/13). Discontinued Rocephin. 6. Leukocytosis: May be stress response to DKA. Management as above. Resolved. 7. Prolonged QTC: Likely secondary to hypokalemia. Resolved after replacing potassium (EKG: Sinus rhythm without acute changes. QTC 450 ms). 8. Hypothyroidism: Continue Synthroid. 9. Hypertension: Antihypertensives were held in the hospital secondary to soft blood pressures  but will be resumed at discharge. 10. Status post recent  right double-J ureteral stent and lithotripsy for obstructing renal calculus 11. Anemia: May be dilutional. Stable.   Consultations:  None  Procedures:  None    Discharge Exam:  Complaints:  Denies complaints. Eating frequent small meals. Denies nausea, vomiting, abdominal pain, diarrhea, dizziness, lightheadedness or dyspnea.  Filed Vitals:   08/24/13 0500 08/24/13 0603 08/24/13 0800 08/24/13 1143  BP: 111/68 136/84    Pulse: 80 51    Temp:   98.4 F (36.9 C) 98.2 F (36.8 C)  TempSrc:   Oral Oral  Resp: 20 18    Height:      Weight: 67 kg (147 lb 11.3 oz)     SpO2: 98% 100%      General exam: Young female patient, ambulating in room, comfortable, pleasant. Much improved compared to admission. Respiratory system: Clear. No increased work of breathing. Cardiovascular system: S1 & S2 heard, RRR. No JVD, murmurs, gallops, clicks or pedal edema. Telemetry: Sinus rhythm Gastrointestinal system: Abdomen is nondistended, soft and nontender. Normal bowel sounds heard. Central nervous system: Alert and oriented. No focal neurological deficits. Extremities: Symmetric 5 x 5 power.  Discharge Instructions      Discharge Orders   Future Orders Complete By Expires   Call MD for:  extreme fatigue  As directed    Call MD for:  persistant dizziness or light-headedness  As directed    Call MD for:  persistant nausea and vomiting  As directed    Call MD for:  severe uncontrolled pain  As directed    Call MD for:  temperature >100.4  As directed    Diet - low sodium heart healthy  As directed    Diet Carb Modified  As directed    Increase activity slowly  As directed        Medication List    STOP taking these medications       ciprofloxacin 250 MG tablet  Commonly known as:  CIPRO      TAKE these medications       insulin aspart 100 UNIT/ML injection  Commonly known as:  novoLOG  Inject 0-17 Units into the skin 3 (three) times daily before meals. SLIDING SCALE: FOR  BLOOD SUGAR OF 121-150 TAKE 2 UNITS; FOR  BLOOD SUGAR OF 151-200 TAKE 4 UNITS; FOR BLOOD SUGAR OF 201-250 TAKE 7 UNITS; FOR BLOOD SUGAR OF 251-300 TAKE 9 UNITS; FOR BLOOD SUGAR OF 301-350 TAKE 12 UNITS; FOR BLOOD SUGAR OF 351-400 UNITS, TAKE 15 UNITS; FOR BLOOD SUGAR OF GREATER THAN 400 TAKE 17 UNITS AND CALL YOUR DOCTOR.     insulin glargine 100 UNIT/ML injection  Commonly known as:  LANTUS  Inject 14 Units into the skin at bedtime.     INVOKANA 300 MG Tabs  Generic drug:  Canagliflozin  Take 1 tablet by mouth daily.     levothyroxine 50 MCG tablet  Commonly known as:  SYNTHROID, LEVOTHROID  Take 50 mcg by mouth daily.     losartan 25 MG tablet  Commonly known as:  COZAAR  Take 25 mg by mouth daily.     metFORMIN 1000 MG tablet  Commonly known as:  GLUCOPHAGE  Take 1,000 mg by mouth 2 (two) times daily with a meal.     multivitamin with minerals Tabs tablet  Take 1 tablet by mouth daily.     ondansetron 4 MG tablet  Commonly known as:  ZOFRAN  Take 1 tablet (  4 mg total) by mouth every 6 (six) hours as needed for nausea or vomiting.     pravastatin 40 MG tablet  Commonly known as:  PRAVACHOL  Take 40 mg by mouth daily.     SUMAtriptan 100 MG tablet  Commonly known as:  IMITREX  Take 100 mg by mouth every 2 (two) hours as needed. For migraines       Follow-up Information   Follow up with NIDA,GEBRESELASSIE, MD. Schedule an appointment as soon as possible for a visit on 08/28/2013. ( 8:30am, Diabetes Management)    Specialty:  Endocrinology   Contact information:   [address] [place] [address] [phone]       Follow up with Londell Moh, MD. Schedule an appointment as soon as possible for a visit on 08/31/2013. (9:45am, To be seen with repeat labs (CBC & BMP).)    Specialty:  Internal Medicine   Contact information:   [address] [place] [address] [place] [address] [phone]       Follow up with Ky Barban, MD. Schedule an  appointment as soon as possible for a visit on 08/27/2013. (3:45pm)    Specialty:  Urology   Contact information:   [address] [place] [address] [phone]        The results of significant diagnostics from this hospitalization (including imaging, microbiology, ancillary and laboratory) are listed below for reference.    Significant Diagnostic Studies:  Dg Abd Acute W/chest  08/22/2013   CLINICAL DATA:  Abdominal pain and hyperglycemia  EXAM: ACUTE ABDOMEN SERIES (ABDOMEN 2 VIEW & CHEST 1 VIEW)  COMPARISON:  Chest radiograph November 04, 2010; abdominal radiograph August 19, 2013  FINDINGS: PA chest: There is no edema or consolidation. Heart is upper normal in size with normal pulmonary vascularity. No adenopathy.  Supine and upright abdomen: There is a double-J stent on the right extending from the level of L1-2 to the bladder. There is moderate stool in the colon. Bowel gas pattern is unremarkable. No obstruction or free air. The previously noted proximal right ureteral calculus is not seen on this study.  IMPRESSION: Double-J stent remains on the right. The previously noted calculus in the proximal right ureteral region is not convincingly seen on this study. The bowel gas pattern is normal. No lung edema or consolidation.   Electronically Signed   By: Bretta Bang M.D.   On: 08/22/2013 13:03   Microbiology: Recent Results (from the past 240 hour(s))  URINE CULTURE     Status: None   Collection Time    08/22/13 12:54 PM      Result Value Range Status   Specimen Description URINE, CATHETERIZED   Final   Special Requests NONE   Final   Culture  Setup Time     Final   Value: 08/22/2013 22:04     Performed at [facility]   Colony Count     Final   Value: NO GROWTH     Performed at [facility]   Culture     Final   Value: NO GROWTH     Performed at [facility]   Report Status 08/23/2013 FINAL   Final     Labs: Basic Metabolic  Panel:  Recent Labs Lab 08/22/13 1716  08/23/13 0516 08/23/13 0752 08/23/13 0928 08/23/13 1139 08/24/13 0504  NA  --   < > 140 136* 135* 137 140  K  --   < > 3.0* 3.8 3.7 3.6*  3.1*  CL  --   < > 105 103 101 103 102  CO2  --   < > 19 18* 17* 19 23  GLUCOSE  --   < > 136* 178* 227* 215* 108*  BUN  --   < > 15 13 13 11 7   CREATININE  --   < > 0.58 0.58 0.56 0.61 0.51  CALCIUM  --   < > 8.3* 8.3* 8.2* 8.6 8.3*  MG 1.9  --   --   --   --   --   --   < > = values in this interval not displayed. Liver Function Tests:  Recent Labs Lab 08/22/13 1150  AST 11  ALT 8  ALKPHOS 139*  BILITOT 0.2*  PROT 8.5*  ALBUMIN 3.6    Recent Labs Lab 08/22/13 1150  LIPASE 13   No results found for this basename: AMMONIA,  in the last 168 hours CBC:  Recent Labs Lab 08/22/13 1150 08/22/13 1312 08/23/13 0516 08/24/13 0504  WBC 17.3* 18.9* 10.1 6.7  NEUTROABS 15.5*  --   --   --   HGB 12.5 13.5 10.2* 10.1*  HCT 38.8 41.1 30.8* 30.5*  MCV 66.2* 66.2* 62.2* 64.1*  PLT 578* 508* 467* 341   Cardiac Enzymes:  Recent Labs Lab 08/22/13 1150  TROPONINI <0.30   BNP: BNP (last 3 results) No results found for this basename: PROBNP,  in the last 8760 hours CBG:  Recent Labs Lab 08/23/13 1800 08/23/13 2042 08/24/13 0403 08/24/13 0745 08/24/13 1128  GLUCAP 225* 144* 115* 129* 296*    Additional labs: 1. ABG on admission: PH 7.182, PCO2 13, PO2 119, bicarbonate 4.7 and oxygen saturation 97.5 2. BMP on admission: Sodium 128, potassium 3.5, chloride 80, bicarbonate 5, BUN 31, creatinine 0.99 and glucose 388 3. Urine pregnancy test: Negative  Signed:  Vernell Leep, MD, FACP, FHM. [facility] Pager [phone]  If 7PM-7AM, please contact night-coverage [email or web address] Password [email or web address] 08/24/2013, 2:14 PM

## 2013-08-24 NOTE — Progress Notes (Signed)
Inpatient Diabetes Program Recommendations  AACE/ADA: New Consensus Statement on Inpatient Glycemic Control (2013)  Target Ranges:  Prepandial:   less than 140 mg/dL      Peak postprandial:   less than 180 mg/dL (1-2 hours)      Critically ill patients:  140 - 180 mg/dL   Results for [name], [name] (MRN [id number]) as of 08/24/2013 08:39  Ref. Range 07/20/2011 19:49  Hemoglobin A1C Latest Range: <5.7 % 15.9 (H)  Results for Melissa Schneider, Melissa Schneider (MRN [id number]) as of 08/24/2013 08:39  Ref. Range 08/23/2013 10:52 08/23/2013 11:49 08/23/2013 12:47 08/23/2013 14:08 08/23/2013 15:06 08/23/2013 16:11 08/23/2013 17:03 08/23/2013 18:00 08/23/2013 20:42 08/24/2013 04:03 08/24/2013 07:45  Glucose-Capillary Latest Range: 70-99 mg/dL 220 (H) 202 (H) 153 (H) 134 (H) 134 (H) 157 (H) 168 (H) 225 (H) 144 (H) 115 (H) 129 (H)   Diabetes history: DM2 Outpatient Diabetes medications: Lantus 14 units QHS, Novolog 0-17 units TID with meals, Metformin 1000 mg BID, and Invokana 300 mg daily Current orders for Inpatient glycemic control: Lantus 15 units QHS, Novolog 0-9 units AC, Novolog 0-5 units HS  Inpatient Diabetes Program Recommendations HgbA1C: Please order an A1C to evaluate glycemic control over the past 2-3 months.  Last A1C in the chart was 15.9% on 07/20/2011.  Thanks, Barnie Alderman, RN, MSN, CCRN Diabetes Coordinator Inpatient Diabetes Program [phone] (Team Pager) [phone] ([facility] office) [phone] [facility] office)

## 2013-12-29 ENCOUNTER — Encounter: Payer: Self-pay | Admitting: Pediatrics

## 2014-01-29 ENCOUNTER — Encounter: Payer: Self-pay | Admitting: Pediatrics

## 2014-01-29 ENCOUNTER — Other Ambulatory Visit (HOSPITAL_COMMUNITY)
Admission: RE | Admit: 2014-01-29 | Discharge: 2014-01-29 | Disposition: A | Discharge status: Home / Self Care | Payer: 59 | Source: Ambulatory Visit | Attending: Internal Medicine | Admitting: Internal Medicine

## 2014-01-29 DIAGNOSIS — Z01419 Encounter for gynecological examination (general) (routine) without abnormal findings: Secondary | ICD-10-CM | POA: Insufficient documentation

## 2014-02-05 ENCOUNTER — Encounter: Payer: Self-pay | Admitting: Pediatrics

## 2014-03-09 ENCOUNTER — Encounter: Payer: Self-pay | Admitting: Pediatrics

## 2014-03-12 ENCOUNTER — Encounter: Payer: Self-pay | Admitting: Pediatrics

## 2014-03-25 ENCOUNTER — Encounter: Payer: Self-pay | Admitting: Internal Medicine

## 2014-04-01 ENCOUNTER — Telehealth: Payer: Self-pay

## 2014-04-01 NOTE — Telephone Encounter (Signed)
Pt called and said she has been referred by her PCP for constipation.  She has an appt on 04/28/2014 but said she cannot wait that long, she will be dead before then. She has to use laxatives to have a BM and still has to assist pulling the stool out. She had an X-ray done by PCP and it only showed constipation. She was given suppositories that helped some.  I offer her an urgent at 2:30 PM this afternoon and she said she had an appt for her ear this afternoon. I have scheduled her for 04/05/2014 at 9:30 Am with Neil Crouch, PA and told her if she worsens to go to the ED.

## 2014-04-05 ENCOUNTER — Encounter: Payer: Self-pay | Admitting: Gastroenterology

## 2014-04-05 ENCOUNTER — Ambulatory Visit (INDEPENDENT_AMBULATORY_CARE_PROVIDER_SITE_OTHER): Payer: 59 | Admitting: Gastroenterology

## 2014-04-05 VITALS — BP 124/72 | HR 77 | Temp 97.7°F | Ht 62.0 in | Wt 164.8 lb

## 2014-04-05 DIAGNOSIS — D509 Iron deficiency anemia, unspecified: Secondary | ICD-10-CM | POA: Insufficient documentation

## 2014-04-05 DIAGNOSIS — K5900 Constipation, unspecified: Secondary | ICD-10-CM

## 2014-04-05 LAB — CBC WITH DIFFERENTIAL/PLATELET
BASOS ABS: 0.1 10*3/uL (ref 0.0–0.1)
Basophils Relative: 1 % (ref 0–1)
EOS ABS: 0.2 10*3/uL (ref 0.0–0.7)
Eosinophils Relative: 3 % (ref 0–5)
HCT: 40.6 % (ref 36.0–46.0)
HEMOGLOBIN: 12.9 g/dL (ref 12.0–15.0)
Lymphocytes Relative: 33 % (ref 12–46)
Lymphs Abs: 1.9 10*3/uL (ref 0.7–4.0)
MCH: 21.5 pg — ABNORMAL LOW (ref 26.0–34.0)
MCHC: 31.8 g/dL (ref 30.0–36.0)
MCV: 67.6 fL — ABNORMAL LOW (ref 78.0–100.0)
MONOS PCT: 7 % (ref 3–12)
Monocytes Absolute: 0.4 10*3/uL (ref 0.1–1.0)
NEUTROS ABS: 3.3 10*3/uL (ref 1.7–7.7)
NEUTROS PCT: 56 % (ref 43–77)
PLATELETS: 277 10*3/uL (ref 150–400)
RBC: 6.01 MIL/uL — ABNORMAL HIGH (ref 3.87–5.11)
RDW: 15.7 % — ABNORMAL HIGH (ref 11.5–15.5)
WBC: 5.9 10*3/uL (ref 4.0–10.5)

## 2014-04-05 LAB — IRON AND TIBC
%SAT: 25 % (ref 20–55)
Iron: 90 ug/dL (ref 42–145)
TIBC: 365 ug/dL (ref 250–470)
UIBC: 275 ug/dL (ref 125–400)

## 2014-04-05 MED ORDER — LINACLOTIDE 145 MCG PO CAPS: 145.0000 ug | ORAL_CAPSULE | Freq: Every day | ORAL | Status: DC

## 2014-04-05 NOTE — Assessment & Plan Note (Signed)
Check for iron deficiency and hemoglobinopathy. She reports recent heme negative stool.

## 2014-04-05 NOTE — Progress Notes (Signed)
Primary Care Physician:  Horatio Pel, MD  Primary Gastroenterologist:  Garfield Cornea, MD   Chief Complaint  Patient presents with  . Constipation    HPI:  Melissa Schneider is a 58 y.o. female here for further evaluation change in bowel habits, constipation. She was referred by Dr. Deland Pretty.   Patient reports her baseline bowel function usually consist of daily to every other day bowel movement with use of MiraLax as needed. 6 weeks ago she started having difficulty with BMs. Developed abdominal swelling and discomfort. Started over-the-counter stool softeners with laxatives, milk of magnesia without much help. Current bowel regimen consist of 1 capful milk of magnesia, 1 tablespoon mineral, 2 Dulcolax daily. She did not use anything over the weekend, last bowel movement was Friday. No melena, brbpr. PCP, heme negative. Colonoscopy couple of years ago by Dr. Paulita Fujita, normal. No heartburn, vomiting. Occasional short-lived nausea. No LMP in 5 years. Questionable history of thalassemia minor but patient states she never really had any testing done.  Current Outpatient Prescriptions  Medication Sig Dispense Refill  . bisacodyl (DULCOLAX) 10 MG suppository Place 10 mg rectally daily as needed for moderate constipation.      . bisacodyl (DULCOLAX) 5 MG EC tablet Take 5 mg by mouth daily as needed for moderate constipation.      . Canagliflozin (INVOKANA) 300 MG TABS Take 1 tablet by mouth daily.      Marland Kitchen docusate sodium (COLACE) 100 MG capsule Take 200 mg by mouth daily as needed for mild constipation.      . gabapentin (NEURONTIN) 300 MG capsule Take 300 mg by mouth 3 (three) times daily.      . insulin aspart (NOVOLOG) 100 UNIT/ML injection Inject 0-17 Units into the skin 3 (three) times daily before meals. SLIDING SCALE: FOR BLOOD SUGAR OF 121-150 TAKE 2 UNITS; FOR  BLOOD SUGAR OF 151-200 TAKE 4 UNITS; FOR BLOOD SUGAR OF 201-250 TAKE 7 UNITS; FOR BLOOD SUGAR OF 251-300 TAKE 9 UNITS; FOR  BLOOD SUGAR OF 301-350 TAKE 12 UNITS; FOR BLOOD SUGAR OF 351-400 UNITS, TAKE 15 UNITS; FOR BLOOD SUGAR OF GREATER THAN 400 TAKE 17 UNITS AND CALL YOUR DOCTOR.  1 vial  6  . insulin glargine (LANTUS) 100 UNIT/ML injection Inject 14 Units into the skin at bedtime.       Marland Kitchen levothyroxine (SYNTHROID, LEVOTHROID) 75 MCG tablet Take 75 mcg by mouth daily before breakfast.      . losartan (COZAAR) 25 MG tablet Take 25 mg by mouth daily.        . magnesium hydroxide (MILK OF MAGNESIA) 800 MG/5ML suspension Take by mouth daily as needed for constipation.      . metFORMIN (GLUCOPHAGE) 1000 MG tablet Take 1,000 mg by mouth 2 (two) times daily with a meal.      . mineral oil liquid Take 15 mLs by mouth daily as needed for moderate constipation.      . Multiple Vitamin (MULTIVITAMIN WITH MINERALS) TABS tablet Take 1 tablet by mouth daily.      . ondansetron (ZOFRAN) 4 MG tablet Take 1 tablet (4 mg total) by mouth every 6 (six) hours as needed for nausea or vomiting.      . pravastatin (PRAVACHOL) 40 MG tablet Take 40 mg by mouth daily.        . SUMAtriptan (IMITREX) 100 MG tablet Take 100 mg by mouth every 2 (two) hours as needed. For migraines       No current facility-administered  medications for this visit.    Allergies as of 04/05/2014 - Review Complete 04/05/2014  Allergen Reaction Noted  . Codeine Nausea Only and Other (See Comments) 04/05/2014  . Lisinopril  08/24/2010    Past Medical History  Diagnosis Date  . Diabetes mellitus   . Hearing difficulty   . Diabetic gastroparesis 07/23/2011    Suspected  . Anemia 07/23/2011  . Microcytic anemia 07/23/2011  . Headache(784.0)   . Hypothyroidism   . Hyperlipidemia   . Kidney stones   . Anxiety   . Thalassemia minor     ????? not really worked up per pt but daughter has it???    Past Surgical History  Procedure Laterality Date  . Wrist surgery Right   . Cystoscopy w/ ureteral stent placement Right 08/13/2013    Procedure: CYSTOSCOPY WITH  RETROGRADE PYELOGRAM/URETERAL STENT PLACEMENT;  Surgeon: Marissa Nestle, MD;  Location: [facility];  Service: Urology;  Laterality: Right;  . Extracorporeal shock wave lithotripsy Right 08/19/2013    Procedure: EXTRACORPOREAL SHOCK WAVE LITHOTRIPSY (ESWL) RIGHT URETERAL CALCULUS;  Surgeon: Marissa Nestle, MD;  Location: [facility];  Service: Urology;  Laterality: Right;  . Colonoscopy  2004    Dr. Jim Desanctis: small fissue and hemorrhoids  . Colonoscopy      Dr. Paulita Fujita    Family History  Problem Relation Age of Onset  . Colon cancer Maternal Grandmother     age 57  . Colon polyps Mother     History   Social History  . Marital Status: Married    Spouse Name: N/A    Number of Children: 1  . Years of Education: N/A   Occupational History  . transcriptionist    Social History Main Topics  . Smoking status: Never Smoker   . Smokeless tobacco: Not on file     Comment: Never smoked  . Alcohol Use: No  . Drug Use: No  . Sexual Activity: Not on file   Other Topics Concern  . Not on file   Social History Narrative  . No narrative on file      ROS:  General: Negative for anorexia, weight loss, fever, chills, fatigue, weakness. Eyes: Negative for vision changes.  ENT: Negative for hoarseness, difficulty swallowing , nasal congestion. Hearing loss, chronic CV: Negative for chest pain, angina, palpitations, dyspnea on exertion, peripheral edema.  Respiratory: Negative for dyspnea at rest, dyspnea on exertion, cough, sputum, wheezing.  GI: See history of present illness. GU:  Negative for dysuria, hematuria, urinary incontinence, urinary frequency, nocturnal urination.  MS: Negative for joint pain, low back pain.  Derm: Negative for rash or itching.  Neuro: Negative for weakness, abnormal sensation, seizure, frequent headaches, memory loss, confusion.  Psych: Negative for anxiety, depression, suicidal ideation, hallucinations.  Endo: Negative for unusual weight change.  Heme:  Negative for bruising or bleeding. Allergy: Negative for rash or hives.    Physical Examination:  BP 124/72  Pulse 77  Temp(Src) 97.7 F (36.5 C) (Oral)  Ht 5\' 2"  (1.575 m)  Wt 164 lb 12.8 oz (74.753 kg)  BMI 30.13 kg/m2   General: Well-nourished, well-developed in no acute distress.  Head: Normocephalic, atraumatic.   Eyes: Conjunctiva pink, no icterus. Mouth: Oropharyngeal mucosa moist and pink , no lesions erythema or exudate. Neck: Supple without thyromegaly, masses, or lymphadenopathy.  Lungs: Clear to auscultation bilaterally.  Heart: Regular rate and rhythm, no murmurs rubs or gallops.  Abdomen: Bowel sounds are normal, nontender, nondistended, no hepatosplenomegaly or masses,  no abdominal bruits or    hernia , no rebound or guarding.   Rectal: Not performed Extremities: No lower extremity edema. No clubbing or deformities.  Neuro: Alert and oriented x 4 , grossly normal neurologically.  Skin: Warm and dry, no rash or jaundice.   Psych: Alert and cooperative, normal mood and affect.  Labs: Lab Results  Component Value Date   CREATININE 0.51 08/24/2013   BUN 7 08/24/2013   NA 140 08/24/2013   K 3.1* 08/24/2013   CL 102 08/24/2013   CO2 23 08/24/2013   Lab Results  Component Value Date   ALT 8 08/22/2013   AST 11 08/22/2013   ALKPHOS 139* 08/22/2013   BILITOT 0.2* 08/22/2013   Lab Results  Component Value Date   LIPASE 13 08/22/2013   Lab Results  Component Value Date   WBC 6.7 08/24/2013   HGB 10.1* 08/24/2013   HCT 30.5* 08/24/2013   MCV 64.1* 08/24/2013   PLT 341 08/24/2013   No results found for this basename: IRON, TIBC, FERRITIN    Labs from August 2015 hemoglobin 12.6, hematocrit 38.3, MCV 66.3, TSH 2.860.  Imaging Studies: Abdominal film dated 03/12/2014, moderate amount of stool burden noted.

## 2014-04-05 NOTE — Patient Instructions (Signed)
1. Please have your labs done. 2. Start Linzess one daily on empty stomach for constipation. Stop other laxatives. 3. Increase water consumption to 80 ounces per day. 4. Increase daily fiber consumption. 5. I will review copy of last colonoscopy report from Dr. Paulita Fujita.  Constipation Constipation is when a person has fewer than three bowel movements a week, has difficulty having a bowel movement, or has stools that are dry, hard, or larger than normal. As people grow older, constipation is more common. If you try to fix constipation with medicines that make you have a bowel movement (laxatives), the problem may get worse. Long-term laxative use may cause the muscles of the colon to become weak. A low-fiber diet, not taking in enough fluids, and taking certain medicines may make constipation worse.  CAUSES   Certain medicines, such as antidepressants, pain medicine, iron supplements, antacids, and water pills.   Certain diseases, such as diabetes, irritable bowel syndrome (IBS), thyroid disease, or depression.   Not drinking enough water.   Not eating enough fiber-rich foods.   Stress or travel.   Lack of physical activity or exercise.   Ignoring the urge to have a bowel movement.   Using laxatives too much.  SIGNS AND SYMPTOMS   Having fewer than three bowel movements a week.   Straining to have a bowel movement.   Having stools that are hard, dry, or larger than normal.   Feeling full or bloated.   Pain in the lower abdomen.   Not feeling relief after having a bowel movement.  DIAGNOSIS  Your health care provider will take a medical history and perform a physical exam. Further testing may be done for severe constipation. Some tests may include:  A barium enema X-ray to examine your rectum, colon, and, sometimes, your small intestine.   A sigmoidoscopy to examine your lower colon.   A colonoscopy to examine your entire colon. TREATMENT  Treatment will  depend on the severity of your constipation and what is causing it. Some dietary treatments include drinking more fluids and eating more fiber-rich foods. Lifestyle treatments may include regular exercise. If these diet and lifestyle recommendations do not help, your health care provider may recommend taking over-the-counter laxative medicines to help you have bowel movements. Prescription medicines may be prescribed if over-the-counter medicines do not work.  HOME CARE INSTRUCTIONS   Eat foods that have a lot of fiber, such as fruits, vegetables, whole grains, and beans.  Limit foods high in fat and processed sugars, such as french fries, hamburgers, cookies, candies, and soda.   A fiber supplement may be added to your diet if you cannot get enough fiber from foods.   Drink enough fluids to keep your urine clear or pale yellow.   Exercise regularly or as directed by your health care provider.   Go to the restroom when you have the urge to go. Do not hold it.   Only take over-the-counter or prescription medicines as directed by your health care provider. Do not take other medicines for constipation without talking to your health care provider first.  [place] IF:   You have bright red blood in your stool.   Your constipation lasts for more than 4 days or gets worse.   You have abdominal or rectal pain.   You have thin, pencil-like stools.   You have unexplained weight loss. MAKE SURE YOU:   Understand these instructions.  Will watch your condition.  Will get help right away  if you are not doing well or get worse. Document Released: 03/30/2004 Document Revised: 07/07/2013 Document Reviewed: 04/13/2013 [facility] Patient Information 2015 [place], [place]. This information is not intended to replace advice given to you by your health care provider. Make sure you discuss any questions you have with your health care provider.  High-Fiber Diet Fiber is found  in fruits, vegetables, and grains. A high-fiber diet encourages the addition of more whole grains, legumes, fruits, and vegetables in your diet. The recommended amount of fiber for adult males is 38 g per day. For adult females, it is 25 g per day. Pregnant and lactating women should get 28 g of fiber per day. If you have a digestive or bowel problem, ask your caregiver for advice before adding high-fiber foods to your diet. Eat a variety of high-fiber foods instead of only a select few type of foods.  PURPOSE  To increase stool bulk.  To make bowel movements more regular to prevent constipation.  To lower cholesterol.  To prevent overeating. WHEN IS THIS DIET USED?  It may be used if you have constipation and hemorrhoids.  It may be used if you have uncomplicated diverticulosis (intestine condition) and irritable bowel syndrome.  It may be used if you need help with weight management.  It may be used if you want to add it to your diet as a protective measure against atherosclerosis, diabetes, and cancer. SOURCES OF FIBER  Whole-grain breads and cereals.  Fruits, such as apples, oranges, bananas, berries, prunes, and pears.  Vegetables, such as green peas, carrots, sweet potatoes, beets, broccoli, cabbage, spinach, and artichokes.  Legumes, such split peas, soy, lentils.  Almonds. FIBER CONTENT IN FOODS Starches and Grains / Dietary Fiber (g)  Cheerios, 1 cup / 3 g  Corn Flakes cereal, 1 cup / 0.7 g  Rice crispy treat cereal, 1 cup / 0.3 g  Instant oatmeal (cooked),  cup / 2 g  Frosted wheat cereal, 1 cup / 5.1 g  Brown, long-grain rice (cooked), 1 cup / 3.5 g  White, long-grain rice (cooked), 1 cup / 0.6 g  Enriched macaroni (cooked), 1 cup / 2.5 g Legumes / Dietary Fiber (g)  Baked beans (canned, plain, or vegetarian),  cup / 5.2 g  Kidney beans (canned),  cup / 6.8 g  Pinto beans (cooked),  cup / 5.5 g Breads and Crackers / Dietary Fiber (g)  Plain or  honey graham crackers, 2 squares / 0.7 g  Saltine crackers, 3 squares / 0.3 g  Plain, salted pretzels, 10 pieces / 1.8 g  Whole-wheat bread, 1 slice / 1.9 g  White bread, 1 slice / 0.7 g  Raisin bread, 1 slice / 1.2 g  Plain bagel, 3 oz / 2 g  Flour tortilla, 1 oz / 0.9 g  Corn tortilla, 1 small / 1.5 g  Hamburger or hotdog bun, 1 small / 0.9 g Fruits / Dietary Fiber (g)  Apple with skin, 1 medium / 4.4 g  Sweetened applesauce,  cup / 1.5 g  Banana,  medium / 1.5 g  Grapes, 10 grapes / 0.4 g  Orange, 1 small / 2.3 g  Raisin, 1.5 oz / 1.6 g  Melon, 1 cup / 1.4 g Vegetables / Dietary Fiber (g)  Green beans (canned),  cup / 1.3 g  Carrots (cooked),  cup / 2.3 g  Broccoli (cooked),  cup / 2.8 g  Peas (cooked),  cup / 4.4 g  Mashed potatoes,  cup /  1.6 g  Lettuce, 1 cup / 0.5 g  Corn (canned),  cup / 1.6 g  Tomato,  cup / 1.1 g Document Released: 07/02/2005 Document Revised: 01/01/2012 Document Reviewed: 10/04/2011 [facility] Patient Information 2015 [place], [place]. This information is not intended to replace advice given to you by your health care provider. Make sure you discuss any questions you have with your health care provider.

## 2014-04-05 NOTE — Assessment & Plan Note (Addendum)
Tendency towards chronic constipation but well managed previously with MiraLax. Six-week history of more difficult to manage constipation. She reports colonoscopy within last couple of years to be unremarkable. We have requested those records for review. Trial of Linzess 124mcg daily. Increased water consumption and dietary fiber intake. Recent unremarkable calcium and TSH levels.

## 2014-04-06 LAB — FERRITIN: Ferritin: 8 ng/mL — ABNORMAL LOW (ref 10–291)

## 2014-04-06 NOTE — Progress Notes (Signed)
cc'ed to pcp °

## 2014-04-08 LAB — HEMOGLOBINOPATHY EVALUATION
HGB A: 97.6 % (ref 96.8–97.8)
Hemoglobin Other: 0 %
Hgb A2 Quant: 2.4 % (ref 2.2–3.2)
Hgb F Quant: 0 % (ref 0.0–2.0)
Hgb S Quant: 0 %

## 2014-04-21 NOTE — Progress Notes (Signed)
Quick Note:  Hgb remains normal, ferritin (iron stores low). In this setting, testing inconclusive for thalassemia. I am waiting on records for review from Dr. Paulita Fujita. Please ask patient to complete ifobt. ______

## 2014-04-28 ENCOUNTER — Ambulatory Visit: Payer: 59 | Admitting: Gastroenterology

## 2014-05-14 ENCOUNTER — Ambulatory Visit (INDEPENDENT_AMBULATORY_CARE_PROVIDER_SITE_OTHER): Payer: 59 | Admitting: *Deleted

## 2014-05-14 ENCOUNTER — Encounter: Payer: Self-pay | Admitting: Gastroenterology

## 2014-05-14 DIAGNOSIS — Z129 Encounter for screening for malignant neoplasm, site unspecified: Secondary | ICD-10-CM

## 2014-05-14 LAB — IFOBT (OCCULT BLOOD): IFOBT: NEGATIVE

## 2014-05-14 NOTE — Progress Notes (Signed)
Reviewed records.  Colonoscopy with Dr. Paulita Fujita in 12/2011, normal. Planned for 5 year follow for FH of colon polyps.

## 2014-05-14 NOTE — Progress Notes (Signed)
Patient ID: Melissa Schneider, female   DOB: 06-11-1956, 58 y.o.   MRN: [id number]   IFOBT: (-) Negative

## 2014-05-17 ENCOUNTER — Encounter: Payer: Self-pay | Admitting: Gastroenterology

## 2014-05-17 NOTE — Progress Notes (Signed)
Quick Note:  Please let patient know her ifobt was negative.   I discussed her current lab results with Robynn Pane, PA-C with hematology and he thinks she likely has alpha Thalassemia. He recommended some genetic testing and treatment with iron and folic acid.  Since her last colonoscopy was in 2013 and ifobt negative, another colonoscopy not really recommended at this time.  I would like for her to have a consultation with hematology to confirm Alpha Thalassemia. They would manage her iron deficiency. If she did not respond to treatment and remained iron deficient, then we could consider further GI work up.  Let's get her back in to see [place] in 3 months. ______

## 2014-05-17 NOTE — Progress Notes (Signed)
Quick Note:  . ______ 

## 2014-05-27 NOTE — Progress Notes (Signed)
ON RECALL LIST  °

## 2014-06-08 NOTE — Progress Notes (Signed)
Quick Note:  If she doesn't want to see hematologist, we can offer their recommendations which are as follows:  1. Alpha Thalassemia gene study (lab test)  2. Niferex or Poly-iron 1 tablet daily. If does not respond or tolerate then IV iron may be needed. 3. Recommend Folic Acid 1 mg daily (can give RX for #30, 3 RF). 4. Repeat CBC, ferritin, iron/tibc, folate in 4 months. 5. NIC for TCS 12/2016.  ______

## 2014-06-09 ENCOUNTER — Other Ambulatory Visit: Payer: Self-pay

## 2014-06-09 DIAGNOSIS — D509 Iron deficiency anemia, unspecified: Secondary | ICD-10-CM

## 2014-06-09 NOTE — Progress Notes (Signed)
Quick Note:  LMOM to call. ______ 

## 2014-06-09 NOTE — Progress Notes (Signed)
Quick Note:  Pt returned call and was informed. She does not want me to call in Rx today for the folic acid. Said she is not sure that she wants to take iron because it constipates her and she has just got that problem better.  Unsure of having the lab done.  Said she is having a lot of bloating. She is having good BM's now and drinking a lot of water. She is most concerned about bloating at this time. Will call back Mon. ______

## 2014-06-16 ENCOUNTER — Encounter: Payer: Self-pay | Admitting: Gastroenterology

## 2014-06-18 ENCOUNTER — Telehealth: Payer: Self-pay | Admitting: Gastroenterology

## 2014-06-18 ENCOUNTER — Encounter: Payer: Self-pay | Admitting: Internal Medicine

## 2014-06-18 NOTE — Telephone Encounter (Signed)
ON RECALL LIST, MADE APPOINTMENT, LETTER SENT

## 2014-06-18 NOTE — Telephone Encounter (Signed)
Patient needs follow up OV in 3 months.

## 2014-06-18 NOTE — Progress Notes (Signed)
Quick Note:  See separate patient email. ______

## 2014-09-06 ENCOUNTER — Other Ambulatory Visit: Payer: Self-pay

## 2014-09-06 DIAGNOSIS — D509 Iron deficiency anemia, unspecified: Secondary | ICD-10-CM

## 2014-09-17 ENCOUNTER — Ambulatory Visit: Payer: 59 | Admitting: Gastroenterology

## 2014-11-09 ENCOUNTER — Other Ambulatory Visit (HOSPITAL_COMMUNITY)
Admission: RE | Admit: 2014-11-09 | Discharge: 2014-11-09 | Disposition: A | Discharge status: Home / Self Care | Payer: 59 | Source: Ambulatory Visit | Attending: Gastroenterology | Admitting: Gastroenterology

## 2014-11-09 ENCOUNTER — Other Ambulatory Visit (HOSPITAL_COMMUNITY)
Admission: RE | Admit: 2014-11-09 | Discharge: 2014-11-09 | Disposition: A | Discharge status: Home / Self Care | Payer: 59 | Source: Ambulatory Visit | Attending: Orthopedic Surgery | Admitting: Orthopedic Surgery

## 2014-11-09 DIAGNOSIS — Z794 Long term (current) use of insulin: Secondary | ICD-10-CM | POA: Insufficient documentation

## 2014-11-09 DIAGNOSIS — D509 Iron deficiency anemia, unspecified: Secondary | ICD-10-CM | POA: Diagnosis not present

## 2014-11-09 DIAGNOSIS — E1165 Type 2 diabetes mellitus with hyperglycemia: Secondary | ICD-10-CM | POA: Diagnosis not present

## 2014-11-09 LAB — CBC WITH DIFFERENTIAL/PLATELET
BASOS ABS: 0 10*3/uL (ref 0.0–0.1)
Basophils Relative: 0 % (ref 0–1)
EOS ABS: 0.1 10*3/uL (ref 0.0–0.7)
Eosinophils Relative: 2 % (ref 0–5)
HCT: 39.7 % (ref 36.0–46.0)
Hemoglobin: 12.7 g/dL (ref 12.0–15.0)
LYMPHS ABS: 2.1 10*3/uL (ref 0.7–4.0)
LYMPHS PCT: 33 % (ref 12–46)
MCH: 21.6 pg — AB (ref 26.0–34.0)
MCHC: 32 g/dL (ref 30.0–36.0)
MCV: 67.6 fL — ABNORMAL LOW (ref 78.0–100.0)
Monocytes Absolute: 0.5 10*3/uL (ref 0.1–1.0)
Monocytes Relative: 8 % (ref 3–12)
Neutro Abs: 3.7 10*3/uL (ref 1.7–7.7)
Neutrophils Relative %: 58 % (ref 43–77)
Platelets: 261 10*3/uL (ref 150–400)
RBC: 5.87 MIL/uL — ABNORMAL HIGH (ref 3.87–5.11)
RDW: 15.4 % (ref 11.5–15.5)
WBC: 6.5 10*3/uL (ref 4.0–10.5)

## 2014-11-09 LAB — COMPREHENSIVE METABOLIC PANEL
ALBUMIN: 4.3 g/dL (ref 3.5–5.2)
ALT: 19 U/L (ref 0–35)
AST: 18 U/L (ref 0–37)
Alkaline Phosphatase: 97 U/L (ref 39–117)
Anion gap: 9 (ref 5–15)
BUN: 17 mg/dL (ref 6–23)
CO2: 29 mmol/L (ref 19–32)
Calcium: 10 mg/dL (ref 8.4–10.5)
Chloride: 102 mmol/L (ref 96–112)
Creatinine, Ser: 0.73 mg/dL (ref 0.50–1.10)
GFR calc Af Amer: >90 mL/min (ref >90)
GFR calc non Af Amer: >90 mL/min (ref >90)
Glucose, Bld: 104 mg/dL — ABNORMAL HIGH (ref 70–99)
POTASSIUM: 4.3 mmol/L (ref 3.5–5.1)
SODIUM: 140 mmol/L (ref 135–145)
Total Bilirubin: 0.7 mg/dL (ref 0.3–1.2)
Total Protein: 7.2 g/dL (ref 6.0–8.3)

## 2014-11-09 LAB — FERRITIN: Ferritin: 24 ng/mL (ref 10–291)

## 2014-11-09 LAB — IRON AND TIBC
Iron: 157 ug/dL — ABNORMAL HIGH (ref 42–145)
Saturation Ratios: 45 % (ref 20–55)
TIBC: 348 ug/dL (ref 250–470)
UIBC: 191 ug/dL (ref 125–400)

## 2014-11-10 LAB — FOLATE RBC
FOLATE, HEMOLYSATE: 437.5 ng/mL
Folate, RBC: 1099 ng/mL (ref >498)
Hematocrit: 39.8 % (ref 34.0–46.6)

## 2014-11-10 LAB — HEMOGLOBIN A1C
Hgb A1c MFr Bld: 8.8 % — ABNORMAL HIGH (ref 4.8–5.6)
MEAN PLASMA GLUCOSE: 206 mg/dL

## 2014-11-23 NOTE — Progress Notes (Signed)
Quick Note:  Labs stable. Ferritin slightly improved. Is she taking iron? Did she want me to run Alpha Thalassemia gene study (lab test) or see the hematologist? She is overdue for OV.  ______

## 2014-11-24 NOTE — Progress Notes (Signed)
Quick Note:  Pt was informed of results. Said she tried taking iron again and it cramps her so bad she has to go to bed. She has been eating a spinach salad everyday. She is not sure what she wants to do yet as far as the hematologist referral or the ALpha Thalassemia gene study test. She scheduled and OV with LL on 12/07/2014 at 8:00 Am to discuss. ______

## 2014-12-07 ENCOUNTER — Encounter: Payer: Self-pay | Admitting: Gastroenterology

## 2014-12-07 ENCOUNTER — Other Ambulatory Visit: Payer: Self-pay

## 2014-12-07 ENCOUNTER — Ambulatory Visit (INDEPENDENT_AMBULATORY_CARE_PROVIDER_SITE_OTHER): Payer: 59 | Admitting: Gastroenterology

## 2014-12-07 VITALS — BP 122/76 | HR 77 | Temp 97.5°F | Ht 68.0 in | Wt 174.8 lb

## 2014-12-07 DIAGNOSIS — D509 Iron deficiency anemia, unspecified: Secondary | ICD-10-CM

## 2014-12-07 DIAGNOSIS — K5900 Constipation, unspecified: Secondary | ICD-10-CM

## 2014-12-07 MED ORDER — LINACLOTIDE 290 MCG PO CAPS: 290.0000 ug | ORAL_CAPSULE | Freq: Every day | ORAL | Status: DC

## 2014-12-07 NOTE — Progress Notes (Signed)
Primary Care Physician: Horatio Pel, MD  Primary Gastroenterologist:  Garfield Cornea, MD   Chief Complaint  Patient presents with  . Follow-up    HPI: Melissa Schneider is a 59 y.o. female here for follow-up. She was seen back in September 2015 for change in bowel habits, significant constipation, referred by her PCP.  Colonoscopy by Dr. Paulita Fujita was normal, 2013. She was noted to have significant microcytosis, low ferritin, normal iron studies, normal hemoglobin, hemoglobinopathy evaluation performed. Discussed with Robynn Pane, PA-C with hematology. Patient suspected to have alpha thalassemia but last year she declined hematology evaluation or further workup to confirm. Advised to take oral iron and folic acid she's had difficulty tolerating this.  Patient states that she has tried couple of different over-the-counter iron supplements but develops cramping within a week. She therefore is been increasing dietary iron. Consuming spinach salad every day for lunch. Takes a multivitamin daily. No separate folic acid. Currently having a bowel movement almost daily but very small amount and still has to strain at times. Colace two at night, peppermint tea at night. Has not used mineral oil, milk of magnesia, Dulcolax recently. Linzess 145 g daily was not effective. She was on it for several weeks and really noted no improvement. Metformin stopped by Dr. Dorris Fetch and increased Lantus to 20 units at nighttime. Patient currently denies any abdominal pain. No heartburn. No vomiting. No melanoma rectal bleeding. She is under enormous amount of stress. Taking care of both parents, working full-time job. Weight is up 10 pounds since her last visit.  Current Outpatient Prescriptions  Medication Sig Dispense Refill  . bisacodyl (DULCOLAX) 10 MG suppository Place 10 mg rectally daily as needed for moderate constipation.    . bisacodyl (DULCOLAX) 5 MG EC tablet Take 5 mg by mouth daily as needed  for moderate constipation.    . Canagliflozin (INVOKANA) 300 MG TABS Take 1 tablet by mouth daily.    Marland Kitchen docusate sodium (COLACE) 100 MG capsule Take 200 mg by mouth at bedtime.    . gabapentin (NEURONTIN) 300 MG capsule Take 300 mg by mouth 3 (three) times daily.    . insulin aspart (NOVOLOG) 100 UNIT/ML injection Inject 0-17 Units into the skin 3 (three) times daily before meals. SLIDING SCALE: FOR BLOOD SUGAR OF 121-150 TAKE 2 UNITS; FOR  BLOOD SUGAR OF 151-200 TAKE 4 UNITS; FOR BLOOD SUGAR OF 201-250 TAKE 7 UNITS; FOR BLOOD SUGAR OF 251-300 TAKE 9 UNITS; FOR BLOOD SUGAR OF 301-350 TAKE 12 UNITS; FOR BLOOD SUGAR OF 351-400 UNITS, TAKE 15 UNITS; FOR BLOOD SUGAR OF GREATER THAN 400 TAKE 17 UNITS AND CALL YOUR DOCTOR. 1 vial 6  . insulin glargine (LANTUS) 100 UNIT/ML injection Inject 20 Units into the skin at bedtime.     Marland Kitchen levothyroxine (SYNTHROID, LEVOTHROID) 75 MCG tablet Take 75 mcg by mouth daily before breakfast.    . losartan (COZAAR) 25 MG tablet Take 25 mg by mouth daily.      . magnesium hydroxide (MILK OF MAGNESIA) 800 MG/5ML suspension Take by mouth daily as needed for constipation.    . metFORMIN (GLUCOPHAGE) 1000 MG tablet Take 1,000 mg by mouth 2 (two) times daily with a meal.    . mineral oil liquid Take 15 mLs by mouth daily as needed for moderate constipation.    . Multiple Vitamin (MULTIVITAMIN WITH MINERALS) TABS tablet Take 1 tablet by mouth daily.    . ondansetron (ZOFRAN) 4 MG tablet Take 1 tablet (  4 mg total) by mouth every 6 (six) hours as needed for nausea or vomiting.    . pravastatin (PRAVACHOL) 40 MG tablet Take 40 mg by mouth daily.      . SUMAtriptan (IMITREX) 100 MG tablet Take 100 mg by mouth every 2 (two) hours as needed. For migraines    .       No current facility-administered medications for this visit.    Allergies as of 12/07/2014 - Review Complete 12/07/2014  Allergen Reaction Noted  . Codeine Nausea Only and Other (See Comments) 04/05/2014  . Lisinopril   08/24/2010    ROS:  General: Negative for anorexia, weight loss, fever, chills, fatigue, weakness. ENT: Negative for hoarseness, difficulty swallowing , nasal congestion. CV: Negative for chest pain, angina, palpitations, dyspnea on exertion, peripheral edema.  Respiratory: Negative for dyspnea at rest, dyspnea on exertion, cough, sputum, wheezing.  GI: See history of present illness. GU:  Negative for dysuria, hematuria, urinary incontinence, urinary frequency, nocturnal urination.  Endo: Negative for unusual weight change.    Physical Examination:   BP 122/76 mmHg  Pulse 77  Temp(Src) 97.5 F (36.4 C)  Ht 5\' 8"  (1.727 m)  Wt 174 lb 12.8 oz (79.289 kg)  BMI 26.58 kg/m2  General: Well-nourished, well-developed in no acute distress.  Mouth: Oropharyngeal mucosa moist and pink , no lesions erythema or exudate. Abdomen: Bowel sounds are normal, nontender, nondistended, no hepatosplenomegaly or masses, no abdominal bruits or hernia , no rebound or guarding.   Extremities: No lower extremity edema. No clubbing or deformities. Skin: Warm and dry, no jaundice.   Psych: Alert and cooperative, normal mood and affect.  Labs:  Reviewed labs. Lab Results  Component Value Date   WBC 6.5 11/09/2014   HGB 12.7 11/09/2014   HCT 39.7 11/09/2014   HCT 39.8 11/09/2014   MCV 67.6* 11/09/2014   PLT 261 11/09/2014   Lab Results  Component Value Date   IRON 157* 11/09/2014   TIBC 348 11/09/2014   FERRITIN 24 11/09/2014   Lab Results  Component Value Date   FERRITIN 24 11/09/2014    Imaging Studies: No results found.

## 2014-12-07 NOTE — Progress Notes (Signed)
cc'ed to pcp °

## 2014-12-07 NOTE — Patient Instructions (Signed)
1. Trial of Fusion Plus once daily (try taking at bedtime). Samples provided. If tolerated, we can call is RX. 2. Trial of Linzess 221mcg daily, 30 minutes before a meal. Samples provided. RX sent to pharmacy. 3. Repeat labs in 05/2015. 4. Return to the office in one year.  5. Call if you have ongoing problems with constipation.

## 2014-12-07 NOTE — Assessment & Plan Note (Signed)
Suspect Alpha Thalassemia but patient has declined work up as previously outlined. Recommendations from hematology: oral iron therapy and folic acid 1mg  daily. Patient has not been able to tolerate over-the-counter iron. Trial of fusion plus once daily at bedtime. Samples provided. She can call for prescription if tolerated. If she is unable to tolerate this, continued to try to get extra iron from her diet. If she is not able to tolerate fusion plus, she will also need to take over-the-counter folic acid supplement. Discussed at length with patient. We'll recheck her labs in 6 months including CBC, iron/TIBC, ferritin.

## 2014-12-07 NOTE — Assessment & Plan Note (Signed)
Increase Linzess to 222mcg daily on empty stomach. If not effective, patient to let us know. Otherwise OV in one year.

## 2015-01-10 ENCOUNTER — Other Ambulatory Visit: Payer: Self-pay

## 2015-02-10 ENCOUNTER — Ambulatory Visit (INDEPENDENT_AMBULATORY_CARE_PROVIDER_SITE_OTHER): Payer: 59 | Admitting: Nurse Practitioner

## 2015-02-10 ENCOUNTER — Encounter: Payer: Self-pay | Admitting: Nurse Practitioner

## 2015-02-10 VITALS — BP 124/72 | HR 73 | Temp 97.1°F | Ht 68.0 in | Wt 182.0 lb

## 2015-02-10 DIAGNOSIS — T162XXA Foreign body in left ear, initial encounter: Secondary | ICD-10-CM

## 2015-02-10 NOTE — Addendum Note (Signed)
Addended by: Chevis Pretty on: 02/10/2015 11:43 AM   Modules accepted: Level of Service

## 2015-02-10 NOTE — Progress Notes (Signed)
   Subjective:    Patient ID: Melissa Schneider, female    DOB: Apr 08, 1956, 59 y.o.   MRN: [id number]  HPI New patient at office who comes in c/o foreign body in left ear. Happened several days ago. Wears hearing aide and cannot put it in her ear.    Review of Systems  Constitutional: Negative.   HENT: Negative.   Respiratory: Negative.   Cardiovascular: Negative.   Genitourinary: Negative.   Neurological: Negative.   Psychiatric/Behavioral: Negative.   All other systems reviewed and are negative.      Objective:   Physical Exam  Constitutional: She is oriented to person, place, and time. She appears well-developed and well-nourished.  HENT:  Cotton tip in left ear canal- removed with alligator forcep  Cardiovascular: Normal rate, regular rhythm and normal heart sounds.   Pulmonary/Chest: Effort normal and breath sounds normal.  Neurological: She is alert and oriented to person, place, and time.  Skin: Skin is warm.  Psychiatric: She has a normal mood and affect. Her behavior is normal. Judgment and thought content normal.    BP 124/72 mmHg  Pulse 73  Temp(Src) 97.1 F (36.2 C) (Oral)  Ht 5\' 8"  (1.727 m)  Wt 182 lb (82.555 kg)  BMI 27.68 kg/m2       Assessment & Plan:   1. Foreign body in left ear, initial encounter    Do not use qtips in ear canals Debrox 2x a week RTO prn  Mary-Margaret Hassell Done, FNP

## 2015-03-09 ENCOUNTER — Other Ambulatory Visit (HOSPITAL_COMMUNITY)
Admission: RE | Admit: 2015-03-09 | Discharge: 2015-03-09 | Disposition: A | Discharge status: Home / Self Care | Payer: 59 | Source: Ambulatory Visit | Attending: "Endocrinology | Admitting: "Endocrinology

## 2015-03-09 DIAGNOSIS — I10 Essential (primary) hypertension: Secondary | ICD-10-CM | POA: Insufficient documentation

## 2015-03-09 DIAGNOSIS — Z713 Dietary counseling and surveillance: Secondary | ICD-10-CM | POA: Insufficient documentation

## 2015-03-09 DIAGNOSIS — E039 Hypothyroidism, unspecified: Secondary | ICD-10-CM | POA: Diagnosis not present

## 2015-03-09 DIAGNOSIS — E782 Mixed hyperlipidemia: Secondary | ICD-10-CM | POA: Insufficient documentation

## 2015-03-09 DIAGNOSIS — Z794 Long term (current) use of insulin: Secondary | ICD-10-CM | POA: Diagnosis not present

## 2015-03-09 DIAGNOSIS — E1165 Type 2 diabetes mellitus with hyperglycemia: Secondary | ICD-10-CM | POA: Insufficient documentation

## 2015-03-09 LAB — COMPREHENSIVE METABOLIC PANEL
ALBUMIN: 4.2 g/dL (ref 3.5–5.0)
ALK PHOS: 95 U/L (ref 38–126)
ALT: 16 U/L (ref 14–54)
AST: 21 U/L (ref 15–41)
Anion gap: 10 (ref 5–15)
BUN: 21 mg/dL — ABNORMAL HIGH (ref 6–20)
CO2: 26 mmol/L (ref 22–32)
CREATININE: 0.85 mg/dL (ref 0.44–1.00)
Calcium: 9.2 mg/dL (ref 8.9–10.3)
Chloride: 100 mmol/L — ABNORMAL LOW (ref 101–111)
GFR calc non Af Amer: >60 mL/min (ref >60)
GLUCOSE: 285 mg/dL — AB (ref 65–99)
Potassium: 3.9 mmol/L (ref 3.5–5.1)
SODIUM: 136 mmol/L (ref 135–145)
Total Bilirubin: 0.4 mg/dL (ref 0.3–1.2)
Total Protein: 7.3 g/dL (ref 6.5–8.1)

## 2015-03-10 LAB — HEMOGLOBIN A1C
Hgb A1c MFr Bld: 8.7 % — ABNORMAL HIGH (ref 4.8–5.6)
Mean Plasma Glucose: 203 mg/dL

## 2015-03-17 ENCOUNTER — Ambulatory Visit (INDEPENDENT_AMBULATORY_CARE_PROVIDER_SITE_OTHER): Payer: 59 | Admitting: Pediatrics

## 2015-03-17 ENCOUNTER — Encounter: Payer: Self-pay | Admitting: Pediatrics

## 2015-03-17 VITALS — BP 115/72 | HR 72 | Temp 97.1°F | Ht 62.0 in | Wt 182.6 lb

## 2015-03-17 DIAGNOSIS — E1142 Type 2 diabetes mellitus with diabetic polyneuropathy: Secondary | ICD-10-CM

## 2015-03-17 DIAGNOSIS — Z13220 Encounter for screening for lipoid disorders: Secondary | ICD-10-CM

## 2015-03-17 DIAGNOSIS — E039 Hypothyroidism, unspecified: Secondary | ICD-10-CM | POA: Diagnosis not present

## 2015-03-17 DIAGNOSIS — R635 Abnormal weight gain: Secondary | ICD-10-CM | POA: Diagnosis not present

## 2015-03-17 DIAGNOSIS — G43709 Chronic migraine without aura, not intractable, without status migrainosus: Secondary | ICD-10-CM | POA: Diagnosis not present

## 2015-03-17 DIAGNOSIS — E7800 Pure hypercholesterolemia, unspecified: Secondary | ICD-10-CM

## 2015-03-17 DIAGNOSIS — E1140 Type 2 diabetes mellitus with diabetic neuropathy, unspecified: Secondary | ICD-10-CM | POA: Insufficient documentation

## 2015-03-17 DIAGNOSIS — G43909 Migraine, unspecified, not intractable, without status migrainosus: Secondary | ICD-10-CM | POA: Insufficient documentation

## 2015-03-17 DIAGNOSIS — IMO0002 Reserved for concepts with insufficient information to code with codable children: Secondary | ICD-10-CM

## 2015-03-17 DIAGNOSIS — R319 Hematuria, unspecified: Secondary | ICD-10-CM | POA: Diagnosis not present

## 2015-03-17 DIAGNOSIS — E1165 Type 2 diabetes mellitus with hyperglycemia: Secondary | ICD-10-CM | POA: Diagnosis not present

## 2015-03-17 LAB — POCT UA - MICROSCOPIC ONLY
Bacteria, U Microscopic: NEGATIVE
CASTS, UR, LPF, POC: NEGATIVE
Crystals, Ur, HPF, POC: NEGATIVE
Mucus, UA: NEGATIVE
RBC, urine, microscopic: NEGATIVE
WBC, UR, HPF, POC: NEGATIVE
YEAST UA: NEGATIVE

## 2015-03-17 LAB — POCT URINALYSIS DIPSTICK
BILIRUBIN UA: NEGATIVE
Glucose, UA: NEGATIVE
Ketones, UA: NEGATIVE
Leukocytes, UA: NEGATIVE
NITRITE UA: NEGATIVE
Protein, UA: NEGATIVE
RBC UA: NEGATIVE
Spec Grav, UA: 1.01
Urobilinogen, UA: NEGATIVE
pH, UA: 7

## 2015-03-17 LAB — POCT UA - MICROALBUMIN: MICROALBUMIN (UR) POC: NEGATIVE mg/L

## 2015-03-17 MED ORDER — SUMATRIPTAN SUCCINATE 100 MG PO TABS: 100.0000 mg | ORAL_TABLET | ORAL | Status: DC | PRN

## 2015-03-17 MED ORDER — GABAPENTIN 300 MG PO CAPS: 300.0000 mg | ORAL_CAPSULE | Freq: Three times a day (TID) | ORAL | Status: DC

## 2015-03-17 MED ORDER — LEVOTHYROXINE SODIUM 75 MCG PO TABS: 75.0000 ug | ORAL_TABLET | Freq: Every day | ORAL | Status: DC

## 2015-03-17 MED ORDER — PRAVASTATIN SODIUM 40 MG PO TABS: 20.0000 mg | ORAL_TABLET | Freq: Every day | ORAL | Status: DC

## 2015-03-17 MED ORDER — LOSARTAN POTASSIUM 25 MG PO TABS: 25.0000 mg | ORAL_TABLET | Freq: Every day | ORAL | Status: DC

## 2015-03-17 NOTE — Patient Instructions (Signed)
Come back in 2-4 weeks for pap smear.

## 2015-03-17 NOTE — Progress Notes (Addendum)
Subjective:    Patient ID: Melissa Schneider, female    DOB: 1955/12/23, 59 y.o.   MRN: [id number]  HPI: Melissa Schneider is a 59 y.o. female presenting on 03/17/2015 for Follow-up  Gained 30 lbs in the last year eating the same thing.  DM2: for the last 5 years. On insulin for the last 3 years. Followed by Endocrine in [place].   Constipation with Diabetic gastroparesis: on linzess.    Microcytic anemia: Colonoscopy was normal. Negative fecal occult cards. Followed by GI. They referred to hematology for thalassemia eval, pt declined for now.  Some breast tenderness. Went through menopause at 17, no more hot flashes now.  Mammogram: due, schedule next month Pap smear 5 years ago normal. Pain with last eval.  Occasional palpitations with anxiety, increased stress with parents' poor health, drives to them every weekend to stay with them to give other relatives a break. Headaches worse then as well.   Relevant past medical, surgical, family and social history reviewed and updated as indicated. Interim medical history since our last visit reviewed. Allergies and medications reviewed and updated.  Review of Systems  Constitutional: Positive for malaise/fatigue. Negative for fever and chills.  HENT: Negative for congestion, ear pain and sore throat.   Eyes: Negative for blurred vision and double vision.  Respiratory: Negative.   Cardiovascular: Positive for palpitations. Negative for chest pain.  Gastrointestinal: Positive for constipation.  Genitourinary: Negative.   Skin: Negative for rash.  Neurological: Positive for headaches.  Psychiatric/Behavioral: Negative.     ROS: Per HPI unless specifically indicated above  Past Medical History Patient Active Problem List   Diagnosis Date Noted  . Migraine 03/17/2015  . Constipation 04/05/2014  . Microcytic anemia 04/05/2014  . Intractable nausea and vomiting 08/22/2013  . Hypokalemia 08/22/2013  . Dehydration 08/22/2013  .  Generalized weakness 08/22/2013  . DKA, type 2 08/22/2013  . DKA (diabetic ketoacidosis) 08/16/2013  . Recurrent obstructive pyelonephritis 08/14/2013  . UTI (urinary tract infection) 08/13/2013  . Leukocytosis, unspecified 08/13/2013  . Acute renal failure 08/13/2013  . Obstructive uropathy 08/13/2013  . Stone in kidney 08/13/2013  . Hyponatremia 07/23/2011  . Diabetic gastroparesis 07/23/2011  . Hypothyroidism 08/24/2010  . DM (diabetes mellitus), type 2, uncontrolled 08/24/2010  . HYPERCHOLESTEROLEMIA 08/24/2010    Current Outpatient Prescriptions  Medication Sig Dispense Refill  . docusate sodium (COLACE) 100 MG capsule Take 200 mg by mouth at bedtime.    . gabapentin (NEURONTIN) 300 MG capsule Take 1 capsule (300 mg total) by mouth 3 (three) times daily. 90 capsule 5  . insulin aspart (NOVOLOG) 100 UNIT/ML injection Inject 0-17 Units into the skin 3 (three) times daily before meals. SLIDING SCALE: FOR BLOOD SUGAR OF 121-150 TAKE 2 UNITS; FOR  BLOOD SUGAR OF 151-200 TAKE 4 UNITS; FOR BLOOD SUGAR OF 201-250 TAKE 7 UNITS; FOR BLOOD SUGAR OF 251-300 TAKE 9 UNITS; FOR BLOOD SUGAR OF 301-350 TAKE 12 UNITS; FOR BLOOD SUGAR OF 351-400 UNITS, TAKE 15 UNITS; FOR BLOOD SUGAR OF GREATER THAN 400 TAKE 17 UNITS AND CALL YOUR DOCTOR. 1 vial 6  . insulin glargine (LANTUS) 100 UNIT/ML injection Inject 20 Units into the skin at bedtime.     Marland Kitchen levothyroxine (SYNTHROID, LEVOTHROID) 75 MCG tablet Take 1 tablet (75 mcg total) by mouth daily before breakfast. 30 tablet 5  . Linaclotide (LINZESS) 290 MCG CAPS capsule Take 1 capsule (290 mcg total) by mouth daily. 30 capsule 5  . losartan (COZAAR) 25 MG tablet  Take 1 tablet (25 mg total) by mouth daily. 30 tablet 5  . Multiple Vitamin (MULTIVITAMIN WITH MINERALS) TABS tablet Take 1 tablet by mouth daily.    . pravastatin (PRAVACHOL) 40 MG tablet Take 0.5 tablets (20 mg total) by mouth daily. 30 tablet 5  . SUMAtriptan (IMITREX) 100 MG tablet Take 1 tablet  (100 mg total) by mouth every 2 (two) hours as needed. For migraines 10 tablet 3   No current facility-administered medications for this visit.       Objective:    BP 115/72 mmHg  Pulse 72  Temp(Src) 97.1 F (36.2 C) (Oral)  Ht 5\' 2"  (1.575 m)  Wt 182 lb 9.6 oz (82.827 kg)  BMI 33.39 kg/m2  Wt Readings from Last 3 Encounters:  03/17/15 182 lb 9.6 oz (82.827 kg)  02/10/15 182 lb (82.555 kg)  12/07/14 174 lb 12.8 oz (79.289 kg)     Gen: NAD, alert, cooperative with exam, NCAT EYES: EOMI, no scleral injection or icterus ENT:  TMs pearly gray b/l, OP without erythema LYMPH: no cervical LAD NECK:  Normal thyroid, no nodules BREAST:  Normal exam b/l, no masses palpated. Tender under axillary lineL lateral breast tissue, no induration, masses, nodules or redness in area. CV: NRRR, normal S1/S2, no murmur, DP pulses 2+ b/l Resp: CTABL, no wheezes, normal WOB Abd: +BS, soft, NTND Ext: No edema, warm Neuro: Alert and oriented, strength equal b/l UE and LE, coodrination grossly normal MSK: normal muscle bulk     Assessment & Plan:   Melissa Schneider was seen today for follow-up of multiple med problems. She has been tired, fatigued and with 30 lbs of weight gain in the last year as her insulin has been increased, invocana self-discontinued. Due for recheck thyroid level, on synthroid.   Diagnoses and all orders for this visit:  Weight gain -     Thyroid Panel With TSH  Hematuria No longer present. -     POCT UA - Microscopic Only -     POCT urinalysis dipstick  Screening for hyperlipidemia -     Lipid panel  DM (diabetes mellitus), type 2, uncontrolled -     POCT UA - Microalbumin -     losartan (COZAAR) 25 MG tablet; Take 1 tablet (25 mg total) by mouth daily.  Hypothyroidism, unspecified hypothyroidism type -     levothyroxine (SYNTHROID, LEVOTHROID) 75 MCG tablet; Take 1 tablet (75 mcg total) by mouth daily before breakfast.  HYPERCHOLESTEROLEMIA -     pravastatin  (PRAVACHOL) 40 MG tablet; Take 0.5 tablets (20 mg total) by mouth daily.  Chronic migraine without aura without status migrainosus, not intractable -     SUMAtriptan (IMITREX) 100 MG tablet; Take 1 tablet (100 mg total) by mouth every 2 (two) hours as needed. For migraines  Other orders -     gabapentin (NEURONTIN) 300 MG capsule; Take 1 capsule (300 mg total) by mouth 3 (three) times daily.    Follow up plan: Return in about 4 weeks (around 04/14/2015).  [name], MD [facility] 03/17/2015, 10:31 AM  ADDENDUM: Synthroid to 57mcg, recheck in 8 weeks.

## 2015-03-18 LAB — THYROID PANEL WITH TSH
FREE THYROXINE INDEX: 2.3 (ref 1.2–4.9)
T3 Uptake Ratio: 27 % (ref 24–39)
T4, Total: 8.7 ug/dL (ref 4.5–12.0)
TSH: 4.95 u[IU]/mL — ABNORMAL HIGH (ref 0.450–4.500)

## 2015-03-18 LAB — LIPID PANEL
CHOLESTEROL TOTAL: 215 mg/dL — AB (ref 100–199)
Chol/HDL Ratio: 2.3 ratio units (ref 0.0–4.4)
HDL: 92 mg/dL (ref >39)
LDL Calculated: 111 mg/dL — ABNORMAL HIGH (ref 0–99)
Triglycerides: 61 mg/dL (ref 0–149)
VLDL Cholesterol Cal: 12 mg/dL (ref 5–40)

## 2015-03-18 MED ORDER — LEVOTHYROXINE SODIUM 88 MCG PO TABS: 88.0000 ug | ORAL_TABLET | Freq: Every day | ORAL | Status: DC

## 2015-03-18 NOTE — Addendum Note (Signed)
Addended by: Eustaquio Maize on: 03/18/2015 01:05 PM   Modules accepted: Orders, Medications

## 2015-04-14 ENCOUNTER — Other Ambulatory Visit: Payer: 59 | Admitting: Pediatrics

## 2015-04-14 ENCOUNTER — Ambulatory Visit: Payer: 59 | Admitting: Pediatrics

## 2015-04-22 ENCOUNTER — Other Ambulatory Visit: Payer: 59 | Admitting: Pediatrics

## 2015-04-26 ENCOUNTER — Other Ambulatory Visit: Payer: Self-pay

## 2015-04-26 DIAGNOSIS — D509 Iron deficiency anemia, unspecified: Secondary | ICD-10-CM

## 2015-04-29 ENCOUNTER — Encounter: Payer: Self-pay | Admitting: Pediatrics

## 2015-04-29 ENCOUNTER — Ambulatory Visit (INDEPENDENT_AMBULATORY_CARE_PROVIDER_SITE_OTHER): Payer: 59 | Admitting: Pediatrics

## 2015-04-29 VITALS — Temp 97.7°F | Ht 62.0 in | Wt 182.4 lb

## 2015-04-29 DIAGNOSIS — E038 Other specified hypothyroidism: Secondary | ICD-10-CM

## 2015-04-29 DIAGNOSIS — Z124 Encounter for screening for malignant neoplasm of cervix: Secondary | ICD-10-CM

## 2015-04-29 DIAGNOSIS — R6889 Other general symptoms and signs: Secondary | ICD-10-CM | POA: Diagnosis not present

## 2015-04-29 NOTE — Progress Notes (Signed)
Subjective:    Patient ID: Melissa Schneider, female    DOB: 06-Jan-1956, 59 y.o.   MRN: [id number]  CC: URi symptoms  HPI: Melissa Schneider is a 59 y.o. female presenting on 04/29/2015 for Gynecologic Exam; Fever; Chills; and Generalized Body Aches  Started getting sick 3-4 days ago, nasal congestion, some cough. Has started taking mucinex, helped some Temperature to 99 first day Temp up to 101 last night Taking ibuprofen, every 4-6 hours Also taking theraflu, has helped her feel somewhat better Throat fine, was scratchy at first but getting better Headaches Due for pap smear  Mammogram a few weeks ago was normal Still not able to lose any weight  Relevant past medical, surgical, family and social history reviewed and updated as indicated. Interim medical history since our last visit reviewed. Allergies and medications reviewed and updated.   ROS: All other systems negative other than what is in HPI  Past Medical History Patient Active Problem List   Diagnosis Date Noted  . Flu-like symptoms 04/29/2015  . Migraine 03/17/2015  . Diabetic neuropathy ([place]) 03/17/2015  . Constipation 04/05/2014  . Microcytic anemia 04/05/2014  . Intractable nausea and vomiting 08/22/2013  . Hypokalemia 08/22/2013  . Dehydration 08/22/2013  . Generalized weakness 08/22/2013  . DKA, type 2 ([place]) 08/22/2013  . DKA (diabetic ketoacidosis) ([place]) 08/16/2013  . Recurrent obstructive pyelonephritis 08/14/2013  . UTI (urinary tract infection) 08/13/2013  . Leukocytosis, unspecified 08/13/2013  . Acute renal failure ([place]) 08/13/2013  . Obstructive uropathy 08/13/2013  . Stone in kidney 08/13/2013  . Hyponatremia 07/23/2011  . Diabetic gastroparesis ([place]) 07/23/2011  . Hypothyroidism 08/24/2010  . DM (diabetes mellitus), type 2, uncontrolled ([place]) 08/24/2010  . HYPERCHOLESTEROLEMIA 08/24/2010    Current Outpatient Prescriptions  Medication Sig Dispense Refill  . docusate sodium (COLACE)  100 MG capsule Take 200 mg by mouth at bedtime.    . gabapentin (NEURONTIN) 300 MG capsule Take 1 capsule (300 mg total) by mouth 3 (three) times daily. 90 capsule 5  . insulin aspart (NOVOLOG) 100 UNIT/ML injection Inject 0-17 Units into the skin 3 (three) times daily before meals. SLIDING SCALE: FOR BLOOD SUGAR OF 121-150 TAKE 2 UNITS; FOR  BLOOD SUGAR OF 151-200 TAKE 4 UNITS; FOR BLOOD SUGAR OF 201-250 TAKE 7 UNITS; FOR BLOOD SUGAR OF 251-300 TAKE 9 UNITS; FOR BLOOD SUGAR OF 301-350 TAKE 12 UNITS; FOR BLOOD SUGAR OF 351-400 UNITS, TAKE 15 UNITS; FOR BLOOD SUGAR OF GREATER THAN 400 TAKE 17 UNITS AND CALL YOUR DOCTOR. 1 vial 6  . insulin glargine (LANTUS) 100 UNIT/ML injection Inject 20 Units into the skin at bedtime.     Marland Kitchen levothyroxine (SYNTHROID, LEVOTHROID) 88 MCG tablet Take 1 tablet (88 mcg total) by mouth daily. 30 tablet 6  . Linaclotide (LINZESS) 290 MCG CAPS capsule Take 1 capsule (290 mcg total) by mouth daily. 30 capsule 5  . losartan (COZAAR) 25 MG tablet Take 1 tablet (25 mg total) by mouth daily. 30 tablet 5  . Multiple Vitamin (MULTIVITAMIN WITH MINERALS) TABS tablet Take 1 tablet by mouth daily.    . pravastatin (PRAVACHOL) 40 MG tablet Take 0.5 tablets (20 mg total) by mouth daily. 30 tablet 5  . SUMAtriptan (IMITREX) 100 MG tablet Take 1 tablet (100 mg total) by mouth every 2 (two) hours as needed. For migraines 10 tablet 3   No current facility-administered medications for this visit.       Objective:    Temp(Src) 97.7 F (36.5 C) (  Oral)  Ht 5\' 2"  (1.575 m)  Wt 182 lb 6.4 oz (82.736 kg)  BMI 33.35 kg/m2  Wt Readings from Last 3 Encounters:  04/29/15 182 lb 6.4 oz (82.736 kg)  03/17/15 182 lb 9.6 oz (82.827 kg)  02/10/15 182 lb (82.555 kg)    Gen: NAD, alert, cooperative with exam, NCAT EYES: EOMI, no scleral injection or icterus ENT:  TMs slightly dull b/l, OP with mild erythema LYMPH: no cervical LAD CV: NRRR, normal S1/S2, no murmur, distal pulses 2+ b/l Resp:  CTABL, no wheezes, normal WOB Abd: +BS, soft, NTND. no guarding or organomegaly Ext: No edema, warm Neuro: Alert and oriented GU: normal ext female genitalia, normal cervix, no discharge in vaginal vault     Assessment & Plan:    Melissa Schneider was seen today for gynecologic exam, flu-like illness.  Diagnoses and all orders for this visit:  Flu-like symptoms Discussed symptomatic care. Call if not better in 2-3 days.  Screening for cervical cancer  Hypothyroidism RTC in 2 weeks for recheck TSH  HCM: Come back next week for flu shot Will get records from prior colonoscopy, Dr. Erlinda Hong office UTD on mammogram, 03/2015 negative  Follow up plan: Return in about 3 months (around 07/30/2015) for med follow up.  [name], MD [facility] 04/29/2015, 3:04 PM

## 2015-04-29 NOTE — Patient Instructions (Signed)
Netipot with distilled water twice a day to clear out sinuses Ibuprofen and tylenol Lots of fluids Return around Nov 1 for recheck thyroid level

## 2015-05-02 LAB — PAP IG AND HPV HIGH-RISK
HPV, high-risk: NEGATIVE
PAP Smear Comment: 0

## 2015-05-09 ENCOUNTER — Ambulatory Visit: Payer: Self-pay | Admitting: *Deleted

## 2015-05-10 ENCOUNTER — Ambulatory Visit (INDEPENDENT_AMBULATORY_CARE_PROVIDER_SITE_OTHER): Payer: 59 | Admitting: Family

## 2015-05-10 DIAGNOSIS — Z23 Encounter for immunization: Secondary | ICD-10-CM

## 2015-05-10 NOTE — Progress Notes (Signed)
Patient given influenza injection. Tolerated well.

## 2015-05-13 ENCOUNTER — Other Ambulatory Visit: Payer: 59

## 2015-05-13 ENCOUNTER — Ambulatory Visit: Payer: 59 | Admitting: Family Medicine

## 2015-05-18 ENCOUNTER — Telehealth: Payer: Self-pay | Admitting: Internal Medicine

## 2015-05-18 NOTE — Telephone Encounter (Signed)
I had already faxed this one time. I have faxed it again today.

## 2015-05-18 NOTE — Telephone Encounter (Signed)
Melissa Schneider, isn't this one of the lab issues that we got from billing at [facility] to sign and provide written diagnosis instead of ICD-10. You put it in a folder on your desk with others I think. Will you please check?

## 2015-05-18 NOTE — Telephone Encounter (Signed)
Someone from Medical Records at [facility] called to see if LSL had signed some document that they had faxed over on the 19th. Also, asked for LSL to write the signs and symptoms on this patient. LSL, do you have anything in your office on this patient?

## 2015-05-31 ENCOUNTER — Other Ambulatory Visit: Payer: Self-pay | Admitting: "Endocrinology

## 2015-06-03 ENCOUNTER — Other Ambulatory Visit: Payer: Self-pay | Admitting: "Endocrinology

## 2015-06-08 ENCOUNTER — Other Ambulatory Visit (HOSPITAL_COMMUNITY)
Admission: RE | Admit: 2015-06-08 | Discharge: 2015-06-08 | Disposition: A | Discharge status: Home / Self Care | Payer: 59 | Source: Ambulatory Visit | Attending: "Endocrinology | Admitting: "Endocrinology

## 2015-06-08 DIAGNOSIS — E1165 Type 2 diabetes mellitus with hyperglycemia: Secondary | ICD-10-CM | POA: Insufficient documentation

## 2015-06-08 DIAGNOSIS — I10 Essential (primary) hypertension: Secondary | ICD-10-CM | POA: Diagnosis present

## 2015-06-08 DIAGNOSIS — E782 Mixed hyperlipidemia: Secondary | ICD-10-CM | POA: Insufficient documentation

## 2015-06-08 LAB — COMPREHENSIVE METABOLIC PANEL
ALT: 15 U/L (ref 14–54)
AST: 19 U/L (ref 15–41)
Albumin: 4 g/dL (ref 3.5–5.0)
Alkaline Phosphatase: 102 U/L (ref 38–126)
Anion gap: 6 (ref 5–15)
BUN: 17 mg/dL (ref 6–20)
CALCIUM: 9.1 mg/dL (ref 8.9–10.3)
CHLORIDE: 101 mmol/L (ref 101–111)
CO2: 28 mmol/L (ref 22–32)
CREATININE: 0.71 mg/dL (ref 0.44–1.00)
GFR calc Af Amer: >60 mL/min (ref >60)
GFR calc non Af Amer: >60 mL/min (ref >60)
Glucose, Bld: 295 mg/dL — ABNORMAL HIGH (ref 65–99)
Potassium: 4 mmol/L (ref 3.5–5.1)
Sodium: 135 mmol/L (ref 135–145)
Total Bilirubin: 0.3 mg/dL (ref 0.3–1.2)
Total Protein: 7.3 g/dL (ref 6.5–8.1)

## 2015-06-09 LAB — HEMOGLOBIN A1C
Hgb A1c MFr Bld: 9.2 % — ABNORMAL HIGH (ref 4.8–5.6)
Mean Plasma Glucose: 217 mg/dL

## 2015-06-16 ENCOUNTER — Ambulatory Visit (INDEPENDENT_AMBULATORY_CARE_PROVIDER_SITE_OTHER): Payer: 59 | Admitting: "Endocrinology

## 2015-06-16 ENCOUNTER — Encounter: Payer: Self-pay | Admitting: "Endocrinology

## 2015-06-16 VITALS — BP 120/80 | HR 82 | Ht 62.0 in

## 2015-06-16 DIAGNOSIS — E785 Hyperlipidemia, unspecified: Secondary | ICD-10-CM | POA: Diagnosis not present

## 2015-06-16 DIAGNOSIS — Z794 Long term (current) use of insulin: Secondary | ICD-10-CM | POA: Diagnosis not present

## 2015-06-16 DIAGNOSIS — I10 Essential (primary) hypertension: Secondary | ICD-10-CM

## 2015-06-16 DIAGNOSIS — IMO0002 Reserved for concepts with insufficient information to code with codable children: Secondary | ICD-10-CM

## 2015-06-16 DIAGNOSIS — E038 Other specified hypothyroidism: Secondary | ICD-10-CM | POA: Diagnosis not present

## 2015-06-16 DIAGNOSIS — E1165 Type 2 diabetes mellitus with hyperglycemia: Secondary | ICD-10-CM

## 2015-06-16 DIAGNOSIS — E118 Type 2 diabetes mellitus with unspecified complications: Secondary | ICD-10-CM

## 2015-06-16 MED ORDER — NOVOLOG FLEXPEN 100 UNIT/ML ~~LOC~~ SOPN: 10.0000 [IU] | PEN_INJECTOR | Freq: Three times a day (TID) | SUBCUTANEOUS | Status: DC

## 2015-06-16 NOTE — Progress Notes (Signed)
Subjective:    Patient ID: Melissa Schneider, female    DOB: 01/01/1956, PCP Eustaquio Maize, MD   Past Medical History  Diagnosis Date  . Diabetes mellitus   . Hearing difficulty   . Diabetic gastroparesis ([place]) 07/23/2011    Suspected  . Anemia 07/23/2011  . Microcytic anemia 07/23/2011  . Headache(784.0)   . Hypothyroidism   . Hyperlipidemia   . Kidney stones   . Anxiety   . Thalassemia minor     ????? not really worked up per pt but daughter has it???   Past Surgical History  Procedure Laterality Date  . Wrist surgery Right   . Cystoscopy w/ ureteral stent placement Right 08/13/2013    Procedure: CYSTOSCOPY WITH RETROGRADE PYELOGRAM/URETERAL STENT PLACEMENT;  Surgeon: Marissa Nestle, MD;  Location: [facility];  Service: Urology;  Laterality: Right;  . Extracorporeal shock wave lithotripsy Right 08/19/2013    Procedure: EXTRACORPOREAL SHOCK WAVE LITHOTRIPSY (ESWL) RIGHT URETERAL CALCULUS;  Surgeon: Marissa Nestle, MD;  Location: [facility];  Service: Urology;  Laterality: Right;  . Colonoscopy  2004    Dr. Jim Desanctis: small fissue and hemorrhoids  . Colonoscopy  12/2011    Dr. Paulita Fujita: normal   Social History   Social History  . Marital Status: Married    Spouse Name: N/A  . Number of Children: 1  . Years of Education: N/A   Occupational History  . transcriptionist    Social History Main Topics  . Smoking status: Never Smoker   . Smokeless tobacco: Never Used     Comment: Never smoked  . Alcohol Use: No  . Drug Use: No  . Sexual Activity: Not Asked   Other Topics Concern  . None   Social History Narrative   Outpatient Encounter Prescriptions as of 06/16/2015  Medication Sig  . docusate sodium (COLACE) 100 MG capsule Take 200 mg by mouth at bedtime.  . gabapentin (NEURONTIN) 300 MG capsule Take 1 capsule (300 mg total) by mouth 3 (three) times daily.  . insulin glargine (LANTUS) 100 UNIT/ML injection Inject 22 Units into the skin at bedtime.  Marland Kitchen levothyroxine  (SYNTHROID, LEVOTHROID) 88 MCG tablet Take 1 tablet (88 mcg total) by mouth daily.  . Linaclotide (LINZESS) 290 MCG CAPS capsule Take 1 capsule (290 mcg total) by mouth daily.  Marland Kitchen losartan (COZAAR) 25 MG tablet Take 1 tablet (25 mg total) by mouth daily.  . Multiple Vitamin (MULTIVITAMIN WITH MINERALS) TABS tablet Take 1 tablet by mouth daily.  Marland Kitchen NOVOLOG FLEXPEN 100 UNIT/ML FlexPen Inject 10-16 Units into the skin 3 (three) times daily with meals.  . pravastatin (PRAVACHOL) 40 MG tablet Take 0.5 tablets (20 mg total) by mouth daily.  . SUMAtriptan (IMITREX) 100 MG tablet Take 1 tablet (100 mg total) by mouth every 2 (two) hours as needed. For migraines  . [DISCONTINUED] LANTUS SOLOSTAR 100 UNIT/ML Solostar Pen INJECT 18 UNITS SUB-Q AT BEDTIME  . [DISCONTINUED] NOVOLOG FLEXPEN 100 UNIT/ML FlexPen INJECT 10 UNITS INTO THE SKIN THREE TIMES A DAY   No facility-administered encounter medications on file as of 06/16/2015.   ALLERGIES: Allergies  Allergen Reactions  . Codeine Nausea Only and Other (See Comments)    NAUSEA AND CAUSES HER TO HAVE BAD DREAMS  . Lisinopril     Coughing   VACCINATION STATUS: Immunization History  Administered Date(s) Administered  . Influenza Whole 05/16/2010  . Influenza,inj,Quad PF,36+ Mos 05/10/2015  . Pneumococcal Polysaccharide-23 07/16/2008  . Td 07/17/2007  Diabetes She presents for her follow-up diabetic visit. She has type 2 diabetes mellitus. Onset time: She was diagnosed at approximate age of 56 years. Her disease course has been stable. There are no hypoglycemic associated symptoms. Pertinent negatives for hypoglycemia include no confusion, headaches, pallor, seizures or tremors. Associated symptoms include fatigue, polydipsia and polyuria. Pertinent negatives for diabetes include no chest pain and no polyphagia. There are no hypoglycemic complications. Symptoms are stable. There are no diabetic complications. Risk factors for coronary artery disease  include dyslipidemia, diabetes mellitus and sedentary lifestyle. Current diabetic treatment includes intensive insulin program and oral agent (monotherapy). She is compliant with treatment most of the time. Her weight is stable. She is following a generally unhealthy diet. She has had a previous visit with a dietitian. She never participates in exercise. Her home blood glucose trend is increasing steadily. (She did not bring the meter nor log book to review. ) An ACE inhibitor/angiotensin II receptor blocker is being taken.  Hyperlipidemia This is a chronic problem. The current episode started more than 1 year ago. Pertinent negatives include no chest pain, myalgias or shortness of breath. Current antihyperlipidemic treatment includes statins.  Hypertension This is a chronic problem. The current episode started more than 1 year ago. Pertinent negatives include no chest pain, headaches, palpitations or shortness of breath. Past treatments include angiotensin blockers. Hypertensive end-organ damage includes a thyroid problem.  Thyroid Problem Presents for follow-up visit. Symptoms include fatigue. Patient reports no cold intolerance, diarrhea, heat intolerance, palpitations or tremors. The symptoms have been improving. Past treatments include levothyroxine. Her past medical history is significant for hyperlipidemia.     Review of Systems  Constitutional: Positive for fatigue. Negative for unexpected weight change.  HENT: Negative for trouble swallowing and voice change.   Eyes: Negative for visual disturbance.  Respiratory: Negative for cough, shortness of breath and wheezing.   Cardiovascular: Negative for chest pain, palpitations and leg swelling.  Gastrointestinal: Negative for nausea, vomiting and diarrhea.  Endocrine: Positive for polydipsia and polyuria. Negative for cold intolerance, heat intolerance and polyphagia.  Musculoskeletal: Negative for myalgias and arthralgias.  Skin: Negative for  color change, pallor, rash and wound.  Neurological: Negative for tremors, seizures and headaches.  Psychiatric/Behavioral: Negative for suicidal ideas and confusion.    Objective:    BP 120/80 mmHg  Pulse 82  Ht 5\' 2"  (1.575 m)  Wt   SpO2 92%  Wt Readings from Last 3 Encounters:  04/29/15 182 lb 6.4 oz (82.736 kg)  03/17/15 182 lb 9.6 oz (82.827 kg)  02/10/15 182 lb (82.555 kg)    Physical Exam  Constitutional: She is oriented to person, place, and time. She appears well-developed.  HENT:  Head: Normocephalic and atraumatic.  Eyes: EOM are normal.  Neck: Normal range of motion. Neck supple. No tracheal deviation present. No thyromegaly present.  Cardiovascular: Normal rate and regular rhythm.   Pulmonary/Chest: Effort normal and breath sounds normal.  Abdominal: Soft. Bowel sounds are normal. There is no tenderness. There is no guarding.  Musculoskeletal: Normal range of motion. She exhibits no edema.  Neurological: She is alert and oriented to person, place, and time. She has normal reflexes. No cranial nerve deficit. Coordination normal.  Skin: Skin is warm and dry. No rash noted. No erythema. No pallor.  Psychiatric: She has a normal mood and affect. Judgment normal.    Results for orders placed or performed during the hospital encounter of 06/08/15  Hemoglobin A1c  Result Value Ref Range  Hgb A1c MFr Bld 9.2 (H) 4.8 - 5.6 %   Mean Plasma Glucose 217 mg/dL  Comprehensive metabolic panel  Result Value Ref Range   Sodium 135 135 - 145 mmol/L   Potassium 4.0 3.5 - 5.1 mmol/L   Chloride 101 101 - 111 mmol/L   CO2 28 22 - 32 mmol/L   Glucose, Bld 295 (H) 65 - 99 mg/dL   BUN 17 6 - 20 mg/dL   Creatinine, Ser 0.71 0.44 - 1.00 mg/dL   Calcium 9.1 8.9 - 10.3 mg/dL   Total Protein 7.3 6.5 - 8.1 g/dL   Albumin 4.0 3.5 - 5.0 g/dL   AST 19 15 - 41 U/L   ALT 15 14 - 54 U/L   Alkaline Phosphatase 102 38 - 126 U/L   Total Bilirubin 0.3 0.3 - 1.2 mg/dL   GFR calc non Af Amer  >60 >60 mL/min   GFR calc Af Amer >60 >60 mL/min   Anion gap 6 5 - 15   Complete Blood Count (Most recent): Lab Results  Component Value Date   WBC 6.5 11/09/2014   HGB 12.7 11/09/2014   HCT 39.7 11/09/2014   HCT 39.8 11/09/2014   MCV 67.6* 11/09/2014   PLT 261 11/09/2014   Chemistry (most recent): Lab Results  Component Value Date   NA 135 06/08/2015   K 4.0 06/08/2015   CL 101 06/08/2015   CO2 28 06/08/2015   BUN 17 06/08/2015   CREATININE 0.71 06/08/2015   Diabetic Labs (most recent): Lab Results  Component Value Date   HGBA1C 9.2* 06/08/2015   HGBA1C 8.7* 03/09/2015   HGBA1C 8.8* 11/09/2014   Lipid profile (most recent): Lab Results  Component Value Date   TRIG 61 03/17/2015   CHOL 215* 03/17/2015     Assessment & Plan:   1. Uncontrolled type 2 diabetes mellitus with complication, with long-term current use of insulin ([place]) -She  remains at a high risk for more acute and chronic complications of diabetes which include CAD, CVA, CKD, retinopathy, and neuropathy. These are all discussed in detail with the patient.  Patient came with out her meter nor glucose profile, but  recent A1c increased to 9.2 %.    Recent labs reviewed.   - I have re-counseled the patient on diet management   by adopting a carbohydrate restricted / protein rich  Diet.  - Suggestion is made for patient to avoid simple carbohydrates   from their diet including Cakes , Desserts, Ice Cream,  Soda (  diet and regular) , Sweet Tea , Candies,  Chips, Cookies, Artificial Sweeteners,   and "Sugar-free" Products .  This will help patient to have stable blood glucose profile and potentially avoid unintended  Weight gain.  - Patient is advised to stick to a routine mealtimes to eat 3 meals  a day and avoid unnecessary snacks ( to snack only to correct hypoglycemia).  - She has seen a dietitian before.  - I have approached patient with the following individualized plan to manage diabetes and patient  agrees.  - I advised her to be more consistent in meal timing and portion size, will continue  Lantus at  22 units qhs, increase  Novolog  To 10 units TIDAC plus SSI. She will call me if she has hypoglycemia below 70.  She will continue MTF 1000mg  po BID. - Patient will be considered for incretin therapy as appropriate next visit. - Patient specific target  for A1c; LDL, HDL, Triglycerides,  and  Waist Circumference were discussed in detail.  2) BP/HTN: Controlled. Continue current medications including ACEI/ARB. 3) Lipids/HPL:  continue statins. 4)  Weight/Diet: CDE consult in progress, exercise, and carbohydrates information provided.  5) hypothyroidism: She is clinically euthyroid. I advised her to continue levothyroxine 88 g by mouth every morning.  - We discussed about correct intake of levothyroxine, at fasting, with water, separated by at least 30 minutes from breakfast, and separated by more than 4 hours from calcium, iron, multivitamins, acid reflux medications (PPIs). -Patient is made aware of the fact that thyroid hormone replacement is needed for life, dose to be adjusted by periodic monitoring of thyroid function tests.   6) Chronic Care/Health Maintenance:  -Patient  on ACEI/ARB and Statin medications and encouraged to continue to follow up with Ophthalmology, Podiatrist at least yearly or according to recommendations, and advised to  stay away from smoking. I have recommended yearly flu vaccine and pneumonia vaccination at least every 5 years; moderate intensity exercise for up to 150 minutes weekly; and  sleep for at least 7 hours a day.  - 25 minutes of time was spent on the care of this patient , 50% of which was applied for counseling on diabetes complications and their preventions.  - I advised patient to maintain close follow up with Eustaquio Maize, MD for primary care needs.  Patient is asked to bring meter and  blood glucose logs during their next visit.   Follow up  plan: -Return in about 3 months (around 09/14/2015) for diabetes, high blood pressure, high cholesterol, underactive thyroid.  Glade Lloyd, MD Phone: [phone]  Fax: [phone]   06/16/2015, 6:09 PM

## 2015-06-23 ENCOUNTER — Other Ambulatory Visit: Payer: Self-pay | Admitting: *Deleted

## 2015-06-23 DIAGNOSIS — G43709 Chronic migraine without aura, not intractable, without status migrainosus: Secondary | ICD-10-CM

## 2015-06-24 MED ORDER — SUMATRIPTAN SUCCINATE 100 MG PO TABS: 100.0000 mg | ORAL_TABLET | ORAL | Status: DC | PRN

## 2015-07-15 ENCOUNTER — Encounter: Payer: Self-pay | Admitting: *Deleted

## 2015-07-20 MED FILL — TRUE METRIX GLUCOSE TEST ST: 90 days supply | Qty: 400 | Fill #1

## 2015-07-26 ENCOUNTER — Other Ambulatory Visit: Payer: Self-pay

## 2015-07-26 DIAGNOSIS — G43709 Chronic migraine without aura, not intractable, without status migrainosus: Secondary | ICD-10-CM

## 2015-07-26 MED ORDER — SUMATRIPTAN SUCCINATE 100 MG PO TABS: 100.0000 mg | ORAL_TABLET | ORAL | Status: DC | PRN

## 2015-07-26 MED FILL — SUMATRIPTAN SUCC 100 MG TAB: 100 | 30 days supply | Qty: 9 | Fill #0

## 2015-07-26 NOTE — Telephone Encounter (Signed)
Last seen 05/10/15  [facility]

## 2015-08-01 ENCOUNTER — Other Ambulatory Visit: Payer: 59

## 2015-08-01 ENCOUNTER — Ambulatory Visit: Payer: 59 | Admitting: Family

## 2015-08-01 DIAGNOSIS — E038 Other specified hypothyroidism: Secondary | ICD-10-CM

## 2015-08-02 LAB — THYROID PANEL WITH TSH
FREE THYROXINE INDEX: 2.3 (ref 1.2–4.9)
T3 UPTAKE RATIO: 26 % (ref 24–39)
T4 TOTAL: 8.7 ug/dL (ref 4.5–12.0)
TSH: 6.07 u[IU]/mL — AB (ref 0.450–4.500)

## 2015-08-04 ENCOUNTER — Telehealth: Payer: Self-pay | Admitting: Pediatrics

## 2015-08-04 DIAGNOSIS — E039 Hypothyroidism, unspecified: Secondary | ICD-10-CM

## 2015-08-04 MED ORDER — LEVOTHYROXINE SODIUM 112 MCG PO TABS: 112.0000 ug | ORAL_TABLET | Freq: Every day | ORAL | Status: DC

## 2015-08-04 NOTE — Telephone Encounter (Signed)
TSH remains elevated, increasing levothyroxine

## 2015-08-08 ENCOUNTER — Ambulatory Visit: Payer: 59 | Admitting: Family

## 2015-08-10 ENCOUNTER — Encounter: Payer: Self-pay | Admitting: Pediatrics

## 2015-08-10 ENCOUNTER — Ambulatory Visit (INDEPENDENT_AMBULATORY_CARE_PROVIDER_SITE_OTHER): Payer: 59 | Admitting: Pediatrics

## 2015-08-10 VITALS — BP 118/70 | HR 71 | Temp 97.5°F | Ht 62.0 in | Wt 191.8 lb

## 2015-08-10 DIAGNOSIS — E785 Hyperlipidemia, unspecified: Secondary | ICD-10-CM | POA: Diagnosis not present

## 2015-08-10 DIAGNOSIS — Z794 Long term (current) use of insulin: Secondary | ICD-10-CM | POA: Diagnosis not present

## 2015-08-10 DIAGNOSIS — E038 Other specified hypothyroidism: Secondary | ICD-10-CM

## 2015-08-10 DIAGNOSIS — Z638 Other specified problems related to primary support group: Secondary | ICD-10-CM | POA: Diagnosis not present

## 2015-08-10 DIAGNOSIS — Z6835 Body mass index (BMI) 35.0-35.9, adult: Secondary | ICD-10-CM

## 2015-08-10 DIAGNOSIS — R0789 Other chest pain: Secondary | ICD-10-CM | POA: Diagnosis not present

## 2015-08-10 DIAGNOSIS — E118 Type 2 diabetes mellitus with unspecified complications: Secondary | ICD-10-CM

## 2015-08-10 DIAGNOSIS — F439 Reaction to severe stress, unspecified: Secondary | ICD-10-CM

## 2015-08-10 DIAGNOSIS — IMO0002 Reserved for concepts with insufficient information to code with codable children: Secondary | ICD-10-CM

## 2015-08-10 DIAGNOSIS — E1165 Type 2 diabetes mellitus with hyperglycemia: Secondary | ICD-10-CM | POA: Diagnosis not present

## 2015-08-10 MED ORDER — METFORMIN HCL 500 MG PO TABS: 500.0000 mg | ORAL_TABLET | Freq: Two times a day (BID) | ORAL | Status: DC

## 2015-08-10 NOTE — Progress Notes (Signed)
Subjective:    Patient ID: Melissa Schneider, female    DOB: 24-Feb-1956, 60 y.o.   MRN: [id number]  CC: Follow-up multiple med problems  HPI: Melissa Schneider is a 60 y.o. female presenting for Follow-up  Parents have dementia, spends weekends with parents, sister with them during the week Gets stressed takin gcare of them Mood is ok  Does have chest pain at times, not associated with exertion, happens a lot at night, takes an aspirin and it helps. Feels like a pressure in her chest. No SOB.  Due for eye exam in March--seen at [facility] in past   Depression screen [facility] 2/9 08/10/2015 04/29/2015 03/17/2015  Decreased Interest 1 0 0  Down, Depressed, Hopeless 1 0 0  PHQ - 2 Score 2 0 0  Altered sleeping 1 - -  Tired, decreased energy 1 - -  Change in appetite 0 - -  Feeling bad or failure about yourself  0 - -  Trouble concentrating 0 - -  Moving slowly or fidgety/restless 0 - -  Suicidal thoughts 0 - -  PHQ-9 Score 4 - -     Relevant past medical, surgical, family and social history reviewed and updated as indicated. Interim medical history since our last visit reviewed. Allergies and medications reviewed and updated.    ROS: Per HPI unless specifically indicated above  History  Smoking status  . Never Smoker   Smokeless tobacco  . Never Used    Comment: Never smoked    Past Medical History Patient Active Problem List   Diagnosis Date Noted  . Chest pain 08/11/2015  . BMI 35.0-35.9,adult 08/11/2015  . Hyperlipidemia 06/16/2015  . Migraine 03/17/2015  . Diabetic neuropathy ([place]) 03/17/2015  . Constipation 04/05/2014  . Microcytic anemia 04/05/2014  . Recurrent obstructive pyelonephritis 08/14/2013  . Leukocytosis, unspecified 08/13/2013  . Obstructive uropathy 08/13/2013  . Stone in kidney 08/13/2013  . Diabetic gastroparesis ([place]) 07/23/2011  . Hypothyroidism 08/24/2010  . DM (diabetes mellitus), type 2, uncontrolled ([place]) 08/24/2010    Current  Outpatient Prescriptions  Medication Sig Dispense Refill  . docusate sodium (COLACE) 100 MG capsule Take 200 mg by mouth at bedtime.    . gabapentin (NEURONTIN) 300 MG capsule Take 1 capsule (300 mg total) by mouth 3 (three) times daily. 90 capsule 5  . insulin glargine (LANTUS) 100 UNIT/ML injection Inject 22 Units into the skin at bedtime.    Marland Kitchen levothyroxine (SYNTHROID, LEVOTHROID) 112 MCG tablet Take 1 tablet (112 mcg total) by mouth daily. 90 tablet 3  . Linaclotide (LINZESS) 290 MCG CAPS capsule Take 1 capsule (290 mcg total) by mouth daily. 30 capsule 5  . losartan (COZAAR) 25 MG tablet Take 1 tablet (25 mg total) by mouth daily. 30 tablet 5  . Multiple Vitamin (MULTIVITAMIN WITH MINERALS) TABS tablet Take 1 tablet by mouth daily.    Marland Kitchen NOVOLOG FLEXPEN 100 UNIT/ML FlexPen Inject 10-16 Units into the skin 3 (three) times daily with meals. 15 mL 2  . pravastatin (PRAVACHOL) 40 MG tablet Take 0.5 tablets (20 mg total) by mouth daily. 30 tablet 5  . SUMAtriptan (IMITREX) 100 MG tablet Take 1 tablet (100 mg total) by mouth every 2 (two) hours as needed. For migraines 10 tablet 3  . metFORMIN (GLUCOPHAGE) 500 MG tablet Take 1 tablet (500 mg total) by mouth 2 (two) times daily with a meal. 180 tablet 3   No current facility-administered medications for this visit.  Objective:    BP 118/70 mmHg  Pulse 71  Temp(Src) 97.5 F (36.4 C) (Oral)  Ht 5\' 2"  (1.575 m)  Wt 191 lb 12.8 oz (87 kg)  BMI 35.07 kg/m2  Wt Readings from Last 3 Encounters:  08/10/15 191 lb 12.8 oz (87 kg)  04/29/15 182 lb 6.4 oz (82.736 kg)  03/17/15 182 lb 9.6 oz (82.827 kg)     Gen: NAD, alert, cooperative with exam, NCAT EYES: EOMI, no scleral injection or icterus ENT:  TMs pearly gray b/l, OP without erythema LYMPH: no cervical LAD CV: NRRR, normal S1/S2, no murmur, distal pulses 2+ b/l Resp: CTABL, no wheezes, normal WOB Abd: +BS, soft, NTND. no guarding or organomegaly Ext: No edema, warm Neuro: Alert  and oriented, strength equal b/l UE and LE, coordination grossly normal MSK: normal muscle bulk     Assessment & Plan:    [name] was seen today for follow-up multiple med problems  Diagnoses and all orders for this visit:  Uncontrolled type 2 diabetes mellitus with complication, with long-term current use of insulin ([place]) Followed by endocrine. Due for eye exam, foot exam. Completed today. Last HgA1c 9.2. On long acting and short acting insulin, in addition to metformin. Due for urine microalbumin. -     metFORMIN (GLUCOPHAGE) 500 MG tablet; Take 1 tablet (500 mg total) by mouth 2 (two) times daily with a meal. -     Microalbumin/Creatinine Ratio, Urine  Other chest pain Atypical chest pain, happens regularly. Pt with multiple risk factors including DM2, HTN, HLD. Will refer to cardiology for stress test.  Stress at home Taking care of mother with dementia. Not controlled DM2 as well as before with increased stress. Mood has been fine. Offered support, will let me know if worsens.  BMI 35.0-35.9,adult Discussed lifestyle changes, difficult with taking care of parents and working full time.  Other specified hypothyroidism Not taking thyroid medicine regularly. Recently increased dose. Due for recheck in 2 months.  Hyperlipidemia Continue pravastatin   Follow up plan: 2 months to recheck thyroid levels  [name], MD [facility] 08/10/2015, 12:58 PM

## 2015-08-11 DIAGNOSIS — R079 Chest pain, unspecified: Secondary | ICD-10-CM | POA: Insufficient documentation

## 2015-08-11 DIAGNOSIS — Z6835 Body mass index (BMI) 35.0-35.9, adult: Secondary | ICD-10-CM | POA: Insufficient documentation

## 2015-08-11 LAB — MICROALBUMIN / CREATININE URINE RATIO
CREATININE, UR: 51.5 mg/dL
MICROALB/CREAT RATIO: <5.8 mg/g creat (ref 0.0–30.0)
Microalbumin, Urine: <3 ug/mL

## 2015-08-12 ENCOUNTER — Telehealth: Payer: Self-pay | Admitting: *Deleted

## 2015-08-12 NOTE — Telephone Encounter (Signed)
-----   Message from Eustaquio Maize, MD sent at 08/11/2015  8:43 PM EST ----- Regarding: referring to cardiology for stress test Hi Tammera Engert, can you let pt know, I am referring her to cardiology so they can do the stress test. We talked about the the stress test in her clinic visit but I didn't mention the cardiology referral to get it done. Thank you!

## 2015-08-12 NOTE — Telephone Encounter (Signed)
Patient aware that a referral to cardiology has been made to have stress done and should be expecting a phone call to have this scheduled

## 2015-08-15 ENCOUNTER — Other Ambulatory Visit: Payer: Self-pay | Admitting: *Deleted

## 2015-08-15 MED FILL — metFORMIN HCL 500 MG TABS: 500 | 30 days supply | Qty: 60 | Fill #3

## 2015-08-15 MED FILL — LANTUS SOLOSTAR 100 UNITS/M: 100 | 84 days supply | Qty: 15 | Fill #1

## 2015-08-15 MED FILL — LEVOTHYROXINE 112 MCG TAB: 112 | 90 days supply | Qty: 90 | Fill #0

## 2015-08-17 NOTE — Patient Outreach (Signed)
Melissa Schneider [facility]) Care Management   08/15/15  Melissa Schneider 10/01/1955 [id number]  Melissa Schneider is an 60 y.o. female who presents to the [facility] office with her husband Melissa Schneider for routine Link To Wellness follow up for self management assistance with Type II DM, HTN and hyperlipidemia.  Subjective:  Melissa Schneider says she saw her primary care MD earlier this week and has been referred to a cardiologist for c/o intermittent chest pain mostly at night. She also says she has been under increased stress as she spends the weekend with her aging parents in their home because they both have dementia. 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 She says the neuropathy in her hands and feet has improved significantly and she is only taking gabapentin at night because she felt the daytime doses were causing lethargy.  Objective:   Review of Systems  Constitutional: Negative.     Physical Exam  Constitutional: She is oriented to person, place, and time. She appears well-developed and well-nourished.  Respiratory: Effort normal.  Neurological: She is alert and oriented to person, place, and time.  Skin: Skin is warm and dry.  Psychiatric: She has a normal mood and affect. Her behavior is normal. Judgment and thought content normal.    Current Medications:   Current Outpatient Prescriptions  Medication Sig Dispense Refill  . docusate sodium (COLACE) 100 MG capsule Take 200 mg by mouth at bedtime.    . gabapentin (NEURONTIN) 300 MG capsule Take 1 capsule (300 mg total) by mouth 3 (three) times daily. 90 capsule 5  . insulin glargine (LANTUS) 100 UNIT/ML injection Inject 22 Units into the skin at bedtime.    Marland Kitchen levothyroxine (SYNTHROID, LEVOTHROID) 112 MCG tablet Take 1 tablet  (112 mcg total) by mouth daily. 90 tablet 3  . Linaclotide (LINZESS) 290 MCG CAPS capsule Take 1 capsule (290 mcg total) by mouth daily. 30 capsule 5  . losartan (COZAAR) 25 MG tablet Take 1 tablet (25 mg total) by mouth daily. 30 tablet 5  . metFORMIN (GLUCOPHAGE) 500 MG tablet Take 1 tablet (500 mg total) by mouth 2 (two) times daily with a meal. 180 tablet 3  . Multiple Vitamin (MULTIVITAMIN WITH MINERALS) TABS tablet Take 1 tablet by mouth daily.    Marland Kitchen NOVOLOG FLEXPEN 100 UNIT/ML FlexPen Inject 10-16 Units into the skin 3 (three) times daily with meals. 15 mL 2  . pravastatin (PRAVACHOL) 40 MG tablet Take 0.5 tablets (20 mg total) by mouth daily. 30 tablet 5  . SUMAtriptan (IMITREX) 100 MG tablet Take 1 tablet (100 mg total) by mouth every 2 (two) hours as needed. For migraines 10 tablet 3   No current facility-administered medications for this visit.    Functional Status:   In your present state of health, do you have any difficulty performing the following activities: 08/15/2015  Hearing? Y  Vision? N  Difficulty concentrating or making decisions? N  Walking or climbing stairs? N  Dressing or bathing? N  Doing errands, shopping? N    Fall/Depression Screening:    PHQ 2/9 Scores 08/10/2015 04/29/2015 03/17/2015  PHQ - 2 Score 2 0 0  PHQ- 9 Score 4 - -    Assessment:   Melissa Schneider employee with Type II  DM, HTN and hyperlipidemia meeting treatment targets for HTN but with elevated total cholesterol 215, LDL= 111 on 03/17/15, Hgb A1C= 9.2% on 06/08/15  Plan:   Melissa Schneider        Most Recent Value   Care Plan Problem Schneider  Patient with Type II DM, HTN and hyperlipidemia, meeting treatment targets for HTN , total cholesterol= 215, LDL= 111 on 03/17/15,  most recent Hgb A1C= 9.2% on 06/08/15   Role Documenting the Problem Schneider  Care Management Telephonic [place] for Problem Schneider  Active   [facility] Long Term Goal (31-90 days)  Improved glycemic control as evidenced  by Hgb A1C < 8.0% at next assessment, HTN will remain in good control as evidenced by BP readings <140/<90, lipid profile will show improvement at next assessment, patient will inquire about wearing a continuous glucose meter when she sees Dr. Dorris Fetch on 09/16/15   [facility] Long Term Goal Start Date  08/15/15   Interventions for Problem Schneider Long Term Goal  reviewed health status changes since last visit, reviewed medications, reviewed CBG history, discussed role of continuous glucose monitor and Link To Wellness/[place] plan benefit coverage, provided written handout on Dexcom device, encouraged Melissa Schneider to ask Dr Dorris Fetch to prescribe the monitor to assess blood glucose patterns, will arrange for Link To Wellness follow up after she sees Dr. Dorris Fetch on 09/16/15     RNCM to fax today's office visit note to Dr. Dorris Fetch and Dr. Evette Doffing. RNCM will meet quarterly and as needed with patient per Link To Wellness program guidelines to assist with Type II DM, HTN and hyperlipidemia self-management and assess patient's progress toward mutually set goals.  Melissa Ellison RN,CCM,CDE [place] Management Coordinator Link To Wellness Office Phone [phone] Office Fax [phone]

## 2015-08-31 ENCOUNTER — Ambulatory Visit: Payer: 59 | Admitting: Pediatrics

## 2015-09-02 ENCOUNTER — Other Ambulatory Visit: Payer: Self-pay

## 2015-09-02 MED FILL — UNIFINE PENTIPS 32GX5/32: 32G X 4 MM | 90 days supply | Qty: 400 | Fill #0

## 2015-09-02 MED FILL — NOVOLOG FLEXPEN SYRINGE: 100 | 50 days supply | Qty: 15 | Fill #2

## 2015-09-02 MED FILL — LOSARTAN POTASSIUM 25 MG TA: 25 | 30 days supply | Qty: 30 | Fill #0

## 2015-09-02 MED FILL — PRAVASTATIN NA 40 MG TAB: 40 | 90 days supply | Qty: 45 | Fill #0

## 2015-09-02 MED FILL — SUMATRIPTAN SUCC 100 MG TAB: 100 | 90 days supply | Qty: 27 | Fill #1

## 2015-09-06 ENCOUNTER — Encounter: Payer: Self-pay | Admitting: Cardiology

## 2015-09-06 ENCOUNTER — Ambulatory Visit (INDEPENDENT_AMBULATORY_CARE_PROVIDER_SITE_OTHER): Payer: 59 | Admitting: Cardiology

## 2015-09-06 VITALS — BP 126/80 | HR 81 | Ht 62.0 in | Wt 192.0 lb

## 2015-09-06 DIAGNOSIS — R011 Cardiac murmur, unspecified: Secondary | ICD-10-CM | POA: Diagnosis not present

## 2015-09-06 DIAGNOSIS — R079 Chest pain, unspecified: Secondary | ICD-10-CM | POA: Diagnosis not present

## 2015-09-06 NOTE — Progress Notes (Signed)
Patient ID: Melissa Schneider, female   DOB: [birth year], 60 y.o.   MRN: [id number]     Clinical Summary Ms. Durben is a 60 y.o.female seen today as a new patient for the following medical problems. She is referred by Dr [name].  1. Chest pain - started off and on x 10 years ago. Squeezing like feeling left sided, 4-5/10. Often occurs at rest. No other associated symptoms. Not positional, but can feel better with deep breaths. Lasts up to 1 hour. Occurs approx once a week. No relation to food. Can have some dysphagia at times that is separate. Has had some increase in frequency, stable severity - can get some DOE, example walking up stairs which is new, though she does report 40 lbs weight gain over the last year. - no LE edema, no orthopnea, no PND.   CAD risk factors: DM2, HL, brother MI age 45.       Past Medical History  Diagnosis Date  . Diabetes mellitus   . Hearing difficulty   . Diabetic gastroparesis ([place]) 07/23/2011    Suspected  . Anemia 07/23/2011  . Microcytic anemia 07/23/2011  . Headache(784.0)   . Hypothyroidism   . Hyperlipidemia   . Kidney stones   . Anxiety   . Thalassemia minor     ????? not really worked up per pt but daughter has it???     Allergies  Allergen Reactions  . Codeine Nausea Only and Other (See Comments)    NAUSEA AND CAUSES HER TO HAVE BAD DREAMS  . Lisinopril     Coughing     Current Outpatient Prescriptions  Medication Sig Dispense Refill  . docusate sodium (COLACE) 100 MG capsule Take 200 mg by mouth at bedtime.    . gabapentin (NEURONTIN) 300 MG capsule Take 1 capsule (300 mg total) by mouth 3 (three) times daily. 90 capsule 5  . insulin glargine (LANTUS) 100 UNIT/ML injection Inject 22 Units into the skin at bedtime.    Marland Kitchen levothyroxine (SYNTHROID, LEVOTHROID) 112 MCG tablet Take 1 tablet (112 mcg total) by mouth daily. 90 tablet 3  . Linaclotide (LINZESS) 290 MCG CAPS capsule Take 1 capsule (290 mcg total) by mouth daily. 30  capsule 5  . losartan (COZAAR) 25 MG tablet Take 1 tablet (25 mg total) by mouth daily. 30 tablet 5  . metFORMIN (GLUCOPHAGE) 500 MG tablet Take 1 tablet (500 mg total) by mouth 2 (two) times daily with a meal. 180 tablet 3  . Multiple Vitamin (MULTIVITAMIN WITH MINERALS) TABS tablet Take 1 tablet by mouth daily.    Marland Kitchen NOVOLOG FLEXPEN 100 UNIT/ML FlexPen Inject 10-16 Units into the skin 3 (three) times daily with meals. 15 mL 2  . pravastatin (PRAVACHOL) 40 MG tablet Take 0.5 tablets (20 mg total) by mouth daily. 30 tablet 5  . SUMAtriptan (IMITREX) 100 MG tablet Take 1 tablet (100 mg total) by mouth every 2 (two) hours as needed. For migraines 10 tablet 3   No current facility-administered medications for this visit.     Past Surgical History  Procedure Laterality Date  . Wrist surgery Right   . Cystoscopy w/ ureteral stent placement Right 08/13/2013    Procedure: CYSTOSCOPY WITH RETROGRADE PYELOGRAM/URETERAL STENT PLACEMENT;  Surgeon: Marissa Nestle, MD;  Location: [facility];  Service: Urology;  Laterality: Right;  . Extracorporeal shock wave lithotripsy Right 08/19/2013    Procedure: EXTRACORPOREAL SHOCK WAVE LITHOTRIPSY (ESWL) RIGHT URETERAL CALCULUS;  Surgeon: Marissa Nestle, MD;  Location:  [facility];  Service: Urology;  Laterality: Right;  . Colonoscopy  2004    Dr. Jim Desanctis: small fissue and hemorrhoids  . Colonoscopy  12/2011    Dr. Paulita Fujita: normal     Allergies  Allergen Reactions  . Codeine Nausea Only and Other (See Comments)    NAUSEA AND CAUSES HER TO HAVE BAD DREAMS  . Lisinopril     Coughing      Family History  Problem Relation Age of Onset  . Colon cancer Maternal Grandmother     age 79  . Colon polyps Mother      Social History Ms. Hutcherson reports that she has never smoked. She has never used smokeless tobacco. Ms. Wiemer reports that she does not drink alcohol.   Review of Systems CONSTITUTIONAL: No weight loss, fever, chills, weakness or fatigue.    HEENT: Eyes: No visual loss, blurred vision, double vision or yellow sclerae.No hearing loss, sneezing, congestion, runny nose or sore throat.  SKIN: No rash or itching.  CARDIOVASCULAR: per hpi RESPIRATORY: No shortness of breath, cough or sputum.  GASTROINTESTINAL: No anorexia, nausea, vomiting or diarrhea. No abdominal pain or blood.  GENITOURINARY: No burning on urination, no polyuria NEUROLOGICAL: No headache, dizziness, syncope, paralysis, ataxia, numbness or tingling in the extremities. No change in bowel or bladder control.  MUSCULOSKELETAL: No muscle, back pain, joint pain or stiffness.  LYMPHATICS: No enlarged nodes. No history of splenectomy.  PSYCHIATRIC: No history of depression or anxiety.  ENDOCRINOLOGIC: No reports of sweating, cold or heat intolerance. No polyuria or polydipsia.  Marland Kitchen   Physical Examination Filed Vitals:   09/06/15 0840  BP: 126/80  Pulse: 81   Filed Vitals:   09/06/15 0840  Height: 5\' 2"  (1.575 m)  Weight: 192 lb (87.091 kg)    Gen: resting comfortably, no acute distress HEENT: no scleral icterus, pupils equal round and reactive, no palptable cervical adenopathy,  CV: RRR, 2/6 systolic murmur RUSB, no jvd Resp: Clear to auscultation bilaterally GI: abdomen is soft, non-tender, non-distended, normal bowel sounds, no hepatosplenomegaly MSK: extremities are warm, no edema.  Skin: warm, no rash Neuro:  no focal deficits Psych: appropriate affect   Diagnostic Studies 09/06/15 Clinic EKG (performed and reviewed in clinic): NSR    Assessment and Plan  1. Chest pain - unclear etiology. EKG in clinic without ischemic changes - will obtain GXT to further evaluate - start ASA 81mg  daily until workup complete  2. Heart murmur - will obtain echo  F/u pending test results    Arnoldo Lenis, M.D.

## 2015-09-06 NOTE — Patient Instructions (Signed)
Your physician recommends that you schedule a follow-up appointment TO BE DETERMINED AFTER TESTING  Your physician has recommended you make the following change in your medication:   START ASPIRIN 54 MG DAILY  Your physician has requested that you have an echocardiogram. Echocardiography is a painless test that uses sound waves to create images of your heart. It provides your doctor with information about the size and shape of your heart and how well your heart's chambers and valves are working. This procedure takes approximately one hour. There are no restrictions for this procedure.   Thank you for choosing [facility]!!

## 2015-09-08 ENCOUNTER — Ambulatory Visit: Payer: 59 | Admitting: Pediatrics

## 2015-09-08 ENCOUNTER — Other Ambulatory Visit: Payer: Self-pay

## 2015-09-08 ENCOUNTER — Ambulatory Visit (INDEPENDENT_AMBULATORY_CARE_PROVIDER_SITE_OTHER): Payer: 59

## 2015-09-08 DIAGNOSIS — R011 Cardiac murmur, unspecified: Secondary | ICD-10-CM | POA: Diagnosis not present

## 2015-09-13 ENCOUNTER — Telehealth: Payer: Self-pay | Admitting: *Deleted

## 2015-09-13 ENCOUNTER — Encounter: Payer: Self-pay | Admitting: *Deleted

## 2015-09-13 ENCOUNTER — Other Ambulatory Visit (HOSPITAL_COMMUNITY)
Admission: RE | Admit: 2015-09-13 | Discharge: 2015-09-13 | Disposition: A | Discharge status: Home / Self Care | Payer: 59 | Source: Ambulatory Visit | Attending: "Endocrinology | Admitting: "Endocrinology

## 2015-09-13 DIAGNOSIS — Z794 Long term (current) use of insulin: Secondary | ICD-10-CM | POA: Insufficient documentation

## 2015-09-13 DIAGNOSIS — R079 Chest pain, unspecified: Secondary | ICD-10-CM

## 2015-09-13 DIAGNOSIS — E1165 Type 2 diabetes mellitus with hyperglycemia: Secondary | ICD-10-CM | POA: Insufficient documentation

## 2015-09-13 DIAGNOSIS — E118 Type 2 diabetes mellitus with unspecified complications: Secondary | ICD-10-CM | POA: Diagnosis not present

## 2015-09-13 DIAGNOSIS — E038 Other specified hypothyroidism: Secondary | ICD-10-CM | POA: Insufficient documentation

## 2015-09-13 LAB — T4, FREE: Free T4: 1.05 ng/dL (ref 0.61–1.12)

## 2015-09-13 LAB — BASIC METABOLIC PANEL
Anion gap: 9 (ref 5–15)
BUN: 20 mg/dL (ref 6–20)
CHLORIDE: 104 mmol/L (ref 101–111)
CO2: 27 mmol/L (ref 22–32)
CREATININE: 0.74 mg/dL (ref 0.44–1.00)
Calcium: 9.5 mg/dL (ref 8.9–10.3)
GFR calc Af Amer: >60 mL/min (ref >60)
GFR calc non Af Amer: >60 mL/min (ref >60)
Glucose, Bld: 183 mg/dL — ABNORMAL HIGH (ref 65–99)
POTASSIUM: 4.5 mmol/L (ref 3.5–5.1)
SODIUM: 140 mmol/L (ref 135–145)

## 2015-09-13 LAB — TSH: TSH: 2.377 u[IU]/mL (ref 0.350–4.500)

## 2015-09-13 NOTE — Telephone Encounter (Signed)
-----   Message from Arnoldo Lenis, MD sent at 09/09/2015  1:51 PM EST ----- Echo looks good, normal heart function and normal valve function   Zandra Abts MD

## 2015-09-13 NOTE — Telephone Encounter (Signed)
LM X 2 days, mailed pt letter, routed to pcp

## 2015-09-14 LAB — HEMOGLOBIN A1C
HEMOGLOBIN A1C: 9.3 % — AB (ref 4.8–5.6)
Mean Plasma Glucose: 220 mg/dL

## 2015-09-14 NOTE — Telephone Encounter (Signed)
Orders placed per Dr. Harl Bowie for GXT. Will forward to schedulers.

## 2015-09-15 ENCOUNTER — Encounter: Payer: Self-pay | Admitting: *Deleted

## 2015-09-15 ENCOUNTER — Telehealth: Payer: Self-pay | Admitting: *Deleted

## 2015-09-15 NOTE — Telephone Encounter (Signed)
-----   Message from Chanda Busing sent at 09/14/2015  2:34 PM EST ----- i have this scheduled for 09/21/15 @ 845AM   They will need to arrive at 745 am for registration  Go to the main desk for registration.   ----- Message -----    From: Massie Maroon, CMA    Sent: 09/14/2015   1:44 PM      To: Chanda Busing  Can you please schedule GXT for chest pain for this pt. I have tried to call her already. Let me know if you get her. Thank you.  Hessie Varone

## 2015-09-15 NOTE — Telephone Encounter (Signed)
Pt aware and confirmed GXT for next Wednesday. Mailed pt instruction letter.

## 2015-09-16 ENCOUNTER — Ambulatory Visit: Payer: 59 | Admitting: "Endocrinology

## 2015-09-21 ENCOUNTER — Inpatient Hospital Stay (HOSPITAL_COMMUNITY): Admission: RE | Admit: 2015-09-21 | Payer: 59 | Source: Ambulatory Visit

## 2015-09-23 ENCOUNTER — Ambulatory Visit: Payer: 59 | Admitting: Pediatrics

## 2015-11-01 ENCOUNTER — Encounter: Payer: Self-pay | Admitting: Internal Medicine

## 2015-11-01 ENCOUNTER — Other Ambulatory Visit: Payer: Self-pay | Admitting: "Endocrinology

## 2015-11-07 ENCOUNTER — Encounter: Payer: Self-pay | Admitting: "Endocrinology

## 2015-11-07 ENCOUNTER — Ambulatory Visit (INDEPENDENT_AMBULATORY_CARE_PROVIDER_SITE_OTHER): Payer: BLUE CROSS/BLUE SHIELD | Admitting: "Endocrinology

## 2015-11-07 VITALS — BP 114/75 | HR 70 | Ht 62.0 in

## 2015-11-07 DIAGNOSIS — E785 Hyperlipidemia, unspecified: Secondary | ICD-10-CM

## 2015-11-07 DIAGNOSIS — IMO0002 Reserved for concepts with insufficient information to code with codable children: Secondary | ICD-10-CM

## 2015-11-07 DIAGNOSIS — E118 Type 2 diabetes mellitus with unspecified complications: Secondary | ICD-10-CM

## 2015-11-07 DIAGNOSIS — Z794 Long term (current) use of insulin: Secondary | ICD-10-CM | POA: Diagnosis not present

## 2015-11-07 DIAGNOSIS — E1159 Type 2 diabetes mellitus with other circulatory complications: Secondary | ICD-10-CM | POA: Insufficient documentation

## 2015-11-07 DIAGNOSIS — E1165 Type 2 diabetes mellitus with hyperglycemia: Secondary | ICD-10-CM | POA: Diagnosis not present

## 2015-11-07 DIAGNOSIS — E039 Hypothyroidism, unspecified: Secondary | ICD-10-CM | POA: Insufficient documentation

## 2015-11-07 DIAGNOSIS — E038 Other specified hypothyroidism: Secondary | ICD-10-CM

## 2015-11-07 DIAGNOSIS — I10 Essential (primary) hypertension: Secondary | ICD-10-CM

## 2015-11-07 DIAGNOSIS — I152 Hypertension secondary to endocrine disorders: Secondary | ICD-10-CM | POA: Insufficient documentation

## 2015-11-07 MED ORDER — METFORMIN HCL 500 MG PO TABS: 500.0000 mg | ORAL_TABLET | Freq: Two times a day (BID) | ORAL | Status: DC

## 2015-11-07 NOTE — Patient Instructions (Signed)

## 2015-11-07 NOTE — Progress Notes (Signed)
Subjective:    Patient ID: Melissa Schneider, female    DOB: 12-08-1955, PCP Eustaquio Maize, MD   Past Medical History  Diagnosis Date  . Diabetes mellitus   . Hearing difficulty   . Diabetic gastroparesis ([place]) 07/23/2011    Suspected  . Anemia 07/23/2011  . Microcytic anemia 07/23/2011  . Headache(784.0)   . Hypothyroidism   . Hyperlipidemia   . Kidney stones   . Anxiety   . Thalassemia minor     ????? not really worked up per pt but daughter has it???   Past Surgical History  Procedure Laterality Date  . Wrist surgery Right   . Cystoscopy w/ ureteral stent placement Right 08/13/2013    Procedure: CYSTOSCOPY WITH RETROGRADE PYELOGRAM/URETERAL STENT PLACEMENT;  Surgeon: Marissa Nestle, MD;  Location: [facility];  Service: Urology;  Laterality: Right;  . Extracorporeal shock wave lithotripsy Right 08/19/2013    Procedure: EXTRACORPOREAL SHOCK WAVE LITHOTRIPSY (ESWL) RIGHT URETERAL CALCULUS;  Surgeon: Marissa Nestle, MD;  Location: [facility];  Service: Urology;  Laterality: Right;  . Colonoscopy  2004    Dr. Jim Desanctis: small fissue and hemorrhoids  . Colonoscopy  12/2011    Dr. Paulita Fujita: normal   Social History   Social History  . Marital Status: Married    Spouse Name: N/A  . Number of Children: 1  . Years of Education: N/A   Occupational History  . transcriptionist    Social History Main Topics  . Smoking status: Never Smoker   . Smokeless tobacco: Never Used     Comment: Never smoked  . Alcohol Use: No  . Drug Use: No  . Sexual Activity: Not Asked   Other Topics Concern  . None   Social History Narrative   Outpatient Encounter Prescriptions as of 11/07/2015  Medication Sig  . Insulin Glargine (LANTUS SOLOSTAR [place]) Inject 25 Units into the skin at bedtime.  . metFORMIN (GLUCOPHAGE) 500 MG tablet Take 1 tablet (500 mg total) by mouth 2 (two) times daily with a meal.  . [DISCONTINUED] metFORMIN (GLUCOPHAGE) 500 MG tablet Take 500 mg by mouth 2 (two) times daily with  a meal.  . aspirin 81 MG tablet Take 81 mg by mouth daily.  Marland Kitchen docusate sodium (COLACE) 100 MG capsule Take 200 mg by mouth at bedtime.  . gabapentin (NEURONTIN) 300 MG capsule Take 1 capsule (300 mg total) by mouth 3 (three) times daily.  Marland Kitchen levothyroxine (SYNTHROID, LEVOTHROID) 112 MCG tablet Take 1 tablet (112 mcg total) by mouth daily.  . Linaclotide (LINZESS) 290 MCG CAPS capsule Take 1 capsule (290 mcg total) by mouth daily.  Marland Kitchen losartan (COZAAR) 25 MG tablet Take 1 tablet (25 mg total) by mouth daily.  . Multiple Vitamin (MULTIVITAMIN WITH MINERALS) TABS tablet Take 1 tablet by mouth daily.  Marland Kitchen NOVOLOG FLEXPEN 100 UNIT/ML FlexPen Inject 10-16 Units into the skin 3 (three) times daily with meals.  . pravastatin (PRAVACHOL) 40 MG tablet Take 0.5 tablets (20 mg total) by mouth daily.  . SUMAtriptan (IMITREX) 100 MG tablet Take 1 tablet (100 mg total) by mouth every 2 (two) hours as needed. For migraines  . [DISCONTINUED] LANTUS SOLOSTAR 100 UNIT/ML Solostar Pen INJECT 22 UNITS SQ AT BEDTIME  . [DISCONTINUED] metFORMIN (GLUCOPHAGE) 500 MG tablet Take 1 tablet (500 mg total) by mouth 2 (two) times daily with a meal.   No facility-administered encounter medications on file as of 11/07/2015.   ALLERGIES: Allergies  Allergen Reactions  .  Codeine Nausea Only and Other (See Comments)    NAUSEA AND CAUSES HER TO HAVE BAD DREAMS  . Lisinopril     Coughing   VACCINATION STATUS: Immunization History  Administered Date(s) Administered  . Influenza Whole 05/16/2010  . Influenza,inj,Quad PF,36+ Mos 05/10/2015  . Pneumococcal Polysaccharide-23 07/16/2008  . Td 07/17/2007    Diabetes She presents for her follow-up diabetic visit. She has type 2 diabetes mellitus. Onset time: She was diagnosed at approximate age of 5 years. Her disease course has been worsening. There are no hypoglycemic associated symptoms. Pertinent negatives for hypoglycemia include no confusion, headaches, pallor or seizures.  Associated symptoms include polydipsia and polyuria. Pertinent negatives for diabetes include no chest pain and no polyphagia. There are no hypoglycemic complications. Symptoms are worsening. There are no diabetic complications. Risk factors for coronary artery disease include dyslipidemia, diabetes mellitus and sedentary lifestyle. Current diabetic treatment includes intensive insulin program and oral agent (monotherapy). She is compliant with treatment most of the time. Weight trend: Does not want to weigh herself today. She is following a generally unhealthy diet. She has had a previous visit with a dietitian. She never participates in exercise. Home blood sugar record trend: She did not bring the meter nor logs to review today. Her A1c is still high at 9.3%. (She did not bring the meter nor log book to review. ) An ACE inhibitor/angiotensin II receptor blocker is being taken.  Hyperlipidemia This is a chronic problem. The current episode started more than 1 year ago. Pertinent negatives include no chest pain, myalgias or shortness of breath. Current antihyperlipidemic treatment includes statins.  Hypertension This is a chronic problem. The current episode started more than 1 year ago. Pertinent negatives include no chest pain, headaches or shortness of breath. Past treatments include angiotensin blockers.     Review of Systems  Constitutional: Negative for unexpected weight change.  HENT: Negative for trouble swallowing and voice change.   Eyes: Negative for visual disturbance.  Respiratory: Negative for cough, shortness of breath and wheezing.   Cardiovascular: Negative for chest pain and leg swelling.  Gastrointestinal: Negative for nausea and vomiting.  Endocrine: Positive for polydipsia and polyuria. Negative for polyphagia.  Musculoskeletal: Negative for myalgias and arthralgias.  Skin: Negative for color change, pallor, rash and wound.  Neurological: Negative for seizures and headaches.   Psychiatric/Behavioral: Negative for suicidal ideas and confusion.    Objective:    BP 114/75 mmHg  Pulse 70  Ht 5\' 2"  (1.575 m)  SpO2 98%  Wt Readings from Last 3 Encounters:  09/06/15 192 lb (87.091 kg)  08/10/15 191 lb 12.8 oz (87 kg)  04/29/15 182 lb 6.4 oz (82.736 kg)    Physical Exam  Constitutional: She is oriented to person, place, and time. She appears well-developed.  HENT:  Head: Normocephalic and atraumatic.  Eyes: EOM are normal.  Neck: Normal range of motion. Neck supple. No tracheal deviation present. No thyromegaly present.  Cardiovascular: Normal rate and regular rhythm.   Pulmonary/Chest: Effort normal and breath sounds normal.  Abdominal: Soft. Bowel sounds are normal. There is no tenderness. There is no guarding.  Musculoskeletal: Normal range of motion. She exhibits no edema.  Neurological: She is alert and oriented to person, place, and time. She has normal reflexes. No cranial nerve deficit. Coordination normal.  Skin: Skin is warm and dry. No rash noted. No erythema. No pallor.  Psychiatric: She has a normal mood and affect. Judgment normal.    Results for orders placed  or performed during the hospital encounter of 09/13/15  TSH  Result Value Ref Range   TSH 2.377 0.350 - 4.500 uIU/mL  T4, free  Result Value Ref Range   Free T4 1.05 0.61 - 1.12 ng/dL  Basic metabolic panel  Result Value Ref Range   Sodium 140 135 - 145 mmol/L   Potassium 4.5 3.5 - 5.1 mmol/L   Chloride 104 101 - 111 mmol/L   CO2 27 22 - 32 mmol/L   Glucose, Bld 183 (H) 65 - 99 mg/dL   BUN 20 6 - 20 mg/dL   Creatinine, Ser 0.74 0.44 - 1.00 mg/dL   Calcium 9.5 8.9 - 10.3 mg/dL   GFR calc non Af Amer >60 >60 mL/min   GFR calc Af Amer >60 >60 mL/min   Anion gap 9 5 - 15  Hemoglobin A1c  Result Value Ref Range   Hgb A1c MFr Bld 9.3 (H) 4.8 - 5.6 %   Mean Plasma Glucose 220 mg/dL   Complete Blood Count (Most recent): Lab Results  Component Value Date   WBC 6.5 11/09/2014    HGB 12.7 11/09/2014   HCT 39.7 11/09/2014   HCT 39.8 11/09/2014   MCV 67.6* 11/09/2014   PLT 261 11/09/2014   Chemistry (most recent): Lab Results  Component Value Date   NA 140 09/13/2015   K 4.5 09/13/2015   CL 104 09/13/2015   CO2 27 09/13/2015   BUN 20 09/13/2015   CREATININE 0.74 09/13/2015   Diabetic Labs (most recent): Lab Results  Component Value Date   HGBA1C 9.3* 09/13/2015   HGBA1C 9.2* 06/08/2015   HGBA1C 8.7* 03/09/2015   Lipid profile (most recent): Lab Results  Component Value Date   TRIG 61 03/17/2015   CHOL 215* 03/17/2015     Assessment & Plan:   1. Uncontrolled type 2 diabetes mellitus with complication, with long-term current use of insulin ([place]) -She  remains at a high risk for more acute and chronic complications of diabetes which include CAD, CVA, CKD, retinopathy, and neuropathy. These are all discussed in detail with the patient.  Patient came with out her meter nor glucose profile, but  recent A1c increased to 9.3 %.    Recent labs reviewed.   - I have re-counseled the patient on diet management   by adopting a carbohydrate restricted / protein rich  Diet.  - Suggestion is made for patient to avoid simple carbohydrates   from their diet including Cakes , Desserts, Ice Cream,  Soda (  diet and regular) , Sweet Tea , Candies,  Chips, Cookies, Artificial Sweeteners,   and "Sugar-free" Products .  This will help patient to have stable blood glucose profile and potentially avoid unintended  Weight gain.  - Patient is advised to stick to a routine mealtimes to eat 3 meals  a day and avoid unnecessary snacks ( to snack only to correct hypoglycemia).  - She has seen a dietitian before.  - I have approached patient with the following individualized plan to manage diabetes and patient agrees.  - I advised her to be more consistent in meal timing and portion size, will continue  Lantus at  22 units qhs,   Novolog  10 units TIDAC plus SSI. She will call  me if she has hypoglycemia below 70.  She will continue MTF 500mg  po BID.  - Patient will be considered for incretin therapy as appropriate next visit. - Patient specific target  for A1c; LDL, HDL, Triglycerides, and  Waist Circumference were discussed in detail.  2) BP/HTN: Controlled. Continue current medications including ACEI/ARB. 3) Lipids/HPL:  continue statins. 4)  Weight/Diet: CDE consult in progress, exercise, and carbohydrates information provided.  5) hypothyroidism: She is clinically euthyroid. I advised her to continue levothyroxine 112 g by mouth every morning.   - We discussed about correct intake of levothyroxine, at fasting, with water, separated by at least 30 minutes from breakfast, and separated by more than 4 hours from calcium, iron, multivitamins, acid reflux medications (PPIs). -Patient is made aware of the fact that thyroid hormone replacement is needed for life, dose to be adjusted by periodic monitoring of thyroid function tests.   6) Chronic Care/Health Maintenance:  -Patient  on ACEI/ARB and Statin medications and encouraged to continue to follow up with Ophthalmology, Podiatrist at least yearly or according to recommendations, and advised to  stay away from smoking. I have recommended yearly flu vaccine and pneumonia vaccination at least every 5 years; moderate intensity exercise for up to 150 minutes weekly; and  sleep for at least 7 hours a day.  - 25 minutes of time was spent on the care of this patient , 50% of which was applied for counseling on diabetes complications and their preventions.  - I advised patient to maintain close follow up with Eustaquio Maize, MD for primary care needs.  Patient is asked to bring meter and  blood glucose logs during their next visit.   Follow up plan: -Return in about 8 weeks (around 01/02/2016) for diabetes, high blood pressure, high cholesterol, follow up with pre-visit labs, meter, and logs.  Glade Lloyd, MD Phone:  [phone]  Fax: [phone]   11/07/2015, 4:24 PM

## 2015-12-14 ENCOUNTER — Other Ambulatory Visit: Payer: Self-pay | Admitting: Family

## 2015-12-19 ENCOUNTER — Other Ambulatory Visit: Payer: Self-pay | Admitting: Pediatrics

## 2015-12-30 ENCOUNTER — Other Ambulatory Visit: Payer: Self-pay | Admitting: "Endocrinology

## 2016-01-04 ENCOUNTER — Telehealth: Payer: Self-pay | Admitting: *Deleted

## 2016-01-04 NOTE — Telephone Encounter (Signed)
Needs to reschedule stress test that was initially set for March.  Did not have insurance at that time.  Now has insurance ([place]).  Dr. Harl Bowie patient.

## 2016-01-16 ENCOUNTER — Ambulatory Visit: Payer: 59 | Admitting: "Endocrinology

## 2016-01-19 ENCOUNTER — Telehealth: Payer: Self-pay | Admitting: Cardiology

## 2016-01-19 NOTE — Telephone Encounter (Signed)
GXT scheduled for 01/23/16 at [facility]

## 2016-01-19 NOTE — Telephone Encounter (Signed)
No precert for GXT

## 2016-01-23 ENCOUNTER — Ambulatory Visit (HOSPITAL_COMMUNITY)
Admission: RE | Admit: 2016-01-23 | Discharge: 2016-01-23 | Disposition: A | Discharge status: Home / Self Care | Payer: BLUE CROSS/BLUE SHIELD | Source: Ambulatory Visit | Attending: Cardiology | Admitting: Cardiology

## 2016-01-23 DIAGNOSIS — R079 Chest pain, unspecified: Secondary | ICD-10-CM

## 2016-01-23 LAB — EXERCISE TOLERANCE TEST
CHL CUP RESTING HR STRESS: 65 {beats}/min
CHL RATE OF PERCEIVED EXERTION: 16
CSEPED: 4 min
CSEPEW: 7 METS
CSEPPHR: 141 {beats}/min
Exercise duration (sec): 16 s
MPHR: 161 {beats}/min
Percent HR: 87 %

## 2016-01-30 ENCOUNTER — Ambulatory Visit (INDEPENDENT_AMBULATORY_CARE_PROVIDER_SITE_OTHER): Payer: 59 | Admitting: Pediatrics

## 2016-01-30 ENCOUNTER — Encounter: Payer: Self-pay | Admitting: Pediatrics

## 2016-01-30 VITALS — BP 114/75 | HR 70 | Temp 97.6°F | Ht 62.0 in | Wt 188.6 lb

## 2016-01-30 DIAGNOSIS — R079 Chest pain, unspecified: Secondary | ICD-10-CM

## 2016-01-30 DIAGNOSIS — E118 Type 2 diabetes mellitus with unspecified complications: Secondary | ICD-10-CM

## 2016-01-30 DIAGNOSIS — H9193 Unspecified hearing loss, bilateral: Secondary | ICD-10-CM | POA: Insufficient documentation

## 2016-01-30 DIAGNOSIS — E1165 Type 2 diabetes mellitus with hyperglycemia: Secondary | ICD-10-CM

## 2016-01-30 DIAGNOSIS — Z6835 Body mass index (BMI) 35.0-35.9, adult: Secondary | ICD-10-CM

## 2016-01-30 DIAGNOSIS — I10 Essential (primary) hypertension: Secondary | ICD-10-CM

## 2016-01-30 DIAGNOSIS — E785 Hyperlipidemia, unspecified: Secondary | ICD-10-CM

## 2016-01-30 DIAGNOSIS — E1142 Type 2 diabetes mellitus with diabetic polyneuropathy: Secondary | ICD-10-CM

## 2016-01-30 DIAGNOSIS — K219 Gastro-esophageal reflux disease without esophagitis: Secondary | ICD-10-CM

## 2016-01-30 DIAGNOSIS — K5900 Constipation, unspecified: Secondary | ICD-10-CM | POA: Diagnosis not present

## 2016-01-30 DIAGNOSIS — Z1159 Encounter for screening for other viral diseases: Secondary | ICD-10-CM

## 2016-01-30 DIAGNOSIS — Z23 Encounter for immunization: Secondary | ICD-10-CM

## 2016-01-30 DIAGNOSIS — IMO0002 Reserved for concepts with insufficient information to code with codable children: Secondary | ICD-10-CM

## 2016-01-30 DIAGNOSIS — Z794 Long term (current) use of insulin: Secondary | ICD-10-CM

## 2016-01-30 LAB — BAYER DCA HB A1C WAIVED: HB A1C: 8.8 % — AB (ref <7.0)

## 2016-01-30 MED ORDER — FAMOTIDINE 20 MG PO TABS: 20.0000 mg | ORAL_TABLET | Freq: Two times a day (BID) | ORAL | Status: DC

## 2016-01-30 NOTE — Progress Notes (Signed)
Subjective:    Patient ID: Melissa Schneider, female    DOB: February 13, 1956, 60 y.o.   MRN: [id number]  CC: Follow-up med problems  HPI: Melissa Schneider is a 60 y.o. female presenting for Follow-up  Sometimes feels like food gets caught in upper chest She stops eating and then it goes down  Occasionally, apprx 1x per week still with chest pain when she lies down at night No SOB or nausea with it Improves with taking aspirin and she is able to sleep She does sometimes have SOB walking up the basement stairs but otherwise no trouble with exertion  Neuropathy: still with burning in feet Uses heat cream and socks at night Gabapentin makes her feel loopy when she takes it during the day  Constipation: still a problem Takes linzess and stool softeners Paying more for medicines now, not able to afford linzess   Depression screen [facility] 2/9 01/30/2016 11/07/2015 08/10/2015 04/29/2015 03/17/2015  Decreased Interest 0 0 1 0 0  Down, Depressed, Hopeless 0 0 1 0 0  PHQ - 2 Score 0 0 2 0 0  Altered sleeping - - 1 - -  Tired, decreased energy - - 1 - -  Change in appetite - - 0 - -  Feeling bad or failure about yourself  - - 0 - -  Trouble concentrating - - 0 - -  Moving slowly or fidgety/restless - - 0 - -  Suicidal thoughts - - 0 - -  PHQ-9 Score - - 4 - -     Relevant past medical, surgical, family and social history reviewed and updated as indicated.  Interim medical history since our last visit reviewed. Allergies and medications reviewed and updated.  ROS: Per HPI unless specifically indicated above     Objective:    BP 114/75 mmHg  Pulse 70  Temp(Src) 97.6 F (36.4 C) (Oral)  Ht '5\' 2"'  (1.575 m)  Wt 188 lb 9.6 oz (85.548 kg)  BMI 34.49 kg/m2  Wt Readings from Last 3 Encounters:  01/30/16 188 lb 9.6 oz (85.548 kg)  09/06/15 192 lb (87.091 kg)  08/10/15 191 lb 12.8 oz (87 kg)     Gen: NAD, alert, cooperative with exam, NCAT EYES: EOMI, no scleral injection or  icterus CV: NRRR, normal S1/S2,  distal pulses 2+ b/l Resp: CTABL, no wheezes, normal WOB Abd: +BS, soft, NTND. no guarding or organomegaly Ext: No edema, warm Neuro: Alert and oriented, strength equal b/l UE and LE, coordination grossly normal MSK: normal muscle bulk     Assessment & Plan:    [name] was seen today for follow-up multiple med problems.  Diagnoses and all orders for this visit:  Chest pain, unspecified chest pain type Occurs primarily at night apprx 1x a week now ASA helps, takes 9m daily, takes 3269mwhen having the pain Then is able to go to sleep Recent stress test normal Possible this is MSK vs GER Will do trial of famotidine -     famotidine (PEPCID) 20 MG tablet  Diabetic polyneuropathy associated with type 2 diabetes mellitus ([facility]) Increase PM dose of gabapentin to 60040mot taking it during the day now, just at night  Type 2 diabetes mellitus with complication, with long-term current use of insulin [place] Follows with Dr. NidErskine Emeryday per Dr. NidDorris Fetch    Bayer DCAWaller A1c Waived -     CMP14+EGFR  Essential hypertension, benign Well controlled today, cont  meds  Constipation, unspecified constipation type Cont linzess  Hyperlipidemia On pravastatin  BMI 35.0-35.9,adult Cont avoiding simple carbohydrates, sugar  Hearing problem of both ears Hearing is worse, qualifying for disability through work per pt  Need for hepatitis C screening test -     Hepatitis C antibody    Follow up plan: Return in about 6 months (around 08/01/2016).  [name], MD [facility] 01/30/2016, 9:22 AM

## 2016-01-31 LAB — CMP14+EGFR
ALBUMIN: 4.3 g/dL (ref 3.5–5.5)
ALT: 13 IU/L (ref 0–32)
AST: 14 IU/L (ref 0–40)
Albumin/Globulin Ratio: 1.6 (ref 1.2–2.2)
Alkaline Phosphatase: 113 IU/L (ref 39–117)
BILIRUBIN TOTAL: 0.3 mg/dL (ref 0.0–1.2)
BUN / CREAT RATIO: 22 (ref 9–23)
BUN: 18 mg/dL (ref 6–24)
CALCIUM: 9.7 mg/dL (ref 8.7–10.2)
CO2: 22 mmol/L (ref 18–29)
Chloride: 97 mmol/L (ref 96–106)
Creatinine, Ser: 0.81 mg/dL (ref 0.57–1.00)
GFR, EST AFRICAN AMERICAN: 92 mL/min/{1.73_m2} (ref >59)
GFR, EST NON AFRICAN AMERICAN: 80 mL/min/{1.73_m2} (ref >59)
GLUCOSE: 245 mg/dL — AB (ref 65–99)
Globulin, Total: 2.7 g/dL (ref 1.5–4.5)
Potassium: 5 mmol/L (ref 3.5–5.2)
Sodium: 138 mmol/L (ref 134–144)
TOTAL PROTEIN: 7 g/dL (ref 6.0–8.5)

## 2016-01-31 LAB — HEPATITIS C ANTIBODY: Hep C Virus Ab: <0.1 s/co ratio (ref 0.0–0.9)

## 2016-02-02 ENCOUNTER — Telehealth: Payer: Self-pay | Admitting: *Deleted

## 2016-02-02 ENCOUNTER — Encounter: Payer: Self-pay | Admitting: *Deleted

## 2016-02-02 NOTE — Telephone Encounter (Signed)
-----   Message from Arnoldo Lenis, MD sent at 01/24/2016 12:54 PM EDT ----- Stress test is normal, no evidence of any blockages in the arteries of her heart. Overall her heart testing has looked good, including her normal echo in February. Her symptoms do not appear to be heart related, she needs to f/u with her pcp to discuss nonheart causes of chest pain. Can f/u with [place] in 4 months  Zandra Abts MD

## 2016-02-02 NOTE — Telephone Encounter (Signed)
LM X3 days mailed. Pt letter - routed to pcp - recall placed

## 2016-02-13 ENCOUNTER — Ambulatory Visit (INDEPENDENT_AMBULATORY_CARE_PROVIDER_SITE_OTHER): Payer: BLUE CROSS/BLUE SHIELD | Admitting: "Endocrinology

## 2016-02-13 ENCOUNTER — Encounter: Payer: Self-pay | Admitting: "Endocrinology

## 2016-02-13 VITALS — BP 128/70 | HR 66 | Ht 62.0 in

## 2016-02-13 DIAGNOSIS — Z794 Long term (current) use of insulin: Secondary | ICD-10-CM | POA: Diagnosis not present

## 2016-02-13 DIAGNOSIS — E1165 Type 2 diabetes mellitus with hyperglycemia: Secondary | ICD-10-CM | POA: Diagnosis not present

## 2016-02-13 DIAGNOSIS — E118 Type 2 diabetes mellitus with unspecified complications: Secondary | ICD-10-CM | POA: Diagnosis not present

## 2016-02-13 DIAGNOSIS — I10 Essential (primary) hypertension: Secondary | ICD-10-CM | POA: Diagnosis not present

## 2016-02-13 DIAGNOSIS — E038 Other specified hypothyroidism: Secondary | ICD-10-CM

## 2016-02-13 DIAGNOSIS — IMO0002 Reserved for concepts with insufficient information to code with codable children: Secondary | ICD-10-CM

## 2016-02-13 DIAGNOSIS — E785 Hyperlipidemia, unspecified: Secondary | ICD-10-CM | POA: Diagnosis not present

## 2016-02-13 NOTE — Progress Notes (Signed)
Subjective:    Patient ID: Melissa Schneider, female    DOB: Sep 12, 1955, PCP Eustaquio Maize, MD   Past Medical History:  Diagnosis Date  . Anemia 07/23/2011  . Anxiety   . Diabetes mellitus   . Diabetic gastroparesis ([place]) 07/23/2011   Suspected  . Headache(784.0)   . Hearing difficulty   . Hyperlipidemia   . Hypothyroidism   . Kidney stones   . Microcytic anemia 07/23/2011  . Thalassemia minor    ????? not really worked up per pt but daughter has it???   Past Surgical History:  Procedure Laterality Date  . COLONOSCOPY  2004   Dr. Jim Desanctis: small fissue and hemorrhoids  . COLONOSCOPY  12/2011   Dr. Paulita Fujita: normal  . CYSTOSCOPY W/ URETERAL STENT PLACEMENT Right 08/13/2013   Procedure: CYSTOSCOPY WITH RETROGRADE PYELOGRAM/URETERAL STENT PLACEMENT;  Surgeon: Marissa Nestle, MD;  Location: [facility];  Service: Urology;  Laterality: Right;  . EXTRACORPOREAL SHOCK WAVE LITHOTRIPSY Right 08/19/2013   Procedure: EXTRACORPOREAL SHOCK WAVE LITHOTRIPSY (ESWL) RIGHT URETERAL CALCULUS;  Surgeon: Marissa Nestle, MD;  Location: [facility];  Service: Urology;  Laterality: Right;  . WRIST SURGERY Right    Social History   Social History  . Marital status: Married    Spouse name: N/A  . Number of children: 1  . Years of education: N/A   Occupational History  . transcriptionist [employer] History Main Topics  . Smoking status: Never Smoker  . Smokeless tobacco: Never Used     Comment: Never smoked  . Alcohol use No  . Drug use: No  . Sexual activity: Not Asked   Other Topics Concern  . None   Social History Narrative  . None   Outpatient Encounter Prescriptions as of 02/13/2016  Medication Sig  . aspirin 81 MG tablet Take 81 mg by mouth daily.  Marland Kitchen docusate sodium (COLACE) 100 MG capsule Take 200 mg by mouth at bedtime.  . famotidine (PEPCID) 20 MG tablet Take 1 tablet (20 mg total) by mouth 2 (two) times daily.  Marland Kitchen gabapentin (NEURONTIN) 300 MG capsule Take  1 capsule (300 mg total) by mouth 3 (three) times daily.  . Insulin Glargine (LANTUS SOLOSTAR [place]) Inject 25 Units into the skin at bedtime.  Marland Kitchen levothyroxine (SYNTHROID, LEVOTHROID) 112 MCG tablet TAKE ONE (1) TABLET EACH DAY  . Linaclotide (LINZESS) 290 MCG CAPS capsule Take 1 capsule (290 mcg total) by mouth daily.  Marland Kitchen losartan (COZAAR) 25 MG tablet TAKE ONE (1) TABLET EACH DAY  . metFORMIN (GLUCOPHAGE) 500 MG tablet Take 1 tablet (500 mg total) by mouth 2 (two) times daily with a meal.  . Multiple Vitamin (MULTIVITAMIN WITH MINERALS) TABS tablet Take 1 tablet by mouth daily.  Marland Kitchen NOVOLOG FLEXPEN 100 UNIT/ML FlexPen INJECT 10-17 UNITS SQ 3 TIMES DAILY WITH MEALS  . pravastatin (PRAVACHOL) 20 MG tablet TAKE ONE (1) TABLET EACH DAY  . SUMAtriptan (IMITREX) 100 MG tablet TAKE 1 TABLET AT ONSET OF MIGRAINE HEADACHE MAY REPEAT ONCE IN 2 HOURS (MAX OF 2 TABLETS/24 HOURS)   No facility-administered encounter medications on file as of 02/13/2016.    ALLERGIES: Allergies  Allergen Reactions  . Codeine Nausea Only and Other (See Comments)    NAUSEA AND CAUSES HER TO HAVE BAD DREAMS  . Lisinopril     Coughing   VACCINATION STATUS: Immunization History  Administered Date(s) Administered  . Influenza Whole 05/16/2010  . Influenza,inj,Quad PF,36+ Mos 05/10/2015  .  Pneumococcal Polysaccharide-23 07/16/2008, 01/30/2016  . Td 07/17/2007    Diabetes  She presents for her follow-up diabetic visit. She has type 2 diabetes mellitus. Onset time: She was diagnosed at approximate age of 49 years. Her disease course has been improving. There are no hypoglycemic associated symptoms. Pertinent negatives for hypoglycemia include no confusion, headaches, pallor or seizures. Associated symptoms include polydipsia and polyuria. Pertinent negatives for diabetes include no chest pain and no polyphagia. There are no hypoglycemic complications. Symptoms are improving. There are no diabetic complications. Risk factors for  coronary artery disease include dyslipidemia, diabetes mellitus and sedentary lifestyle. Current diabetic treatment includes intensive insulin program and oral agent (monotherapy). She is compliant with treatment most of the time. Her weight is stable (Does not want to weigh herself today.). She is following a generally unhealthy diet. She has had a previous visit with a dietitian. She never participates in exercise. Home blood sugar record trend: She did not bring the meter nor logs to review today. Her A1c is still high at 9.3%. Her breakfast blood glucose range is generally 140-180 mg/dl. Her lunch blood glucose range is generally 140-180 mg/dl. Her dinner blood glucose range is generally 140-180 mg/dl. Her overall blood glucose range is 140-180 mg/dl. ( ) An ACE inhibitor/angiotensin II receptor blocker is being taken.  Hyperlipidemia  This is a chronic problem. The current episode started more than 1 year ago. Pertinent negatives include no chest pain, myalgias or shortness of breath. Current antihyperlipidemic treatment includes statins.  Hypertension  This is a chronic problem. The current episode started more than 1 year ago. Pertinent negatives include no chest pain, headaches or shortness of breath. Past treatments include angiotensin blockers.     Review of Systems  Constitutional: Negative for unexpected weight change.  HENT: Negative for trouble swallowing and voice change.   Eyes: Negative for visual disturbance.  Respiratory: Negative for cough, shortness of breath and wheezing.   Cardiovascular: Negative for chest pain and leg swelling.  Gastrointestinal: Negative for nausea and vomiting.  Endocrine: Positive for polydipsia and polyuria. Negative for polyphagia.  Musculoskeletal: Negative for arthralgias and myalgias.  Skin: Negative for color change, pallor, rash and wound.  Neurological: Negative for seizures and headaches.  Psychiatric/Behavioral: Negative for confusion and  suicidal ideas.    Objective:    BP 128/70   Pulse 66   Ht '5\' 2"'  (1.575 m)   SpO2 98%   Wt Readings from Last 3 Encounters:  01/30/16 188 lb 9.6 oz (85.5 kg)  09/06/15 192 lb (87.1 kg)  08/10/15 191 lb 12.8 oz (87 kg)    Physical Exam  Constitutional: She is oriented to person, place, and time. She appears well-developed.  HENT:  Head: Normocephalic and atraumatic.  Eyes: EOM are normal.  Neck: Normal range of motion. Neck supple. No tracheal deviation present. No thyromegaly present.  Cardiovascular: Normal rate and regular rhythm.   Pulmonary/Chest: Effort normal and breath sounds normal.  Abdominal: Soft. Bowel sounds are normal. There is no tenderness. There is no guarding.  Musculoskeletal: Normal range of motion. She exhibits no edema.  Neurological: She is alert and oriented to person, place, and time. She has normal reflexes. No cranial nerve deficit. Coordination normal.  Skin: Skin is warm and dry. No rash noted. No erythema. No pallor.  Psychiatric: She has a normal mood and affect. Judgment normal.    Results for orders placed or performed in visit on 01/30/16  Bayer DCA Hb A1c Waived  Result Value  Ref Range   Bayer DCA Hb A1c Waived 8.8 (H) <7.0 %  CMP14+EGFR  Result Value Ref Range   Glucose 245 (H) 65 - 99 mg/dL   BUN 18 6 - 24 mg/dL   Creatinine, Ser 0.81 0.57 - 1.00 mg/dL   GFR calc non Af Amer 80 >59 mL/min/1.73   GFR calc Af Amer 92 >59 mL/min/1.73   BUN/Creatinine Ratio 22 9 - 23   Sodium 138 134 - 144 mmol/L   Potassium 5.0 3.5 - 5.2 mmol/L   Chloride 97 96 - 106 mmol/L   CO2 22 18 - 29 mmol/L   Calcium 9.7 8.7 - 10.2 mg/dL   Total Protein 7.0 6.0 - 8.5 g/dL   Albumin 4.3 3.5 - 5.5 g/dL   Globulin, Total 2.7 1.5 - 4.5 g/dL   Albumin/Globulin Ratio 1.6 1.2 - 2.2   Bilirubin Total 0.3 0.0 - 1.2 mg/dL   Alkaline Phosphatase 113 39 - 117 IU/L   AST 14 0 - 40 IU/L   ALT 13 0 - 32 IU/L  Hepatitis C antibody  Result Value Ref Range   Hep C Virus  Ab <0.1 0.0 - 0.9 s/co ratio   Complete Blood Count (Most recent): Lab Results  Component Value Date   WBC 6.5 11/09/2014   HGB 12.7 11/09/2014   HCT 39.7 11/09/2014   HCT 39.8 11/09/2014   MCV 67.6 (L) 11/09/2014   PLT 261 11/09/2014   Chemistry (most recent): Lab Results  Component Value Date   NA 138 01/30/2016   K 5.0 01/30/2016   CL 97 01/30/2016   CO2 22 01/30/2016   BUN 18 01/30/2016   CREATININE 0.81 01/30/2016   Diabetic Labs (most recent): Lab Results  Component Value Date   HGBA1C 9.3 (H) 09/13/2015   HGBA1C 9.2 (H) 06/08/2015   HGBA1C 8.7 (H) 03/09/2015   Lipid Panel     Component Value Date/Time   CHOL 215 (H) 03/17/2015 1034   TRIG 61 03/17/2015 1034   HDL 92 03/17/2015 1034   CHOLHDL 2.3 03/17/2015 1034   LDLCALC 111 (H) 03/17/2015 1034    Assessment & Plan:   1. Uncontrolled type 2 diabetes mellitus with complication, with long-term current use of insulin ([place]) -She  remains at a high risk for more acute and chronic complications of diabetes which include CAD, CVA, CKD, retinopathy, and neuropathy. These are all discussed in detail with the patient.  Patient came with Improved glucose profile, and   recent A1c  in proving to 8.8% from 9.3%.      Recent labs reviewed.  - I have re-counseled the patient on diet management   by adopting a carbohydrate restricted / protein rich  Diet.  - Suggestion is made for patient to avoid simple carbohydrates   from their diet including Cakes , Desserts, Ice Cream,  Soda (  diet and regular) , Sweet Tea , Candies,  Chips, Cookies, Artificial Sweeteners,   and "Sugar-free" Products .  This will help patient to have stable blood glucose profile and potentially avoid unintended  Weight gain.  - Patient is advised to stick to a routine mealtimes to eat 3 meals  a day and avoid unnecessary snacks ( to snack only to correct hypoglycemia).  - She has seen a dietitian before.  - I have approached patient with the  following individualized plan to manage diabetes and patient agrees.  - I advised her to be more consistent in meal timing and portion size, will continue  Lantus at  22 units qhs,   Novolog  10 units TIDAC plus SSI. She will call me if she has hypoglycemia below 70.  She will continue MTF 557m po BID.  - Patient will be considered for incretin therapy as appropriate next visit. - Patient specific target  for A1c; LDL, HDL, Triglycerides, and  Waist Circumference were discussed in detail.  2) BP/HTN: Controlled. Continue current medications including ACEI/ARB. 3) Lipids/HPL:  continue statins. 4)  Weight/Diet: CDE consult in progress, exercise, and carbohydrates information provided.  5) hypothyroidism: She is clinically euthyroid. I advised her to continue levothyroxine 112 g by mouth every morning.   - We discussed about correct intake of levothyroxine, at fasting, with water, separated by at least 30 minutes from breakfast, and separated by more than 4 hours from calcium, iron, multivitamins, acid reflux medications (PPIs). -Patient is made aware of the fact that thyroid hormone replacement is needed for life, dose to be adjusted by periodic monitoring of thyroid function tests.   6) Chronic Care/Health Maintenance:  -Patient  on ACEI/ARB and Statin medications and encouraged to continue to follow up with Ophthalmology, Podiatrist at least yearly or according to recommendations, and advised to  stay away from smoking. I have recommended yearly flu vaccine and pneumonia vaccination at least every 5 years; moderate intensity exercise for up to 150 minutes weekly; and  sleep for at least 7 hours a day.  - 25 minutes of time was spent on the care of this patient , 50% of which was applied for counseling on diabetes complications and their preventions.  - I advised patient to maintain close follow up with CEustaquio Maize MD for primary care needs.  Patient is asked to bring meter and   blood glucose logs during their next visit.   Follow up plan: -Return in about 3 months (around 05/15/2016) for follow up with pre-visit labs, meter, and logs.  GGlade Lloyd MD Phone: [phone] Fax: [phone]  02/13/2016, 4:06 PM

## 2016-02-29 NOTE — Patient Outreach (Signed)
[facility]) Care Management  02/29/2016  Melissa Schneider Sep 08, 1955 [id number]   I have discharged the patient from the [facility] program for failure to engage with [facility] staff.  Discharge letter sent to MD.    [name], RN, BA, [place], [place]:  [phone]  Fax:  [phone] E-mail: [email or web address] [address], [place], [place]  [address]

## 2016-04-02 ENCOUNTER — Other Ambulatory Visit: Payer: Self-pay | Admitting: *Deleted

## 2016-04-02 NOTE — Patient Outreach (Addendum)
Melissa Schneider was terminated fom the [facility] program on 02/29/16 for failure to keep the program's appointment policy. Barrington Ellison RN,CCM,CDE [place] Management Coordinator [facility] Office Phone [phone] Office Fax [phone]

## 2016-04-10 ENCOUNTER — Other Ambulatory Visit: Payer: Self-pay | Admitting: "Endocrinology

## 2016-04-16 ENCOUNTER — Other Ambulatory Visit: Payer: Self-pay | Admitting: Family

## 2016-04-26 ENCOUNTER — Ambulatory Visit (INDEPENDENT_AMBULATORY_CARE_PROVIDER_SITE_OTHER): Payer: BLUE CROSS/BLUE SHIELD | Admitting: Pediatrics

## 2016-04-26 ENCOUNTER — Encounter: Payer: Self-pay | Admitting: Pediatrics

## 2016-04-26 DIAGNOSIS — Z23 Encounter for immunization: Secondary | ICD-10-CM

## 2016-04-26 DIAGNOSIS — M545 Low back pain, unspecified: Secondary | ICD-10-CM

## 2016-04-26 MED ORDER — CYCLOBENZAPRINE HCL 5 MG PO TABS: 5.0000 mg | ORAL_TABLET | Freq: Three times a day (TID) | ORAL | 0 refills | Status: DC | PRN

## 2016-04-26 NOTE — Patient Instructions (Addendum)

## 2016-04-26 NOTE — Progress Notes (Signed)
  Subjective:   Patient ID: Melissa Schneider, female    DOB: 05/28/1956, 59 y.o.   MRN: [id number] CC: Back Pain (Low)  HPI: Melissa Schneider is a 60 y.o. female presenting for Back Pain (Low)  Was changing sheets over a week ago, twisted her back, started having pain  Tried heat, didn't help Ice helps some Using aleve as well Was getting slightly better then Tripped over 50 lb puppy 2 days ago Landed on knees Made L side of back hurt more Shoulders, hands, back hurting  Backs hurt her when she is doing things, doesn't bother her at rest Turning over in bed is tough  Has had back pain before, hurt it in similar ways, bending over or picking up dog food that's heavy  Relevant past medical, surgical, family and social history reviewed. Allergies and medications reviewed and updated. History  Smoking Status  . Never Smoker  Smokeless Tobacco  . Never Used    Comment: Never smoked   ROS: Per HPI   Objective:    BP 133/75   Pulse 68   Temp (!) 96.9 F (36.1 C) (Oral)   Ht 5\' 2"  (1.575 m)   Wt 187 lb 9.6 oz (85.1 kg)   BMI 34.31 kg/m   Wt Readings from Last 3 Encounters:  04/26/16 187 lb 9.6 oz (85.1 kg)  01/30/16 188 lb 9.6 oz (85.5 kg)  09/06/15 192 lb (87.1 kg)    Gen: NAD, alert, cooperative with exam, NCAT EYES: EOMI, no conjunctival injection, or no icterus CV: NRRR, normal S1/S2, no murmur, distal pulses 2+ b/l Resp: CTABL, no wheezes, normal WOB Ext: No edema, warm Neuro: Alert and oriented, strength equal b/l UE and LE, coordination grossly normal MSK: normal muscle bulk, SLR negative b/l, no point tenderness along spine  Assessment & Plan:  [name] was seen today for back pain after recent injury.  Diagnoses and all orders for this visit:  Acute left-sided low back pain without sciatica Gentle back exercises, rest, ice -     cyclobenzaprine (FLEXERIL) 5 MG tablet; Take 1 tablet (5 mg total) by mouth 3 (three) times daily as needed for muscle  spasms.  Encounter for immunization -     Flu Vaccine QUAD 36+ mos IM   Follow up plan: Return in about 3 weeks (around 05/17/2016) for Dm2 follow up. [name], MD [place]

## 2016-05-07 ENCOUNTER — Ambulatory Visit (INDEPENDENT_AMBULATORY_CARE_PROVIDER_SITE_OTHER): Payer: BLUE CROSS/BLUE SHIELD

## 2016-05-07 ENCOUNTER — Encounter: Payer: Self-pay | Admitting: Family Medicine

## 2016-05-07 ENCOUNTER — Ambulatory Visit (INDEPENDENT_AMBULATORY_CARE_PROVIDER_SITE_OTHER): Payer: BLUE CROSS/BLUE SHIELD | Admitting: Family Medicine

## 2016-05-07 VITALS — BP 131/63 | HR 85 | Temp 98.0°F | Ht 62.0 in | Wt 185.2 lb

## 2016-05-07 DIAGNOSIS — M5432 Sciatica, left side: Secondary | ICD-10-CM

## 2016-05-07 DIAGNOSIS — R509 Fever, unspecified: Secondary | ICD-10-CM | POA: Diagnosis not present

## 2016-05-07 LAB — URINALYSIS, COMPLETE
BILIRUBIN UA: NEGATIVE
Leukocytes, UA: NEGATIVE
Nitrite, UA: NEGATIVE
PROTEIN UA: NEGATIVE
RBC UA: NEGATIVE
SPEC GRAV UA: 1.01 (ref 1.005–1.030)
UUROB: 0.2 mg/dL (ref 0.2–1.0)
pH, UA: 5.5 (ref 5.0–7.5)

## 2016-05-07 LAB — MICROSCOPIC EXAMINATION
Bacteria, UA: NONE SEEN
RBC, UA: NONE SEEN /hpf (ref 0–?)

## 2016-05-07 MED ORDER — KETOROLAC TROMETHAMINE 60 MG/2ML IM SOLN
60.0000 mg | Freq: Once | INTRAMUSCULAR | Status: AC
  Administered 2016-05-07: 60 mg via INTRAMUSCULAR

## 2016-05-07 NOTE — Patient Instructions (Signed)

## 2016-05-07 NOTE — Progress Notes (Addendum)
Subjective:  Patient ID: Melissa Schneider, female    DOB: 1956/03/29  Age: 60 y.o. MRN: [id number]  CC: Back Pain (pt here today c/o back pain that now is shooting down left thigh to her knee, she can't sleep at night due to the discomfort and walking is painful)   HPI Melissa Schneider presents for Continuing back pain. Starting 3 days ago it moved into her left buttocks and posterolateral thigh. It is a deep ache. Perhaps some electrical. She describes it as 8/10 today. However, it was 10/10 for the previous 2 days. 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 She has been using heat and ice alternating. She has taken 4 ibuprofen at a time on 2 occasions. None of these has given her significant relief.   History Melissa Schneider has a past medical history of Anemia (07/23/2011); Anxiety; Diabetes mellitus; Diabetic gastroparesis ([place]) (07/23/2011); Headache(784.0); Hearing difficulty; Hyperlipidemia; Hypothyroidism; Kidney stones; Microcytic anemia (07/23/2011); and Thalassemia minor.   She has a past surgical history that includes Wrist surgery (Right); Cystoscopy w/ ureteral stent placement (Right, 08/13/2013); Extracorporeal shock wave lithotripsy (Right, 08/19/2013); Colonoscopy (2004); and Colonoscopy (12/2011).   Her family history includes Colon cancer in her maternal grandmother; Colon polyps in her mother.She reports that she has never smoked. She has never used smokeless tobacco. She reports that she does not drink alcohol or use drugs.    ROS Review of Systems  Constitutional: Positive for activity change and fever. Negative for appetite change.  HENT: Negative for congestion, rhinorrhea and sore throat.   Eyes: Negative for visual disturbance.  Respiratory: Negative for cough and shortness of breath.   Cardiovascular: Negative for chest pain and palpitations.  Gastrointestinal: Negative for abdominal pain, diarrhea and nausea.  Genitourinary: Negative  for dysuria.  Musculoskeletal: Positive for arthralgias, back pain, gait problem and myalgias.    Objective:  BP 131/63   Pulse 85   Temp 98 F (36.7 C) (Oral)   Ht _0  (1.575 m)   Wt 185 lb 4 oz (84 kg)   BMI 33.88 kg/m   BP Readings from Last 3 Encounters:  05/07/16 131/63  04/26/16 133/75  02/13/16 128/70    Wt Readings from Last 3 Encounters:  05/07/16 185 lb 4 oz (84 kg)  04/26/16 187 lb 9.6 oz (85.1 kg)  01/30/16 188 lb 9.6 oz (85.5 kg)     Physical Exam   Lab Results  Component Value Date   WBC 6.6 05/07/2016   HGB 12.7 11/09/2014   HCT 39.1 05/07/2016   PLT 271 05/07/2016   GLUCOSE 447 (>) 05/07/2016   CHOL 215 (H) 03/17/2015   TRIG 61 03/17/2015   HDL 92 03/17/2015   LDLCALC 111 (H) 03/17/2015   ALT 20 05/07/2016   AST 18 05/07/2016   NA 139 05/07/2016   K 4.5 05/07/2016   CL 98 05/07/2016   CREATININE 0.92 05/07/2016   BUN 15 05/07/2016   CO2 24 05/07/2016   TSH 2.377 09/13/2015   INR 1.22 08/13/2013   HGBA1C 9.3 (H) 09/13/2015   MICROALBUR neg 03/17/2015    No results found.  Assessment & Plan:   Melissa Schneider was seen today for back pain.  Diagnoses and all orders for this visit:  Sciatica of left side -     CBC with Differential/Platelet -     CMP14+EGFR -     Sedimentation rate -     DG Lumbar Spine 2-3 Views; Future -  Urinalysis, Complete -     Urine culture -     Ambulatory referral to Physical Therapy -     ketorolac (TORADOL) injection 60 mg; Inject 2 mLs (60 mg total) into the muscle once.  Fever, unspecified fever cause  Other orders -     Microscopic Examination -     predniSONE (DELTASONE) 10 MG tablet; Take 5 daily for 3 days followed by 4,3,2 and 1 for 3 days each.    Low back pain exercises handout given  I am having Melissa Schneider start on predniSONE. I am also having her maintain her multivitamin with minerals, docusate sodium, linaclotide, gabapentin, aspirin, metFORMIN, pravastatin, losartan, levothyroxine,  NOVOLOG FLEXPEN, famotidine, LANTUS SOLOSTAR, SUMAtriptan, and cyclobenzaprine. We administered ketorolac.  Meds ordered this encounter  Medications  . ketorolac (TORADOL) injection 60 mg  . predniSONE (DELTASONE) 10 MG tablet    Sig: Take 5 daily for 3 days followed by 4,3,2 and 1 for 3 days each.    Dispense:  45 tablet    Refill:  0     Follow-up: Return in about 2 weeks (around 05/21/2016) for diabetes, Pain with Dr. Evette Doffing.  Claretta Fraise, M.D.

## 2016-05-08 LAB — CBC WITH DIFFERENTIAL/PLATELET
BASOS ABS: 0 10*3/uL (ref 0.0–0.2)
Basos: 1 %
EOS (ABSOLUTE): 0.2 10*3/uL (ref 0.0–0.4)
Eos: 4 %
Hematocrit: 39.1 % (ref 34.0–46.6)
Hemoglobin: 12 g/dL (ref 11.1–15.9)
Immature Grans (Abs): 0 10*3/uL (ref 0.0–0.1)
Immature Granulocytes: 0 %
LYMPHS ABS: 1.3 10*3/uL (ref 0.7–3.1)
LYMPHS: 20 %
MCH: 21.4 pg — AB (ref 26.6–33.0)
MCHC: 30.7 g/dL — AB (ref 31.5–35.7)
MCV: 70 fL — ABNORMAL LOW (ref 79–97)
MONOCYTES: 9 %
Monocytes Absolute: 0.6 10*3/uL (ref 0.1–0.9)
NEUTROS ABS: 4.5 10*3/uL (ref 1.4–7.0)
Neutrophils: 66 %
PLATELETS: 271 10*3/uL (ref 150–379)
RBC: 5.62 x10E6/uL — AB (ref 3.77–5.28)
RDW: 14.9 % (ref 12.3–15.4)
WBC: 6.6 10*3/uL (ref 3.4–10.8)

## 2016-05-08 LAB — CMP14+EGFR
ALK PHOS: 140 IU/L — AB (ref 39–117)
ALT: 20 IU/L (ref 0–32)
AST: 18 IU/L (ref 0–40)
Albumin/Globulin Ratio: 1.4 (ref 1.2–2.2)
Albumin: 4 g/dL (ref 3.5–5.5)
BILIRUBIN TOTAL: 0.3 mg/dL (ref 0.0–1.2)
BUN/Creatinine Ratio: 16 (ref 9–23)
BUN: 15 mg/dL (ref 6–24)
CHLORIDE: 98 mmol/L (ref 96–106)
CO2: 24 mmol/L (ref 18–29)
CREATININE: 0.92 mg/dL (ref 0.57–1.00)
Calcium: 9.1 mg/dL (ref 8.7–10.2)
GFR calc Af Amer: 79 mL/min/{1.73_m2} (ref >59)
GFR calc non Af Amer: 68 mL/min/{1.73_m2} (ref >59)
GLUCOSE: 447 mg/dL — AB (ref 65–99)
Globulin, Total: 2.8 g/dL (ref 1.5–4.5)
Potassium: 4.5 mmol/L (ref 3.5–5.2)
Sodium: 139 mmol/L (ref 134–144)
Total Protein: 6.8 g/dL (ref 6.0–8.5)

## 2016-05-08 LAB — SEDIMENTATION RATE: SED RATE: 27 mm/h (ref 0–40)

## 2016-05-08 LAB — URINE CULTURE

## 2016-05-08 MED ORDER — PREDNISONE 10 MG PO TABS: ORAL_TABLET | ORAL | 0 refills | Status: DC

## 2016-05-08 NOTE — Addendum Note (Signed)
Addended by: Claretta Fraise on: 05/08/2016 05:06 PM   Modules accepted: Orders

## 2016-05-14 ENCOUNTER — Ambulatory Visit: Payer: BLUE CROSS/BLUE SHIELD | Attending: Family Medicine | Admitting: Physical Therapy

## 2016-05-14 DIAGNOSIS — M545 Low back pain: Secondary | ICD-10-CM

## 2016-05-14 NOTE — Therapy (Signed)
[facility] [place], [place], [address] Phone: [phone]   Fax:  [phone]  Patient Details  Name: Melissa Schneider MRN: [id number] Date of Birth: [birth year] Referring Provider:  Claretta Fraise, MD  Encounter Date: 05/14/2016  *8Patient presented to clinic today stating that she is feeling much better and she no longer has left LE pain. She is doing a home exercise program and does not feel she needs PT at this time.  She was told to call the clinic should her pain return.  Amiere Cawley, [place] MPT 05/14/2016, 5:22 PM  [facility] [address] [place], [place], [address] Phone: [phone]   Fax:  [phone]

## 2016-05-21 ENCOUNTER — Ambulatory Visit: Payer: BLUE CROSS/BLUE SHIELD | Admitting: "Endocrinology

## 2016-05-23 ENCOUNTER — Encounter: Payer: Self-pay | Admitting: Pediatrics

## 2016-05-23 ENCOUNTER — Ambulatory Visit (INDEPENDENT_AMBULATORY_CARE_PROVIDER_SITE_OTHER): Payer: BLUE CROSS/BLUE SHIELD | Admitting: Pediatrics

## 2016-05-23 VITALS — Temp 98.3°F | Ht 62.0 in | Wt 186.7 lb

## 2016-05-23 DIAGNOSIS — Z794 Long term (current) use of insulin: Secondary | ICD-10-CM | POA: Diagnosis not present

## 2016-05-23 DIAGNOSIS — E039 Hypothyroidism, unspecified: Secondary | ICD-10-CM | POA: Diagnosis not present

## 2016-05-23 DIAGNOSIS — IMO0002 Reserved for concepts with insufficient information to code with codable children: Secondary | ICD-10-CM

## 2016-05-23 DIAGNOSIS — E118 Type 2 diabetes mellitus with unspecified complications: Secondary | ICD-10-CM

## 2016-05-23 DIAGNOSIS — E785 Hyperlipidemia, unspecified: Secondary | ICD-10-CM

## 2016-05-23 DIAGNOSIS — E1165 Type 2 diabetes mellitus with hyperglycemia: Secondary | ICD-10-CM

## 2016-05-23 LAB — BAYER DCA HB A1C WAIVED: HB A1C (BAYER DCA - WAIVED): 9.2 % — ABNORMAL HIGH (ref <7.0)

## 2016-05-23 MED ORDER — INSULIN ASPART 100 UNIT/ML FLEXPEN: PEN_INJECTOR | SUBCUTANEOUS | 2 refills | Status: DC

## 2016-05-23 MED ORDER — INSULIN GLARGINE 100 UNIT/ML SOLOSTAR PEN: PEN_INJECTOR | SUBCUTANEOUS | 2 refills | Status: DC

## 2016-05-23 MED ORDER — PEN NEEDLES 32G X 4 MM MISC: 1.0000 | Freq: Four times a day (QID) | 12 refills | Status: DC

## 2016-05-23 MED ORDER — METFORMIN HCL 500 MG PO TABS: 500.0000 mg | ORAL_TABLET | Freq: Two times a day (BID) | ORAL | 2 refills | Status: DC

## 2016-05-23 MED ORDER — GLUCOSE BLOOD VI STRP: ORAL_STRIP | 12 refills | Status: DC

## 2016-05-23 NOTE — Progress Notes (Signed)
  Subjective:   Patient ID: Melissa Schneider, female    DOB: [birth year], 60 y.o.   MRN: [id number] CC: Follow-up (Diabetic)  HPI: Melissa Schneider is a 60 y.o. female presenting for Follow-up (Diabetic)  DM2:  Lanuts: takes 20-25 at night Novolog: has a sliding scale that she uses Tried on invokana in the past Husband has been on  Eye exam last year, due for eye exam No pain in feet  Hypothyroid: Energy level normal  HTN: No chest pain, no SOB  Relevant past medical, surgical, family and social history reviewed. Allergies and medications reviewed and updated. History  Smoking Status  . Never Smoker  Smokeless Tobacco  . Never Used    Comment: Never smoked   ROS: Per HPI   Objective:    Temp 98.3 F (36.8 C) (Oral)   Ht 5\' 2"  (1.575 m)   Wt 186 lb 11.2 oz (84.7 kg)   BMI 34.15 kg/m   Wt Readings from Last 3 Encounters:  05/23/16 186 lb 11.2 oz (84.7 kg)  05/07/16 185 lb 4 oz (84 kg)  04/26/16 187 lb 9.6 oz (85.1 kg)    Gen: NAD, alert, cooperative with exam, NCAT EYES: EOMI, no conjunctival injection, or no icterus ENT:  TMs pearly gray b/l, OP without erythema LYMPH: no cervical LAD CV: NRRR, normal S1/S2, no murmur, distal pulses 2+ b/l Resp: CTABL, no wheezes, normal WOB Abd: +BS, soft, NTND. no guarding or organomegaly Ext: No edema, warm Neuro: Alert and oriented, strength equal b/l UE and LE, coordination grossly normal MSK: normal muscle bulk  Assessment & Plan:  Melissa Schneider was seen today for follow-up multiple med problems.  Diagnoses and all orders for this visit:  Uncontrolled type 2 diabetes mellitus with complication, with long-term current use of insulin ([facility]) Check glucose levels QID A1c 9.2, remains elevated A few low morning BGLs Not eating regular meals, sometimes skips breakfast, has various amount of carbs for dinner Cont aspart, lantus, metformin Trial trulicity -     Bayer DCA Hb A1c Waived -     glucose blood (TRUE METRIX BLOOD  GLUCOSE TEST) test strip; Use as instructed -     Insulin Pen Needle (PEN NEEDLES) 32G X 4 MM MISC; 1 each by Does not apply route 4 (four) times daily. -     insulin aspart (NOVOLOG FLEXPEN) 100 UNIT/ML FlexPen; INJECT 10-17 UNITS SQ 3 TIMES DAILY WITH MEALS -     Insulin Glargine (LANTUS SOLOSTAR) 100 UNIT/ML Solostar Pen; INJECT 22 UNITS SQ AT BEDTIME -     metFORMIN (GLUCOPHAGE) 500 MG tablet; Take 1 tablet (500 mg total) by mouth 2 (two) times daily with a meal.  Hypothyroidism, unspecified type Last TSH wnl, due next visit Cont levothyroxine No symptoms  Hyperlipidemia, unspecified hyperlipidemia type Cont pravastatin  Follow up plan: Return for as scheduled in January for med f/u. [name], MD [place]

## 2016-07-25 ENCOUNTER — Other Ambulatory Visit: Payer: Self-pay | Admitting: Pediatrics

## 2016-08-02 ENCOUNTER — Ambulatory Visit: Payer: BLUE CROSS/BLUE SHIELD | Admitting: Pediatrics

## 2016-11-01 ENCOUNTER — Other Ambulatory Visit: Payer: Self-pay | Admitting: "Endocrinology

## 2016-11-02 ENCOUNTER — Other Ambulatory Visit: Payer: Self-pay | Admitting: Pediatrics

## 2017-01-09 ENCOUNTER — Ambulatory Visit: Payer: Self-pay | Admitting: Pediatrics

## 2017-02-01 ENCOUNTER — Telehealth: Payer: Self-pay | Admitting: Cardiology

## 2017-02-01 ENCOUNTER — Ambulatory Visit (INDEPENDENT_AMBULATORY_CARE_PROVIDER_SITE_OTHER): Payer: BLUE CROSS/BLUE SHIELD | Admitting: Cardiology

## 2017-02-01 ENCOUNTER — Encounter: Payer: Self-pay | Admitting: *Deleted

## 2017-02-01 VITALS — BP 111/69 | HR 73 | Ht 62.0 in | Wt 197.0 lb

## 2017-02-01 DIAGNOSIS — I10 Essential (primary) hypertension: Secondary | ICD-10-CM | POA: Diagnosis not present

## 2017-02-01 DIAGNOSIS — R079 Chest pain, unspecified: Secondary | ICD-10-CM

## 2017-02-01 NOTE — Progress Notes (Signed)
Clinical Summary [name] is a 61 y.o.female seen today for follow up of the following medical problems.   1. Chest pain - started off and on x 10 years ago. Squeezing like feeling left sided, 4-5/10. Often occurs at rest. No other associated symptoms. Not positional, but can feel better with deep breaths. Lasts up to 1 hour. Occurs approx once a week. No relation to food. Can have some dysphagia at times that is separate. Has had some increase in frequency, stable severity - can get some DOE, example walking up stairs which is new, though she does report 40 lbs weight gain over the last year. - no LE edema, no orthopnea, no PND.  CAD risk factors: DM2, HL, brother MI age 60.    - still with chest pain at times.  - episode 3 weeks ago of severe pain. Occurred while at rest. Aching pain left chest, 8/10. Not positional. No other associated symptoms. Lasted 1 hour.  - similar episode after , less intense and shorter in duration.  - no recurrent episodes - can have some SOB/DOE. Example walking up flight of stairs at home gets SOB. Somewhat stable.    Past Medical History:  Diagnosis Date  . Anemia 07/23/2011  . Anxiety   . Diabetes mellitus   . Diabetic gastroparesis ([place]) 07/23/2011   Suspected  . Headache(784.0)   . Hearing difficulty   . Hyperlipidemia   . Hypothyroidism   . Kidney stones   . Microcytic anemia 07/23/2011  . Thalassemia minor    ????? not really worked up per pt but daughter has it???     Allergies  Allergen Reactions  . Codeine Nausea Only and Other (See Comments)    NAUSEA AND CAUSES HER TO HAVE BAD DREAMS  . Lisinopril     Coughing     Current Outpatient Prescriptions  Medication Sig Dispense Refill  . aspirin 81 MG tablet Take 81 mg by mouth daily.    . cyclobenzaprine (FLEXERIL) 5 MG tablet Take 1 tablet (5 mg total) by mouth 3 (three) times daily as needed for muscle spasms. 20 tablet 0  . docusate sodium (COLACE) 100 MG capsule Take 200 mg  by mouth at bedtime.    . famotidine (PEPCID) 20 MG tablet Take 1 tablet (20 mg total) by mouth 2 (two) times daily. 30 tablet 2  . gabapentin (NEURONTIN) 300 MG capsule Take 1 capsule (300 mg total) by mouth 3 (three) times daily. 90 capsule 5  . glucose blood (TRUE METRIX BLOOD GLUCOSE TEST) test strip Use as instructed 100 each 12  . insulin aspart (NOVOLOG FLEXPEN) 100 UNIT/ML FlexPen INJECT 10-17 UNITS SQ 3 TIMES DAILY WITH MEALS 15 mL 2  . Insulin Glargine (LANTUS SOLOSTAR) 100 UNIT/ML Solostar Pen INJECT 22 UNITS SQ AT BEDTIME 15 mL 2  . Insulin Pen Needle (PEN NEEDLES) 32G X 4 MM MISC 1 each by Does not apply route 4 (four) times daily. 100 each 12  . levothyroxine (SYNTHROID, LEVOTHROID) 112 MCG tablet TAKE ONE (1) TABLET EACH DAY 90 tablet 2  . Linaclotide (LINZESS) 290 MCG CAPS capsule Take 1 capsule (290 mcg total) by mouth daily. 30 capsule 5  . losartan (COZAAR) 25 MG tablet TAKE ONE (1) TABLET EACH DAY 90 tablet 0  . metFORMIN (GLUCOPHAGE) 500 MG tablet Take 1 tablet (500 mg total) by mouth 2 (two) times daily with a meal. 180 tablet 2  . Multiple Vitamin (MULTIVITAMIN WITH MINERALS) TABS tablet Take  1 tablet by mouth daily.    Marland Kitchen NOVOLOG FLEXPEN 100 UNIT/ML FlexPen INJECT 10-17 UNITS SQ 3 TIMES DAILY WITHMEALS 15 mL 0  . pravastatin (PRAVACHOL) 20 MG tablet TAKE ONE (1) TABLET EACH DAY 90 tablet 0  . SUMAtriptan (IMITREX) 100 MG tablet TAKE 1 TABLET AT ONSET OF MIGRAINE HEADACHE MAY REPEAT ONCE IN 2 HOURS (MAX OF 2 TABLETS/24 HOURS) 9 tablet 2   No current facility-administered medications for this visit.      Past Surgical History:  Procedure Laterality Date  . COLONOSCOPY  2004   Dr. Jim Desanctis: small fissue and hemorrhoids  . COLONOSCOPY  12/2011   Dr. Paulita Fujita: normal  . CYSTOSCOPY W/ URETERAL STENT PLACEMENT Right 08/13/2013   Procedure: CYSTOSCOPY WITH RETROGRADE PYELOGRAM/URETERAL STENT PLACEMENT;  Surgeon: Marissa Nestle, MD;  Location: [facility];  Service: Urology;   Laterality: Right;  . EXTRACORPOREAL SHOCK WAVE LITHOTRIPSY Right 08/19/2013   Procedure: EXTRACORPOREAL SHOCK WAVE LITHOTRIPSY (ESWL) RIGHT URETERAL CALCULUS;  Surgeon: Marissa Nestle, MD;  Location: [facility];  Service: Urology;  Laterality: Right;  . WRIST SURGERY Right      Allergies  Allergen Reactions  . Codeine Nausea Only and Other (See Comments)    NAUSEA AND CAUSES HER TO HAVE BAD DREAMS  . Lisinopril     Coughing      Family History  Problem Relation Age of Onset  . Colon cancer Maternal Grandmother        age 11  . Colon polyps Mother      Social History Ms. Streight reports that she has never smoked. She has never used smokeless tobacco. Ms. Kavanaugh reports that she does not drink alcohol.   Review of Systems CONSTITUTIONAL: No weight loss, fever, chills, weakness or fatigue.  HEENT: Eyes: No visual loss, blurred vision, double vision or yellow sclerae.No hearing loss, sneezing, congestion, runny nose or sore throat.  SKIN: No rash or itching.  CARDIOVASCULAR: per hpi RESPIRATORY: per hpi  GASTROINTESTINAL: No anorexia, nausea, vomiting or diarrhea. No abdominal pain or blood.  GENITOURINARY: No burning on urination, no polyuria NEUROLOGICAL: No headache, dizziness, syncope, paralysis, ataxia, numbness or tingling in the extremities. No change in bowel or bladder control.  MUSCULOSKELETAL: No muscle, back pain, joint pain or stiffness.  LYMPHATICS: No enlarged nodes. No history of splenectomy.  PSYCHIATRIC: No history of depression or anxiety.  ENDOCRINOLOGIC: No reports of sweating, cold or heat intolerance. No polyuria or polydipsia.  Marland Kitchen   Physical Examination Vitals:   02/01/17 1501  BP: 111/69  Pulse: 73   Vitals:   02/01/17 1501  Weight: 197 lb (89.4 kg)  Height: 5\' 2"  (1.575 m)    Gen: resting comfortably, no acute distress HEENT: no scleral icterus, pupils equal round and reactive, no palptable cervical adenopathy,  CV: RRR, 2/6 systolic  murmur rusb,  no jvd Resp: Clear to auscultation bilaterally GI: abdomen is soft, non-tender, non-distended, normal bowel sounds, no hepatosplenomegaly MSK: extremities are warm, no edema.  Skin: warm, no rash Neuro:  no focal deficits Psych: appropriate affect   Diagnostic Studies  08/2015 GXT  There was no ST segment deviation noted during stress.  No T wave inversion was noted during stress.  Motion artifact No definite ST changes to suggest ischemia    Finalized by Fay Records, MD on Mon Jan 23, 2016 3:32 PM   There was no ST segment deviation noted during stress.  No T wave inversion was noted during stress.  Pressure rate  product is 20, 701. Test at marginal workload For heart rate achieved, there were no ischemic EKG changes Conclusion limited.     08/2015 echo Study Conclusions  - Left ventricle: The cavity size was normal. Wall thickness was at   the upper limits of normal. Systolic function was normal. The   estimated ejection fraction was in the range of 60% to 65%. Wall   motion was normal; there were no regional wall motion   abnormalities. Left ventricular diastolic function parameters   were normal. - Ascending aorta: The ascending aorta was mildly ectatic. - Mitral valve: There was trivial regurgitation. - Right atrium: Central venous pressure (est): 3 mm Hg. - Tricuspid valve: There was trivial regurgitation. - Pulmonary arteries: PA peak pressure: 23 mm Hg (S). - Pericardium, extracardiac: There was no pericardial effusion.  Impressions:  - Upper normal LV wall thickness with LVEF 60-65% and grossly   normal diastolic function. Mildly ectatic ascending aorta.   Trivial mitral and tricuspid regurgitation. Estimated PASP 23   mmHg.   Assessment and Plan   1. Chest pain - ongoing symptoms. GXT last year limited by motion artifact, no clear ischemic changes. Only exercised 4 minutes - EKG today SR, no acute ischemic changes - in setting of  ongoing symptoms and somewhat limited GXT  we will obtain an exercise nuclear stress test to further evaluate.    F/u 1 month    Arnoldo Lenis, M.D

## 2017-02-01 NOTE — Patient Instructions (Signed)
Your physician recommends that you schedule a follow-up appointment in: [place] recommends that you continue on your current medications as directed. Please refer to the Current Medication list given to you today.  Your physician has requested that you have en exercise stress myoview. For further information please visit [email or web address]. Please follow instruction sheet, as given.  Thank you for choosing [facility]!!

## 2017-02-01 NOTE — Telephone Encounter (Signed)
Pre-cert Verification for the following procedure   EXERCISE NUCLEAR STRESS TEST scheduled for 02-07-17 at [facility]

## 2017-02-07 ENCOUNTER — Encounter (HOSPITAL_COMMUNITY)
Admission: RE | Admit: 2017-02-07 | Discharge: 2017-02-07 | Disposition: A | Discharge status: Home / Self Care | Payer: BLUE CROSS/BLUE SHIELD | Source: Ambulatory Visit | Attending: Cardiology | Admitting: Cardiology

## 2017-02-07 ENCOUNTER — Encounter (HOSPITAL_BASED_OUTPATIENT_CLINIC_OR_DEPARTMENT_OTHER)
Admission: RE | Admit: 2017-02-07 | Discharge: 2017-02-07 | Disposition: A | Discharge status: Home / Self Care | Payer: BLUE CROSS/BLUE SHIELD | Source: Ambulatory Visit | Attending: Cardiology | Admitting: Cardiology

## 2017-02-07 ENCOUNTER — Encounter (HOSPITAL_COMMUNITY): Payer: Self-pay

## 2017-02-07 DIAGNOSIS — R079 Chest pain, unspecified: Secondary | ICD-10-CM | POA: Insufficient documentation

## 2017-02-07 LAB — NM MYOCAR MULTI W/SPECT W/WALL MOTION / EF
CHL CUP MPHR: 160 {beats}/min
CHL CUP NUCLEAR SDS: 1
CHL CUP NUCLEAR SRS: 0
CHL CUP NUCLEAR SSS: 1
CHL CUP RESTING HR STRESS: 82 {beats}/min
CSEPED: 4 min
CSEPEDS: 32 s
CSEPEW: 7 METS
CSEPHR: 88 %
CSEPPHR: 142 {beats}/min
LV dias vol: 39 mL (ref 46–106)
LV sys vol: 9 mL
RATE: 0.21
RPE: 15
TID: 0.89

## 2017-02-07 MED ORDER — TECHNETIUM TC 99M TETROFOSMIN IV KIT
10.0000 | PACK | Freq: Once | INTRAVENOUS | Status: AC | PRN
  Administered 2017-02-07: 10.4 via INTRAVENOUS

## 2017-02-07 MED ORDER — SODIUM CHLORIDE 0.9% FLUSH
INTRAVENOUS | Status: AC
  Administered 2017-02-07: 10 mL via INTRAVENOUS
  Filled 2017-02-07: qty 10

## 2017-02-07 MED ORDER — REGADENOSON 0.4 MG/5ML IV SOLN
INTRAVENOUS | Status: AC
  Filled 2017-02-07: qty 5

## 2017-02-07 MED ORDER — TECHNETIUM TC 99M TETROFOSMIN IV KIT
30.0000 | PACK | Freq: Once | INTRAVENOUS | Status: AC | PRN
  Administered 2017-02-07: 31 via INTRAVENOUS

## 2017-02-12 ENCOUNTER — Telehealth: Payer: Self-pay | Admitting: *Deleted

## 2017-02-12 NOTE — Telephone Encounter (Signed)
Pt aware - routed to pcp  

## 2017-02-12 NOTE — Telephone Encounter (Signed)
-----   Message from Arnoldo Lenis, MD sent at 02/11/2017  1:54 PM EDT ----- Stress test looks good, no evidence of blockages. We will discuss further at our f/u   J BrancH MD

## 2017-02-28 ENCOUNTER — Other Ambulatory Visit: Payer: Self-pay | Admitting: "Endocrinology

## 2017-03-07 ENCOUNTER — Ambulatory Visit (INDEPENDENT_AMBULATORY_CARE_PROVIDER_SITE_OTHER): Payer: BLUE CROSS/BLUE SHIELD | Admitting: Cardiology

## 2017-03-07 ENCOUNTER — Encounter: Payer: Self-pay | Admitting: Internal Medicine

## 2017-03-07 ENCOUNTER — Encounter: Payer: Self-pay | Admitting: Cardiology

## 2017-03-07 VITALS — BP 120/78 | HR 73 | Ht 62.0 in | Wt 198.0 lb

## 2017-03-07 DIAGNOSIS — R0789 Other chest pain: Secondary | ICD-10-CM | POA: Diagnosis not present

## 2017-03-07 DIAGNOSIS — K219 Gastro-esophageal reflux disease without esophagitis: Secondary | ICD-10-CM

## 2017-03-07 DIAGNOSIS — R1310 Dysphagia, unspecified: Secondary | ICD-10-CM

## 2017-03-07 NOTE — Progress Notes (Signed)
Clinical Summary [name] is a 61 y.o.female seen today for follow up of the following medical problems.   1. Chest pain - started off and on x 10 years ago. Squeezing like feeling left sided, 4-5/10. Often occurs at rest. No other associated symptoms. Not positional, but can feel better with deep breaths. Lasts up to 1 hour. Occurs approx once a week. No relation to food. Can have some dysphagia at times that is separate. Has had some increase in frequency, stable severity - can get some DOE, example walking up stairs which is new, though she does report 40 lbs weight gain over the last year. - no LE edema, no orthopnea, no PND.  CAD risk factors: DM2, HL, brother MI age 79.    - still with chest pain at times.  - episode 3 weeks ago of severe pain. Occurred while at rest. Aching pain left chest, 8/10. Not positional. No other associated symptoms. Lasted 1 hour.  - similar episode after , less intense and shorter in duration.  - no recurrent episodes - can have some SOB/DOE. Example walking up flight of stairs at home gets SOB. Somewhat stable.   - 01/2017 nuclear stress no ischemia.  - some improvement with pepcid. Can have reflux.  Past Medical History:  Diagnosis Date  . Anemia 07/23/2011  . Anxiety   . Diabetes mellitus   . Diabetic gastroparesis ([place]) 07/23/2011   Suspected  . Headache(784.0)   . Hearing difficulty   . Hyperlipidemia   . Hypothyroidism   . Kidney stones   . Microcytic anemia 07/23/2011  . Thalassemia minor    ????? not really worked up per pt but daughter has it???     Allergies  Allergen Reactions  . Codeine Nausea Only and Other (See Comments)    NAUSEA AND CAUSES HER TO HAVE BAD DREAMS  . Lisinopril     Coughing     Current Outpatient Prescriptions  Medication Sig Dispense Refill  . aspirin 81 MG tablet Take 81 mg by mouth daily.    . cyclobenzaprine (FLEXERIL) 5 MG tablet Take 1 tablet (5 mg total) by mouth 3 (three) times daily as  needed for muscle spasms. 20 tablet 0  . docusate sodium (COLACE) 100 MG capsule Take 200 mg by mouth at bedtime.    . famotidine (PEPCID) 20 MG tablet Take 1 tablet (20 mg total) by mouth 2 (two) times daily. 30 tablet 2  . gabapentin (NEURONTIN) 300 MG capsule Take 1 capsule (300 mg total) by mouth 3 (three) times daily. 90 capsule 5  . glucose blood (TRUE METRIX BLOOD GLUCOSE TEST) test strip Use as instructed 100 each 12  . insulin aspart (NOVOLOG FLEXPEN) 100 UNIT/ML FlexPen INJECT 10-17 UNITS SQ 3 TIMES DAILY WITH MEALS 15 mL 2  . Insulin Glargine (LANTUS SOLOSTAR) 100 UNIT/ML Solostar Pen INJECT 22 UNITS SQ AT BEDTIME 15 mL 2  . Insulin Pen Needle (PEN NEEDLES) 32G X 4 MM MISC 1 each by Does not apply route 4 (four) times daily. 100 each 12  . levothyroxine (SYNTHROID, LEVOTHROID) 112 MCG tablet TAKE ONE (1) TABLET EACH DAY 90 tablet 2  . Linaclotide (LINZESS) 290 MCG CAPS capsule Take 1 capsule (290 mcg total) by mouth daily. 30 capsule 5  . losartan (COZAAR) 25 MG tablet TAKE ONE (1) TABLET EACH DAY 90 tablet 0  . metFORMIN (GLUCOPHAGE) 500 MG tablet Take 1 tablet (500 mg total) by mouth 2 (two) times daily with  a meal. 180 tablet 2  . Multiple Vitamin (MULTIVITAMIN WITH MINERALS) TABS tablet Take 1 tablet by mouth daily.    Marland Kitchen NOVOLOG FLEXPEN 100 UNIT/ML FlexPen INJECT 10-17 UNITS SQ 3 TIMES DAILY WITHMEALS 15 mL 0  . pravastatin (PRAVACHOL) 20 MG tablet TAKE ONE (1) TABLET EACH DAY 90 tablet 0  . SUMAtriptan (IMITREX) 100 MG tablet TAKE 1 TABLET AT ONSET OF MIGRAINE HEADACHE MAY REPEAT ONCE IN 2 HOURS (MAX OF 2 TABLETS/24 HOURS) 9 tablet 2   No current facility-administered medications for this visit.      Past Surgical History:  Procedure Laterality Date  . COLONOSCOPY  2004   Dr. Jim Desanctis: small fissue and hemorrhoids  . COLONOSCOPY  12/2011   Dr. Paulita Fujita: normal  . CYSTOSCOPY W/ URETERAL STENT PLACEMENT Right 08/13/2013   Procedure: CYSTOSCOPY WITH RETROGRADE  PYELOGRAM/URETERAL STENT PLACEMENT;  Surgeon: Marissa Nestle, MD;  Location: [facility];  Service: Urology;  Laterality: Right;  . EXTRACORPOREAL SHOCK WAVE LITHOTRIPSY Right 08/19/2013   Procedure: EXTRACORPOREAL SHOCK WAVE LITHOTRIPSY (ESWL) RIGHT URETERAL CALCULUS;  Surgeon: Marissa Nestle, MD;  Location: [facility];  Service: Urology;  Laterality: Right;  . WRIST SURGERY Right      Allergies  Allergen Reactions  . Codeine Nausea Only and Other (See Comments)    NAUSEA AND CAUSES HER TO HAVE BAD DREAMS  . Lisinopril     Coughing      Family History  Problem Relation Age of Onset  . Colon cancer Maternal Grandmother        age 36  . Colon polyps Mother      Social History [name] reports that she has never smoked. She has never used smokeless tobacco. [name] reports that she does not drink alcohol.   Review of Systems CONSTITUTIONAL: No weight loss, fever, chills, weakness or fatigue.  HEENT: Eyes: No visual loss, blurred vision, double vision or yellow sclerae.No hearing loss, sneezing, congestion, runny nose or sore throat.  SKIN: No rash or itching.  CARDIOVASCULAR: per hpi RESPIRATORY: No shortness of breath, cough or sputum.  GASTROINTESTINAL: per hpi  GENITOURINARY: No burning on urination, no polyuria NEUROLOGICAL: No headache, dizziness, syncope, paralysis, ataxia, numbness or tingling in the extremities. No change in bowel or bladder control.  MUSCULOSKELETAL: No muscle, back pain, joint pain or stiffness.  LYMPHATICS: No enlarged nodes. No history of splenectomy.  PSYCHIATRIC: No history of depression or anxiety.  ENDOCRINOLOGIC: No reports of sweating, cold or heat intolerance. No polyuria or polydipsia.  Marland Kitchen   Physical Examination Vitals:   03/07/17 0956  BP: 120/78  Pulse: 73  SpO2: 98%   Vitals:   03/07/17 0956  Weight: 198 lb (89.8 kg)  Height: 5\' 2"  (1.575 m)    Gen: resting comfortably, no acute distress HEENT: no scleral icterus,  pupils equal round and reactive, no palptable cervical adenopathy,  CV: RRR, no m/rg, no jvd Resp: Clear to auscultation bilaterally GI: abdomen is soft, non-tender, non-distended, normal bowel sounds, no hepatosplenomegaly MSK: extremities are warm, no edema.  Skin: warm, no rash Neuro:  no focal deficits Psych: appropriate affect   Diagnostic Studies 08/2015 GXT  There was no ST segment deviation noted during stress.  No T wave inversion was noted during stress.  Motion artifact No definite ST changes to suggest ischemia   Finalized by Fay Records, MD on Mon Jan 23, 2016 3:32 PM   There was no ST segment deviation noted during stress.  No T  wave inversion was noted during stress.  Pressure rate product is 20, 701. Test at marginal workload For heart rate achieved, there were no ischemic EKG changes Conclusion limited.     08/2015 echo Study Conclusions  - Left ventricle: The cavity size was normal. Wall thickness was at the upper limits of normal. Systolic function was normal. The estimated ejection fraction was in the range of 60% to 65%. Wall motion was normal; there were no regional wall motion abnormalities. Left ventricular diastolic function parameters were normal. - Ascending aorta: The ascending aorta was mildly ectatic. - Mitral valve: There was trivial regurgitation. - Right atrium: Central venous pressure (est): 3 mm Hg. - Tricuspid valve: There was trivial regurgitation. - Pulmonary arteries: PA peak pressure: 23 mm Hg (S). - Pericardium, extracardiac: There was no pericardial effusion.  Impressions:  - Upper normal LV wall thickness with LVEF 60-65% and grossly normal diastolic function. Mildly ectatic ascending aorta. Trivial mitral and tricuspid regurgitation. Estimated PASP 23 mmHg.   01/2017 nuclear stress  No diagnostic ST segment changes to indicate ischemia. Low risk Duke treadmill score 4.5.  Blood pressure  demonstrated a hypertensive response to exercise.  Small, mild intensity, fixed anteroapical defect consistent with breast attenuation.  This is a low risk study.  Nuclear stress EF: 76%.   Assessment and Plan  1. Chest pain - recent negative nuclear stress test. Possible GI symptoms, we will refer to GI for evaluation - continue to monitor.        Arnoldo Lenis, M.D.

## 2017-03-07 NOTE — Patient Instructions (Signed)
Your physician recommends that you schedule a follow-up appointment in: [place]  Your physician recommends that you continue on your current medications as directed. Please refer to the Current Medication list given to you today.  You have been referred to GASTROENTEROLOGY   Thank you for choosing [facility]!!

## 2017-04-25 ENCOUNTER — Ambulatory Visit: Payer: BLUE CROSS/BLUE SHIELD | Admitting: Gastroenterology

## 2017-06-17 ENCOUNTER — Ambulatory Visit: Payer: BLUE CROSS/BLUE SHIELD | Admitting: Cardiology

## 2017-06-19 ENCOUNTER — Ambulatory Visit: Payer: BLUE CROSS/BLUE SHIELD | Admitting: Pediatrics

## 2017-06-19 ENCOUNTER — Encounter: Payer: Self-pay | Admitting: Pediatrics

## 2017-06-19 VITALS — BP 131/86 | HR 66 | Temp 97.4°F | Ht 62.0 in | Wt 201.0 lb

## 2017-06-19 DIAGNOSIS — E1169 Type 2 diabetes mellitus with other specified complication: Secondary | ICD-10-CM

## 2017-06-19 DIAGNOSIS — I10 Essential (primary) hypertension: Secondary | ICD-10-CM

## 2017-06-19 DIAGNOSIS — E785 Hyperlipidemia, unspecified: Secondary | ICD-10-CM | POA: Diagnosis not present

## 2017-06-19 DIAGNOSIS — E1165 Type 2 diabetes mellitus with hyperglycemia: Secondary | ICD-10-CM | POA: Diagnosis not present

## 2017-06-19 DIAGNOSIS — K219 Gastro-esophageal reflux disease without esophagitis: Secondary | ICD-10-CM | POA: Diagnosis not present

## 2017-06-19 DIAGNOSIS — E039 Hypothyroidism, unspecified: Secondary | ICD-10-CM | POA: Diagnosis not present

## 2017-06-19 DIAGNOSIS — E118 Type 2 diabetes mellitus with unspecified complications: Secondary | ICD-10-CM

## 2017-06-19 DIAGNOSIS — Z23 Encounter for immunization: Secondary | ICD-10-CM | POA: Diagnosis not present

## 2017-06-19 DIAGNOSIS — K5900 Constipation, unspecified: Secondary | ICD-10-CM | POA: Diagnosis not present

## 2017-06-19 DIAGNOSIS — IMO0002 Reserved for concepts with insufficient information to code with codable children: Secondary | ICD-10-CM

## 2017-06-19 DIAGNOSIS — Z794 Long term (current) use of insulin: Secondary | ICD-10-CM | POA: Diagnosis not present

## 2017-06-19 LAB — BAYER DCA HB A1C WAIVED: HB A1C (BAYER DCA - WAIVED): 8.5 % — ABNORMAL HIGH (ref <7.0)

## 2017-06-19 MED ORDER — INSULIN GLARGINE 100 UNIT/ML SOLOSTAR PEN: PEN_INJECTOR | SUBCUTANEOUS | 2 refills | Status: DC

## 2017-06-19 MED ORDER — LOSARTAN POTASSIUM 25 MG PO TABS: ORAL_TABLET | ORAL | 0 refills | Status: DC

## 2017-06-19 MED ORDER — GLUCOSE BLOOD VI STRP: ORAL_STRIP | 12 refills | Status: DC

## 2017-06-19 MED ORDER — INSULIN ASPART 100 UNIT/ML FLEXPEN: PEN_INJECTOR | SUBCUTANEOUS | 2 refills | Status: DC

## 2017-06-19 MED ORDER — PEN NEEDLES 32G X 4 MM MISC: 1.0000 | Freq: Four times a day (QID) | 12 refills | Status: DC

## 2017-06-19 MED ORDER — FAMOTIDINE 20 MG PO TABS: 20.0000 mg | ORAL_TABLET | Freq: Two times a day (BID) | ORAL | 2 refills | Status: DC

## 2017-06-19 MED ORDER — LINACLOTIDE 290 MCG PO CAPS: 290.0000 ug | ORAL_CAPSULE | Freq: Every day | ORAL | 0 refills | Status: DC

## 2017-06-19 MED ORDER — METFORMIN HCL 500 MG PO TABS: 500.0000 mg | ORAL_TABLET | Freq: Two times a day (BID) | ORAL | 0 refills | Status: DC

## 2017-06-19 MED ORDER — LEVOTHYROXINE SODIUM 112 MCG PO TABS: ORAL_TABLET | ORAL | 0 refills | Status: DC

## 2017-06-19 MED ORDER — PRAVASTATIN SODIUM 20 MG PO TABS: ORAL_TABLET | ORAL | 0 refills | Status: DC

## 2017-06-19 NOTE — Progress Notes (Signed)
Subjective:   Patient ID: Melissa Schneider, female    DOB: 1955/11/22, 61 y.o.   MRN: [id number] CC: Follow-up and Diabetes  HPI: Melissa Schneider is a 61 y.o. female presenting for Follow-up and Diabetes  Last visit over a year ago, has been taking care of her ailing mother  Diabetes: Taking 25 units of Lantus consistently most nights Using fast acting insulin, NovoLog, 10-14 units based on sliding scale she has at home before each meal Checking blood sugars before each meal Breakfast ranges from low 100s-200s on the December calendar she brought, showing 5 days so far Lunch ranging from mid 80s-300s Dinner ranging from mid 80s-300 Avoids bread Eat potatoes regularly Wants to get blood sugars under better control Taking metformin daily  GERD: Taking Pepcid twice a day, symptoms controlled  Hyperlipidemia: Taking statin regularly, no side effects  Hypothyroidism: Taking medicine regularly, is feeling more tired recently  Constipation: Taking docusate and Linzess regularly, having daily bowel movements now, no bloody or dark colored bowel movements  No chest pain, no shortness of breath, no fevers Energy levels have been down  Relevant past medical, surgical, family and social history reviewed. Allergies and medications reviewed and updated. Social History   Tobacco Use  Smoking Status Never Smoker  Smokeless Tobacco Never Used  Tobacco Comment   Never smoked   ROS: Per HPI   Objective:    BP 131/86   Pulse 66   Temp (!) 97.4 F (36.3 C) (Oral)   Ht '5\' 2"'  (1.575 m)   Wt 201 lb (91.2 kg)   BMI 36.76 kg/m   Wt Readings from Last 3 Encounters:  06/19/17 201 lb (91.2 kg)  03/07/17 198 lb (89.8 kg)  02/01/17 197 lb (89.4 kg)    Gen: NAD, alert, cooperative with exam, NCAT EYES: EOMI, no conjunctival injection, or no icterus ENT:   OP without erythema LYMPH: no cervical LAD CV: NRRR, normal S1/S2, no murmur, distal pulses 2+ b/l Resp: CTABL, no wheezes,  normal WOB Abd: +BS, soft, NTND. no guarding or organomegaly Ext: No edema, warm, normal foot exam see separate documentation Neuro: Alert and oriented, strength equal b/l UE and LE, coordination grossly normal MSK: normal muscle bulk  Assessment & Plan:  Melissa Schneider was seen today for follow-up and diabetes.  Diagnoses and all orders for this visit:  Essential hypertension, benign Borderline today, check at home, continue below  due for labs -     losartan (COZAAR) 25 MG tablet; TAKE ONE (1) TABLET EACH DAY -     CMP14+EGFR  Uncontrolled type 2 diabetes mellitus with hyperglycemia (HCC) A1c 8.5 today Labile blood sugars in the last 5 days, previous calendars with blood sugars with her today Discussed foods to avoid, try to be consistent with carb intake with meals rtc 3 mo -     Bayer DCA Hb A1c Waived -     glucose blood (TRUE METRIX BLOOD GLUCOSE TEST) test strip; Use as instructed -     Insulin Glargine (LANTUS SOLOSTAR) 100 UNIT/ML Solostar Pen; INJECT 22 UNITS SQ AT BEDTIME -     Insulin Pen Needle (PEN NEEDLES) 32G X 4 MM MISC; 1 each by Does not apply route 4 (four) times daily. -     insulin aspart (NOVOLOG FLEXPEN) 100 UNIT/ML FlexPen; INJECT 10-17 UNITS SQ 3 TIMES DAILY WITH MEALS -     metFORMIN (GLUCOPHAGE) 500 MG tablet; Take 1 tablet (500 mg total) by mouth 2 (two) times daily with a  meal.  Constipation, unspecified constipation type Stable, continue below -     linaclotide (LINZESS) 290 MCG CAPS capsule; Take 1 capsule (290 mcg total) by mouth daily.  Gastroesophageal reflux disease, esophagitis presence not specified Stable, continue -     famotidine (PEPCID) 20 MG tablet; Take 1 tablet (20 mg total) by mouth 2 (two) times daily.  Hypothyroidism, unspecified type TSH elevated, increase from 112-125 mcg daily -     TSH -     levothyroxine (SYNTHROID, LEVOTHROID) 125 MCG tablet; TAKE ONE (1) TABLET EACH DAY  Hyperlipidemia associated with type 2 diabetes mellitus  (HCC) Elevated LDL, on pravastatin 20 mg, will increase as below -     Lipid panel -     pravastatin (PRAVACHOL) 80 MG tablet; TAKE ONE (1) TABLET EACH DAY  Need for immunization against influenza -     Flu Vaccine QUAD 36+ mos IM   Follow up plan: Return in about 3 months (around 09/17/2017). [name], MD [place]

## 2017-06-19 NOTE — Patient Instructions (Signed)
[name] Appointment: [phone]  

## 2017-06-20 LAB — CMP14+EGFR
ALT: 11 IU/L (ref 0–32)
AST: 18 IU/L (ref 0–40)
Albumin/Globulin Ratio: 2 (ref 1.2–2.2)
Albumin: 4.5 g/dL (ref 3.6–4.8)
Alkaline Phosphatase: 116 IU/L (ref 39–117)
BUN/Creatinine Ratio: 19 (ref 12–28)
BUN: 18 mg/dL (ref 8–27)
Bilirubin Total: 0.2 mg/dL (ref 0.0–1.2)
CALCIUM: 9.7 mg/dL (ref 8.7–10.3)
CO2: 25 mmol/L (ref 20–29)
CREATININE: 0.93 mg/dL (ref 0.57–1.00)
Chloride: 98 mmol/L (ref 96–106)
GFR calc Af Amer: 77 mL/min/{1.73_m2} (ref >59)
GFR, EST NON AFRICAN AMERICAN: 67 mL/min/{1.73_m2} (ref >59)
GLOBULIN, TOTAL: 2.3 g/dL (ref 1.5–4.5)
GLUCOSE: 325 mg/dL — AB (ref 65–99)
Potassium: 4.8 mmol/L (ref 3.5–5.2)
Sodium: 136 mmol/L (ref 134–144)
Total Protein: 6.8 g/dL (ref 6.0–8.5)

## 2017-06-20 LAB — LIPID PANEL
CHOLESTEROL TOTAL: 234 mg/dL — AB (ref 100–199)
Chol/HDL Ratio: 2.7 ratio (ref 0.0–4.4)
HDL: 88 mg/dL (ref >39)
LDL CALC: 125 mg/dL — AB (ref 0–99)
TRIGLYCERIDES: 107 mg/dL (ref 0–149)
VLDL CHOLESTEROL CAL: 21 mg/dL (ref 5–40)

## 2017-06-20 LAB — TSH: TSH: 7.4 u[IU]/mL — AB (ref 0.450–4.500)

## 2017-06-20 MED ORDER — LEVOTHYROXINE SODIUM 125 MCG PO TABS: ORAL_TABLET | ORAL | 0 refills | Status: DC

## 2017-06-20 MED ORDER — PRAVASTATIN SODIUM 80 MG PO TABS: ORAL_TABLET | ORAL | 1 refills | Status: DC

## 2017-06-21 ENCOUNTER — Other Ambulatory Visit: Payer: Self-pay | Admitting: Pediatrics

## 2017-06-21 DIAGNOSIS — E039 Hypothyroidism, unspecified: Secondary | ICD-10-CM

## 2017-07-03 ENCOUNTER — Encounter: Payer: Self-pay | Admitting: Pediatrics

## 2017-07-03 NOTE — Telephone Encounter (Signed)
I can't see that we ordered a urine on this pt? Does she need one?

## 2017-07-03 NOTE — Telephone Encounter (Signed)
Looks like order was not placed for the urine, it was to check for microalbuminuria, checked last year and was normal which is good. We will check time she comes in, sorry for inconvenience.

## 2017-08-02 ENCOUNTER — Ambulatory Visit: Payer: BLUE CROSS/BLUE SHIELD | Admitting: Cardiology

## 2017-08-22 ENCOUNTER — Encounter: Payer: Self-pay | Admitting: Pediatrics

## 2017-08-22 NOTE — Telephone Encounter (Signed)
Please advise 

## 2017-08-26 MED ORDER — "INSULIN SYRINGE-NEEDLE U-100 31G X 1/4"" 1 ML MISC": 1.0000 | Freq: Every day | 1 refills | Status: DC

## 2017-08-26 MED ORDER — INSULIN GLARGINE 100 UNIT/ML ~~LOC~~ SOLN: 22.0000 [IU] | Freq: Every day | SUBCUTANEOUS | 1 refills | Status: DC

## 2017-09-03 ENCOUNTER — Encounter: Payer: Self-pay | Admitting: Pediatrics

## 2017-09-03 ENCOUNTER — Other Ambulatory Visit: Payer: Self-pay | Admitting: Pediatrics

## 2017-09-03 DIAGNOSIS — E039 Hypothyroidism, unspecified: Secondary | ICD-10-CM

## 2017-09-03 NOTE — Telephone Encounter (Signed)
RX changed to vials and syringes per pt request Okayed per Dr Evette Doffing

## 2017-09-03 NOTE — Telephone Encounter (Signed)
rx confirmation already sent over earlier today

## 2017-09-10 ENCOUNTER — Ambulatory Visit (INDEPENDENT_AMBULATORY_CARE_PROVIDER_SITE_OTHER): Payer: Self-pay | Admitting: Cardiology

## 2017-09-10 ENCOUNTER — Other Ambulatory Visit: Payer: Self-pay

## 2017-09-10 ENCOUNTER — Encounter: Payer: Self-pay | Admitting: Cardiology

## 2017-09-10 VITALS — BP 134/79 | HR 70 | Ht 62.0 in | Wt 201.0 lb

## 2017-09-10 DIAGNOSIS — I10 Essential (primary) hypertension: Secondary | ICD-10-CM

## 2017-09-10 DIAGNOSIS — E782 Mixed hyperlipidemia: Secondary | ICD-10-CM

## 2017-09-10 DIAGNOSIS — R0789 Other chest pain: Secondary | ICD-10-CM

## 2017-09-10 NOTE — Progress Notes (Signed)
Clinical Summary Ms. Ranganathan is a 62 y.o.female seen today for follow up of the following medical problems.   1. Chest pain - started off and on x 10 years ago. Squeezing like feeling left sided, 4-5/10. Often occurs at rest. No other associated symptoms. Not positional, but can feel better with deep breaths. Lasts up to 1 hour. Occurs approx once a week. No relation to food. Can have some dysphagia at times that is separate. Has had some increase in frequency, stable severity - can get some DOE, example walking up stairs which is new, though she does report 40 lbs weight gain over the last year. - no LE edema, no orthopnea, no PND.  CAD risk factors: DM2, HL, brother MI age 32.    - 01/2017 nuclear stress no ischemia.  - some improvement with pepcid. Can have reflux.  - referred to GI last visit, does not appear she has seen them  - no recent chest pain. Can have some left arm pain at times. Shoulder down into bicep. Not positional. Can last thoughout night.  - awaiting GI appointment since last visit  2. HTN - remains compliant with meds  3. Hyperlipidemia - 06/2017 TC 234 TG 107 HDL 88 LDL 125 - pravastatin increased at that time.  - compliant with meds   Past Medical History:  Diagnosis Date  . Anemia 07/23/2011  . Anxiety   . Diabetes mellitus   . Diabetic gastroparesis ([place]) 07/23/2011   Suspected  . Headache(784.0)   . Hearing difficulty   . Hyperlipidemia   . Hypothyroidism   . Kidney stones   . Microcytic anemia 07/23/2011  . Thalassemia minor    ????? not really worked up per pt but daughter has it???     Allergies  Allergen Reactions  . Codeine Nausea Only and Other (See Comments)    NAUSEA AND CAUSES HER TO HAVE BAD DREAMS  . Gabapentin     Confusion   . Lisinopril     Coughing     Current Outpatient Medications  Medication Sig Dispense Refill  . aspirin 81 MG tablet Take 81 mg by mouth daily.    Marland Kitchen docusate sodium (COLACE) 100 MG capsule  Take 200 mg by mouth at bedtime.    . famotidine (PEPCID) 20 MG tablet Take 1 tablet (20 mg total) by mouth 2 (two) times daily. 30 tablet 2  . glucose blood (TRUE METRIX BLOOD GLUCOSE TEST) test strip Use as instructed 100 each 12  . insulin aspart (NOVOLOG FLEXPEN) 100 UNIT/ML FlexPen INJECT 10-17 UNITS SQ 3 TIMES DAILY WITH MEALS 15 mL 2  . insulin glargine (LANTUS) 100 UNIT/ML injection Inject 0.22 mLs (22 Units total) into the skin at bedtime. 10 mL 1  . Insulin Pen Needle (PEN NEEDLES) 32G X 4 MM MISC 1 each by Does not apply route 4 (four) times daily. 100 each 12  . Insulin Syringe-Needle U-100 31G X 1/4" 1 ML MISC 1 each by Does not apply route at bedtime. 90 each 1  . levothyroxine (SYNTHROID, LEVOTHROID) 125 MCG tablet TAKE ONE (1) TABLET EACH DAY 90 tablet 0  . linaclotide (LINZESS) 290 MCG CAPS capsule Take 1 capsule (290 mcg total) by mouth daily. 90 capsule 0  . losartan (COZAAR) 25 MG tablet TAKE ONE (1) TABLET EACH DAY 90 tablet 0  . metFORMIN (GLUCOPHAGE) 500 MG tablet Take 1 tablet (500 mg total) by mouth 2 (two) times daily with a meal. 180 tablet 0  .  Multiple Vitamin (MULTIVITAMIN WITH MINERALS) TABS tablet Take 1 tablet by mouth daily.    . pravastatin (PRAVACHOL) 80 MG tablet TAKE ONE (1) TABLET EACH DAY 90 tablet 1  . SUMAtriptan (IMITREX) 100 MG tablet TAKE 1 TABLET AT ONSET OF MIGRAINE HEADACHE MAY REPEAT ONCE IN 2 HOURS (MAX OF 2 TABLETS/24 HOURS) 9 tablet 2   No current facility-administered medications for this visit.      Past Surgical History:  Procedure Laterality Date  . COLONOSCOPY  2004   Dr. Jim Desanctis: small fissue and hemorrhoids  . COLONOSCOPY  12/2011   Dr. Paulita Fujita: normal  . CYSTOSCOPY W/ URETERAL STENT PLACEMENT Right 08/13/2013   Procedure: CYSTOSCOPY WITH RETROGRADE PYELOGRAM/URETERAL STENT PLACEMENT;  Surgeon: Marissa Nestle, MD;  Location: [facility];  Service: Urology;  Laterality: Right;  . EXTRACORPOREAL SHOCK WAVE LITHOTRIPSY Right 08/19/2013    Procedure: EXTRACORPOREAL SHOCK WAVE LITHOTRIPSY (ESWL) RIGHT URETERAL CALCULUS;  Surgeon: Marissa Nestle, MD;  Location: [facility];  Service: Urology;  Laterality: Right;  . WRIST SURGERY Right      Allergies  Allergen Reactions  . Codeine Nausea Only and Other (See Comments)    NAUSEA AND CAUSES HER TO HAVE BAD DREAMS  . Gabapentin     Confusion   . Lisinopril     Coughing      Family History  Problem Relation Age of Onset  . Colon cancer Maternal Grandmother        age 43  . Colon polyps Mother      Social History Ms. Englert reports that  has never smoked. she has never used smokeless tobacco. Ms. Chay reports that she does not drink alcohol.   Review of Systems CONSTITUTIONAL: No weight loss, fever, chills, weakness or fatigue.  HEENT: Eyes: No visual loss, blurred vision, double vision or yellow sclerae.No hearing loss, sneezing, congestion, runny nose or sore throat.  SKIN: No rash or itching.  CARDIOVASCULAR: per hpi RESPIRATORY: No shortness of breath, cough or sputum.  GASTROINTESTINAL: No anorexia, nausea, vomiting or diarrhea. No abdominal pain or blood.  GENITOURINARY: No burning on urination, no polyuria NEUROLOGICAL: No headache, dizziness, syncope, paralysis, ataxia, numbness or tingling in the extremities. No change in bowel or bladder control.  MUSCULOSKELETAL: No muscle, back pain, joint pain or stiffness.  LYMPHATICS: No enlarged nodes. No history of splenectomy.  PSYCHIATRIC: No history of depression or anxiety.  ENDOCRINOLOGIC: No reports of sweating, cold or heat intolerance. No polyuria or polydipsia.  Marland Kitchen   Physical Examination Vitals:   09/10/17 1303  BP: 134/79  Pulse: 70  SpO2: 99%   Vitals:   09/10/17 1303  Weight: 201 lb (91.2 kg)  Height: 5\' 2"  (1.575 m)    Gen: resting comfortably, no acute distress HEENT: no scleral icterus, pupils equal round and reactive, no palptable cervical adenopathy,  CV: RRR, no m/r/g, no  jvd Resp: Clear to auscultation bilaterally GI: abdomen is soft, non-tender, non-distended, normal bowel sounds, no hepatosplenomegaly MSK: extremities are warm, no edema.  Skin: warm, no rash Neuro:  no focal deficits Psych: appropriate affect   Diagnostic Studies 08/2015 [facility]  There was no ST segment deviation noted during stress.  No T wave inversion was noted during stress.  Motion artifact No definite ST changes to suggest ischemia   Finalized by Fay Records, MD on Mon Jan 23, 2016 3:32 PM   There was no ST segment deviation noted during stress.  No T wave inversion was noted during stress.  Pressure rate product is 20, 701. Test at marginal workload For heart rate achieved, there were no ischemic EKG changes Conclusion limited.     08/2015 echo Study Conclusions  - Left ventricle: The cavity size was normal. Wall thickness was at the upper limits of normal. Systolic function was normal. The estimated ejection fraction was in the range of 60% to 65%. Wall motion was normal; there were no regional wall motion abnormalities. Left ventricular diastolic function parameters were normal. - Ascending aorta: The ascending aorta was mildly ectatic. - Mitral valve: There was trivial regurgitation. - Right atrium: Central venous pressure (est): 3 mm Hg. - Tricuspid valve: There was trivial regurgitation. - Pulmonary arteries: PA peak pressure: 23 mm Hg (S). - Pericardium, extracardiac: There was no pericardial effusion.  Impressions:  - Upper normal LV wall thickness with LVEF 60-65% and grossly normal diastolic function. Mildly ectatic ascending aorta. Trivial mitral and tricuspid regurgitation. Estimated PASP 23 mmHg.   01/2017 nuclear stress  No diagnostic ST segment changes to indicate ischemia. Low risk Duke treadmill score 4.5.  Blood pressure demonstrated a hypertensive response to exercise.  Small, mild intensity, fixed anteroapical  defect consistent with breast attenuation.  This is a low risk study.  Nuclear stress EF: 76%.     Assessment and Plan  1. Chest pain - recent negative nuclear stress test.Symptoms improved since last visit with antacid, possible GI etiology - continue to monitor at this time  2. HTN - overall controlled, conitnue current meds   3. Hyperlipidemia - statin recently increased, f/u upcoming pcp labs     F/u 1 year   Arnoldo Lenis, M.D.

## 2017-09-10 NOTE — Patient Instructions (Addendum)

## 2017-09-14 ENCOUNTER — Encounter: Payer: Self-pay | Admitting: Cardiology

## 2017-09-18 ENCOUNTER — Ambulatory Visit: Payer: BLUE CROSS/BLUE SHIELD | Admitting: Pediatrics

## 2017-09-27 ENCOUNTER — Ambulatory Visit: Payer: Self-pay | Admitting: Pediatrics

## 2017-09-30 ENCOUNTER — Ambulatory Visit (INDEPENDENT_AMBULATORY_CARE_PROVIDER_SITE_OTHER): Payer: PPO

## 2017-09-30 ENCOUNTER — Ambulatory Visit (INDEPENDENT_AMBULATORY_CARE_PROVIDER_SITE_OTHER): Payer: PPO | Admitting: Pediatrics

## 2017-09-30 ENCOUNTER — Encounter: Payer: Self-pay | Admitting: Pediatrics

## 2017-09-30 VITALS — BP 124/73 | HR 71 | Temp 97.1°F | Ht 62.0 in | Wt 200.0 lb

## 2017-09-30 DIAGNOSIS — Z794 Long term (current) use of insulin: Secondary | ICD-10-CM | POA: Diagnosis not present

## 2017-09-30 DIAGNOSIS — M25512 Pain in left shoulder: Secondary | ICD-10-CM

## 2017-09-30 DIAGNOSIS — IMO0002 Reserved for concepts with insufficient information to code with codable children: Secondary | ICD-10-CM

## 2017-09-30 DIAGNOSIS — E785 Hyperlipidemia, unspecified: Secondary | ICD-10-CM

## 2017-09-30 DIAGNOSIS — E1165 Type 2 diabetes mellitus with hyperglycemia: Secondary | ICD-10-CM

## 2017-09-30 DIAGNOSIS — K219 Gastro-esophageal reflux disease without esophagitis: Secondary | ICD-10-CM

## 2017-09-30 DIAGNOSIS — G8929 Other chronic pain: Secondary | ICD-10-CM

## 2017-09-30 DIAGNOSIS — K5900 Constipation, unspecified: Secondary | ICD-10-CM | POA: Diagnosis not present

## 2017-09-30 DIAGNOSIS — I10 Essential (primary) hypertension: Secondary | ICD-10-CM | POA: Diagnosis not present

## 2017-09-30 DIAGNOSIS — E118 Type 2 diabetes mellitus with unspecified complications: Secondary | ICD-10-CM

## 2017-09-30 DIAGNOSIS — E1169 Type 2 diabetes mellitus with other specified complication: Secondary | ICD-10-CM

## 2017-09-30 DIAGNOSIS — E039 Hypothyroidism, unspecified: Secondary | ICD-10-CM

## 2017-09-30 LAB — BAYER DCA HB A1C WAIVED: HB A1C: 8.5 % — AB (ref <7.0)

## 2017-09-30 MED ORDER — PRAVASTATIN SODIUM 80 MG PO TABS: ORAL_TABLET | ORAL | 1 refills | Status: DC

## 2017-09-30 MED ORDER — INSULIN GLARGINE 100 UNIT/ML ~~LOC~~ SOLN: 22.0000 [IU] | Freq: Every day | SUBCUTANEOUS | 1 refills | Status: DC

## 2017-09-30 MED ORDER — LEVOTHYROXINE SODIUM 125 MCG PO TABS: ORAL_TABLET | ORAL | 1 refills | Status: DC

## 2017-09-30 MED ORDER — LINACLOTIDE 290 MCG PO CAPS: 290.0000 ug | ORAL_CAPSULE | Freq: Every day | ORAL | 1 refills | Status: DC

## 2017-09-30 MED ORDER — INSULIN ASPART 100 UNIT/ML FLEXPEN: PEN_INJECTOR | SUBCUTANEOUS | 2 refills | Status: DC

## 2017-09-30 MED ORDER — METFORMIN HCL 500 MG PO TABS: 500.0000 mg | ORAL_TABLET | Freq: Two times a day (BID) | ORAL | 1 refills | Status: DC

## 2017-09-30 MED ORDER — LOSARTAN POTASSIUM 25 MG PO TABS: ORAL_TABLET | ORAL | 1 refills | Status: DC

## 2017-09-30 MED ORDER — FAMOTIDINE 20 MG PO TABS: 20.0000 mg | ORAL_TABLET | Freq: Two times a day (BID) | ORAL | 2 refills | Status: DC

## 2017-09-30 NOTE — Progress Notes (Signed)
Subjective:   Patient ID: Melissa Schneider, female    DOB: [birth year], 61 y.o.   MRN: [id number] CC: Follow-up (3 month) and Joint Pain  HPI: Melissa Schneider is a 62 y.o. female presenting for Follow-up (3 month) and Joint Pain  L shoulder pain: Hurting off and on regularly.  She thinks it may be related to what she does, possible that it hurts more when she has done a lot of housework such as cleaning out the closet.  She is not sure.  Does not hurt every week.  Has been coming and going for the last few months.  Was evaluated by her cardiologist last summer for similar pain, did not think related to her heart.  She points to her upper left arm and behind her shoulder with where the pain bothers her when it does come on.  Shoulder is not bothering her right now.  Diabetes: Does not have her blood sugar numbers with her today.  Says she is planning on trying to cut out sugar, gluten, prepackaged foods.  She says she and her husband do sometimes little Debbie cakes.  Hypothyroidism: Taking her medicine regularly  Hyperlipidemia: Taking her cholesterol medicine regularly, no side effects.  Foot pain: Sometimes will have some throbbing in her left ball of her foot and her right pinky toe.  Is usually not bothering her when she is walking.  She mostly notices it at night when she is putting her feet up.  She is not sure if it is related to her shoes or not.  She has been wearing different shoes recently with the weather getting warmer.  Has been ongoing off and on for the last few weeks.  Does not bother her every day.  Relevant past medical, surgical, family and social history reviewed. Allergies and medications reviewed and updated. Social History   Tobacco Use  Smoking Status Never Smoker  Smokeless Tobacco Never Used  Tobacco Comment   Never smoked   ROS: Per HPI   Objective:    BP 124/73   Pulse 71   Temp (!) 97.1 F (36.2 C) (Oral)   Ht 5\' 2"  (1.575 m)   Wt 200 lb (90.7 kg)    BMI 36.58 kg/m   Wt Readings from Last 3 Encounters:  09/30/17 200 lb (90.7 kg)  09/10/17 201 lb (91.2 kg)  06/19/17 201 lb (91.2 kg)    Gen: NAD, alert, cooperative with exam, NCAT EYES: EOMI, no conjunctival injection, or no icterus ENT:  TMs pearly gray b/l, OP without erythema LYMPH: no cervical LAD CV: NRRR, normal S1/S2, no murmur, DP pulses 2+ b/l Resp: CTABL, no wheezes, normal WOB Abd: +BS, soft, NTND. Ext: No edema, warm Neuro: Alert and oriented, strength equal b/l UE and LE, coordination grossly normal MSK: No pain left foot with wiggling her toes, tarsal squeeze.  Right pinky toe slightly pink compared with other toes, no swelling. Left shoulder no pain with internal or external rotation.  Some pain with palpation posterior shoulder.  Some pain with palpation over biceps tendon groove.  Assessment & Plan:  Quinetta was seen today for follow-up and joint pain.  Diagnoses and all orders for this visit:  Uncontrolled type 2 diabetes mellitus with hyperglycemia ([place]) Will bring blood sugar numbers back within the next 2 weeks and adjust medications at that time. -     Bayer DCA Hb A1c Waived -     Microalbumin / creatinine urine ratio -     insulin  glargine (LANTUS) 100 UNIT/ML injection; Inject 0.22 mLs (22 Units total) into the skin at bedtime. -     Amb ref to Medical Nutrition Therapy-MNT  Gastroesophageal reflux disease, esophagitis presence not specified Stable, continue below -     famotidine (PEPCID) 20 MG tablet; Take 1 tablet (20 mg total) by mouth 2 (two) times daily.  Uncontrolled type 2 diabetes mellitus with complication, with long-term current use of insulin (HCC) -     insulin aspart (NOVOLOG FLEXPEN) 100 UNIT/ML FlexPen; INJECT 10-17 UNITS SQ 3 TIMES DAILY WITH MEALS -     metFORMIN (GLUCOPHAGE) 500 MG tablet; Take 1 tablet (500 mg total) by mouth 2 (two) times daily with a meal.  Hypothyroidism, unspecified type Stable, continue below -      levothyroxine (SYNTHROID, LEVOTHROID) 125 MCG tablet; TAKE ONE (1) TABLET EACH DAY -     Thyroid Panel With TSH  Constipation, unspecified constipation type -     linaclotide (LINZESS) 290 MCG CAPS capsule; Take 1 capsule (290 mcg total) by mouth daily.  Essential hypertension, benign -     losartan (COZAAR) 25 MG tablet; TAKE ONE (1) TABLET EACH DAY  Hyperlipidemia associated with type 2 diabetes mellitus (HCC) -     pravastatin (PRAVACHOL) 80 MG tablet; TAKE ONE (1) TABLET EACH DAY  Chronic left shoulder pain No arthritis on x-ray.  Will refer to physical therapy. -     DG Shoulder Left; Future -     Ambulatory referral to Physical Therapy   Follow up plan: Return in about 2 weeks (around 10/14/2017) for diabetes follow up. [name], MD [place]

## 2017-10-01 LAB — THYROID PANEL WITH TSH
Free Thyroxine Index: 2 (ref 1.2–4.9)
T3 Uptake Ratio: 24 % (ref 24–39)
T4, Total: 8.4 ug/dL (ref 4.5–12.0)
TSH: 3.02 u[IU]/mL (ref 0.450–4.500)

## 2017-10-01 LAB — MICROALBUMIN / CREATININE URINE RATIO: CREATININE, UR: 37.7 mg/dL

## 2017-10-14 ENCOUNTER — Ambulatory Visit: Payer: PPO | Admitting: Pediatrics

## 2017-10-24 ENCOUNTER — Other Ambulatory Visit: Payer: Self-pay | Admitting: Pediatrics

## 2017-10-24 NOTE — Telephone Encounter (Signed)
Last seen 09/30/17  Dr Evette Doffing

## 2017-10-28 ENCOUNTER — Ambulatory Visit (INDEPENDENT_AMBULATORY_CARE_PROVIDER_SITE_OTHER): Payer: PPO | Admitting: Pediatrics

## 2017-10-28 ENCOUNTER — Encounter: Payer: Self-pay | Admitting: Pediatrics

## 2017-10-28 VITALS — BP 135/88 | HR 80 | Temp 97.4°F | Ht 62.0 in | Wt 196.8 lb

## 2017-10-28 DIAGNOSIS — I10 Essential (primary) hypertension: Secondary | ICD-10-CM

## 2017-10-28 DIAGNOSIS — E785 Hyperlipidemia, unspecified: Secondary | ICD-10-CM | POA: Diagnosis not present

## 2017-10-28 DIAGNOSIS — E1169 Type 2 diabetes mellitus with other specified complication: Secondary | ICD-10-CM

## 2017-10-28 DIAGNOSIS — E1165 Type 2 diabetes mellitus with hyperglycemia: Secondary | ICD-10-CM

## 2017-10-28 NOTE — Progress Notes (Signed)
  Subjective:   Patient ID: [name], female    DOB: 1955-12-04, 62 y.o.   MRN: [id number] CC: Follow-up (Diabetes)  HPI: Melissa Schneider is a 62 y.o. female presenting for Follow-up (Diabetes)  Diabetes: Blood sugar numbers at home up and down.  When she does not eat sugary foods they are much improved, in the 100s.  Breakfast numbers range from low 100s up to 300.  They tend to be higher when she has ice cream or eats late at night.  Hypertension: Taking medicines regularly.  No chest pain, no shortness of breath.  Hyperlipidemia: Taking pravastatin regularly.  Elevated BMI: Walking back and forth to the mailbox, about 1/4 mile to get their daily.  Relevant past medical, surgical, family and social history reviewed. Allergies and medications reviewed and updated. Social History   Tobacco Use  Smoking Status Never Smoker  Smokeless Tobacco Never Used  Tobacco Comment   Never smoked   ROS: Per HPI   Objective:    BP 135/88   Pulse 80   Temp (!) 97.4 F (36.3 C) (Oral)   Ht 5\' 2"  (1.575 m)   Wt 196 lb 12.8 oz (89.3 kg)   BMI 36.00 kg/m   Wt Readings from Last 3 Encounters:  10/28/17 196 lb 12.8 oz (89.3 kg)  09/30/17 200 lb (90.7 kg)  09/10/17 201 lb (91.2 kg)    Gen: NAD, alert, cooperative with exam, NCAT EYES: EOMI, no conjunctival injection, or no icterus ENT:  OP without erythema LYMPH: no cervical LAD CV: NRRR, normal S1/S2, no murmur, distal pulses 2+ b/l Resp: CTABL, no wheezes, normal WOB Abd: +BS, soft, NTND. no guarding or organomegaly Ext: No edema, warm Neuro: Alert and oriented, strength equal b/l UE and LE, coordination grossly normal MSK: normal muscle bulk  Assessment & Plan:  [name] was seen today for follow-up multiple medical problems.  Diagnoses and all orders for this visit:  Uncontrolled type 2 diabetes mellitus with hyperglycemia (HCC) A1c last check 8.5.  Blood sugar is elevated depending on foods that she eats.  Continue  sliding scale insulin.  Patient has a referral in to see nutrition, having trouble with transportation but will call to reschedule.  Hyperlipidemia associated with type 2 diabetes mellitus ([name]) Continue statin  Essential hypertension, benign Slightly elevated today.  If remains elevated may need increase in medicine next visit.  Elevated BMI Discussed lifestyle changes, goal to walk 5-10 minutes a day to start with, work up to 30 minutes a day.  Follow up plan: Return in about 2 months (around 12/28/2017). [name], MD [place]

## 2017-10-28 NOTE — Patient Instructions (Signed)
Call to schedule eye exam  [place] Appointment: [phone]

## 2017-12-05 ENCOUNTER — Other Ambulatory Visit: Payer: Self-pay | Admitting: *Deleted

## 2017-12-05 ENCOUNTER — Telehealth: Payer: Self-pay | Admitting: Pediatrics

## 2017-12-05 DIAGNOSIS — Z1239 Encounter for other screening for malignant neoplasm of breast: Secondary | ICD-10-CM

## 2017-12-05 NOTE — Telephone Encounter (Signed)
Mammogram ordered

## 2017-12-19 ENCOUNTER — Ambulatory Visit (HOSPITAL_COMMUNITY): Payer: Self-pay

## 2018-01-01 ENCOUNTER — Ambulatory Visit: Payer: PPO | Admitting: Pediatrics

## 2018-03-10 ENCOUNTER — Other Ambulatory Visit: Payer: Self-pay | Admitting: Pediatrics

## 2018-03-10 DIAGNOSIS — E1165 Type 2 diabetes mellitus with hyperglycemia: Secondary | ICD-10-CM

## 2018-04-26 ENCOUNTER — Other Ambulatory Visit: Payer: Self-pay | Admitting: Pediatrics

## 2018-04-26 DIAGNOSIS — E1165 Type 2 diabetes mellitus with hyperglycemia: Secondary | ICD-10-CM

## 2018-04-28 NOTE — Telephone Encounter (Signed)
Patient last seen in April for diabetes check and was to return in 2 months but did not. Please review and advise on RF

## 2018-04-28 NOTE — Telephone Encounter (Signed)
[id number] 10/14

## 2018-05-01 NOTE — Telephone Encounter (Signed)
Left message asking patient to call our office to schedule a diabetic appointment to ensure further refills.

## 2018-05-28 ENCOUNTER — Encounter: Payer: Self-pay | Admitting: Pediatrics

## 2018-05-28 ENCOUNTER — Ambulatory Visit (INDEPENDENT_AMBULATORY_CARE_PROVIDER_SITE_OTHER): Payer: PPO | Admitting: Pediatrics

## 2018-05-28 VITALS — BP 135/81 | HR 75 | Temp 97.8°F | Ht 62.0 in | Wt 195.0 lb

## 2018-05-28 DIAGNOSIS — I10 Essential (primary) hypertension: Secondary | ICD-10-CM | POA: Diagnosis not present

## 2018-05-28 DIAGNOSIS — E1169 Type 2 diabetes mellitus with other specified complication: Secondary | ICD-10-CM | POA: Diagnosis not present

## 2018-05-28 DIAGNOSIS — Z23 Encounter for immunization: Secondary | ICD-10-CM | POA: Diagnosis not present

## 2018-05-28 DIAGNOSIS — R5383 Other fatigue: Secondary | ICD-10-CM | POA: Diagnosis not present

## 2018-05-28 DIAGNOSIS — E785 Hyperlipidemia, unspecified: Secondary | ICD-10-CM

## 2018-05-28 DIAGNOSIS — E039 Hypothyroidism, unspecified: Secondary | ICD-10-CM

## 2018-05-28 DIAGNOSIS — Z794 Long term (current) use of insulin: Secondary | ICD-10-CM | POA: Diagnosis not present

## 2018-05-28 DIAGNOSIS — IMO0002 Reserved for concepts with insufficient information to code with codable children: Secondary | ICD-10-CM

## 2018-05-28 DIAGNOSIS — E118 Type 2 diabetes mellitus with unspecified complications: Secondary | ICD-10-CM | POA: Diagnosis not present

## 2018-05-28 DIAGNOSIS — E1165 Type 2 diabetes mellitus with hyperglycemia: Secondary | ICD-10-CM

## 2018-05-28 LAB — CBC WITH DIFFERENTIAL/PLATELET
BASOS: 1 %
Basophils Absolute: 0.1 10*3/uL (ref 0.0–0.2)
EOS (ABSOLUTE): 0.1 10*3/uL (ref 0.0–0.4)
Eos: 2 %
Hematocrit: 41 % (ref 34.0–46.6)
Hemoglobin: 12.4 g/dL (ref 11.1–15.9)
Immature Grans (Abs): 0 10*3/uL (ref 0.0–0.1)
Immature Granulocytes: 0 %
LYMPHS ABS: 1.9 10*3/uL (ref 0.7–3.1)
Lymphs: 27 %
MCH: 20.7 pg — ABNORMAL LOW (ref 26.6–33.0)
MCHC: 30.2 g/dL — ABNORMAL LOW (ref 31.5–35.7)
MCV: 69 fL — AB (ref 79–97)
MONOS ABS: 0.7 10*3/uL (ref 0.1–0.9)
Monocytes: 10 %
NEUTROS ABS: 4.4 10*3/uL (ref 1.4–7.0)
Neutrophils: 60 %
Platelets: 315 10*3/uL (ref 150–450)
RBC: 5.98 x10E6/uL — ABNORMAL HIGH (ref 3.77–5.28)
RDW: 16.6 % — AB (ref 12.3–15.4)
WBC: 7.3 10*3/uL (ref 3.4–10.8)

## 2018-05-28 LAB — BAYER DCA HB A1C WAIVED: HB A1C: 9 % — AB (ref <7.0)

## 2018-05-28 MED ORDER — LEVOTHYROXINE SODIUM 125 MCG PO TABS: ORAL_TABLET | ORAL | 1 refills | Status: DC

## 2018-05-28 MED ORDER — INSULIN GLARGINE 100 UNIT/ML ~~LOC~~ SOLN: SUBCUTANEOUS | 0 refills | Status: DC

## 2018-05-28 MED ORDER — LOSARTAN POTASSIUM 25 MG PO TABS: ORAL_TABLET | ORAL | 1 refills | Status: DC

## 2018-05-28 MED ORDER — METFORMIN HCL 500 MG PO TABS: 500.0000 mg | ORAL_TABLET | Freq: Two times a day (BID) | ORAL | 1 refills | Status: DC

## 2018-05-28 MED ORDER — PRAVASTATIN SODIUM 80 MG PO TABS: ORAL_TABLET | ORAL | 1 refills | Status: DC

## 2018-05-28 MED ORDER — "INSULIN SYRINGE-NEEDLE U-100 31G X 1/4"" 1 ML MISC": 1.0000 | Freq: Every day | 1 refills | Status: DC

## 2018-05-28 MED ORDER — PEN NEEDLES 32G X 4 MM MISC: 1.0000 | Freq: Four times a day (QID) | 12 refills | Status: DC

## 2018-05-28 MED ORDER — INSULIN ASPART 100 UNIT/ML FLEXPEN: PEN_INJECTOR | SUBCUTANEOUS | 2 refills | Status: DC

## 2018-05-28 MED ORDER — SUMATRIPTAN SUCCINATE 100 MG PO TABS: ORAL_TABLET | ORAL | 2 refills | Status: DC

## 2018-05-28 NOTE — Progress Notes (Signed)
Subjective:   Patient ID: Melissa Schneider, female    DOB: 07/13/1956, 62 y.o.   MRN: [id number] CC: Follow-up multiple medical problems HPI: Melissa Schneider is a 62 y.o. female   Diabetes: Taking medicines regularly.  Blood sugar log again.  Morning blood sugars 200s to 300s other than 2 days of 90 113.  Patient thinks sugars elevated in the morning probably because of eating little Debbie cakes or similar after dinner and after giving evening dose of insulin.  Does take sliding scale insulin regularly.  Hyperlipidemia: Taking medicine regularly, no side effects  Hypothyroidism: Taking medicine regularly, before other medicines.  Fatigue: Noticed it more in the last few weeks.  Has had problems with anemia the distant past.  No longer having periods now.  Had normal colonoscopy in 2013.  Recommended to have colonoscopy every 5 years per report due to family history of polyps.  Hypertension: Taking medicine regularly, no lightheadedness or dizziness.  Relevant past medical, surgical, family and social history reviewed. Allergies and medications reviewed and updated. Social History   Tobacco Use  Smoking Status Never Smoker  Smokeless Tobacco Never Used  Tobacco Comment   Never smoked   ROS: Per HPI   Objective:    BP 135/81   Pulse 75   Temp 97.8 F (36.6 C) (Oral)   Ht '5\' 2"'  (1.575 m)   Wt 195 lb (88.5 kg)   BMI 35.67 kg/m   Wt Readings from Last 3 Encounters:  05/28/18 195 lb (88.5 kg)  10/28/17 196 lb 12.8 oz (89.3 kg)  09/30/17 200 lb (90.7 kg)    Gen: NAD, alert, cooperative with exam, NCAT EYES: EOMI, no conjunctival injection, or no icterus ENT:  TMs pearly gray b/l, OP without erythema LYMPH: no cervical LAD CV: NRRR, normal S1/S2, no murmur, distal pulses 2+ b/l Resp: CTABL, no wheezes, normal WOB Abd: +BS, soft, NTND. no guarding or organomegaly Ext: No edema, warm Neuro: Alert and oriented MSK: normal muscle bulk  Assessment & Plan:  62 year old  female here for follow-up multiple medical problems  Diagnoses and all orders for this visit:  Essential hypertension, benign Stable, continue current medicines -     CMP14+EGFR -     Lipid panel -     Bayer DCA Hb A1c Waived -     Thyroid Panel With TSH -     losartan (COZAAR) 25 MG tablet; TAKE ONE (1) TABLET EACH DAY  Hyperlipidemia associated with type 2 diabetes mellitus (HCC) Stable, continue current medicine -     CMP14+EGFR -     Lipid panel -     Bayer DCA Hb A1c Waived -     Thyroid Panel With TSH -     pravastatin (PRAVACHOL) 80 MG tablet; TAKE ONE (1) TABLET EACH DAY  Hypothyroidism, unspecified type Continue current medicine, will recheck thyroid level -     CMP14+EGFR -     Lipid panel -     Bayer DCA Hb A1c Waived -     Thyroid Panel With TSH -     levothyroxine (SYNTHROID, LEVOTHROID) 125 MCG tablet; TAKE ONE (1) TABLET EACH DAY  Other fatigue -     CBC with Differential  Uncontrolled type 2 diabetes mellitus with complication, with long-term current use of insulin (HCC) Uncontrolled, A1c 9.0.  Eating sugary foods regularly.  Discussed alternative low sugar snacks, avoid late night snacking.  Let me know if morning fasting numbers persistently elevated greater than 180, will need  to adjust nighttime insulin. -     insulin aspart (NOVOLOG FLEXPEN) 100 UNIT/ML FlexPen; INJECT 10-17 UNITS SQ 3 TIMES DAILY WITH MEALS -     insulin glargine (LANTUS) 100 UNIT/ML injection; 22 u at bedtime SQ. Must be seen for more refills. -     Insulin Pen Needle (PEN NEEDLES) 32G X 4 MM MISC; 1 each by Does not apply route 4 (four) times daily. -     Insulin Syringe-Needle U-100 31G X 1/4" 1 ML MISC; 1 each by Does not apply route at bedtime. -     metFORMIN (GLUCOPHAGE) 500 MG tablet; Take 1 tablet (500 mg total) by mouth 2 (two) times daily with a meal.  Need for immunization against influenza -     Flu Vaccine QUAD 36+ mos IM  Migraines Rarely occurring, below helps when they  do happen. -     SUMAtriptan (IMITREX) 100 MG tablet; TAKE 1 TABLET AT ONSET OF MIGRAINE HEADACHE MAY REPEAT ONCE IN 2 HOURS (MAX OF 2 TABLETS/24 HOURS)   Follow up plan: Return in about 3 months (around 08/28/2018). [name], MD [place]

## 2018-05-28 NOTE — Patient Instructions (Addendum)
[place] Appointment: [phone]  Call to set up eye exam if you ahvent had one in past 12 months.

## 2018-05-29 ENCOUNTER — Other Ambulatory Visit: Payer: Self-pay | Admitting: Pediatrics

## 2018-05-29 DIAGNOSIS — E039 Hypothyroidism, unspecified: Secondary | ICD-10-CM

## 2018-05-29 LAB — LIPID PANEL
CHOL/HDL RATIO: 3 ratio (ref 0.0–4.4)
Cholesterol, Total: 260 mg/dL — ABNORMAL HIGH (ref 100–199)
HDL: 87 mg/dL (ref >39)
LDL CALC: 154 mg/dL — AB (ref 0–99)
Triglycerides: 97 mg/dL (ref 0–149)
VLDL Cholesterol Cal: 19 mg/dL (ref 5–40)

## 2018-05-29 LAB — THYROID PANEL WITH TSH
Free Thyroxine Index: 2 (ref 1.2–4.9)
T3 UPTAKE RATIO: 24 % (ref 24–39)
T4 TOTAL: 8.5 ug/dL (ref 4.5–12.0)
TSH: 8.5 u[IU]/mL — ABNORMAL HIGH (ref 0.450–4.500)

## 2018-05-29 LAB — CMP14+EGFR
A/G RATIO: 1.7 (ref 1.2–2.2)
ALT: 9 IU/L (ref 0–32)
AST: 13 IU/L (ref 0–40)
Albumin: 4.5 g/dL (ref 3.6–4.8)
Alkaline Phosphatase: 113 IU/L (ref 39–117)
BUN/Creatinine Ratio: 14 (ref 12–28)
BUN: 13 mg/dL (ref 8–27)
Bilirubin Total: 0.2 mg/dL (ref 0.0–1.2)
CALCIUM: 9.7 mg/dL (ref 8.7–10.3)
CO2: 23 mmol/L (ref 20–29)
CREATININE: 0.93 mg/dL (ref 0.57–1.00)
Chloride: 103 mmol/L (ref 96–106)
GFR, EST AFRICAN AMERICAN: 77 mL/min/{1.73_m2} (ref >59)
GFR, EST NON AFRICAN AMERICAN: 67 mL/min/{1.73_m2} (ref >59)
Globulin, Total: 2.6 g/dL (ref 1.5–4.5)
Glucose: 68 mg/dL (ref 65–99)
POTASSIUM: 4 mmol/L (ref 3.5–5.2)
SODIUM: 144 mmol/L (ref 134–144)
TOTAL PROTEIN: 7.1 g/dL (ref 6.0–8.5)

## 2018-05-29 MED ORDER — LEVOTHYROXINE SODIUM 137 MCG PO TABS: ORAL_TABLET | ORAL | 1 refills | Status: DC

## 2018-06-01 LAB — SPECIMEN STATUS REPORT

## 2018-06-01 LAB — FERRITIN: FERRITIN: 45 ng/mL (ref 15–150)

## 2018-07-22 ENCOUNTER — Other Ambulatory Visit: Payer: Self-pay | Admitting: Pediatrics

## 2018-07-22 DIAGNOSIS — IMO0002 Reserved for concepts with insufficient information to code with codable children: Secondary | ICD-10-CM

## 2018-07-22 DIAGNOSIS — E1165 Type 2 diabetes mellitus with hyperglycemia: Secondary | ICD-10-CM

## 2018-07-22 DIAGNOSIS — E118 Type 2 diabetes mellitus with unspecified complications: Principal | ICD-10-CM

## 2018-07-22 DIAGNOSIS — Z794 Long term (current) use of insulin: Principal | ICD-10-CM

## 2018-07-23 NOTE — Telephone Encounter (Signed)
OV 08/28/18

## 2018-08-25 ENCOUNTER — Other Ambulatory Visit: Payer: Self-pay | Admitting: Pediatrics

## 2018-08-25 DIAGNOSIS — E1165 Type 2 diabetes mellitus with hyperglycemia: Secondary | ICD-10-CM

## 2018-08-25 DIAGNOSIS — IMO0002 Reserved for concepts with insufficient information to code with codable children: Secondary | ICD-10-CM

## 2018-08-25 DIAGNOSIS — E118 Type 2 diabetes mellitus with unspecified complications: Principal | ICD-10-CM

## 2018-08-25 DIAGNOSIS — Z794 Long term (current) use of insulin: Principal | ICD-10-CM

## 2018-08-28 ENCOUNTER — Ambulatory Visit: Payer: PPO | Admitting: Pediatrics

## 2018-09-01 ENCOUNTER — Encounter: Payer: Self-pay | Admitting: Family Medicine

## 2018-09-01 ENCOUNTER — Ambulatory Visit (INDEPENDENT_AMBULATORY_CARE_PROVIDER_SITE_OTHER): Payer: PPO | Admitting: Family Medicine

## 2018-09-01 VITALS — BP 127/74 | HR 78 | Temp 98.1°F | Ht 62.0 in | Wt 191.0 lb

## 2018-09-01 DIAGNOSIS — K3184 Gastroparesis: Secondary | ICD-10-CM | POA: Diagnosis not present

## 2018-09-01 DIAGNOSIS — E1165 Type 2 diabetes mellitus with hyperglycemia: Secondary | ICD-10-CM

## 2018-09-01 DIAGNOSIS — F329 Major depressive disorder, single episode, unspecified: Secondary | ICD-10-CM | POA: Diagnosis not present

## 2018-09-01 DIAGNOSIS — R4589 Other symptoms and signs involving emotional state: Secondary | ICD-10-CM

## 2018-09-01 DIAGNOSIS — E1159 Type 2 diabetes mellitus with other circulatory complications: Secondary | ICD-10-CM | POA: Diagnosis not present

## 2018-09-01 DIAGNOSIS — I10 Essential (primary) hypertension: Secondary | ICD-10-CM

## 2018-09-01 DIAGNOSIS — E785 Hyperlipidemia, unspecified: Secondary | ICD-10-CM | POA: Diagnosis not present

## 2018-09-01 DIAGNOSIS — E038 Other specified hypothyroidism: Secondary | ICD-10-CM | POA: Diagnosis not present

## 2018-09-01 DIAGNOSIS — Z794 Long term (current) use of insulin: Secondary | ICD-10-CM | POA: Diagnosis not present

## 2018-09-01 DIAGNOSIS — E1169 Type 2 diabetes mellitus with other specified complication: Secondary | ICD-10-CM | POA: Insufficient documentation

## 2018-09-01 DIAGNOSIS — E1143 Type 2 diabetes mellitus with diabetic autonomic (poly)neuropathy: Secondary | ICD-10-CM

## 2018-09-01 DIAGNOSIS — E039 Hypothyroidism, unspecified: Secondary | ICD-10-CM

## 2018-09-01 DIAGNOSIS — I152 Hypertension secondary to endocrine disorders: Secondary | ICD-10-CM

## 2018-09-01 DIAGNOSIS — IMO0002 Reserved for concepts with insufficient information to code with codable children: Secondary | ICD-10-CM

## 2018-09-01 DIAGNOSIS — E118 Type 2 diabetes mellitus with unspecified complications: Secondary | ICD-10-CM

## 2018-09-01 LAB — BAYER DCA HB A1C WAIVED: HB A1C (BAYER DCA - WAIVED): 8.9 % — ABNORMAL HIGH (ref <7.0)

## 2018-09-01 MED ORDER — LOSARTAN POTASSIUM 25 MG PO TABS: ORAL_TABLET | ORAL | 1 refills | Status: DC

## 2018-09-01 MED ORDER — ATORVASTATIN CALCIUM 20 MG PO TABS: 20.0000 mg | ORAL_TABLET | Freq: Every day | ORAL | 3 refills | Status: DC

## 2018-09-01 MED ORDER — METFORMIN HCL 500 MG PO TABS: 500.0000 mg | ORAL_TABLET | Freq: Two times a day (BID) | ORAL | 1 refills | Status: DC

## 2018-09-01 MED ORDER — INSULIN GLARGINE 100 UNIT/ML ~~LOC~~ SOLN: 22.0000 [IU] | Freq: Every day | SUBCUTANEOUS | 6 refills | Status: DC

## 2018-09-01 NOTE — Patient Instructions (Signed)
You had labs performed today.  You will be contacted with the results of the labs once they are available, usually in the next 3 business days for routine lab work.    For your diabetes:  Continue checking your blood sugar daily as you have been and record.  We discussed increasing the Lantus by 1 unit every 2 days for fasting morning blood sugars greater than 150.  I would like you to resume use of the metformin and cholesterol medicine.  I have changed your cholesterol medicine to atorvastatin.  Take 1 tablet every night at bedtime.  For your mood: If you decide that you continue to have symptoms despite getting your affairs in order, let me know.  We can certainly discuss an antidepressant medication.  See me back in 3 months.

## 2018-09-01 NOTE — Progress Notes (Signed)
Subjective: CC: DM2, HTN, HLD PCP: Janora Norlander, DO LEX:[id number] R Scinto is a 63 y.o. female presenting to clinic today for:  1. Type 2 Diabetes w/ HTN, HLD:  Patient reports that she discontinued Metformin, Cozaar, Pravachol 3 months ago because she was trying to reduce the number of meds she was taking.  She reports ongoing use of Lantus 22u qhs and Novolog 10u +SSI. High at home: 416 (this was fasting); Low at home: 44 (also fasting).  Avg FBG 150-200s.  She denies having ever been on a different statin than Pravachol.  She has some myalgia at baseline that is chronic for her.  Last eye exam: Due Last foot exam: 09/2017 Last A1c:  Lab Results  Component Value Date   HGBA1C 9.0 (H) 05/28/2018   Nephropathy screen indicated?:  No.  Wants to resume ARB. Last flu, zoster and/or pneumovax:  Immunization History  Administered Date(s) Administered  . Influenza Whole 05/16/2010  . Influenza,inj,Quad PF,6+ Mos 05/10/2015, 04/26/2016, 06/19/2017, 05/28/2018  . Pneumococcal Polysaccharide-23 07/16/2008, 01/30/2016  . Td 07/17/2007    ROS: [place] LOC, polyuria, polydipsia, unintended weight loss/gain, foot ulcerations, numbness or tingling in extremities or chest pain.  2. Hypothyroidism/ depressed mood. Patient reports several history of hypothyroidism.  She notes that this was found on routine lab work.  Denies any history of thyroid surgery or radiation to the neck.  She denies any constipation, diarrhea, tremor, insomnia, heart palpitations, difficulty swallowing or change in voice.  No visual disturbance.  She does have some depressed mood but is unsure if this is related to being overwhelmed from various issues going on in her life, which include her mother being in [place] and having to deal with her home, which requires renovations.  Additionally, she reports some financial stressors at home with the affordability of her husband's medications and trying to move into a  rental.  Not currently on any antidepressants.  She notes that she "would do better if she could just get organized".  ROS: Per HPI  Allergies  Allergen Reactions  . Codeine Nausea Only and Other (See Comments)    NAUSEA AND CAUSES HER TO HAVE BAD DREAMS  . Gabapentin     Confusion   . Lisinopril     Coughing   Past Medical History:  Diagnosis Date  . Anemia 07/23/2011  . Anxiety   . Diabetes mellitus   . Diabetic gastroparesis ([place]) 07/23/2011   Suspected  . Headache(784.0)   . Hearing difficulty   . Hyperlipidemia   . Hypothyroidism   . Kidney stones   . Microcytic anemia 07/23/2011  . Thalassemia minor    ????? not really worked up per pt but daughter has it???    Current Outpatient Medications:  .  aspirin 81 MG tablet, Take 81 mg by mouth daily., Disp: , Rfl:  .  docusate sodium (COLACE) 100 MG capsule, Take 200 mg by mouth daily as needed. , Disp: , Rfl:  .  famotidine (PEPCID) 20 MG tablet, Take 1 tablet (20 mg total) by mouth 2 (two) times daily. (Patient taking differently: Take 20 mg by mouth 2 (two) times daily as needed. ), Disp: 30 tablet, Rfl: 2 .  glucose blood (TRUE METRIX BLOOD GLUCOSE TEST) test strip, Use as instructed, Disp: 100 each, Rfl: 12 .  insulin aspart (NOVOLOG FLEXPEN) 100 UNIT/ML FlexPen, INJECT 10-17 UNITS SQ 3 TIMES DAILY WITH MEALS, Disp: 15 mL, Rfl: 2 .  insulin glargine (LANTUS) 100 UNIT/ML injection, INJECT  22 UNITS SQ AT BEDTIME, Disp: 10 mL, Rfl: 0 .  Insulin Pen Needle (PEN NEEDLES) 32G X 4 MM MISC, 1 each by Does not apply route 4 (four) times daily., Disp: 100 each, Rfl: 12 .  Insulin Syringe-Needle U-100 31G X 1/4" 1 ML MISC, 1 each by Does not apply route at bedtime., Disp: 90 each, Rfl: 1 .  levothyroxine (SYNTHROID, LEVOTHROID) 137 MCG tablet, TAKE ONE (1) TABLET EACH DAY, Disp: 90 tablet, Rfl: 1 .  losartan (COZAAR) 25 MG tablet, TAKE ONE (1) TABLET EACH DAY (Patient not taking: Reported on 09/01/2018), Disp: 90 tablet, Rfl: 1 .   metFORMIN (GLUCOPHAGE) 500 MG tablet, Take 1 tablet (500 mg total) by mouth 2 (two) times daily with a meal. (Patient not taking: Reported on 09/01/2018), Disp: 180 tablet, Rfl: 1 .  Multiple Vitamin (MULTIVITAMIN WITH MINERALS) TABS tablet, Take 1 tablet by mouth daily., Disp: , Rfl:  .  pravastatin (PRAVACHOL) 80 MG tablet, TAKE ONE (1) TABLET EACH DAY (Patient not taking: Reported on 09/01/2018), Disp: 90 tablet, Rfl: 1 .  SUMAtriptan (IMITREX) 100 MG tablet, TAKE 1 TABLET AT ONSET OF MIGRAINE HEADACHE MAY REPEAT ONCE IN 2 HOURS (MAX OF 2 TABLETS/24 HOURS), Disp: 9 tablet, Rfl: 2 Social History   Socioeconomic History  . Marital status: Married    Spouse name: Not on file  . Number of children: 1  . Years of education: Not on file  . Highest education level: Not on file  Occupational History  . Occupation: Scientific laboratory technician: [employer]  . Financial resource strain: Not on file  . Food insecurity:    Worry: Not on file    Inability: Not on file  . Transportation needs:    Medical: Not on file    Non-medical: Not on file  Tobacco Use  . Smoking status: Never Smoker  . Smokeless tobacco: Never Used  . Tobacco comment: Never smoked  Substance and Sexual Activity  . Alcohol use: No    Alcohol/week: 0.0 standard drinks  . Drug use: No  . Sexual activity: Not on file  Lifestyle  . Physical activity:    Days per week: Not on file    Minutes per session: Not on file  . Stress: Not on file  Relationships  . Social connections:    Talks on phone: Not on file    Gets together: Not on file    Attends religious service: Not on file    Active member of club or organization: Not on file    Attends meetings of clubs or organizations: Not on file    Relationship status: Not on file  . Intimate partner violence:    Fear of current or ex partner: Not on file    Emotionally abused: Not on file    Physically abused: Not on file    Forced sexual  activity: Not on file  Other Topics Concern  . Not on file  Social History Narrative  . Not on file   Family History  Problem Relation Age of Onset  . Colon cancer Maternal Grandmother        age 79  . Colon polyps Mother     Objective: Office vital signs reviewed. BP 127/74   Pulse 78   Temp 98.1 F (36.7 C) (Oral)   Ht 5\' 2"  (1.575 m)   Wt 191 lb (86.6 kg)   BMI 34.93 kg/m   Physical Examination:  General:  Awake, alert, well nourished, No acute distress HEENT: Normal.  Sclera white.  No exophthalmos Cardio: regular rate and rhythm, S1S2 heard, no murmurs appreciated Pulm: clear to auscultation bilaterally, no wheezes, rhonchi or rales; normal work of breathing on room air Extremities: warm, well perfused, No edema, cyanosis or clubbing; +2 pulses bilaterally Skin: Normal temperature Neuro: No tremor.  Follows all commands.  No focal neurologic deficits. Psych: Pleasant and interactive.  Mood is depressed.  Affect appropriate.  Good eye contact.  Does not appear to be responding to internal stimuli. Depression screen [facility] 2/9 09/01/2018 05/28/2018 10/28/2017  Decreased Interest 0 1 0  Down, Depressed, Hopeless 0 1 0  PHQ - 2 Score 0 2 0  Altered sleeping 0 1 -  Tired, decreased energy 1 1 -  Change in appetite 0 0 -  Feeling bad or failure about yourself  1 1 -  Trouble concentrating 0 0 -  Moving slowly or fidgety/restless 0 0 -  Suicidal thoughts 0 0 -  PHQ-9 Score 2 5 -  Difficult doing work/chores - Somewhat difficult -   Assessment/ Plan: 63 y.o. female   1. Uncontrolled type 2 diabetes mellitus with complication, with long-term current use of insulin ([place]) Continues to be uncontrolled but patient has not been compliant with medications.  We discussed resuming use of metformin.  Insulin also discussed.  She will titrate her Lantus up by 1 unit every 2 days for fasting blood sugars greater than 150.  Teach back method performed today.  Insulin has also been sent  to the pharmacy.  She needs to get her diabetic eye exam done but this is likely not going to happen before next appointment because she has multiple issues at home at this point that she is try to juggle. - Bayer DCA Hb A1c Waived - insulin glargine (LANTUS) 100 UNIT/ML injection; Inject 0.22-0.3 mLs (22-30 Units total) into the skin at bedtime. INJECT 22 UNITS SQ AT BEDTIME  Dispense: 10 mL; Refill: 6 - metFORMIN (GLUCOPHAGE) 500 MG tablet; Take 1 tablet (500 mg total) by mouth 2 (two) times daily with a meal.  Dispense: 180 tablet; Refill: 1  2. Hypertension associated with diabetes ([place]) Controlled.  We discussed that she may not need to resume the losartan if this was not started for hypertension.  She wants to go back on the medicine so I have asked that she monitor the blood pressures very closely.  If she has blood pressures dropping below 110 over 60s, she will need to discontinue medicine totally.  She voiced good understanding  3. Hyperlipidemia associated with type 2 diabetes mellitus ([facility]) Start atorvastatin 20 mg.  No history of intolerance to Lipitor.  4. Diabetic gastroparesis ([place]) As above.  Poorly controlled diabetes at this point.  5. Acquired hypothyroidism Check thyroid panel.  Will adjust Synthroid dose based on lab. - Thyroid Panel With TSH  6. Depressed mood We discussed depressed mood today.  She does not seem ready to start any medications.  I think that once she gets her affairs in order, her symptoms may improve some.  We discussed that she may come back for discussion at any time if she changes her mind.  Would consider Prozac if she desires in the future.  Orders Placed This Encounter  Procedures  . Bayer DCA Hb A1c Waived  . Thyroid Panel With TSH   Meds ordered this encounter  Medications  . insulin glargine (LANTUS) 100 UNIT/ML injection    Sig: Inject  0.22-0.3 mLs (22-30 Units total) into the skin at bedtime. INJECT 22 UNITS SQ AT BEDTIME    Dispense:   10 mL    Refill:  6  . losartan (COZAAR) 25 MG tablet    Sig: TAKE ONE (1) TABLET EACH DAY    Dispense:  90 tablet    Refill:  1  . metFORMIN (GLUCOPHAGE) 500 MG tablet    Sig: Take 1 tablet (500 mg total) by mouth 2 (two) times daily with a meal.    Dispense:  180 tablet    Refill:  1  . atorvastatin (LIPITOR) 20 MG tablet    Sig: Take 1 tablet (20 mg total) by mouth daily.    Dispense:  90 tablet    Refill:  [place], [place] [phone]

## 2018-09-02 LAB — THYROID PANEL WITH TSH
Free Thyroxine Index: 3.2 (ref 1.2–4.9)
T3 UPTAKE RATIO: 31 % (ref 24–39)
T4 TOTAL: 10.2 ug/dL (ref 4.5–12.0)
TSH: 0.27 u[IU]/mL — AB (ref 0.450–4.500)

## 2018-09-02 MED ORDER — LEVOTHYROXINE SODIUM 125 MCG PO TABS: 125.0000 ug | ORAL_TABLET | Freq: Every day | ORAL | 1 refills | Status: DC

## 2018-09-02 NOTE — Addendum Note (Signed)
Addended by: Shelbie Ammons on: 09/02/2018 09:09 AM   Modules accepted: Orders

## 2018-09-09 ENCOUNTER — Other Ambulatory Visit: Payer: Self-pay | Admitting: Pediatrics

## 2018-09-09 DIAGNOSIS — Z794 Long term (current) use of insulin: Principal | ICD-10-CM

## 2018-09-09 DIAGNOSIS — E1165 Type 2 diabetes mellitus with hyperglycemia: Secondary | ICD-10-CM

## 2018-09-09 DIAGNOSIS — E118 Type 2 diabetes mellitus with unspecified complications: Principal | ICD-10-CM

## 2018-09-09 DIAGNOSIS — IMO0002 Reserved for concepts with insufficient information to code with codable children: Secondary | ICD-10-CM

## 2018-09-26 ENCOUNTER — Other Ambulatory Visit: Payer: Self-pay | Admitting: Pediatrics

## 2018-09-26 DIAGNOSIS — E118 Type 2 diabetes mellitus with unspecified complications: Principal | ICD-10-CM

## 2018-09-26 DIAGNOSIS — IMO0002 Reserved for concepts with insufficient information to code with codable children: Secondary | ICD-10-CM

## 2018-09-26 DIAGNOSIS — Z794 Long term (current) use of insulin: Principal | ICD-10-CM

## 2018-09-26 DIAGNOSIS — E1165 Type 2 diabetes mellitus with hyperglycemia: Secondary | ICD-10-CM

## 2018-12-01 ENCOUNTER — Other Ambulatory Visit: Payer: Self-pay

## 2018-12-01 ENCOUNTER — Ambulatory Visit (INDEPENDENT_AMBULATORY_CARE_PROVIDER_SITE_OTHER): Payer: PPO | Admitting: Family Medicine

## 2018-12-01 DIAGNOSIS — E039 Hypothyroidism, unspecified: Secondary | ICD-10-CM | POA: Diagnosis not present

## 2018-12-01 DIAGNOSIS — E1169 Type 2 diabetes mellitus with other specified complication: Secondary | ICD-10-CM

## 2018-12-01 DIAGNOSIS — E1165 Type 2 diabetes mellitus with hyperglycemia: Secondary | ICD-10-CM

## 2018-12-01 DIAGNOSIS — E785 Hyperlipidemia, unspecified: Secondary | ICD-10-CM | POA: Diagnosis not present

## 2018-12-01 NOTE — Progress Notes (Signed)
Telephone visit  Subjective: CC: f/u DM/HTN PCP: Janora Norlander, DO ZHY:[id number] Melissa Schneider is a 63 y.o. female calls for telephone consult today. Patient provides verbal consent for consult held via phone.  Location of patient: home Location of provider: [facility] Others present for call: none  1. Type 2 Diabetes w/ HLD:  Patient reports:  High at home: 310 (forgot to take her shot); Low at home: 100, Most FBG: 135-150.  Taking medication(s): Lantus 24 units nightly, 10 units of NovoLog with meals and sliding scale insulin.  She reports compliance with the atorvastatin 20 mg daily  Last eye exam: Needs Last foot exam: Needs Last A1c:  Lab Results  Component Value Date   HGBA1C 8.9 (H) 09/01/2018   Nephropathy screen indicated?:  On ACE inhibitor Last flu, zoster and/or pneumovax:  Immunization History  Administered Date(s) Administered  . Influenza Whole 05/16/2010  . Influenza,inj,Quad PF,6+ Mos 05/10/2015, 04/26/2016, 06/19/2017, 05/28/2018  . Pneumococcal Polysaccharide-23 07/16/2008, 01/30/2016  . Td 07/17/2007    ROS: No chest pain, shortness of breath.  2.  Hypothyroidism Patient compliant with Synthroid.  She does not report any palpitations, change in weight.    ROS: Per HPI  Allergies  Allergen Reactions  . Codeine Nausea Only and Other (See Comments)    NAUSEA AND CAUSES HER TO HAVE BAD DREAMS  . Gabapentin     Confusion   . Lisinopril     Coughing   Past Medical History:  Diagnosis Date  . Anemia 07/23/2011  . Anxiety   . Diabetes mellitus   . Diabetic gastroparesis ([place]) 07/23/2011   Suspected  . Headache(784.0)   . Hearing difficulty   . Hyperlipidemia   . Hypothyroidism   . Kidney stones   . Microcytic anemia 07/23/2011  . Thalassemia minor    ????? not really worked up per pt but daughter has it???    Current Outpatient Medications:  .  aspirin 81 MG tablet, Take 81 mg by mouth daily., Disp: , Rfl:  .  atorvastatin (LIPITOR) 20 MG tablet,  Take 1 tablet (20 mg total) by mouth daily., Disp: 90 tablet, Rfl: 3 .  docusate sodium (COLACE) 100 MG capsule, Take 200 mg by mouth daily as needed. , Disp: , Rfl:  .  famotidine (PEPCID) 20 MG tablet, Take 1 tablet (20 mg total) by mouth 2 (two) times daily. (Patient taking differently: Take 20 mg by mouth 2 (two) times daily as needed. ), Disp: 30 tablet, Rfl: 2 .  glucose blood (GNP EASY TOUCH GLUCOSE TEST) test strip, Test sugars daily, Disp: 100 each, Rfl: 3 .  insulin glargine (LANTUS) 100 UNIT/ML injection, Inject 0.22-0.3 mLs (22-30 Units total) into the skin at bedtime. INJECT 22 UNITS SQ AT BEDTIME, Disp: 10 mL, Rfl: 6 .  Insulin Pen Needle (PEN NEEDLES) 32G X 4 MM MISC, 1 each by Does not apply route 4 (four) times daily., Disp: 100 each, Rfl: 12 .  Insulin Syringe-Needle U-100 31G X 1/4" 1 ML MISC, 1 each by Does not apply route at bedtime., Disp: 90 each, Rfl: 1 .  levothyroxine (SYNTHROID, LEVOTHROID) 125 MCG tablet, Take 1 tablet (125 mcg total) by mouth daily., Disp: 90 tablet, Rfl: 1 .  losartan (COZAAR) 25 MG tablet, TAKE ONE (1) TABLET EACH DAY, Disp: 90 tablet, Rfl: 1 .  metFORMIN (GLUCOPHAGE) 500 MG tablet, Take 1 tablet (500 mg total) by mouth 2 (two) times daily with a meal., Disp: 180 tablet, Rfl: 1 .  Multiple Vitamin (  MULTIVITAMIN WITH MINERALS) TABS tablet, Take 1 tablet by mouth daily., Disp: , Rfl:  .  NOVOLOG FLEXPEN 100 UNIT/ML FlexPen, INJECT 10-17 UNITS SQ 3 TIMES DAILY WITH MEALS, Disp: 15 mL, Rfl: 1 .  SUMAtriptan (IMITREX) 100 MG tablet, TAKE 1 TABLET AT ONSET OF MIGRAINE HEADACHE MAY REPEAT ONCE IN 2 HOURS (MAX OF 2 TABLETS/24 HOURS), Disp: 9 tablet, Rfl: 2  Assessment/ Plan: 63 y.o. female   1. Uncontrolled type 2 diabetes mellitus with hyperglycemia ([place]) From her reports, it sounds like her blood sugars are getting closer to goal.  We will plan to see each other mid July.  This appointment has been scheduled.  At that time, plan for A1c and thyroid  panel.  2. Hyperlipidemia associated with type 2 diabetes mellitus ([place]) Compliant with atorvastatin.  No refills needed  3. Acquired hypothyroidism Compliant with medication.  Plan for TSH at next visit   Start time: attempted to reach at 10:06am, 10:09am End time: 10:18am  Total time spent on patient care (including telephone call/ virtual visit): 15 minutes  [place], [place] [phone]

## 2018-12-03 ENCOUNTER — Other Ambulatory Visit: Payer: Self-pay | Admitting: Pediatrics

## 2018-12-03 ENCOUNTER — Other Ambulatory Visit: Payer: Self-pay | Admitting: Family Medicine

## 2018-12-03 DIAGNOSIS — E1165 Type 2 diabetes mellitus with hyperglycemia: Secondary | ICD-10-CM

## 2018-12-03 DIAGNOSIS — IMO0002 Reserved for concepts with insufficient information to code with codable children: Secondary | ICD-10-CM

## 2018-12-30 ENCOUNTER — Ambulatory Visit: Payer: PPO

## 2019-01-09 ENCOUNTER — Telehealth: Payer: Self-pay | Admitting: Family Medicine

## 2019-01-12 ENCOUNTER — Other Ambulatory Visit: Payer: Self-pay

## 2019-01-12 ENCOUNTER — Ambulatory Visit (INDEPENDENT_AMBULATORY_CARE_PROVIDER_SITE_OTHER): Payer: PPO | Admitting: *Deleted

## 2019-01-12 VITALS — Ht 62.0 in | Wt 191.0 lb

## 2019-01-12 DIAGNOSIS — Z0000 Encounter for general adult medical examination without abnormal findings: Secondary | ICD-10-CM

## 2019-01-12 NOTE — Progress Notes (Signed)
MEDICARE ANNUAL WELLNESS VISIT  01/12/2019  Telephone Visit Disclaimer This Medicare AWV was conducted by telephone due to national recommendations for restrictions regarding the COVID-19 Pandemic (e.g. social distancing).  I verified, using two identifiers, that I am speaking with Melissa Schneider or their authorized healthcare agent. I discussed the limitations, risks, security, and privacy concerns of performing an evaluation and management service by telephone and the potential availability of an in-person appointment in the future. The patient expressed understanding and agreed to proceed.   Subjective:  Melissa Schneider is a 63 y.o. female patient of Janora Norlander, DO who had a Medicare Annual Wellness Visit today via telephone. Cadey is Retired and lives with their spouse. she has 1 child. she reports that she is socially active and does interact with friends/family regularly. she is minimally physically active and enjoys reading.  Patient Care Team: Janora Norlander, DO as PCP - General (Family Medicine) Daneil Dolin, MD as Consulting Physician (Gastroenterology)  Advanced Directives 01/12/2019 01/12/2019 04/29/2015 08/22/2013 08/13/2013 07/20/2011  Does Patient Have a Medical Advance Directive? No No No Patient does not have advance directive;Patient would not like information Patient does not have advance directive;Patient would like information Patient does not have advance directive  Would patient like information on creating a medical advance directive? Yes (MAU/Ambulatory/Procedural Areas - Information given) Yes (MAU/Ambulatory/Procedural Areas - Information given) No - patient declined information - Advance directive packet given -  Pre-existing out of facility DNR order (yellow form or pink MOST form) - - - No No No    Hospital Utilization Over the Past 12 Months: # of hospitalizations or ER visits: 0 # of surgeries: 0  Review of Systems    Patient reports  that her overall health is unchanged compared to last year.  Patient Reported Readings (BP, Pulse, CBG, Weight, etc) CBG-159    Review of Systems: History obtained from chart review and the patient General ROS: negative Ophthalmic ROS: positive for - uses glasses and Vision changes Musculoskeletal ROS: positive for - joint pain  All other systems negative.  Pain Assessment Pain : 0-10 Pain Type: Chronic pain Pain Location: Other (Comment)(Joint pain) Pain Orientation: Left, Right(Fingers and Toes) Pain Descriptors / Indicators: Aching Pain Onset: More than a month ago Pain Frequency: Constant Pain Relieving Factors: Tylenol Arthritis  Pain Relieving Factors: Tylenol Arthritis  Current Medications & Allergies (verified) Allergies as of 01/12/2019      Reactions   Codeine Nausea Only, Other (See Comments)   NAUSEA AND CAUSES HER TO HAVE BAD DREAMS   Gabapentin    Confusion   Lisinopril    Coughing      Medication List       Accurate as of January 12, 2019 10:50 AM. If you have any questions, ask your nurse or doctor.        aspirin 81 MG tablet Take 81 mg by mouth daily.   atorvastatin 20 MG tablet Commonly known as: LIPITOR Take 1 tablet (20 mg total) by mouth daily.   docusate sodium 100 MG capsule Commonly known as: COLACE Take 200 mg by mouth daily as needed.   famotidine 20 MG tablet Commonly known as: Pepcid Take 1 tablet (20 mg total) by mouth 2 (two) times daily. What changed:   when to take this  reasons to take this   glucose blood test strip Commonly known as: GNP Easy Touch Glucose Test Test sugars daily   insulin glargine 100 UNIT/ML injection  Commonly known as: Lantus Inject 0.22-0.3 mLs (22-30 Units total) into the skin at bedtime. INJECT 22 UNITS SQ AT BEDTIME   Insulin Syringe-Needle U-100 31G X 5/16" 1 ML Misc Commonly known as: TRUEplus Insulin Syringe AT BEDTIME Dx E11.8   levothyroxine 125 MCG tablet Commonly known as:  SYNTHROID Take 1 tablet (125 mcg total) by mouth daily.   losartan 25 MG tablet Commonly known as: COZAAR TAKE ONE (1) TABLET EACH DAY   metFORMIN 500 MG tablet Commonly known as: GLUCOPHAGE Take 1 tablet (500 mg total) by mouth 2 (two) times daily with a meal.   multivitamin with minerals Tabs tablet Take 1 tablet by mouth daily.   NovoLOG FlexPen 100 UNIT/ML FlexPen Generic drug: insulin aspart INJECT 10-17 UNITS SQ 3 TIMES DAILY WITHMEALS   Pen Needles 32G X 4 MM Misc 1 each by Does not apply route 4 (four) times daily.   SUMAtriptan 100 MG tablet Commonly known as: IMITREX TAKE 1 TABLET AT ONSET OF MIGRAINE HEADACHE MAY REPEAT ONCE IN 2 HOURS (MAX OF 2 TABLETS/24 HOURS)       History (reviewed): Past Medical History:  Diagnosis Date  . Anemia 07/23/2011  . Anxiety   . Diabetes mellitus   . Diabetic gastroparesis ([place]) 07/23/2011   Suspected  . Headache(784.0)   . Hearing difficulty   . Hyperlipidemia   . Hypothyroidism   . Kidney stones   . Microcytic anemia 07/23/2011  . Thalassemia minor    ????? not really worked up per pt but daughter has it???   Past Surgical History:  Procedure Laterality Date  . COLONOSCOPY  2004   Dr. Jim Desanctis: small fissue and hemorrhoids  . COLONOSCOPY  12/2011   Dr. Paulita Fujita: normal  . CYSTOSCOPY W/ URETERAL STENT PLACEMENT Right 08/13/2013   Procedure: CYSTOSCOPY WITH RETROGRADE PYELOGRAM/URETERAL STENT PLACEMENT;  Surgeon: Marissa Nestle, MD;  Location: [facility];  Service: Urology;  Laterality: Right;  . EXTRACORPOREAL SHOCK WAVE LITHOTRIPSY Right 08/19/2013   Procedure: EXTRACORPOREAL SHOCK WAVE LITHOTRIPSY (ESWL) RIGHT URETERAL CALCULUS;  Surgeon: Marissa Nestle, MD;  Location: [facility];  Service: Urology;  Laterality: Right;  . WRIST SURGERY Right    Family History  Problem Relation Age of Onset  . Colon cancer Maternal Grandmother        age 20  . Colon polyps Mother    Social History   Socioeconomic History  . Marital  status: Married    Spouse name: [name]  . Number of children: 1  . Years of education: Not on file  . Highest education level: Some college, no degree  Occupational History  . Occupation: Scientific laboratory technician: [employer]  . Occupation: Retired  Scientific laboratory technician  . Financial resource strain: Not hard at all  . Food insecurity    Worry: Never true    Inability: Never true  . Transportation needs    Medical: No    Non-medical: No  Tobacco Use  . Smoking status: Never Smoker  . Smokeless tobacco: Never Used  . Tobacco comment: Never smoked  Substance and Sexual Activity  . Alcohol use: No    Alcohol/week: 0.0 standard drinks  . Drug use: No  . Sexual activity: Not Currently  Lifestyle  . Physical activity    Days per week: 0 days    Minutes per session: 0 min  . Stress: Not at all  Relationships  . Social connections    Talks on phone: More than three  times a week    Gets together: More than three times a week    Attends religious service: More than 4 times per year    Active member of club or organization: Yes    Attends meetings of clubs or organizations: More than 4 times per year    Relationship status: Married  Other Topics Concern  . Not on file  Social History Narrative  . Not on file    Activities of Daily Living In your present state of health, do you have any difficulty performing the following activities: 01/12/2019  Hearing? Y  Comment Wears Hearing Aids  Vision? Y  Comment Has upcoming eye exam  Difficulty concentrating or making decisions? N  Walking or climbing stairs? N  Dressing or bathing? N  Doing errands, shopping? N  Preparing Food and eating ? N  Using the Toilet? N  In the past six months, have you accidently leaked urine? N  Do you have problems with loss of bowel control? N  Managing your Medications? N  Managing your Finances? N  Housekeeping or managing your Housekeeping? N  Some recent data might be hidden     Patient Literacy How often do you need to have someone help you when you read instructions, pamphlets, or other written materials from your doctor or pharmacy?: 1 - Never What is the last grade level you completed in school?: Some College  Exercise Current Exercise Habits: The patient does not participate in regular exercise at present  Diet Patient reports consuming 3 meals a day and occasional snack(s) a day Patient reports that her primary diet is: Regular Patient reports that she does have regular access to food.   Depression Screen PHQ 2/9 Scores 01/12/2019 09/01/2018 05/28/2018 10/28/2017 09/30/2017 06/19/2017 05/23/2016  PHQ - 2 Score 0 0 2 0 0 0 0  PHQ- 9 Score - 2 5 - - - -     Fall Risk Fall Risk  01/12/2019 09/01/2018 05/28/2018 10/28/2017 09/30/2017  Falls in the past year? 0 1 0 No No  Number falls in past yr: 0 0 - - -  Injury with Fall? 0 0 - - -  Follow up - - - - -     Objective:  Melissa Schneider seemed alert and oriented and she participated appropriately during our telephone visit.  Blood Pressure Weight BMI  BP Readings from Last 3 Encounters:  09/01/18 127/74  05/28/18 135/81  10/28/17 135/88   Wt Readings from Last 3 Encounters:  01/12/19 191 lb (86.6 kg)  09/01/18 191 lb (86.6 kg)  05/28/18 195 lb (88.5 kg)   BMI Readings from Last 1 Encounters:  01/12/19 34.93 kg/m    *Unable to obtain current vital signs, weight, and BMI due to telephone visit type  Hearing/Vision  . Kemora did not seem to have difficulty with hearing/understanding during the telephone conversation . Reports that she has not had a formal eye exam by an eye care professional within the past year . Reports that she has not had a formal hearing evaluation within the past year *Unable to fully assess hearing and vision during telephone visit type  Cognitive Function: 6CIT Screen 01/12/2019  What Year? 0 points  What month? 0 points  What time? 0 points  Count back from 20 0 points   Months in reverse 0 points  Repeat phrase 0 points  Total Score 0   (Normal:0-7, Significant for Dysfunction: >8)  Normal Cognitive Function Screening: Yes   Immunization &  Health Maintenance Record Immunization History  Administered Date(s) Administered  . Influenza Whole 05/16/2010  . Influenza,inj,Quad PF,6+ Mos 05/10/2015, 04/26/2016, 06/19/2017, 05/28/2018  . Pneumococcal Polysaccharide-23 07/16/2008, 01/30/2016  . Td 07/17/2007    Health Maintenance  Topic Date Due  . OPHTHALMOLOGY EXAM  10/23/1966  . HIV Screening  10/23/1971  . COLONOSCOPY  01/09/2017  . MAMMOGRAM  03/29/2017  . TETANUS/TDAP  07/16/2017  . FOOT EXAM  10/01/2018  . INFLUENZA VACCINE  02/14/2019  . HEMOGLOBIN A1C  03/02/2019  . PAP SMEAR-Modifier  04/28/2020  . PNEUMOCOCCAL POLYSACCHARIDE VACCINE AGE 36-64 HIGH RISK  Completed  . Hepatitis C Screening  Completed       Assessment  This is a routine wellness examination for Melissa Schneider.  Health Maintenance: Due or Overdue Health Maintenance Due  Topic Date Due  . OPHTHALMOLOGY EXAM  10/23/1966  . HIV Screening  10/23/1971  . COLONOSCOPY  01/09/2017  . MAMMOGRAM  03/29/2017  . TETANUS/TDAP  07/16/2017  . FOOT EXAM  10/01/2018    Melissa Schneider does not need a referral for Community Assistance: Care Management:   no Social Work:    no Prescription Assistance:  no Nutrition/Diabetes Education:  no   Plan:  Personalized Goals Goals Addressed            This Visit's Progress   . Exercise 3x per week (30 min per time)       Try to exercise for at least 30 minutes, 3 times weekly      Personalized Health Maintenance & Screening Recommendations  Td vaccine Screening mammography Colorectal cancer screening Diabetes screening Glaucoma screening Advanced directives: has NO advanced directive  - add't info requested. Referral to SW: no Shingrix  Lung Cancer Screening Recommended: no (Low Dose CT Chest recommended if Age  55-80 years, 30 pack-year currently smoking OR have quit w/in past 15 years) Hepatitis C Screening recommended: no HIV Screening recommended: yes  Advanced Directives: Written information was prepared per patient's request.  Referrals & Orders No orders of the defined types were placed in this encounter.   Follow-up Plan . Follow-up with Janora Norlander, DO as planned    I have personally reviewed and noted the following in the patient's chart:   . Medical and social history . Use of alcohol, tobacco or illicit drugs  . Current medications and supplements . Functional ability and status . Nutritional status . Physical activity . Advanced directives . List of other physicians . Hospitalizations, surgeries, and ER visits in previous 12 months . Vitals . Screenings to include cognitive, depression, and falls . Referrals and appointments  In addition, I have reviewed and discussed with Melissa Schneider certain preventive protocols, quality metrics, and best practice recommendations. A written personalized care plan for preventive services as well as general preventive health recommendations is available and can be mailed to the patient at her request.      Wardell Heath, LPN  12/20/3708

## 2019-01-12 NOTE — Patient Instructions (Signed)
Melissa Schneider , Thank you for taking time to come for your Medicare Wellness Visit. I appreciate your ongoing commitment to your health goals. Please review the following plan we discussed and let me know if I can assist you in the future.   These are the goals we discussed: Goals    . Exercise 3x per week (30 min per time)     Try to exercise for at least 30 minutes, 3 times weekly       This is a list of the screening recommended for you and due dates:  Health Maintenance  Topic Date Due  . Eye exam for diabetics  10/23/1966  . HIV Screening  10/23/1971  . Colon Cancer Screening  01/09/2017  . Mammogram  03/29/2017  . Tetanus Vaccine  07/16/2017  . Complete foot exam   10/01/2018  . Flu Shot  02/14/2019  . Hemoglobin A1C  03/02/2019  . Pap Smear  04/28/2020  . Pneumococcal vaccine  Completed  .  Hepatitis C: One time screening is recommended by Center for Disease Control  (CDC) for  adults born from [birth year] through 1964.   Completed        Advance Directive  Advance directives are legal documents that let you make choices ahead of time about your health care and medical treatment in case you become unable to communicate for yourself. Advance directives are a way for you to communicate your wishes to family, friends, and health care providers. This can help convey your decisions about end-of-life care if you become unable to communicate. Discussing and writing advance directives should happen over time rather than all at once. Advance directives can be changed depending on your situation and what you want, even after you have signed the advance directives. If you do not have an advance directive, some states assign family decision makers to act on your behalf based on how closely you are related to them. Each state has its own laws regarding advance directives. You may want to check with your health care provider, attorney, or state representative about the laws in your state. There  are different types of advance directives, such as:  Medical power of attorney.  Living will.  Do not resuscitate (DNR) or do not attempt resuscitation (DNAR) order. Health care proxy and medical power of attorney A health care proxy, also called a health care agent, is a person who is appointed to make medical decisions for you in cases in which you are unable to make the decisions yourself. Generally, people choose someone they know well and trust to represent their preferences. Make sure to ask this person for an agreement to act as your proxy. A proxy may have to exercise judgment in the event of a medical decision for which your wishes are not known. A medical power of attorney is a legal document that names your health care proxy. Depending on the laws in your state, after the document is written, it may also need to be:  Signed.  Notarized.  Dated.  Copied.  Witnessed.  Incorporated into your medical record. You may also want to appoint someone to manage your financial affairs in a situation in which you are unable to do so. This is called a durable power of attorney for finances. It is a separate legal document from the durable power of attorney for health care. You may choose the same person or someone different from your health care proxy to act as your agent in financial matters.  If you do not appoint a proxy, or if there is a concern that the proxy is not acting in your best interests, a court-appointed guardian may be designated to act on your behalf. Living will A living will is a set of instructions documenting your wishes about medical care when you cannot express them yourself. Health care providers should keep a copy of your living will in your medical record. You may want to give a copy to family members or friends. To alert caregivers in case of an emergency, you can place a card in your wallet to let them know that you have a living will and where they can find it. A  living will is used if you become:  Terminally ill.  Incapacitated.  Unable to communicate or make decisions. Items to consider in your living will include:  The use or non-use of life-sustaining equipment, such as dialysis machines and breathing machines (ventilators).  A DNR or DNAR order, which is the instruction not to use cardiopulmonary resuscitation (CPR) if breathing or heartbeat stops.  The use or non-use of tube feeding.  Withholding of food and fluids.  Comfort (palliative) care when the goal becomes comfort rather than a cure.  Organ and tissue donation. A living will does not give instructions for distributing your money and property if you should pass away. It is recommended that you seek the advice of a lawyer when writing a will. Decisions about taxes, beneficiaries, and asset distribution will be legally binding. This process can relieve your family and friends of any concerns surrounding disputes or questions that may come up about the distribution of your assets. DNR or DNAR A DNR or DNAR order is a request not to have CPR in the event that your heart stops beating or you stop breathing. If a DNR or DNAR order has not been made and shared, a health care provider will try to help any patient whose heart has stopped or who has stopped breathing. If you plan to have surgery, talk with your health care provider about how your DNR or DNAR order will be followed if problems occur. Summary  Advance directives are the legal documents that allow you to make choices ahead of time about your health care and medical treatment in case you become unable to communicate for yourself.  The process of discussing and writing advance directives should happen over time. You can change the advance directives, even after you have signed them.  Advance directives include DNR or DNAR orders, living wills, and designating an agent as your medical power of attorney. This information is not  intended to replace advice given to you by your health care provider. Make sure you discuss any questions you have with your health care provider. Document Released: 10/09/2007 Document Revised: 08/06/2018 Document Reviewed: 05/21/2016 Elsevier Patient Education  [place].    BMI for Adults  Body mass index (BMI) is a number that is calculated from a person's weight and height. BMI may help to estimate how much of a person's weight is composed of fat. BMI can help identify those who may be at higher risk for certain medical problems. How is BMI used with adults? BMI is used as a screening tool to identify possible weight problems. It is used to check whether a person is obese, overweight, healthy weight, or underweight. How is BMI calculated? BMI measures your weight and compares it to your height. This can be done either in [place] ([place] or metric measurements.  Note that charts are available to help you find your BMI quickly and easily without having to do these calculations yourself. To calculate your BMI in [place] ([place] measurements, your health care provider will: 1. Measure your weight in pounds (lb). 2. Multiply the number of pounds by 703. ? For example, for a person who weighs 180 lb, multiply that number by 703, which equals 126,540. 3. Measure your height in inches (in). Then multiply that number by itself to get a measurement called "inches squared." ? For example, for a person who is 70 in tall, the "inches squared" measurement is 70 in x 70 in, which equals 4900 inches squared. 4. Divide the total from Step 2 (number of lb x 703) by the total from Step 3 (inches squared): 126,540  4900 = 25.8. This is your BMI. To calculate your BMI in metric measurements, your health care provider will: 1. Measure your weight in kilograms (kg). 2. Measure your height in meters (m). Then multiply that number by itself to get a measurement called "meters squared." ? For example, for a  person who is 1.75 m tall, the "meters squared" measurement is 1.75 m x 1.75 m, which is equal to 3.1 meters squared. 3. Divide the number of kilograms (your weight) by the meters squared number. In this example: 70  3.1 = 22.6. This is your BMI. How is BMI interpreted? To interpret your results, your health care provider will use BMI charts to identify whether you are underweight, normal weight, overweight, or obese. The following guidelines will be used:  Underweight: BMI less than 18.5.  Normal weight: BMI between 18.5 and 24.9.  Overweight: BMI between 25 and 29.9.  Obese: BMI of 30 and above. Please note:  Weight includes both fat and muscle, so someone with a muscular build, such as an athlete, may have a BMI that is higher than 24.9. In cases like these, BMI is not an accurate measure of body fat.  To determine if excess body fat is the cause of a BMI of 25 or higher, further assessments may need to be done by a health care provider.  BMI is usually interpreted in the same way for men and women. Why is BMI a useful tool? BMI is useful in two ways:  Identifying a weight problem that may be related to a medical condition, or that may increase the risk for medical problems.  Promoting lifestyle and diet changes in order to reach a healthy weight. Summary  Body mass index (BMI) is a number that is calculated from a person's weight and height.  BMI may help to estimate how much of a person's weight is composed of fat. BMI can help identify those who may be at higher risk for certain medical problems.  BMI can be measured using English measurements or metric measurements.  To interpret your results, your health care provider will use BMI charts to identify whether you are underweight, normal weight, overweight, or obese. This information is not intended to replace advice given to you by your health care provider. Make sure you discuss any questions you have with your health care  provider. Document Released: 03/13/2004 Document Revised: 06/14/2017 Document Reviewed: 05/15/2017 Elsevier Patient Education  2020 [employer].

## 2019-01-27 ENCOUNTER — Other Ambulatory Visit: Payer: Self-pay | Admitting: *Deleted

## 2019-01-27 MED ORDER — SUMATRIPTAN SUCCINATE 100 MG PO TABS: ORAL_TABLET | ORAL | 2 refills | Status: DC

## 2019-01-27 NOTE — Progress Notes (Deleted)
Subjective: CC: DM2, HTN, HLD PCP: Melissa Norlander, DO KXF:GHWEXHB [name] is a 63 y.o. female presenting to clinic today for:  1. Type 2 Diabetes w/ HTN, HLD:  ***  She reports ongoing use of Lantus *** qhs and Novolog 10u +SSI. Takes metformin 500mg  BID, Losartan 25mg  daily and Lipitor 20 qhs.  High at home: *** Low at home: ***.  Avg FBG ***   ***  Last eye exam: Due Last foot exam: Due Last A1c:  Lab Results  Component Value Date   HGBA1C 8.9 (H) 09/01/2018   Nephropathy screen indicated?:  No.  Wants to resume ARB. Last flu, zoster and/or pneumovax:  Immunization History  Administered Date(s) Administered  . Influenza Whole 05/16/2010  . Influenza,inj,Quad PF,6+ Mos 05/10/2015, 04/26/2016, 06/19/2017, 05/28/2018  . Pneumococcal Polysaccharide-23 07/16/2008, 01/30/2016  . Td 07/17/2007    ROS: [place] LOC, polyuria, polydipsia, unintended weight loss/gain, foot ulcerations, numbness or tingling in extremities or chest pain.  2. Hypothyroidism Longstanding history of hypothyroidism that was incidentally found on routine lab work.  No history of surgery or radiation to the neck.  At last visit she was found to have a suppressed TSH.  *** denies any constipation, diarrhea, tremor, insomnia, heart palpitations, difficulty swallowing or change in voice.  No visual disturbance.    ROS: Per HPI  Allergies  Allergen Reactions  . Codeine Nausea Only and Other (See Comments)    NAUSEA AND CAUSES HER TO HAVE BAD DREAMS  . Gabapentin     Confusion   . Lisinopril     Coughing   Past Medical History:  Diagnosis Date  . Anemia 07/23/2011  . Anxiety   . Diabetes mellitus   . Diabetic gastroparesis ([place]) 07/23/2011   Suspected  . Headache(784.0)   . Hearing difficulty   . Hyperlipidemia   . Hypothyroidism   . Kidney stones   . Microcytic anemia 07/23/2011  . Thalassemia minor    ????? not really worked up per pt but daughter has it???    Current Outpatient  Medications:  .  aspirin 81 MG tablet, Take 81 mg by mouth daily., Disp: , Rfl:  .  atorvastatin (LIPITOR) 20 MG tablet, Take 1 tablet (20 mg total) by mouth daily., Disp: 90 tablet, Rfl: 3 .  docusate sodium (COLACE) 100 MG capsule, Take 200 mg by mouth daily as needed. , Disp: , Rfl:  .  famotidine (PEPCID) 20 MG tablet, Take 1 tablet (20 mg total) by mouth 2 (two) times daily. (Patient taking differently: Take 20 mg by mouth 2 (two) times daily as needed. ), Disp: 30 tablet, Rfl: 2 .  glucose blood (GNP EASY TOUCH GLUCOSE TEST) test strip, Test sugars daily, Disp: 100 each, Rfl: 3 .  insulin glargine (LANTUS) 100 UNIT/ML injection, Inject 0.22-0.3 mLs (22-30 Units total) into the skin at bedtime. INJECT 22 UNITS SQ AT BEDTIME, Disp: 10 mL, Rfl: 6 .  Insulin Pen Needle (PEN NEEDLES) 32G X 4 MM MISC, 1 each by Does not apply route 4 (four) times daily., Disp: 100 each, Rfl: 12 .  Insulin Syringe-Needle U-100 (TRUEPLUS INSULIN SYRINGE) 31G X 5/16" 1 ML MISC, AT BEDTIME Dx E11.8, Disp: 90 each, Rfl: 3 .  levothyroxine (SYNTHROID, LEVOTHROID) 125 MCG tablet, Take 1 tablet (125 mcg total) by mouth daily., Disp: 90 tablet, Rfl: 1 .  losartan (COZAAR) 25 MG tablet, TAKE ONE (1) TABLET EACH DAY, Disp: 90 tablet, Rfl: 1 .  metFORMIN (GLUCOPHAGE) 500 MG tablet, Take  1 tablet (500 mg total) by mouth 2 (two) times daily with a meal., Disp: 180 tablet, Rfl: 1 .  Multiple Vitamin (MULTIVITAMIN WITH MINERALS) TABS tablet, Take 1 tablet by mouth daily., Disp: , Rfl:  .  NOVOLOG FLEXPEN 100 UNIT/ML FlexPen, INJECT 10-17 UNITS SQ 3 TIMES DAILY WITHMEALS, Disp: 15 mL, Rfl: 0 .  SUMAtriptan (IMITREX) 100 MG tablet, TAKE 1 TABLET AT ONSET OF MIGRAINE HEADACHE MAY REPEAT ONCE IN 2 HOURS (MAX OF 2 TABLETS/24 HOURS), Disp: 9 tablet, Rfl: 2 Social History   Socioeconomic History  . Marital status: Married    Spouse name: Melissa Schneider  . Number of children: 1  . Years of education: Not on file  . Highest education level:  Some college, no degree  Occupational History  . 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 Occupation: Retired  Scientific laboratory technician  . Financial resource strain: Not hard at all  . Food insecurity    Worry: Never true    Inability: Never true  . Transportation needs    Medical: No    Non-medical: No  Tobacco Use  . Smoking status: Never Smoker  . Smokeless tobacco: Never Used  . Tobacco comment: Never smoked  Substance and Sexual Activity  . Alcohol use: No    Alcohol/week: 0.0 standard drinks  . Drug use: No  . Sexual activity: Not Currently  Lifestyle  . Physical activity    Days per week: 0 days    Minutes per session: 0 min  . Stress: Not at all  Relationships  . Social connections    Talks on phone: More than three times a week    Gets together: More than three times a week    Attends religious service: More than 4 times per year    Active member of club or organization: Yes    Attends meetings of clubs or organizations: More than 4 times per year    Relationship status: Married  . Intimate partner violence    Fear of current or ex partner: No    Emotionally abused: No    Physically abused: No    Forced sexual activity: No  Other Topics Concern  . Not on file  Social History Narrative  . Not on file   Family History  Problem Relation Age of Onset  . Colon cancer Maternal Grandmother        age 7  . Colon polyps Mother     Objective: Office vital signs reviewed. There were no vitals taken for this visit.  Physical Examination:  General: Awake, alert, well nourished, No acute distress HEENT: Normal.  Sclera white.  No exophthalmos, no goiter. Cardio: regular rate and rhythm, S1S2 heard, no murmurs appreciated Pulm: clear to auscultation bilaterally, no wheezes, rhonchi or rales; normal work of breathing on room air Extremities: warm, well perfused, No edema, cyanosis or clubbing; +2 pulses bilaterally Skin: Normal temperature  Neuro: No tremor.  Follows all commands.  No focal neurologic deficits. See DM foot below  Diabetic Foot Exam - Simple   No data filed      Assessment/ Plan: 63 y.o. female   ***  No orders of the defined types were placed in this encounter.  No orders of the defined types were placed in this encounter.    Melissa Norlander, DO [place] [phone]

## 2019-02-02 ENCOUNTER — Ambulatory Visit: Payer: Self-pay | Admitting: Family Medicine

## 2019-02-27 ENCOUNTER — Ambulatory Visit: Payer: Self-pay | Admitting: Family Medicine

## 2019-03-16 ENCOUNTER — Ambulatory Visit: Payer: Self-pay | Admitting: Family Medicine

## 2019-03-30 ENCOUNTER — Other Ambulatory Visit: Payer: Self-pay | Admitting: Family Medicine

## 2019-03-30 ENCOUNTER — Telehealth: Payer: Self-pay | Admitting: Family Medicine

## 2019-03-30 DIAGNOSIS — E1165 Type 2 diabetes mellitus with hyperglycemia: Secondary | ICD-10-CM

## 2019-03-30 DIAGNOSIS — E039 Hypothyroidism, unspecified: Secondary | ICD-10-CM

## 2019-03-30 NOTE — Telephone Encounter (Signed)
Left detailed message stating ok to come in and have labs drawn. Per dpr

## 2019-03-30 NOTE — Telephone Encounter (Signed)
Absolutely.  Labs placed.

## 2019-03-31 ENCOUNTER — Ambulatory Visit: Payer: Self-pay | Admitting: Family Medicine

## 2019-04-03 ENCOUNTER — Other Ambulatory Visit: Payer: PPO

## 2019-04-03 ENCOUNTER — Other Ambulatory Visit: Payer: Self-pay

## 2019-04-03 DIAGNOSIS — E039 Hypothyroidism, unspecified: Secondary | ICD-10-CM

## 2019-04-03 DIAGNOSIS — E1165 Type 2 diabetes mellitus with hyperglycemia: Secondary | ICD-10-CM | POA: Diagnosis not present

## 2019-04-03 LAB — BAYER DCA HB A1C WAIVED: HB A1C (BAYER DCA - WAIVED): 9.9 % — ABNORMAL HIGH (ref <7.0)

## 2019-04-04 LAB — CMP14+EGFR
ALT: 13 IU/L (ref 0–32)
AST: 15 IU/L (ref 0–40)
Albumin/Globulin Ratio: 1.9 (ref 1.2–2.2)
Albumin: 4.4 g/dL (ref 3.8–4.8)
Alkaline Phosphatase: 116 IU/L (ref 39–117)
BUN/Creatinine Ratio: 15 (ref 12–28)
BUN: 13 mg/dL (ref 8–27)
Bilirubin Total: 0.2 mg/dL (ref 0.0–1.2)
CO2: 23 mmol/L (ref 20–29)
Calcium: 9.5 mg/dL (ref 8.7–10.3)
Chloride: 103 mmol/L (ref 96–106)
Creatinine, Ser: 0.87 mg/dL (ref 0.57–1.00)
GFR calc Af Amer: 83 mL/min/{1.73_m2} (ref >59)
GFR calc non Af Amer: 72 mL/min/{1.73_m2} (ref >59)
Globulin, Total: 2.3 g/dL (ref 1.5–4.5)
Glucose: 134 mg/dL — ABNORMAL HIGH (ref 65–99)
Potassium: 4.4 mmol/L (ref 3.5–5.2)
Sodium: 140 mmol/L (ref 134–144)
Total Protein: 6.7 g/dL (ref 6.0–8.5)

## 2019-04-04 LAB — THYROID PANEL WITH TSH
Free Thyroxine Index: 2.9 (ref 1.2–4.9)
T3 Uptake Ratio: 29 % (ref 24–39)
T4, Total: 10.1 ug/dL (ref 4.5–12.0)
TSH: 3.08 u[IU]/mL (ref 0.450–4.500)

## 2019-04-07 ENCOUNTER — Ambulatory Visit (INDEPENDENT_AMBULATORY_CARE_PROVIDER_SITE_OTHER): Payer: PPO | Admitting: Family Medicine

## 2019-04-07 DIAGNOSIS — Z794 Long term (current) use of insulin: Secondary | ICD-10-CM

## 2019-04-07 DIAGNOSIS — E785 Hyperlipidemia, unspecified: Secondary | ICD-10-CM

## 2019-04-07 DIAGNOSIS — E039 Hypothyroidism, unspecified: Secondary | ICD-10-CM

## 2019-04-07 DIAGNOSIS — E1159 Type 2 diabetes mellitus with other circulatory complications: Secondary | ICD-10-CM | POA: Diagnosis not present

## 2019-04-07 DIAGNOSIS — E1169 Type 2 diabetes mellitus with other specified complication: Secondary | ICD-10-CM | POA: Diagnosis not present

## 2019-04-07 DIAGNOSIS — IMO0002 Reserved for concepts with insufficient information to code with codable children: Secondary | ICD-10-CM

## 2019-04-07 DIAGNOSIS — E1165 Type 2 diabetes mellitus with hyperglycemia: Secondary | ICD-10-CM

## 2019-04-07 DIAGNOSIS — I10 Essential (primary) hypertension: Secondary | ICD-10-CM

## 2019-04-07 DIAGNOSIS — E118 Type 2 diabetes mellitus with unspecified complications: Secondary | ICD-10-CM

## 2019-04-07 MED ORDER — LEVOTHYROXINE SODIUM 125 MCG PO TABS: 125.0000 ug | ORAL_TABLET | Freq: Every day | ORAL | 1 refills | Status: DC

## 2019-04-07 MED ORDER — LOSARTAN POTASSIUM 25 MG PO TABS: ORAL_TABLET | ORAL | 1 refills | Status: DC

## 2019-04-07 NOTE — Patient Instructions (Addendum)
We could consider Trulicity or Ozempic as addition if your sugar is not improving. Get your diabetic eye exam and have it sent to the office.  Mediterranean Diet A Mediterranean diet refers to food and lifestyle choices that are based on the traditions of countries located on the [employer]. This way of eating has been shown to help prevent certain conditions and improve outcomes for people who have chronic diseases, like kidney disease and heart disease. What are tips for following this plan? Lifestyle  Cook and eat meals together with your family, when possible.  Drink enough fluid to keep your urine clear or pale yellow.  Be physically active every day. This includes: ? Aerobic exercise like running or swimming. ? Leisure activities like gardening, walking, or housework.  Get 7-8 hours of sleep each night.  If recommended by your health care provider, drink red wine in moderation. This means 1 glass a day for nonpregnant women and 2 glasses a day for men. A glass of wine equals 5 oz (150 mL). Reading food labels   Check the serving size of packaged foods. For foods such as rice and pasta, the serving size refers to the amount of cooked product, not dry.  Check the total fat in packaged foods. Avoid foods that have saturated fat or trans fats.  Check the ingredients list for added sugars, such as corn syrup. Shopping  At the grocery store, buy most of your food from the areas near the walls of the store. This includes: ? Fresh fruits and vegetables (produce). ? Grains, beans, nuts, and seeds. Some of these may be available in unpackaged forms or large amounts (in bulk). ? Fresh seafood. ? Poultry and eggs. ? Low-fat dairy products.  Buy whole ingredients instead of prepackaged foods.  Buy fresh fruits and vegetables in-season from local farmers markets.  Buy frozen fruits and vegetables in resealable bags.  If you do not have access to quality fresh seafood, buy  precooked frozen shrimp or canned fish, such as tuna, salmon, or sardines.  Buy small amounts of raw or cooked vegetables, salads, or olives from the deli or salad bar at your store.  Stock your pantry so you always have certain foods on hand, such as olive oil, canned tuna, canned tomatoes, rice, pasta, and beans. Cooking  Cook foods with extra-virgin olive oil instead of using butter or other vegetable oils.  Have meat as a side dish, and have vegetables or grains as your main dish. This means having meat in small portions or adding small amounts of meat to foods like pasta or stew.  Use beans or vegetables instead of meat in common dishes like chili or lasagna.  Experiment with different cooking methods. Try roasting or broiling vegetables instead of steaming or sauteing them.  Add frozen vegetables to soups, stews, pasta, or rice.  Add nuts or seeds for added healthy fat at each meal. You can add these to yogurt, salads, or vegetable dishes.  Marinate fish or vegetables using olive oil, lemon juice, garlic, and fresh herbs. Meal planning   Plan to eat 1 vegetarian meal one day each week. Try to work up to 2 vegetarian meals, if possible.  Eat seafood 2 or more times a week.  Have healthy snacks readily available, such as: ? Vegetable sticks with hummus. ? [place] yogurt. ? Fruit and nut trail mix.  Eat balanced meals throughout the week. This includes: ? Fruit: 2-3 servings a day ? Vegetables: 4-5 servings a day ?  Low-fat dairy: 2 servings a day ? Fish, poultry, or lean meat: 1 serving a day ? Beans and legumes: 2 or more servings a week ? Nuts and seeds: 1-2 servings a day ? Whole grains: 6-8 servings a day ? Extra-virgin olive oil: 3-4 servings a day  Limit red meat and sweets to only a few servings a month What are my food choices?  Mediterranean diet ? Recommended  Grains: Whole-grain pasta. Brown rice. Bulgar wheat. Polenta. Couscous. Whole-wheat bread.  [place].  Vegetables: Artichokes. Beets. Broccoli. Cabbage. Carrots. Eggplant. Green beans. Chard. Kale. Spinach. Onions. Leeks. Peas. Squash. Tomatoes. Peppers. Radishes.  Fruits: Apples. Apricots. Avocado. Berries. Bananas. Cherries. Dates. Figs. Grapes. Lemons. Melon. Oranges. Peaches. Plums. Pomegranate.  Meats and other protein foods: Beans. Almonds. Sunflower seeds. Pine nuts. Peanuts. [place]. Salmon. Scallops. Shrimp. [place]. Tilapia. Clams. Oysters. Eggs.  Dairy: Low-fat milk. Cheese. Greek yogurt.  Beverages: Water. Red wine. Herbal tea.  Fats and oils: Extra virgin olive oil. Avocado oil. Grape seed oil.  Sweets and desserts: [place] yogurt with honey. Baked apples. Poached pears. Trail mix.  Seasoning and other foods: Basil. Cilantro. Coriander. Cumin. Mint. Parsley. Sage. Rosemary. Tarragon. Garlic. Oregano. Thyme. Pepper. Balsalmic vinegar. Tahini. Hummus. Tomato sauce. Olives. Mushrooms. ? Limit these  Grains: Prepackaged pasta or rice dishes. Prepackaged cereal with added sugar.  Vegetables: Deep fried potatoes (french fries).  Fruits: Fruit canned in syrup.  Meats and other protein foods: Beef. Pork. Lamb. Poultry with skin. Hot dogs. Berniece Salines.  Dairy: Ice cream. Sour cream. Whole milk.  Beverages: Juice. Sugar-sweetened soft drinks. Beer. Liquor and spirits.  Fats and oils: Butter. Canola oil. Vegetable oil. Beef fat (tallow). Lard.  Sweets and desserts: Cookies. Cakes. Pies. Candy.  Seasoning and other foods: Mayonnaise. Premade sauces and marinades. The items listed may not be a complete list. Talk with your dietitian about what dietary choices are right for you. Summary  The Mediterranean diet includes both food and lifestyle choices.  Eat a variety of fresh fruits and vegetables, beans, nuts, seeds, and whole grains.  Limit the amount of red meat and sweets that you eat.  Talk with your health care provider about whether it is safe for you to drink red  wine in moderation. This means 1 glass a day for nonpregnant women and 2 glasses a day for men. A glass of wine equals 5 oz (150 mL). This information is not intended to replace advice given to you by your health care provider. Make sure you discuss any questions you have with your health care provider. Document Released: 02/23/2016 Document Revised: 03/01/2016 Document Reviewed: 02/23/2016 Elsevier Patient Education  2020 [employer].

## 2019-04-07 NOTE — Progress Notes (Signed)
Telephone visit  Subjective: CC: DM2, Hypothyroidism PCP: Janora Norlander, DO [id number] Melissa Schneider is a 64 y.o. female calls for telephone consult today. Patient provides verbal consent for consult held via phone.  Location of patient: home Location of provider: [facility] Others present for call: none  1. Type 2 Diabetes w/ HLD:  Patient reports: Taking medication(s): Lantus 22u.  She is not always remembering the NovoLog and cites that she has been eating out quite a bit more lately.  She also does not remember the afternoon dose of the metformin and is usually only taking her morning dose.  She is never been on anything like Trulicity or Ozempic but states that her husband had severe abdominal pain with 1 of these types of medicines in the past and she is somewhat reluctant to start that.  Last eye exam: needs, sees [facility] Last foot exam: needs Last A1c:  Lab Results  Component Value Date   HGBA1C 9.9 (H) 04/03/2019   Nephropathy screen indicated?:  Needs flu shot Last flu, zoster and/or pneumovax:  Immunization History  Administered Date(s) Administered  . Influenza Whole 05/16/2010  . Influenza,inj,Quad PF,6+ Mos 05/10/2015, 04/26/2016, 06/19/2017, 05/28/2018  . Pneumococcal Polysaccharide-23 07/16/2008, 01/30/2016  . Td 07/17/2007    ROS: She does not report any chest pain, shortness of breath, foot ulcerations or visual disturbance.  Her glasses are very out of date though and she identifies needing to get these replaced.  2.  Hypothyroidism Patient reports compliance with her Synthroid.  She does not report any heart palpitations, tremors or change in voice.    ROS: Per HPI  Allergies  Allergen Reactions  . Codeine Nausea Only and Other (See Comments)    NAUSEA AND CAUSES HER TO HAVE BAD DREAMS  . Gabapentin     Confusion   . Lisinopril     Coughing   Past Medical History:  Diagnosis Date  . Anemia 07/23/2011  . Anxiety   . Diabetes mellitus   .  Diabetic gastroparesis ([place]) 07/23/2011   Suspected  . Headache(784.0)   . Hearing difficulty   . Hyperlipidemia   . Hypothyroidism   . Kidney stones   . Microcytic anemia 07/23/2011  . Thalassemia minor    ????? not really worked up per pt but daughter has it???    Current Outpatient Medications:  .  aspirin 81 MG tablet, Take 81 mg by mouth daily., Disp: , Rfl:  .  atorvastatin (LIPITOR) 20 MG tablet, Take 1 tablet (20 mg total) by mouth daily., Disp: 90 tablet, Rfl: 3 .  docusate sodium (COLACE) 100 MG capsule, Take 200 mg by mouth daily as needed. , Disp: , Rfl:  .  famotidine (PEPCID) 20 MG tablet, Take 1 tablet (20 mg total) by mouth 2 (two) times daily. (Patient taking differently: Take 20 mg by mouth 2 (two) times daily as needed. ), Disp: 30 tablet, Rfl: 2 .  glucose blood (GNP EASY TOUCH GLUCOSE TEST) test strip, Test sugars daily, Disp: 100 each, Rfl: 3 .  insulin glargine (LANTUS) 100 UNIT/ML injection, Inject 0.22-0.3 mLs (22-30 Units total) into the skin at bedtime. INJECT 22 UNITS SQ AT BEDTIME, Disp: 10 mL, Rfl: 6 .  Insulin Pen Needle (PEN NEEDLES) 32G X 4 MM MISC, 1 each by Does not apply route 4 (four) times daily., Disp: 100 each, Rfl: 12 .  Insulin Syringe-Needle U-100 (TRUEPLUS INSULIN SYRINGE) 31G X 5/16" 1 ML MISC, AT BEDTIME Dx E11.8, Disp: 90 each, Rfl:  3 .  levothyroxine (SYNTHROID, LEVOTHROID) 125 MCG tablet, Take 1 tablet (125 mcg total) by mouth daily., Disp: 90 tablet, Rfl: 1 .  losartan (COZAAR) 25 MG tablet, TAKE ONE (1) TABLET EACH DAY, Disp: 90 tablet, Rfl: 1 .  metFORMIN (GLUCOPHAGE) 500 MG tablet, Take 1 tablet (500 mg total) by mouth 2 (two) times daily with a meal., Disp: 180 tablet, Rfl: 1 .  Multiple Vitamin (MULTIVITAMIN WITH MINERALS) TABS tablet, Take 1 tablet by mouth daily., Disp: , Rfl:  .  NOVOLOG FLEXPEN 100 UNIT/ML FlexPen, INJECT 10-17 UNITS SQ 3 TIMES DAILY WITHMEALS, Disp: 15 mL, Rfl: 0 .  SUMAtriptan (IMITREX) 100 MG tablet, TAKE 1 TABLET AT  ONSET OF MIGRAINE HEADACHE MAY REPEAT ONCE IN 2 HOURS (MAX OF 2 TABLETS/24 HOURS), Disp: 9 tablet, Rfl: 2  Assessment/ Plan: 63 y.o. female   1. Uncontrolled type 2 diabetes mellitus with complication, with long-term current use of insulin (HCC) A1c is come up even further to 9.9.  This seems to be very reflective of her diet.  I offered referral to nutrition today but she would like to proceed with the Mediterranean diet.  Information has been presented to her and her my chart.  We discussed consideration for addition of Trulicity versus Ozempic.  She would like to read more about these before proceeding.  I have instructed her to increase her Lantus by 1 unit every 2 days for fasting blood sugars greater than 150.  She needs to keep a close eye on her postprandial sugars and give her sliding scale insulin as instructed.  She will also change her metformin to 1000 mg every morning since she is often forgetting the afternoon doses.  We have scheduled a face-to-face follow-up in December and she is aware of date and time.  Plan for fasting lipid panel and repeat A1c at that visit.  If she remains uncontrolled, will need to consider referral to endocrinology for assistance.  2. Hypertension associated with diabetes ([place]) Stable.   - losartan (COZAAR) 25 MG tablet; TAKE ONE (1) TABLET EACH DAY  Dispense: 90 tablet; Refill: 1  3. Hyperlipidemia associated with type 2 diabetes mellitus ([place]) Continue statin  4. Acquired hypothyroidism Controlled. - levothyroxine (SYNTHROID) 125 MCG tablet; Take 1 tablet (125 mcg total) by mouth daily.  Dispense: 90 tablet; Refill: 1   Start time: 10:10am End time: 10:22am  Total time spent on patient care (including telephone call/ virtual visit): 18 minutes  [place], [place] [phone]

## 2019-06-04 ENCOUNTER — Other Ambulatory Visit: Payer: Self-pay | Admitting: Family Medicine

## 2019-06-04 DIAGNOSIS — IMO0002 Reserved for concepts with insufficient information to code with codable children: Secondary | ICD-10-CM

## 2019-06-04 DIAGNOSIS — E1165 Type 2 diabetes mellitus with hyperglycemia: Secondary | ICD-10-CM

## 2019-06-29 ENCOUNTER — Ambulatory Visit: Payer: Self-pay | Admitting: Family Medicine

## 2019-06-30 ENCOUNTER — Telehealth (INDEPENDENT_AMBULATORY_CARE_PROVIDER_SITE_OTHER): Payer: PPO | Admitting: Student

## 2019-06-30 ENCOUNTER — Encounter: Payer: Self-pay | Admitting: Student

## 2019-06-30 VITALS — BP 134/80 | HR 72 | Ht 62.0 in | Wt 190.0 lb

## 2019-06-30 DIAGNOSIS — R072 Precordial pain: Secondary | ICD-10-CM

## 2019-06-30 DIAGNOSIS — Z794 Long term (current) use of insulin: Secondary | ICD-10-CM

## 2019-06-30 DIAGNOSIS — E119 Type 2 diabetes mellitus without complications: Secondary | ICD-10-CM | POA: Diagnosis not present

## 2019-06-30 DIAGNOSIS — R079 Chest pain, unspecified: Secondary | ICD-10-CM | POA: Diagnosis not present

## 2019-06-30 DIAGNOSIS — E785 Hyperlipidemia, unspecified: Secondary | ICD-10-CM

## 2019-06-30 DIAGNOSIS — Z7189 Other specified counseling: Secondary | ICD-10-CM | POA: Diagnosis not present

## 2019-06-30 DIAGNOSIS — E039 Hypothyroidism, unspecified: Secondary | ICD-10-CM

## 2019-06-30 MED ORDER — METOPROLOL TARTRATE 100 MG PO TABS: ORAL_TABLET | ORAL | 0 refills | Status: DC

## 2019-06-30 NOTE — Progress Notes (Signed)
Virtual Visit via Telephone Note   This visit type was conducted due to national recommendations for restrictions regarding the COVID-19 Pandemic (e.g. social distancing) in an effort to limit this patient's exposure and mitigate transmission in our community.  Due to her co-morbid illnesses, this patient is at least at moderate risk for complications without adequate follow up.  This format is felt to be most appropriate for this patient at this time.  The patient did not have access to video technology/had technical difficulties with video requiring transitioning to audio format only (telephone).  All issues noted in this document were discussed and addressed.  No physical exam could be performed with this format.  Please refer to the patient's chart for her consent to telehealth for [facility].   Date:  06/30/2019   ID:  Melissa Schneider, DOB 22-Sep-1955, MRN [id number]  Patient Location: Home Provider Location: Office  PCP:  Janora Norlander, DO  Cardiologist:  Carlyle Dolly, MD  Electrophysiologist:  None   Evaluation Performed:  Follow-Up Visit  Chief Complaint: Chest Pain  History of Present Illness:    Melissa Schneider is a 63 y.o. female with past medical history of chest pain (occurring for 10+ years by review of notes, low-risk ETT in 2017 and low-risk NST in 01/2017), Hypothyroidism, HLD and IDDM who presents for a follow-up telehealth visit in regards to recent chest pain.  She was last examined by Dr. Harl Bowie in 08/2017 and denied any recent chest pain at that time. Did report intermittent episodes of left arm discomfort which intensified with positional changes. Symptoms had previously improved with antiacids and given her recent stress test, no further ischemic evaluation was pursued at that time.   In talking with the patient today, she reports having episodes of left-sided chest discomfort which radiates into her neck for the past 2 to 3 weeks. Symptoms  typically occur in the evening hours and can last for several minutes to hours at a time. Her pain is not exacerbated with exertion or positional changes. She has been walking for exercise and reports walking up to a mile without any pain. She does walk up an incline at times and reports having palpitations with this but denies any dyspnea or pain. No recent orthopnea, PND or lower extremity edema.  The patient does not have symptoms concerning for COVID-19 infection (fever, chills, cough, or new shortness of breath).    Past Medical History:  Diagnosis Date  . Anemia 07/23/2011  . Anxiety   . Diabetes mellitus   . Diabetic gastroparesis ([place]) 07/23/2011   Suspected  . Headache(784.0)   . Hearing difficulty   . Hyperlipidemia   . Hypothyroidism   . Kidney stones   . Microcytic anemia 07/23/2011  . Thalassemia minor    ????? not really worked up per pt but daughter has it???   Past Surgical History:  Procedure Laterality Date  . COLONOSCOPY  2004   Dr. Jim Desanctis: small fissue and hemorrhoids  . COLONOSCOPY  12/2011   Dr. Paulita Fujita: normal  . CYSTOSCOPY W/ URETERAL STENT PLACEMENT Right 08/13/2013   Procedure: CYSTOSCOPY WITH RETROGRADE PYELOGRAM/URETERAL STENT PLACEMENT;  Surgeon: Marissa Nestle, MD;  Location: [facility];  Service: Urology;  Laterality: Right;  . EXTRACORPOREAL SHOCK WAVE LITHOTRIPSY Right 08/19/2013   Procedure: EXTRACORPOREAL SHOCK WAVE LITHOTRIPSY (ESWL) RIGHT URETERAL CALCULUS;  Surgeon: Marissa Nestle, MD;  Location: [facility];  Service: Urology;  Laterality: Right;  . WRIST SURGERY Right  Current Meds  Medication Sig  . aspirin 81 MG tablet Take 81 mg by mouth daily.  Marland Kitchen atorvastatin (LIPITOR) 20 MG tablet Take 1 tablet (20 mg total) by mouth daily.  Marland Kitchen docusate sodium (COLACE) 100 MG capsule Take 200 mg by mouth daily as needed.   . famotidine (PEPCID) 20 MG tablet Take 1 tablet (20 mg total) by mouth 2 (two) times daily. (Patient taking differently: Take 20 mg by  mouth 2 (two) times daily as needed. )  . glucose blood (GNP EASY TOUCH GLUCOSE TEST) test strip Test glucose daily Dx E11.69  . insulin glargine (LANTUS) 100 UNIT/ML injection Inject 0.22-0.3 mLs (22-30 Units total) into the skin at bedtime. INJECT 22 UNITS SQ AT BEDTIME  . Insulin Pen Needle (PEN NEEDLES) 32G X 4 MM MISC 1 each by Does not apply route 4 (four) times daily.  . Insulin Syringe-Needle U-100 (TRUEPLUS INSULIN SYRINGE) 31G X 5/16" 1 ML MISC AT BEDTIME Dx E11.8  . levothyroxine (SYNTHROID) 125 MCG tablet Take 1 tablet (125 mcg total) by mouth daily.  Marland Kitchen losartan (COZAAR) 25 MG tablet TAKE ONE (1) TABLET EACH DAY  . metFORMIN (GLUCOPHAGE) 500 MG tablet Take 1 tablet (500 mg total) by mouth 2 (two) times daily with a meal.  . Multiple Vitamin (MULTIVITAMIN WITH MINERALS) TABS tablet Take 1 tablet by mouth daily.  Marland Kitchen NOVOLOG FLEXPEN 100 UNIT/ML FlexPen INJECT 10-17 UNITS SQ 3 TIMES DAILY WITHMEALS  . SUMAtriptan (IMITREX) 100 MG tablet TAKE 1 TABLET AT ONSET OF MIGRAINE HEADACHE MAY REPEAT ONCE IN 2 HOURS (MAX OF 2 TABLETS/24 HOURS)     Allergies:   Codeine, Gabapentin, and Lisinopril   Social History   Tobacco Use  . Smoking status: Never Smoker  . Smokeless tobacco: Never Used  . Tobacco comment: Never smoked  Substance Use Topics  . Alcohol use: No    Alcohol/week: 0.0 standard drinks  . Drug use: No     Family Hx: The patient's family history includes Colon cancer in her maternal grandmother; Colon polyps in her mother.  ROS:   Please see the history of present illness.     All other systems reviewed and are negative.   Prior CV studies:   The following studies were reviewed today:  Echocardiogram: 08/2015 Study Conclusions  - Left ventricle: The cavity size was normal. Wall thickness was at   the upper limits of normal. Systolic function was normal. The   estimated ejection fraction was in the range of 60% to 65%. Wall   motion was normal; there were no  regional wall motion   abnormalities. Left ventricular diastolic function parameters   were normal. - Ascending aorta: The ascending aorta was mildly ectatic. - Mitral valve: There was trivial regurgitation. - Right atrium: Central venous pressure (est): 3 mm Hg. - Tricuspid valve: There was trivial regurgitation. - Pulmonary arteries: PA peak pressure: 23 mm Hg (S). - Pericardium, extracardiac: There was no pericardial effusion.  Impressions:  - Upper normal LV wall thickness with LVEF 60-65% and grossly   normal diastolic function. Mildly ectatic ascending aorta.   Trivial mitral and tricuspid regurgitation. Estimated PASP 23   mmHg.  GXT: 01/2016  There was no ST segment deviation noted during stress.  No T wave inversion was noted during stress.  Motion artifact No definite ST changes to suggest ischemia  NST: 01/2017  No diagnostic ST segment changes to indicate ischemia. Low risk Duke treadmill score 4.5.  Blood pressure demonstrated a hypertensive  response to exercise.  Small, mild intensity, fixed anteroapical defect consistent with breast attenuation.  This is a low risk study.  Nuclear stress EF: 76%.    Labs/Other Tests and Data Reviewed:    EKG:  No ECG reviewed.  Recent Labs: 04/03/2019: ALT 13; BUN 13; Creatinine, Ser 0.87; Potassium 4.4; Sodium 140; TSH 3.080   Recent Lipid Panel Lab Results  Component Value Date/Time   CHOL 260 (H) 05/28/2018 10:01 AM   TRIG 97 05/28/2018 10:01 AM   HDL 87 05/28/2018 10:01 AM   CHOLHDL 3.0 05/28/2018 10:01 AM   LDLCALC 154 (H) 05/28/2018 10:01 AM    Wt Readings from Last 3 Encounters:  06/30/19 190 lb (86.2 kg)  01/12/19 191 lb (86.6 kg)  09/01/18 191 lb (86.6 kg)     Objective:    Vital Signs:  BP 134/80   Pulse 72   Ht 5\' 2"  (1.575 m)   Wt 190 lb (86.2 kg)   BMI 34.75 kg/m    General: Pleasant female sounding in NAD Psych: Normal affect. Neuro: Alert and oriented X 3. Lungs:  Resp regular and  unlabored while talking on the phone.   ASSESSMENT & PLAN:    1. Chest Pain with Mixed Features - She describes episodes of chest pressure which typically occur at night and can radiate into her left shoulder and neck. Symptoms are not exacerbated with exertion and she is active in walking over a mile each day and reports having palpitations with this but denies any chest pain or dyspnea at that time. - Given her multiple cardiac risk factors including HLD, IDDM, and family history of CAD, reviewed options with the patient and will plan to proceed with a Coronary CT for further ischemic evaluation.  2. HLD - Followed by PCP. FLP in 05/2018 showed total cholesterol 260, triglycerides 97, HDL 87 and LDL 154. If noted to have coronary calcifications by CT imaging, would recommend titration of statin therapy. She is currently on Atorvastatin 20 mg daily.  3. IDDM - Hgb A1c elevated at 9.9 in 03/2019. Followed by PCP.   4. Hypothyroidism - TSH at 3.080 in 03/2019. Remains on Synthroid 125 mcg daily.    COVID-19 Education: The signs and symptoms of COVID-19 were discussed with the patient and how to seek care for testing (follow up with PCP or arrange E-visit).  The importance of social distancing was discussed today.  Time:   Today, I have spent 19 minutes with the patient with telehealth technology discussing the above problems.     Medication Adjustments/Labs and Tests Ordered: Current medicines are reviewed at length with the patient today.  Concerns regarding medicines are outlined above.   Tests Ordered: Orders Placed This Encounter  Procedures  . CT CORONARY MORPH W/CTA COR W/SCORE W/CA W/CM &/OR WO/CM  . CT CORONARY FRACTIONAL FLOW RESERVE DATA PREP  . CT CORONARY FRACTIONAL FLOW RESERVE FLUID ANALYSIS  . Basic Metabolic Panel (BMET)    Medication Changes: Meds ordered this encounter  Medications  . metoprolol tartrate (LOPRESSOR) 100 MG tablet    Sig: Take Two Hours Prior  to CT Scan    Dispense:  1 tablet    Refill:  0    Follow Up:  In Person in 3 month(s)  Signed, Erma Heritage, PA-C  06/30/2019 5:24 PM    [place]

## 2019-06-30 NOTE — Patient Instructions (Signed)
Medication Instructions:  Your physician recommends that you continue on your current medications as directed. Please refer to the Current Medication list given to you today.  *If you need a refill on your cardiac medications before your next appointment, please call your pharmacy*  Lab Work: Your physician recommends that you return for lab work in: Just before your CT Scan   If you have labs (blood work) drawn today and your tests are completely normal, you will receive your results only by: Marland Kitchen MyChart Message (if you have MyChart) OR . A paper copy in the mail If you have any lab test that is abnormal or we need to change your treatment, we will call you to review the results.  Testing/Procedures: Coronary CT Scan   Follow-Up: At [facility], you and your health needs are our priority.  As part of our continuing mission to provide you with exceptional heart care, we have created designated Provider Care Teams.  These Care Teams include your primary Cardiologist (physician) and Advanced Practice Providers (APPs -  Physician Assistants and Nurse Practitioners) who all work together to provide you with the care you need, when you need it.  Your next appointment:   3 month(s)  The format for your next appointment:   In Person  Provider:   Carlyle Dolly, MD  Other Instructions Thank you for choosing [facility]!  Your cardiac CT will be scheduled at one of the below locations:   [facility] [address] [place], [place] [address] (336) [place] [address] [place], [place] [address] [phone]  If scheduled at [facility], please arrive at the [facility] main entrance of [facility] 30-45 minutes prior to test start time. Proceed to the [facility] Radiology Department (first floor) to check-in and test prep.  If scheduled at [facility], please arrive 15 mins early for check-in and test prep.  Please follow these instructions carefully (unless otherwise directed):  Hold all erectile dysfunction medications at least 3 days (72 hrs) prior to test.  On the Night Before the Test: . Be sure to Drink plenty of water. . Do not consume any caffeinated/decaffeinated beverages or chocolate 12 hours prior to your test. . Do not take any antihistamines 12 hours prior to your test. . If the patient has contrast allergy: ? Patient will need a prescription for Prednisone and very clear instructions (as follows): 1. Prednisone 50 mg - take 13 hours prior to test 2. Take another Prednisone 50 mg 7 hours prior to test 3. Take another Prednisone 50 mg 1 hour prior to test 4. Take Benadryl 50 mg 1 hour prior to test . Patient must complete all four doses of above prophylactic medications. . Patient will need a ride after test due to Benadryl.  On the Day of the Test: . Drink plenty of water. Do not drink any water within one hour of the test. . Do not eat any food 4 hours prior to the test. . You may take your regular medications prior to the test.  . Take metoprolol (Lopressor) two hours prior to test. . HOLD Furosemide/Hydrochlorothiazide morning of the test. . FEMALES- please wear underwire-free bra if available   *For Clinical Staff only. Please instruct patient the following:*        -Drink plenty of water       -Hold Furosemide/hydrochlorothiazide morning of the test       -  Take metoprolol (Lopressor) 2 hours prior to test (if applicable).                  -If HR is less than 55 BPM- No Beta Blocker                -IF HR is greater than 55 BPM and patient is less than or equal to 78 yrs old Lopressor 100mg  x1.                -If HR is greater than 55 BPM and patient is greater than 27 yrs old Lopressor 50 mg x1.     Do not give Lopressor to patients with an allergy to lopressor or anyone with asthma or active COPD  symptoms (currently taking steroids).       After the Test: . Drink plenty of water. . After receiving IV contrast, you may experience a mild flushed feeling. This is normal. . On occasion, you may experience a mild rash up to 24 hours after the test. This is not dangerous. If this occurs, you can take Benadryl 25 mg and increase your fluid intake. . If you experience trouble breathing, this can be serious. If it is severe call 911 IMMEDIATELY. If it is mild, please call our office. . If you take any of these medications: Glipizide/Metformin, Avandament, Glucavance, please do not take 48 hours after completing test unless otherwise instructed.   Once we have confirmed authorization from your insurance company, we will call you to set up a date and time for your test.   For non-scheduling related questions, please contact the cardiac imaging nurse navigator should you have any questions/concerns: Marchia Bond, RN Navigator Cardiac Imaging [facility] [phone] Office

## 2019-07-27 ENCOUNTER — Other Ambulatory Visit: Payer: Self-pay

## 2019-07-28 ENCOUNTER — Encounter: Payer: Self-pay | Admitting: Family Medicine

## 2019-07-28 ENCOUNTER — Other Ambulatory Visit: Payer: Self-pay

## 2019-07-28 ENCOUNTER — Ambulatory Visit (INDEPENDENT_AMBULATORY_CARE_PROVIDER_SITE_OTHER): Payer: PPO | Admitting: Family Medicine

## 2019-07-28 VITALS — BP 140/78 | HR 89 | Temp 99.0°F | Ht 62.0 in | Wt 206.0 lb

## 2019-07-28 DIAGNOSIS — E1169 Type 2 diabetes mellitus with other specified complication: Secondary | ICD-10-CM

## 2019-07-28 DIAGNOSIS — Z872 Personal history of diseases of the skin and subcutaneous tissue: Secondary | ICD-10-CM | POA: Diagnosis not present

## 2019-07-28 DIAGNOSIS — Z794 Long term (current) use of insulin: Secondary | ICD-10-CM

## 2019-07-28 DIAGNOSIS — E1165 Type 2 diabetes mellitus with hyperglycemia: Secondary | ICD-10-CM | POA: Diagnosis not present

## 2019-07-28 DIAGNOSIS — E118 Type 2 diabetes mellitus with unspecified complications: Secondary | ICD-10-CM | POA: Diagnosis not present

## 2019-07-28 DIAGNOSIS — Z23 Encounter for immunization: Secondary | ICD-10-CM | POA: Diagnosis not present

## 2019-07-28 DIAGNOSIS — E785 Hyperlipidemia, unspecified: Secondary | ICD-10-CM

## 2019-07-28 DIAGNOSIS — R7989 Other specified abnormal findings of blood chemistry: Secondary | ICD-10-CM | POA: Diagnosis not present

## 2019-07-28 DIAGNOSIS — E039 Hypothyroidism, unspecified: Secondary | ICD-10-CM

## 2019-07-28 DIAGNOSIS — IMO0002 Reserved for concepts with insufficient information to code with codable children: Secondary | ICD-10-CM

## 2019-07-28 DIAGNOSIS — Z1231 Encounter for screening mammogram for malignant neoplasm of breast: Secondary | ICD-10-CM

## 2019-07-28 DIAGNOSIS — I10 Essential (primary) hypertension: Secondary | ICD-10-CM | POA: Diagnosis not present

## 2019-07-28 DIAGNOSIS — E1159 Type 2 diabetes mellitus with other circulatory complications: Secondary | ICD-10-CM

## 2019-07-28 LAB — BAYER DCA HB A1C WAIVED: HB A1C (BAYER DCA - WAIVED): 8.7 % — ABNORMAL HIGH (ref <7.0)

## 2019-07-28 MED ORDER — METFORMIN HCL 500 MG PO TABS: 1000.0000 mg | ORAL_TABLET | Freq: Every day | ORAL | 1 refills | Status: DC

## 2019-07-28 NOTE — Progress Notes (Signed)
Subjective: CC: Dm2 f/u PCP: Janora Norlander, DO [id number] R Goodner is a 64 y.o. female presenting to clinic today for:  1. Uncontrolled Type 2 Diabetes with hypertension hyperlipidemia:  Patient was evaluated by televisit in September.  At that time her A1c had gone up quite a bit to 9.9.  She wanted to try lifestyle modification and was encouraged to follow Mediterranean diet.  Additionally, she was instructed to increase her Lantus by 1 unit every 2 days for fasting blood sugars greater than 150.  She was asked to keep an eye on her postprandial blood sugars.  Metformin was changed to 1000 mg every morning because she was forgetting the afternoon doses.  She reports to me today that blood sugars have been running still above average in the 170s to the highest in the 300s.  She has been adjusting her Lantus pending what her morning and evening blood sugars are.  She is injecting anywhere between 22 and 30 units.  She forgot that she was supposed to increase by 1 unit every 2 days for fasting blood sugars greater than 150 and stick with that Lantus dose.  She continues to use NovoLog with each meal.  She has adjusted her metformin up to 1000 mg every morning and she is remembering to do this.  She notes that she has had some left-sided shoulder pain and is going to be having a cardiac scan soon on 1 February with Dr. Harl Bowie.  They are going to rule out cardiac etiology.  If this is negative, she thinks she may want to see an orthopedist.  She describes the pain as intermittent and not related to activity.  It radiates from the lower neck down her shoulder.  It often occurs at rest.  Last eye exam: UTD Last foot exam: UTD Last A1c:  Lab Results  Component Value Date   HGBA1C 9.9 (H) 04/03/2019   Nephropathy screen indicated?: on ARB Last flu, zoster and/or pneumovax:  Immunization History  Administered Date(s) Administered  . Influenza Whole 05/16/2010  . Influenza,inj,Quad PF,6+ Mos  05/10/2015, 04/26/2016, 06/19/2017, 05/28/2018  . Pneumococcal Polysaccharide-23 07/16/2008, 01/30/2016  . Td 07/17/2007    ROS: Denies dizziness, LOC, polyuria, polydipsia, unintended weight loss/gain, foot ulcerations, numbness or tingling in extremities, shortness of breath or chest pain.  2. Hypothyroidism  Compliant with Synthroid 125 mcg daily.  Does not report any heart palpitations, unplanned weight loss.   ROS: Per HPI  Allergies  Allergen Reactions  . Codeine Nausea Only and Other (See Comments)    NAUSEA AND CAUSES HER TO HAVE BAD DREAMS  . Gabapentin     Confusion   . Lisinopril     Coughing   Past Medical History:  Diagnosis Date  . Anemia 07/23/2011  . Anxiety   . Diabetes mellitus   . Diabetic gastroparesis ([place]) 07/23/2011   Suspected  . Headache(784.0)   . Hearing difficulty   . Hyperlipidemia   . Hypothyroidism   . Kidney stones   . Microcytic anemia 07/23/2011  . Thalassemia minor    ????? not really worked up per pt but daughter has it???    Current Outpatient Medications:  .  aspirin 81 MG tablet, Take 81 mg by mouth daily., Disp: , Rfl:  .  atorvastatin (LIPITOR) 20 MG tablet, Take 1 tablet (20 mg total) by mouth daily., Disp: 90 tablet, Rfl: 3 .  docusate sodium (COLACE) 100 MG capsule, Take 200 mg by mouth daily as needed. , Disp: ,  Rfl:  .  famotidine (PEPCID) 20 MG tablet, Take 1 tablet (20 mg total) by mouth 2 (two) times daily. (Patient taking differently: Take 20 mg by mouth 2 (two) times daily as needed. ), Disp: 30 tablet, Rfl: 2 .  glucose blood (GNP EASY TOUCH GLUCOSE TEST) test strip, Test glucose daily Dx E11.69, Disp: 100 each, Rfl: 3 .  insulin glargine (LANTUS) 100 UNIT/ML injection, Inject 0.22-0.3 mLs (22-30 Units total) into the skin at bedtime. INJECT 22 UNITS SQ AT BEDTIME, Disp: 10 mL, Rfl: 6 .  Insulin Pen Needle (PEN NEEDLES) 32G X 4 MM MISC, 1 each by Does not apply route 4 (four) times daily., Disp: 100 each, Rfl: 12 .  Insulin  Syringe-Needle U-100 (TRUEPLUS INSULIN SYRINGE) 31G X 5/16" 1 ML MISC, AT BEDTIME Dx E11.8, Disp: 90 each, Rfl: 3 .  levothyroxine (SYNTHROID) 125 MCG tablet, Take 1 tablet (125 mcg total) by mouth daily., Disp: 90 tablet, Rfl: 1 .  losartan (COZAAR) 25 MG tablet, TAKE ONE (1) TABLET EACH DAY, Disp: 90 tablet, Rfl: 1 .  metFORMIN (GLUCOPHAGE) 500 MG tablet, Take 1 tablet (500 mg total) by mouth 2 (two) times daily with a meal., Disp: 180 tablet, Rfl: 1 .  metoprolol tartrate (LOPRESSOR) 100 MG tablet, Take Two Hours Prior to CT Scan, Disp: 1 tablet, Rfl: 0 .  Multiple Vitamin (MULTIVITAMIN WITH MINERALS) TABS tablet, Take 1 tablet by mouth daily., Disp: , Rfl:  .  NOVOLOG FLEXPEN 100 UNIT/ML FlexPen, INJECT 10-17 UNITS SQ 3 TIMES DAILY WITHMEALS, Disp: 15 mL, Rfl: 0 .  SUMAtriptan (IMITREX) 100 MG tablet, TAKE 1 TABLET AT ONSET OF MIGRAINE HEADACHE MAY REPEAT ONCE IN 2 HOURS (MAX OF 2 TABLETS/24 HOURS), Disp: 9 tablet, Rfl: 2 Social History   Socioeconomic History  . Marital status: Married    Spouse name: [name]  . Number of children: 1  . Years of education: Not on file  . Highest education level: Some college, no degree  Occupational History  . 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 Occupation: Retired  Tobacco Use  . Smoking status: Never Smoker  . Smokeless tobacco: Never Used  . Tobacco comment: Never smoked  Substance and Sexual Activity  . Alcohol use: No    Alcohol/week: 0.0 standard drinks  . Drug use: No  . Sexual activity: Not Currently  Other Topics Concern  . Not on file  Social History Narrative  . Not on file   Social Determinants of Health   Financial Resource Strain: Low Risk   . Difficulty of Paying Living Expenses: Not hard at all  Food Insecurity: No Food Insecurity  . Worried About Charity fundraiser in the Last Year: Never true  . Ran Out of Food in the Last Year: Never true  Transportation Needs: No Transportation  Needs  . Lack of Transportation (Medical): No  . Lack of Transportation (Non-Medical): No  Physical Activity: Inactive  . Days of Exercise per Week: 0 days  . Minutes of Exercise per Session: 0 min  Stress: No Stress Concern Present  . Feeling of Stress : Not at all  Social Connections: Not Isolated  . Frequency of Communication with Friends and Family: More than three times a week  . Frequency of Social Gatherings with Friends and Family: More than three times a week  . Attends Religious Services: More than 4 times per year  . Active Member of Clubs or Organizations: Yes  . Attends  Club or Organization Meetings: More than 4 times per year  . Marital Status: Married  Human resources officer Violence: Not At Risk  . Fear of Current or Ex-Partner: No  . Emotionally Abused: No  . Physically Abused: No  . Sexually Abused: No   Family History  Problem Relation Age of Onset  . Colon cancer Maternal Grandmother        age 63  . Colon polyps Mother     Objective: Office vital signs reviewed. BP 140/78   Pulse 89   Temp 99 F (37.2 C) (Temporal)   Ht 5\' 2"  (1.575 m)   Wt 206 lb (93.4 kg)   SpO2 97%   BMI 37.68 kg/m   Physical Examination:  General: Awake, alert, well nourished, No acute distress HEENT: Normal; sclera white.  No exophthalmos.  No goiter Cardio: regular rate and rhythm, S1S2 heard, no murmurs appreciated Pulm: clear to auscultation bilaterally, no wheezes, rhonchi or rales; normal work of breathing on room air Extremities: warm, well perfused, No edema, cyanosis or clubbing; +2 pulses bilaterally MSK: normal gait and station Skin: dry; intact; no rashes or lesions Neuro: Diabetic foot exam as below  Diabetic Foot Exam - Simple   Simple Foot Form Diabetic Foot exam was performed with the following findings: Yes 07/28/2019  2:12 PM  Visual Inspection No deformities, no ulcerations, no other skin breakdown bilaterally: Yes Sensation Testing Intact to touch and  monofilament testing bilaterally: Yes Pulse Check Posterior Tibialis and Dorsalis pulse intact bilaterally: Yes Comments Slightly decreased vibratory sensation.  No skin breakdown.  No preulcerative calluses.  She has good blood flow to the feet bilaterally.    Assessment/ Plan: 64 y.o. female   1. Uncontrolled type 2 diabetes mellitus with complication, with long-term current use of insulin (HCC) A1c improving but still uncontrolled at 8.7 today.  I reiterated instructions of increasing her Lantus by 1 unit every 2 days for fasting blood sugars above 150.  She was able to repeat this back to me and this was reiterated in her AVS.  We will follow-up in 3 months.  Metformin renewed.  Continue sliding scale insulin. - Bayer DCA Hb A1c Waived - metFORMIN (GLUCOPHAGE) 500 MG tablet; Take 2 tablets (1,000 mg total) by mouth daily with breakfast.  Dispense: 180 tablet; Refill: 1  2. Hypertension associated with diabetes ([place]) Controlled - Basic Metabolic Panel  3. Hyperlipidemia associated with type 2 diabetes mellitus ([place]) Continue statin  4. Acquired hypothyroidism Asymptomatic - Thyroid Panel With TSH  5. Encounter for screening mammogram for malignant neoplasm of breast We will get scheduled at [facility] due to history of cystic breast - MM SCREENING BREAST TOMO BILATERAL; Future  6. History of cyst of breast - MM SCREENING BREAST TOMO BILATERAL; Future  7. Abnormal CBC Check CBC.  There is apparently a question of possible thalassemia in her past - CBC   Orders Placed This Encounter  Procedures  . MM SCREENING BREAST TOMO BILATERAL    Standing Status:   Future    Standing Expiration Date:   09/24/2020    Order Specific Question:   Reason for Exam (SYMPTOM  OR DIAGNOSIS REQUIRED)    Answer:   screening mammo    Order Specific Question:   Preferred imaging location?    Answer:   [facility] [place] Hb A1c Waived  . Thyroid Panel With TSH  . Basic  Metabolic Panel    Order Specific Question:  CC Results    Answer:   Arnoldo Lenis [id number]  . CBC   Meds ordered this encounter  Medications  . metFORMIN (GLUCOPHAGE) 500 MG tablet    Sig: Take 2 tablets (1,000 mg total) by mouth daily with breakfast.    Dispense:  180 tablet    Refill:  [place], DO [place] [phone]

## 2019-07-28 NOTE — Patient Instructions (Signed)
I have ordered your mammogram.  If you have not been called in 1 week for an appointment, please call the office.  Please call your gastroenterologist and schedule your colonoscopy.  Sugar is improving but not at goal.  Her goal A1c is less than 7.  Today it was 8.7.  If your FASTING blood sugar is above 150 for 2 consecutive days, increase by 1 unit of your long acting insulin.  Example: Day 1: Blood sugar was 159 Day 2: Blood sugar was 205  Increase Lantus to 22 units Day 3: Blood sugar is 202 Day 4: Blood sugars 168  Increase Lantus to 23 units Day 5: Blood sugar is 105 Day 6: Blood sugars 145  Stick with 23 units.  Do not increase.

## 2019-07-29 ENCOUNTER — Other Ambulatory Visit: Payer: Self-pay | Admitting: Family Medicine

## 2019-07-29 LAB — BASIC METABOLIC PANEL
BUN/Creatinine Ratio: 19 (ref 12–28)
BUN: 16 mg/dL (ref 8–27)
CO2: 26 mmol/L (ref 20–29)
Calcium: 9.9 mg/dL (ref 8.7–10.3)
Chloride: 103 mmol/L (ref 96–106)
Creatinine, Ser: 0.83 mg/dL (ref 0.57–1.00)
GFR calc Af Amer: 87 mL/min/{1.73_m2} (ref >59)
GFR calc non Af Amer: 76 mL/min/{1.73_m2} (ref >59)
Glucose: 107 mg/dL — ABNORMAL HIGH (ref 65–99)
Potassium: 4.3 mmol/L (ref 3.5–5.2)
Sodium: 142 mmol/L (ref 134–144)

## 2019-07-29 LAB — THYROID PANEL WITH TSH
Free Thyroxine Index: 1.7 (ref 1.2–4.9)
T3 Uptake Ratio: 25 % (ref 24–39)
T4, Total: 6.9 ug/dL (ref 4.5–12.0)
TSH: 8.67 u[IU]/mL — ABNORMAL HIGH (ref 0.450–4.500)

## 2019-07-29 LAB — CBC
Hematocrit: 41.9 % (ref 34.0–46.6)
Hemoglobin: 12.8 g/dL (ref 11.1–15.9)
MCH: 20.8 pg — ABNORMAL LOW (ref 26.6–33.0)
MCHC: 30.5 g/dL — ABNORMAL LOW (ref 31.5–35.7)
MCV: 68 fL — ABNORMAL LOW (ref 79–97)
Platelets: 335 10*3/uL (ref 150–450)
RBC: 6.14 x10E6/uL — ABNORMAL HIGH (ref 3.77–5.28)
RDW: 16.3 % — ABNORMAL HIGH (ref 11.7–15.4)
WBC: 8.7 10*3/uL (ref 3.4–10.8)

## 2019-07-29 MED ORDER — LEVOTHYROXINE SODIUM 137 MCG PO TABS: 137.0000 ug | ORAL_TABLET | Freq: Every day | ORAL | 0 refills | Status: DC

## 2019-08-13 ENCOUNTER — Encounter (HOSPITAL_COMMUNITY): Payer: Self-pay

## 2019-08-14 ENCOUNTER — Telehealth (HOSPITAL_COMMUNITY): Payer: Self-pay | Admitting: Emergency Medicine

## 2019-08-14 NOTE — Telephone Encounter (Signed)
Reaching out to patient to offer assistance regarding upcoming cardiac imaging study; pt verbalizes understanding of appt date/time, parking situation and where to check in, pre-test NPO status and medications ordered, and verified current allergies; name and call back number provided for further questions should they arise Melissa Olveda RN Navigator Cardiac Imaging  Heart and Vascular [phone] office [phone] cell 

## 2019-08-17 ENCOUNTER — Other Ambulatory Visit: Payer: Self-pay

## 2019-08-17 ENCOUNTER — Ambulatory Visit (HOSPITAL_COMMUNITY)
Admission: RE | Admit: 2019-08-17 | Discharge: 2019-08-17 | Disposition: A | Discharge status: Home / Self Care | Payer: PPO | Source: Ambulatory Visit | Attending: Student | Admitting: Student

## 2019-08-17 DIAGNOSIS — I2510 Atherosclerotic heart disease of native coronary artery without angina pectoris: Secondary | ICD-10-CM | POA: Diagnosis not present

## 2019-08-17 DIAGNOSIS — R079 Chest pain, unspecified: Secondary | ICD-10-CM | POA: Insufficient documentation

## 2019-08-17 DIAGNOSIS — R072 Precordial pain: Secondary | ICD-10-CM | POA: Diagnosis not present

## 2019-08-17 LAB — GLUCOSE, CAPILLARY: Glucose-Capillary: 159 mg/dL — ABNORMAL HIGH (ref 70–99)

## 2019-08-17 MED ORDER — NITROGLYCERIN 0.4 MG SL SUBL
SUBLINGUAL_TABLET | SUBLINGUAL | Status: AC
  Filled 2019-08-17: qty 2

## 2019-08-17 MED ORDER — NITROGLYCERIN 0.4 MG SL SUBL
0.8000 mg | SUBLINGUAL_TABLET | Freq: Once | SUBLINGUAL | Status: AC
  Administered 2019-08-17: 0.8 mg via SUBLINGUAL

## 2019-08-17 MED ORDER — IOHEXOL 350 MG/ML SOLN
80.0000 mL | Freq: Once | INTRAVENOUS | Status: AC | PRN
  Administered 2019-08-17: 80 mL via INTRAVENOUS

## 2019-08-26 ENCOUNTER — Ambulatory Visit (HOSPITAL_COMMUNITY)
Admission: RE | Admit: 2019-08-26 | Discharge: 2019-08-26 | Disposition: A | Discharge status: Home / Self Care | Payer: PPO | Source: Ambulatory Visit | Attending: Family Medicine | Admitting: Family Medicine

## 2019-08-26 ENCOUNTER — Other Ambulatory Visit: Payer: Self-pay

## 2019-08-26 DIAGNOSIS — Z872 Personal history of diseases of the skin and subcutaneous tissue: Secondary | ICD-10-CM | POA: Insufficient documentation

## 2019-08-26 DIAGNOSIS — Z1231 Encounter for screening mammogram for malignant neoplasm of breast: Secondary | ICD-10-CM | POA: Diagnosis not present

## 2019-09-16 ENCOUNTER — Other Ambulatory Visit: Payer: Self-pay | Admitting: Family Medicine

## 2019-09-16 DIAGNOSIS — IMO0002 Reserved for concepts with insufficient information to code with codable children: Secondary | ICD-10-CM

## 2019-09-16 DIAGNOSIS — E1165 Type 2 diabetes mellitus with hyperglycemia: Secondary | ICD-10-CM

## 2019-09-29 ENCOUNTER — Encounter: Payer: Self-pay | Admitting: Cardiology

## 2019-09-29 ENCOUNTER — Ambulatory Visit: Payer: PPO | Admitting: Cardiology

## 2019-09-29 VITALS — BP 126/80 | HR 82 | Temp 97.3°F | Ht 62.0 in | Wt 205.0 lb

## 2019-09-29 DIAGNOSIS — I10 Essential (primary) hypertension: Secondary | ICD-10-CM | POA: Diagnosis not present

## 2019-09-29 DIAGNOSIS — R079 Chest pain, unspecified: Secondary | ICD-10-CM

## 2019-09-29 DIAGNOSIS — E782 Mixed hyperlipidemia: Secondary | ICD-10-CM | POA: Diagnosis not present

## 2019-09-29 NOTE — Progress Notes (Signed)
Clinical Summary Ms. Villela is a 64 y.o.female seen today for follow up of the following medical problems.   1. Chest pain - started off and on x 10 years ago. Squeezing like feeling left sided, 4-5/10. Often occurs at rest. No other associated symptoms. Not positional, but can feel better with deep breaths. Lasts up to 1 hour. Occurs approx once a week. No relation to food. Can have some dysphagia at times that is separate. Has had some increase in frequency, stable severity - can get some DOE, example walking up stairs which is new, though she does report 40 lbs weight gain over the last year. - no LE edema, no orthopnea, no PND.  CAD risk factors: DM2, HL, brother MI age 26.    - 01/2017 nuclear stress no ischemia. - some improvement with pepcid. Can have reflux. - referred to GI last visit, does not appear she has seen them  - no recent chest pain. Can have some left arm pain at times. Shoulder down into bicep. Not positional. Can last thoughout night.  - awaiting GI appointment since last visit  08/2019 coronary CTA: minimal CAD, coronary CA score 10, no significant disease.  - still with some pains at times, she has pcp f/u to discuss alternative causes.   2. HTN - compliant with meds  3. Hyperlipidemia - compliant with statin. Labs followed by pcp, needs repeat   Past Medical History:  Diagnosis Date  . Anemia 07/23/2011  . Anxiety   . Diabetes mellitus   . Diabetic gastroparesis ([place]) 07/23/2011   Suspected  . Headache(784.0)   . Hearing difficulty   . Hyperlipidemia   . Hypothyroidism   . Kidney stones   . Microcytic anemia 07/23/2011  . Thalassemia minor    ????? not really worked up per pt but daughter has it???     Allergies  Allergen Reactions  . Codeine Nausea Only and Other (See Comments)    NAUSEA AND CAUSES HER TO HAVE BAD DREAMS  . Gabapentin     Confusion   . Lisinopril     Coughing     Current Outpatient Medications  Medication  Sig Dispense Refill  . aspirin 81 MG tablet Take 81 mg by mouth daily.    Marland Kitchen atorvastatin (LIPITOR) 20 MG tablet Take 1 tablet (20 mg total) by mouth daily. 90 tablet 3  . docusate sodium (COLACE) 100 MG capsule Take 200 mg by mouth daily as needed.     . famotidine (PEPCID) 20 MG tablet Take 1 tablet (20 mg total) by mouth 2 (two) times daily. (Patient taking differently: Take 20 mg by mouth 2 (two) times daily as needed. ) 30 tablet 2  . glucose blood (GNP EASY TOUCH GLUCOSE TEST) test strip Test glucose daily Dx E11.69 100 each 3  . Insulin Pen Needle (PEN NEEDLES) 32G X 4 MM MISC 1 each by Does not apply route 4 (four) times daily. 100 each 12  . Insulin Syringe-Needle U-100 (TRUEPLUS INSULIN SYRINGE) 31G X 5/16" 1 ML MISC AT BEDTIME Dx E11.8 90 each 3  . LANTUS 100 UNIT/ML injection INJECT 0.22-0.3 ML SQ AT BEDTIME (22-30 UNITS TOTAL) 10 mL 1  . levothyroxine (SYNTHROID) 137 MCG tablet Take 1 tablet (137 mcg total) by mouth daily before breakfast. 90 tablet 0  . losartan (COZAAR) 25 MG tablet TAKE ONE (1) TABLET EACH DAY 90 tablet 1  . metFORMIN (GLUCOPHAGE) 500 MG tablet Take 2 tablets (1,000 mg total) by  mouth daily with breakfast. 180 tablet 1  . metoprolol tartrate (LOPRESSOR) 100 MG tablet Take Two Hours Prior to CT Scan 1 tablet 0  . Multiple Vitamin (MULTIVITAMIN WITH MINERALS) TABS tablet Take 1 tablet by mouth daily.    Marland Kitchen NOVOLOG FLEXPEN 100 UNIT/ML FlexPen INJECT 10-17 UNITS SQ 3 TIMES DAILY WITHMEALS 15 mL 0  . SUMAtriptan (IMITREX) 100 MG tablet TAKE 1 TABLET AT ONSET OF MIGRAINE HEADACHE MAY REPEAT ONCE IN 2 HOURS (MAX OF 2 TABLETS/24 HOURS) 9 tablet 2   No current facility-administered medications for this visit.     Past Surgical History:  Procedure Laterality Date  . COLONOSCOPY  2004   Dr. Jim Desanctis: small fissue and hemorrhoids  . COLONOSCOPY  12/2011   Dr. Paulita Fujita: normal  . CYSTOSCOPY W/ URETERAL STENT PLACEMENT Right 08/13/2013   Procedure: CYSTOSCOPY WITH  RETROGRADE PYELOGRAM/URETERAL STENT PLACEMENT;  Surgeon: Marissa Nestle, MD;  Location: [facility];  Service: Urology;  Laterality: Right;  . EXTRACORPOREAL SHOCK WAVE LITHOTRIPSY Right 08/19/2013   Procedure: EXTRACORPOREAL SHOCK WAVE LITHOTRIPSY (ESWL) RIGHT URETERAL CALCULUS;  Surgeon: Marissa Nestle, MD;  Location: [facility];  Service: Urology;  Laterality: Right;  . WRIST SURGERY Right      Allergies  Allergen Reactions  . Codeine Nausea Only and Other (See Comments)    NAUSEA AND CAUSES HER TO HAVE BAD DREAMS  . Gabapentin     Confusion   . Lisinopril     Coughing      Family History  Problem Relation Age of Onset  . Colon cancer Maternal Grandmother        age 58  . Colon polyps Mother      Social History Ms. Lozowski reports that she has never smoked. She has never used smokeless tobacco. Ms. Farnan reports no history of alcohol use.   Review of Systems CONSTITUTIONAL: No weight loss, fever, chills, weakness or fatigue.  HEENT: Eyes: No visual loss, blurred vision, double vision or yellow sclerae.No hearing loss, sneezing, congestion, runny nose or sore throat.  SKIN: No rash or itching.  CARDIOVASCULAR: per hpi RESPIRATORY: No shortness of breath, cough or sputum.  GASTROINTESTINAL: No anorexia, nausea, vomiting or diarrhea. No abdominal pain or blood.  GENITOURINARY: No burning on urination, no polyuria NEUROLOGICAL: No headache, dizziness, syncope, paralysis, ataxia, numbness or tingling in the extremities. No change in bowel or bladder control.  MUSCULOSKELETAL: No muscle, back pain, joint pain or stiffness.  LYMPHATICS: No enlarged nodes. No history of splenectomy.  PSYCHIATRIC: No history of depression or anxiety.  ENDOCRINOLOGIC: No reports of sweating, cold or heat intolerance. No polyuria or polydipsia.  Marland Kitchen   Physical Examination Today's Vitals   09/29/19 1457  BP: 126/80  Pulse: 82  Temp: (!) 97.3 F (36.3 C)  SpO2: 97%  Weight: 205 lb (93 kg)    Height: 5\' 2"  (1.575 m)   Body mass index is 37.49 kg/m.  Gen: resting comfortably, no acute distress HEENT: no scleral icterus, pupils equal round and reactive, no palptable cervical adenopathy,  CV: [id number] systolic murmur rusb, no jvd Resp: Clear to auscultation bilaterally GI: abdomen is soft, non-tender, non-distended, normal bowel sounds, no hepatosplenomegaly MSK: extremities are warm, no edema.  Skin: warm, no rash Neuro:  no focal deficits Psych: appropriate affect   Diagnostic Studies  08/2015 GXT  There was no ST segment deviation noted during stress.  No T wave inversion was noted during stress.  Motion artifact No definite ST changes to suggest  ischemia   Finalized by Fay Records, MD on Mon Jan 23, 2016 3:32 PM   There was no ST segment deviation noted during stress.  No T wave inversion was noted during stress.  Pressure rate product is 20, 701. Test at marginal workload For heart rate achieved, there were no ischemic EKG changes Conclusion limited.     08/2015 echo Study Conclusions  - Left ventricle: The cavity size was normal. Wall thickness was at the upper limits of normal. Systolic function was normal. The estimated ejection fraction was in the range of 60% to 65%. Wall motion was normal; there were no regional wall motion abnormalities. Left ventricular diastolic function parameters were normal. - Ascending aorta: The ascending aorta was mildly ectatic. - Mitral valve: There was trivial regurgitation. - Right atrium: Central venous pressure (est): 3 mm Hg. - Tricuspid valve: There was trivial regurgitation. - Pulmonary arteries: PA peak pressure: 23 mm Hg (S). - Pericardium, extracardiac: There was no pericardial effusion.  Impressions:  - Upper normal LV wall thickness with LVEF 60-65% and grossly normal diastolic function. Mildly ectatic ascending aorta. Trivial mitral and tricuspid regurgitation. Estimated PASP  23 mmHg.   01/2017 nuclear stress  No diagnostic ST segment changes to indicate ischemia. Low risk Duke treadmill score 4.5.  Blood pressure demonstrated a hypertensive response to exercise.  Small, mild intensity, fixed anteroapical defect consistent with breast attenuation.  This is a low risk study.  Nuclear stress EF: 76%.    Assessment and Plan   1. Chest pain -negative cardiac CT for significant disease, her symptoms are not cardiac - she will f/u with pcp to consider other causes - continue CAD risk factor modification.   2. HTN - at goal, continue current meds   3. Hyperlipidemia - needs repeat lipid panel, she reports upcoming labs with pcp, will defer - continue statin.   F/u 6 months   Arnoldo Lenis, M.D.

## 2019-09-29 NOTE — Patient Instructions (Signed)

## 2019-10-01 ENCOUNTER — Telehealth: Payer: Self-pay | Admitting: Family Medicine

## 2019-10-01 NOTE — Chronic Care Management (AMB) (Signed)
  Chronic Care Management   Note  10/01/2019 Name: Melissa Schneider MRN: [id number] DOB: [birth year]  Melissa Schneider is a 64 y.o. year old female who is a primary care patient of Janora Norlander, DO. I reached out to Amedeo Plenty by phone today in response to a referral sent by [name]'s health plan.     [name] was given information about Chronic Care Management services today including:  1. CCM service includes personalized support from designated clinical staff supervised by her physician, including individualized plan of care and coordination with other care providers 2. 24/7 contact phone numbers for assistance for urgent and routine care needs. 3. Service will only be billed when office clinical staff spend 20 minutes or more in a month to coordinate care. 4. Only one practitioner may furnish and bill the service in a calendar month. 5. The patient may stop CCM services at any time (effective at the end of the month) by phone call to the office staff. 6. The patient will be responsible for cost sharing (co-pay) of up to 20% of the service fee (after annual deductible is met).  Patient agreed to services and verbal consent obtained.   Follow up plan: Telephone appointment with care management team member scheduled for:02/11/2020  Noreene Larsson, [address], [place], [place] [address] Direct Dial: [phone] [email or web address]: [email or web address]

## 2019-10-23 ENCOUNTER — Other Ambulatory Visit: Payer: Self-pay | Admitting: Family Medicine

## 2019-10-23 DIAGNOSIS — IMO0002 Reserved for concepts with insufficient information to code with codable children: Secondary | ICD-10-CM

## 2019-10-23 DIAGNOSIS — E1165 Type 2 diabetes mellitus with hyperglycemia: Secondary | ICD-10-CM

## 2019-10-26 ENCOUNTER — Ambulatory Visit: Payer: PPO | Admitting: Family Medicine

## 2019-10-28 ENCOUNTER — Other Ambulatory Visit: Payer: Self-pay | Admitting: Family Medicine

## 2019-10-28 DIAGNOSIS — E1165 Type 2 diabetes mellitus with hyperglycemia: Secondary | ICD-10-CM

## 2019-10-28 DIAGNOSIS — IMO0002 Reserved for concepts with insufficient information to code with codable children: Secondary | ICD-10-CM

## 2019-10-30 ENCOUNTER — Ambulatory Visit (INDEPENDENT_AMBULATORY_CARE_PROVIDER_SITE_OTHER): Payer: PPO | Admitting: Family Medicine

## 2019-10-30 DIAGNOSIS — IMO0002 Reserved for concepts with insufficient information to code with codable children: Secondary | ICD-10-CM

## 2019-10-30 DIAGNOSIS — E1165 Type 2 diabetes mellitus with hyperglycemia: Secondary | ICD-10-CM | POA: Diagnosis not present

## 2019-10-30 DIAGNOSIS — E039 Hypothyroidism, unspecified: Secondary | ICD-10-CM | POA: Diagnosis not present

## 2019-10-30 DIAGNOSIS — E1159 Type 2 diabetes mellitus with other circulatory complications: Secondary | ICD-10-CM

## 2019-10-30 DIAGNOSIS — E1169 Type 2 diabetes mellitus with other specified complication: Secondary | ICD-10-CM

## 2019-10-30 DIAGNOSIS — I152 Hypertension secondary to endocrine disorders: Secondary | ICD-10-CM

## 2019-10-30 DIAGNOSIS — E785 Hyperlipidemia, unspecified: Secondary | ICD-10-CM | POA: Diagnosis not present

## 2019-10-30 DIAGNOSIS — I10 Essential (primary) hypertension: Secondary | ICD-10-CM

## 2019-10-30 DIAGNOSIS — Z794 Long term (current) use of insulin: Secondary | ICD-10-CM | POA: Diagnosis not present

## 2019-10-30 DIAGNOSIS — E118 Type 2 diabetes mellitus with unspecified complications: Secondary | ICD-10-CM | POA: Diagnosis not present

## 2019-10-30 NOTE — Progress Notes (Addendum)
Telephone visit  Subjective: CC: DM2 PCP: Janora Norlander, DO [id number] R Longest is a 64 y.o. female calls for telephone consult today. Patient provides verbal consent for consult held via phone.  Due to COVID-19 pandemic this visit was conducted virtually. This visit type was conducted due to national recommendations for restrictions regarding the COVID-19 Pandemic (e.g. social distancing, sheltering in place) in an effort to limit this patient's exposure and mitigate transmission in our community. All issues noted in this document were discussed and addressed.  A physical exam was not performed with this format.   Location of patient: home Location of provider: [facility] Others present for call: none  1. Dm2 w/ HTN, HLD Patient's hemoglobin A1c was noted to be elevated at last visit 3 months ago.  She was instructed to increase her Lantus by 1 unit every 2 days for fasting blood sugars above 150.  She apparently has been titrating this up or down pending her blood sugars daily.  She forgot Lantus one night and it took it all weekend to get her sugar back down into the 130's.  Avg fasting blood sugar <150.  She is injecting 22 units QHS.  She is compliant with Metformin 1000 mg every morning.  No CP, SOB, dizziness, blurry vision.  No hypoglycemia.  No diarrhea, abdominal pain.   2.  Hypothyroidism Noted to be hypothyroid despite compliance with Synthroid.  Her dose was increased to 137 mcg daily.  Patient reports compliance with Synthroid 137 mcg daily.  Is not reporting heart palpitations, tremors, change in bowel habits or weight.   ROS: Per HPI  Allergies  Allergen Reactions  . Codeine Nausea Only and Other (See Comments)    NAUSEA AND CAUSES HER TO HAVE BAD DREAMS  . Gabapentin     Confusion   . Lisinopril     Coughing   Past Medical History:  Diagnosis Date  . Anemia 07/23/2011  . Anxiety   . Diabetes mellitus   . Diabetic gastroparesis ([place]) 07/23/2011   Suspected  .  Headache(784.0)   . Hearing difficulty   . Hyperlipidemia   . Hypothyroidism   . Kidney stones   . Microcytic anemia 07/23/2011  . Thalassemia minor    ????? not really worked up per pt but daughter has it???    Current Outpatient Medications:  .  aspirin 81 MG tablet, Take 81 mg by mouth daily., Disp: , Rfl:  .  atorvastatin (LIPITOR) 20 MG tablet, Take 1 tablet (20 mg total) by mouth daily., Disp: 90 tablet, Rfl: 3 .  docusate sodium (COLACE) 100 MG capsule, Take 200 mg by mouth daily as needed. , Disp: , Rfl:  .  glucose blood (GNP EASY TOUCH GLUCOSE TEST) test strip, Test glucose daily Dx E11.69, Disp: 100 each, Rfl: 3 .  Insulin Pen Needle (PEN NEEDLES) 32G X 4 MM MISC, 1 each by Does not apply route 4 (four) times daily., Disp: 100 each, Rfl: 12 .  Insulin Syringe-Needle U-100 (TRUEPLUS INSULIN SYRINGE) 31G X 5/16" 1 ML MISC, AT BEDTIME Dx E11.8, Disp: 90 each, Rfl: 3 .  LANTUS 100 UNIT/ML injection, INJECT 0.22-0.3 ML SQ AT BEDTIME (22-30 UNITS TOTAL), Disp: 10 mL, Rfl: 0 .  levothyroxine (SYNTHROID) 137 MCG tablet, Take 1 tablet (137 mcg total) by mouth daily before breakfast., Disp: 90 tablet, Rfl: 0 .  losartan (COZAAR) 25 MG tablet, TAKE ONE (1) TABLET EACH DAY, Disp: 90 tablet, Rfl: 1 .  metFORMIN (GLUCOPHAGE) 500 MG tablet, Take  2 tablets (1,000 mg total) by mouth daily with breakfast. (Patient taking differently: Take 1,000 mg by mouth daily with breakfast. ), Disp: 180 tablet, Rfl: 1 .  Multiple Vitamin (MULTIVITAMIN WITH MINERALS) TABS tablet, Take 1 tablet by mouth daily., Disp: , Rfl:  .  NOVOLOG FLEXPEN 100 UNIT/ML FlexPen, INJECT 10-17 UNITS SQ 3 TIMES DAILY WITHMEALS, Disp: 15 mL, Rfl: 0 .  SUMAtriptan (IMITREX) 100 MG tablet, TAKE 1 TABLET AT ONSET OF MIGRAINE HEADACHE MAY REPEAT ONCE IN 2 HOURS (MAX OF 2 TABLETS/24 HOURS), Disp: 9 tablet, Rfl: 2  Assessment/ Plan: 64 y.o. female   1. Uncontrolled type 2 diabetes mellitus with complication, with long-term current use  of insulin ([place]) Sugars sound controlled based on what her reports are today.  Check A1c. - Bayer DCA Hb A1c Waived; Future   2. Hypertension associated with diabetes ([place])  3. Hyperlipidemia associated with type 2 diabetes mellitus ([facility]) Check fasting lipid panel.  I will CC this to her cardiologist - Lipid panel; Future  4. Acquired hypothyroidism - Thyroid Panel With TSH; Future   Start time: 9:29am (no answer); 9:35am End time: 9:42pm  Total time spent on patient care (including telephone call/ virtual visit): 23 minutes  [place], [place] [phone]

## 2019-11-23 ENCOUNTER — Other Ambulatory Visit: Payer: Self-pay | Admitting: Family Medicine

## 2019-11-23 DIAGNOSIS — IMO0002 Reserved for concepts with insufficient information to code with codable children: Secondary | ICD-10-CM

## 2019-11-23 DIAGNOSIS — E1165 Type 2 diabetes mellitus with hyperglycemia: Secondary | ICD-10-CM

## 2019-11-23 DIAGNOSIS — Z794 Long term (current) use of insulin: Secondary | ICD-10-CM

## 2019-12-23 DIAGNOSIS — H25813 Combined forms of age-related cataract, bilateral: Secondary | ICD-10-CM | POA: Diagnosis not present

## 2019-12-23 DIAGNOSIS — D3132 Benign neoplasm of left choroid: Secondary | ICD-10-CM | POA: Diagnosis not present

## 2019-12-23 DIAGNOSIS — D3131 Benign neoplasm of right choroid: Secondary | ICD-10-CM | POA: Diagnosis not present

## 2019-12-23 DIAGNOSIS — E119 Type 2 diabetes mellitus without complications: Secondary | ICD-10-CM | POA: Diagnosis not present

## 2019-12-23 LAB — HM DIABETES EYE EXAM

## 2020-01-04 ENCOUNTER — Other Ambulatory Visit: Payer: Self-pay | Admitting: Family Medicine

## 2020-01-04 DIAGNOSIS — IMO0002 Reserved for concepts with insufficient information to code with codable children: Secondary | ICD-10-CM

## 2020-01-04 DIAGNOSIS — E1165 Type 2 diabetes mellitus with hyperglycemia: Secondary | ICD-10-CM

## 2020-01-19 ENCOUNTER — Ambulatory Visit (INDEPENDENT_AMBULATORY_CARE_PROVIDER_SITE_OTHER): Payer: PPO

## 2020-01-19 DIAGNOSIS — Z0000 Encounter for general adult medical examination without abnormal findings: Secondary | ICD-10-CM | POA: Diagnosis not present

## 2020-01-19 NOTE — Progress Notes (Signed)
MEDICARE ANNUAL WELLNESS VISIT  01/19/2020  Telephone Visit Disclaimer This Medicare AWV was conducted by telephone due to national recommendations for restrictions regarding the COVID-19 Pandemic (e.g. social distancing).  I verified, using two identifiers, that I am speaking with Melissa Schneider or their authorized healthcare agent. I discussed the limitations, risks, security, and privacy concerns of performing an evaluation and management service by telephone and the potential availability of an in-person appointment in the future. The patient expressed understanding and agreed to proceed.   Subjective:  Melissa Schneider is a 65 y.o. female patient of Janora Norlander, DO who had a Medicare Annual Wellness Visit today via telephone. Melissa Schneider is Retired and lives with their spouse. she has one child. she reports that she is socially active and does interact with friends/family regularly. she is minimally physically active.  Patient Care Team: Janora Norlander, DO as PCP - General (Family Medicine) Harl Bowie Alphonse Guild, MD as PCP - Cardiology (Cardiology) Gala Romney Cristopher Estimable, MD as Consulting Physician (Gastroenterology) Ilean China, RN as Registered Nurse  Advanced Directives 01/19/2020 01/12/2019 01/12/2019 04/29/2015 08/22/2013 08/13/2013 07/20/2011  Does Patient Have a Medical Advance Directive? No No No No Patient does not have advance directive;Patient would not like information Patient does not have advance directive;Patient would like information Patient does not have advance directive  Would patient like information on creating a medical advance directive? No - Patient declined Yes (MAU/Ambulatory/Procedural Areas - Information given) Yes (MAU/Ambulatory/Procedural Areas - Information given) No - patient declined information - Advance directive packet given -  Pre-existing out of facility DNR order (yellow form or pink MOST form) - - - - No No No    Hospital Utilization Over the  Past 12 Months: # of hospitalizations or ER visits: 0 # of surgeries: 0  Review of Systems    Patient reports that her overall health is unchanged compared to last year.   Patient Reported Readings (BP, Pulse, CBG-116, Weight, etc) none  Pain Assessment Pain : No/denies pain     Current Medications & Allergies (verified) Allergies as of 01/19/2020      Reactions   Codeine Nausea Only, Other (See Comments)   NAUSEA AND CAUSES HER TO HAVE BAD DREAMS   Gabapentin    Confusion   Lisinopril    Coughing      Medication List       Accurate as of January 19, 2020 10:28 AM. If you have any questions, ask your nurse or doctor.        aspirin 81 MG tablet Take 81 mg by mouth daily.   atorvastatin 20 MG tablet Commonly known as: LIPITOR Take 1 tablet (20 mg total) by mouth daily.   docusate sodium 100 MG capsule Commonly known as: COLACE Take 200 mg by mouth daily as needed.   GNP Easy Touch Glucose Test test strip Generic drug: glucose blood EVERY DAY   Insulin Syringe-Needle U-100 31G X 5/16" 1 ML Misc Commonly known as: TRUEplus Insulin Syringe AT BEDTIME Dx E11.8   Lantus 100 UNIT/ML injection Generic drug: insulin glargine INJECT 0.22-0.3 ML SQ AT BEDTIME (22-30 UNITS TOTAL)   levothyroxine 137 MCG tablet Commonly known as: SYNTHROID Take 1 tablet (137 mcg total) by mouth daily before breakfast. (Needs labwork)   losartan 25 MG tablet Commonly known as: COZAAR TAKE ONE (1) TABLET EACH DAY   metFORMIN 500 MG tablet Commonly known as: GLUCOPHAGE Take 2 tablets (1,000 mg total) by mouth daily with  breakfast.   multivitamin with minerals Tabs tablet Take 1 tablet by mouth daily.   NovoLOG FlexPen 100 UNIT/ML FlexPen Generic drug: insulin aspart INJECT 10-17 UNITS SQ 3 TIMES DAILY WITHMEALS   Pen Needles 32G X 4 MM Misc 1 each by Does not apply route 4 (four) times daily.   SUMAtriptan 100 MG tablet Commonly known as: IMITREX TAKE 1 TABLET AT ONSET OF  MIGRAINE HEADACHE MAY REPEAT ONCE IN 2 HOURS (MAX OF 2 TABLETS/24 HOURS)       History (reviewed): Past Medical History:  Diagnosis Date  . Anemia 07/23/2011  . Anxiety   . Diabetes mellitus   . Diabetic gastroparesis ([place]) 07/23/2011   Suspected  . Headache(784.0)   . Hearing difficulty   . Hyperlipidemia   . Hypothyroidism   . Kidney stones   . Microcytic anemia 07/23/2011  . Thalassemia minor    ????? not really worked up per pt but daughter has it???   Past Surgical History:  Procedure Laterality Date  . COLONOSCOPY  2004   Dr. Jim Desanctis: small fissue and hemorrhoids  . COLONOSCOPY  12/2011   Dr. Paulita Fujita: normal  . CYSTOSCOPY W/ URETERAL STENT PLACEMENT Right 08/13/2013   Procedure: CYSTOSCOPY WITH RETROGRADE PYELOGRAM/URETERAL STENT PLACEMENT;  Surgeon: Marissa Nestle, MD;  Location: [facility];  Service: Urology;  Laterality: Right;  . EXTRACORPOREAL SHOCK WAVE LITHOTRIPSY Right 08/19/2013   Procedure: EXTRACORPOREAL SHOCK WAVE LITHOTRIPSY (ESWL) RIGHT URETERAL CALCULUS;  Surgeon: Marissa Nestle, MD;  Location: [facility];  Service: Urology;  Laterality: Right;  . WRIST SURGERY Right    Family History  Problem Relation Age of Onset  . Colon cancer Maternal Grandmother        age 36  . Colon polyps Mother   . CAD Brother   . Rheum arthritis Sister    Social History   Socioeconomic History  . Marital status: Married    Spouse name: [name]  . Number of children: 1  . Years of education: Not on file  . Highest education level: Some college, no degree  Occupational History  . 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 Occupation: Retired  Tobacco Use  . Smoking status: Never Smoker  . Smokeless tobacco: Never Used  . Tobacco comment: Never smoked  Vaping Use  . Vaping Use: Never used  Substance and Sexual Activity  . Alcohol use: No    Alcohol/week: 0.0 standard drinks  . Drug use: No  . Sexual activity: Not Currently  Other Topics  Concern  . Not on file  Social History Narrative  . Not on file   Social Determinants of Health   Financial Resource Strain:   . Difficulty of Paying Living Expenses:   Food Insecurity:   . Worried About Charity fundraiser in the Last Year:   . Arboriculturist in the Last Year:   Transportation Needs:   . Film/video editor (Medical):   Marland Kitchen Lack of Transportation (Non-Medical):   Physical Activity:   . Days of Exercise per Week:   . Minutes of Exercise per Session:   Stress:   . Feeling of Stress :   Social Connections:   . Frequency of Communication with Friends and Family:   . Frequency of Social Gatherings with Friends and Family:   . Attends Religious Services:   . Active Member of Clubs or Organizations:   . Attends Archivist Meetings:   Marland Kitchen Marital Status:  Activities of Daily Living In your present state of health, do you have any difficulty performing the following activities: 01/19/2020  Hearing? Y  Vision? N  Difficulty concentrating or making decisions? N  Walking or climbing stairs? N  Dressing or bathing? N  Doing errands, shopping? N  Preparing Food and eating ? N  Using the Toilet? N  In the past six months, have you accidently leaked urine? N  Do you have problems with loss of bowel control? N  Managing your Medications? N  Managing your Finances? N  Housekeeping or managing your Housekeeping? N  Some recent data might be hidden    Patient Education/ Literacy How often do you need to have someone help you when you read instructions, pamphlets, or other written materials from your doctor or pharmacy?: 1 - Never What is the last grade level you completed in school?: Some College  Exercise Current Exercise Habits: Home exercise routine, Type of exercise: strength training/weights;stretching;walking, Time (Minutes): 20, Frequency (Times/Week): 4, Weekly Exercise (Minutes/Week): 80, Intensity: Mild  Diet Patient reports consuming 3 meals  a day and 0 snack(s) a day Patient reports that her primary diet is: [place] Patient reports that she does have regular access to food.   Depression Screen PHQ 2/9 Scores 01/19/2020 07/28/2019 01/12/2019 09/01/2018 05/28/2018 10/28/2017 09/30/2017  PHQ - 2 Score 0 0 0 0 2 0 0  PHQ- 9 Score - 0 - 2 5 - -     Fall Risk Fall Risk  01/19/2020 01/12/2019 09/01/2018 05/28/2018 10/28/2017  Falls in the past year? 0 0 1 0 No  Number falls in past yr: - 0 0 - -  Injury with Fall? - 0 0 - -  Follow up - - - - -     Objective:  Melissa Schneider seemed alert and oriented and she participated appropriately during our telephone visit.  Blood Pressure Weight BMI  BP Readings from Last 3 Encounters:  09/29/19 126/80  08/17/19 107/62  07/28/19 140/78   Wt Readings from Last 3 Encounters:  09/29/19 205 lb (93 kg)  07/28/19 206 lb (93.4 kg)  06/30/19 190 lb (86.2 kg)   BMI Readings from Last 1 Encounters:  09/29/19 37.49 kg/m    *Unable to obtain current vital signs, weight, and BMI due to telephone visit type  Hearing/Vision  . Melissa Schneider did  seem to have difficulty with hearing/understanding during the telephone conversation . Reports that she has had a formal eye exam by an eye care professional within the past year . Reports that she has had a formal hearing evaluation within the past year *Unable to fully assess hearing and vision during telephone visit type  Cognitive Function: 6CIT Screen 01/19/2020 01/12/2019  What Year? 0 points 0 points  What month? 0 points 0 points  What time? 0 points 0 points  Count back from 20 2 points 0 points  Months in reverse 0 points 0 points  Repeat phrase 0 points 0 points  Total Score 2 0   (Normal:0-7, Significant for Dysfunction: >8)  Normal Cognitive Function Screening: Yes   Immunization & Health Maintenance Record Immunization History  Administered Date(s) Administered  . Influenza Whole 05/16/2010  . Influenza,inj,Quad PF,6+ Mos  05/10/2015, 04/26/2016, 06/19/2017, 05/28/2018  . Pneumococcal Polysaccharide-23 07/16/2008, 01/30/2016  . Td 07/17/2007  . Tdap 07/28/2019    Health Maintenance  Topic Date Due  . COVID-19 Vaccine (1) Never done  . HIV Screening  Never done  . COLONOSCOPY  01/09/2017  .  HEMOGLOBIN A1C  01/25/2020  . INFLUENZA VACCINE  02/14/2020  . PAP SMEAR-Modifier  04/28/2020  . FOOT EXAM  07/27/2020  . OPHTHALMOLOGY EXAM  01/18/2021  . MAMMOGRAM  08/25/2021  . TETANUS/TDAP  07/27/2029  . PNEUMOCOCCAL POLYSACCHARIDE VACCINE AGE 37-64 HIGH RISK  Completed  . Hepatitis C Screening  Completed       Assessment  This is a routine wellness examination for Melissa Schneider.  Health Maintenance: Due or Overdue Health Maintenance Due  Topic Date Due  . COVID-19 Vaccine (1) Never done  . HIV Screening  Never done  . COLONOSCOPY  01/09/2017    Melissa Schneider does not need a referral for Community Assistance: Care Management:   no Social Work:    no Prescription Assistance:  no Nutrition/Diabetes Education:  no   Plan:  Personalized Goals Goals Addressed            This Visit's Progress   . Exercise 3x per week (30 min per time)   On track    Try to exercise for at least 30 minutes, 3 times weekly      Personalized Health Maintenance & Screening Recommendations    Lung Cancer Screening Recommended: no (Low Dose CT Chest recommended if Age 5-80 years, 30 pack-year currently smoking OR have quit w/in past 15 years) Hepatitis C Screening recommended: no HIV Screening recommended: no  Advanced Directives: Written information was not prepared per patient's request.  Referrals & Orders No orders of the defined types were placed in this encounter.   Follow-up Plan . Follow-up with Janora Norlander, DO as planned . Schedule 10/26/2020    I have personally reviewed and noted the following in the patient's chart:   . Medical and social history . Use of alcohol, tobacco  or illicit drugs  . Current medications and supplements . Functional ability and status . Nutritional status . Physical activity . Advanced directives . List of other physicians . Hospitalizations, surgeries, and ER visits in previous 12 months . Vitals . Screenings to include cognitive, depression, and falls . Referrals and appointments  In addition, I have reviewed and discussed with Melissa Schneider certain preventive protocols, quality metrics, and best practice recommendations. A written personalized care plan for preventive services as well as general preventive health recommendations is available and can be mailed to the patient at her request.      Maud Deed [facility]  07/16/5724

## 2020-01-19 NOTE — Patient Instructions (Signed)
  [place] Maintenance Summary and Written Plan of Care  [name] ,  Thank you for allowing me to perform your Medicare Annual Wellness Visit and for your ongoing commitment to your health.   Health Maintenance & Immunization History Health Maintenance  Topic Date Due  . OPHTHALMOLOGY EXAM  Never done  . COVID-19 Vaccine (1) Never done  . HIV Screening  Never done  . COLONOSCOPY  01/09/2017  . HEMOGLOBIN A1C  01/25/2020  . INFLUENZA VACCINE  02/14/2020  . PAP SMEAR-Modifier  04/28/2020  . FOOT EXAM  07/27/2020  . MAMMOGRAM  08/25/2021  . TETANUS/TDAP  07/27/2029  . PNEUMOCOCCAL POLYSACCHARIDE VACCINE AGE 92-64 HIGH RISK  Completed  . Hepatitis C Screening  Completed   Immunization History  Administered Date(s) Administered  . Influenza Whole 05/16/2010  . Influenza,inj,Quad PF,6+ Mos 05/10/2015, 04/26/2016, 06/19/2017, 05/28/2018  . Pneumococcal Polysaccharide-23 07/16/2008, 01/30/2016  . Td 07/17/2007  . Tdap 07/28/2019    These are the patient goals that we discussed: Goals Addressed   None      This is a list of Health Maintenance Items that are overdue or due now: Health Maintenance Due  Topic Date Due  . OPHTHALMOLOGY EXAM  Never done  . COVID-19 Vaccine (1) Never done  . HIV Screening  Never done  . COLONOSCOPY  01/09/2017     Orders/Referrals Placed Today: No orders of the defined types were placed in this encounter.  (Contact our referral department at [phone] if you have not spoken with someone about your referral appointment within the next 5 days)    Follow-up Plan  Scheduled with Dr. Lajuana Ripple 10/26/2020 at 10:15am

## 2020-01-22 ENCOUNTER — Ambulatory Visit (INDEPENDENT_AMBULATORY_CARE_PROVIDER_SITE_OTHER): Payer: PPO | Admitting: Nurse Practitioner

## 2020-01-22 ENCOUNTER — Other Ambulatory Visit: Payer: Self-pay

## 2020-01-22 ENCOUNTER — Encounter: Payer: Self-pay | Admitting: Nurse Practitioner

## 2020-01-22 VITALS — BP 134/78 | HR 69 | Temp 98.4°F | Ht 62.0 in | Wt 203.8 lb

## 2020-01-22 DIAGNOSIS — E1169 Type 2 diabetes mellitus with other specified complication: Secondary | ICD-10-CM | POA: Diagnosis not present

## 2020-01-22 DIAGNOSIS — H6123 Impacted cerumen, bilateral: Secondary | ICD-10-CM | POA: Diagnosis not present

## 2020-01-22 DIAGNOSIS — E785 Hyperlipidemia, unspecified: Secondary | ICD-10-CM

## 2020-01-22 DIAGNOSIS — E039 Hypothyroidism, unspecified: Secondary | ICD-10-CM | POA: Diagnosis not present

## 2020-01-22 DIAGNOSIS — IMO0002 Reserved for concepts with insufficient information to code with codable children: Secondary | ICD-10-CM

## 2020-01-22 DIAGNOSIS — Z794 Long term (current) use of insulin: Secondary | ICD-10-CM

## 2020-01-22 DIAGNOSIS — E1165 Type 2 diabetes mellitus with hyperglycemia: Secondary | ICD-10-CM | POA: Diagnosis not present

## 2020-01-22 DIAGNOSIS — E118 Type 2 diabetes mellitus with unspecified complications: Secondary | ICD-10-CM

## 2020-01-22 LAB — BAYER DCA HB A1C WAIVED: HB A1C (BAYER DCA - WAIVED): 9.6 % — ABNORMAL HIGH (ref <7.0)

## 2020-01-22 NOTE — Patient Instructions (Addendum)
Bilateral impacted cerumen Patient is a 64 year old female who presents to clinic with bilateral impacted cerumen.  Patient reports wax buildup started about 5 years ago when she started wearing hearing aids.  Patient was at her ear doctor's appointment and her doctor was unable to view her eardrums and asked patient to go to primary care clinic to get Cerumen de- impacted.  Patient is not reporting any headache or dizziness today.  On assessment left ear is worse than right but eardrums is slightly visible. Ear canal completely cleaned out. Provided education to patient on preventing cerumen buildup in the future, advised patient to use Debrox once or twice a week.  Printed handout given to patient. Patient knows to follow-up with worsening symptoms.   Earwax Buildup, Adult The ears produce a substance called earwax that helps keep bacteria out of the ear and protects the skin in the ear canal. Occasionally, earwax can build up in the ear and cause discomfort or hearing loss. What increases the risk? This condition is more likely to develop in people who:  Are female.  Are elderly.  Naturally produce more earwax.  Clean their ears often with cotton swabs.  Use earplugs often.  Use in-ear headphones often.  Wear hearing aids.  Have narrow ear canals.  Have earwax that is overly thick or sticky.  Have eczema.  Are dehydrated.  Have excess hair in the ear canal. What are the signs or symptoms? Symptoms of this condition include:  Reduced or muffled hearing.  A feeling of fullness in the ear or feeling that the ear is plugged.  Fluid coming from the ear.  Ear pain.  Ear itch.  Ringing in the ear.  Coughing.  An obvious piece of earwax that can be seen inside the ear canal. How is this diagnosed? This condition may be diagnosed based on:  Your symptoms.  Your medical history.  An ear exam. During the exam, your health care provider will look into your ear with an  instrument called an otoscope. You may have tests, including a hearing test. How is this treated? This condition may be treated by:  Using ear drops to soften the earwax.  Having the earwax removed by a health care provider. The health care provider may: ? Flush the ear with water. ? Use an instrument that has a loop on the end (curette). ? Use a suction device.  Surgery to remove the wax buildup. This may be done in severe cases. Follow these instructions at home:   Take over-the-counter and prescription medicines only as told by your health care provider.  Do not put any objects, including cotton swabs, into your ear. You can clean the opening of your ear canal with a washcloth or facial tissue.  Follow instructions from your health care provider about cleaning your ears. Do not over-clean your ears.  Drink enough fluid to keep your urine clear or pale yellow. This will help to thin the earwax.  Keep all follow-up visits as told by your health care provider. If earwax builds up in your ears often or if you use hearing aids, consider seeing your health care provider for routine, preventive ear cleanings. Ask your health care provider how often you should schedule your cleanings.  If you have hearing aids, clean them according to instructions from the manufacturer and your health care provider. Contact a health care provider if:  You have ear pain.  You develop a fever.  You have blood, pus, or other fluid  coming from your ear.  You have hearing loss.  You have ringing in your ears that does not go away.  Your symptoms do not improve with treatment.  You feel like the room is spinning (vertigo). Summary  Earwax can build up in the ear and cause discomfort or hearing loss.  The most common symptoms of this condition include reduced or muffled hearing and a feeling of fullness in the ear or feeling that the ear is plugged.  This condition may be diagnosed based on your  symptoms, your medical history, and an ear exam.  This condition may be treated by using ear drops to soften the earwax or by having the earwax removed by a health care provider.  Do not put any objects, including cotton swabs, into your ear. You can clean the opening of your ear canal with a washcloth or facial tissue. This information is not intended to replace advice given to you by your health care provider. Make sure you discuss any questions you have with your health care provider. Document Revised: 06/14/2017 Document Reviewed: 09/12/2016 Elsevier Patient Education  2020 [employer].

## 2020-01-22 NOTE — Addendum Note (Signed)
Addended by: Earlene Plater on: 01/22/2020 09:36 AM   Modules accepted: Orders

## 2020-01-22 NOTE — Progress Notes (Signed)
Acute Office Visit  Subjective:    Patient ID: Melissa Schneider, female    DOB: October 30, 1955, 64 y.o.   MRN: [id number]  Chief Complaint  Patient presents with  . Cerumen Impaction    wears hearing aids, hearing dr. looked yesterday, left ear worse than right    HPI Patient is in today for cerumen buildup.  Patient reports wax buildup started about 5 years ago when she started wearing hearing aids.  Patient was at her ear doctor's appointment, Dr. Was unable to see her eardrums and asked patient to go to her primary care clinic to get cerumen de- impacted.  Patient is not reporting any headache or dizziness.  Past Medical History:  Diagnosis Date  . Anemia 07/23/2011  . Anxiety   . Diabetes mellitus   . Diabetic gastroparesis ([place]) 07/23/2011   Suspected  . Headache(784.0)   . Hearing difficulty   . Hyperlipidemia   . Hypothyroidism   . Kidney stones   . Microcytic anemia 07/23/2011  . Thalassemia minor    ????? not really worked up per pt but daughter has it???    Past Surgical History:  Procedure Laterality Date  . COLONOSCOPY  2004   Dr. Jim Desanctis: small fissue and hemorrhoids  . COLONOSCOPY  12/2011   Dr. Paulita Fujita: normal  . CYSTOSCOPY W/ URETERAL STENT PLACEMENT Right 08/13/2013   Procedure: CYSTOSCOPY WITH RETROGRADE PYELOGRAM/URETERAL STENT PLACEMENT;  Surgeon: Marissa Nestle, MD;  Location: [facility];  Service: Urology;  Laterality: Right;  . EXTRACORPOREAL SHOCK WAVE LITHOTRIPSY Right 08/19/2013   Procedure: EXTRACORPOREAL SHOCK WAVE LITHOTRIPSY (ESWL) RIGHT URETERAL CALCULUS;  Surgeon: Marissa Nestle, MD;  Location: [facility];  Service: Urology;  Laterality: Right;  . WRIST SURGERY Right     Family History  Problem Relation Age of Onset  . Colon cancer Maternal Grandmother        age 29  . Colon polyps Mother   . CAD Brother   . Rheum arthritis Sister     Social History   Socioeconomic History  . Marital status: Married    Spouse name: [name]  . Number of  children: 1  . Years of education: Not on file  . Highest education level: Some college, no degree  Occupational History  . 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 Occupation: Retired  Tobacco Use  . Smoking status: Never Smoker  . Smokeless tobacco: Never Used  . Tobacco comment: Never smoked  Vaping Use  . Vaping Use: Never used  Substance and Sexual Activity  . Alcohol use: No    Alcohol/week: 0.0 standard drinks  . Drug use: No  . Sexual activity: Not Currently  Other Topics Concern  . Not on file  Social History Narrative  . Not on file   Social Determinants of Health   Financial Resource Strain:   . Difficulty of Paying Living Expenses:   Food Insecurity:   . Worried About Charity fundraiser in the Last Year:   . Arboriculturist in the Last Year:   Transportation Needs:   . Film/video editor (Medical):   Marland Kitchen Lack of Transportation (Non-Medical):   Physical Activity:   . Days of Exercise per Week:   . Minutes of Exercise per Session:   Stress:   . Feeling of Stress :   Social Connections:   . Frequency of Communication with Friends and Family:   . Frequency of Social Gatherings with  Friends and Family:   . Attends Religious Services:   . Active Member of Clubs or Organizations:   . Attends Archivist Meetings:   Marland Kitchen Marital Status:   Intimate Partner Violence:   . Fear of Current or Ex-Partner:   . Emotionally Abused:   Marland Kitchen Physically Abused:   . Sexually Abused:     Outpatient Medications Prior to Visit  Medication Sig Dispense Refill  . aspirin 81 MG tablet Take 81 mg by mouth daily.    Marland Kitchen atorvastatin (LIPITOR) 20 MG tablet Take 1 tablet (20 mg total) by mouth daily. 90 tablet 3  . docusate sodium (COLACE) 100 MG capsule Take 200 mg by mouth daily as needed.     Marland Kitchen glucose blood (GNP EASY TOUCH GLUCOSE TEST) test strip EVERY DAY 100 each 11  . Insulin Pen Needle (PEN NEEDLES) 32G X 4 MM MISC 1 each by Does not  apply route 4 (four) times daily. 100 each 12  . Insulin Syringe-Needle U-100 (TRUEPLUS INSULIN SYRINGE) 31G X 5/16" 1 ML MISC AT BEDTIME Dx E11.8 90 each 3  . LANTUS 100 UNIT/ML injection INJECT 0.22-0.3 ML SQ AT BEDTIME (22-30 UNITS TOTAL) 10 mL 2  . levothyroxine (SYNTHROID) 137 MCG tablet Take 1 tablet (137 mcg total) by mouth daily before breakfast. (Needs labwork) 30 tablet 0  . losartan (COZAAR) 25 MG tablet TAKE ONE (1) TABLET EACH DAY 90 tablet 1  . metFORMIN (GLUCOPHAGE) 500 MG tablet Take 2 tablets (1,000 mg total) by mouth daily with breakfast. (Patient taking differently: Take 1,000 mg by mouth daily with breakfast. ) 180 tablet 1  . Multiple Vitamin (MULTIVITAMIN WITH MINERALS) TABS tablet Take 1 tablet by mouth daily.    Marland Kitchen NOVOLOG FLEXPEN 100 UNIT/ML FlexPen INJECT 10-17 UNITS SQ 3 TIMES DAILY WITHMEALS 15 mL 0  . SUMAtriptan (IMITREX) 100 MG tablet TAKE 1 TABLET AT ONSET OF MIGRAINE HEADACHE MAY REPEAT ONCE IN 2 HOURS (MAX OF 2 TABLETS/24 HOURS) 9 tablet 2   No facility-administered medications prior to visit.    Allergies  Allergen Reactions  . Codeine Nausea Only and Other (See Comments)    NAUSEA AND CAUSES HER TO HAVE BAD DREAMS  . Gabapentin     Confusion   . Lisinopril     Coughing    Review of Systems  Constitutional: Negative.   HENT: Negative for ear discharge and ear pain.        Cerumen build up  Eyes: Negative.   Respiratory: Negative.   Cardiovascular: Negative.   Endocrine: Negative.   Genitourinary: Negative.   Skin: Negative for color change and rash.  Neurological: Negative for light-headedness and headaches.       Objective:    Physical Exam Constitutional:      Appearance: Normal appearance.  HENT:     Head: Normocephalic.     Right Ear: There is impacted cerumen.     Left Ear: There is impacted cerumen.     Mouth/Throat:     Mouth: Mucous membranes are moist.     Pharynx: Oropharynx is clear. No posterior oropharyngeal erythema.    Eyes:     Conjunctiva/sclera: Conjunctivae normal.  Cardiovascular:     Rate and Rhythm: Normal rate and regular rhythm.     Pulses: Normal pulses.     Heart sounds: Normal heart sounds.  Pulmonary:     Effort: Pulmonary effort is normal.     Breath sounds: Normal breath sounds.  Abdominal:  General: Bowel sounds are normal.  Musculoskeletal:     Cervical back: Neck supple.  Skin:    General: Skin is warm.     Findings: No rash.  Neurological:     Mental Status: She is alert and oriented to person, place, and time.     BP 134/78   Pulse 69   Temp 98.4 F (36.9 C) (Temporal)   Ht 5\' 2"  (1.575 m)   Wt 203 lb 12.8 oz (92.4 kg)   BMI 37.28 kg/m  Wt Readings from Last 3 Encounters:  09/29/19 205 lb (93 kg)  07/28/19 206 lb (93.4 kg)  06/30/19 190 lb (86.2 kg)    Health Maintenance Due  Topic Date Due  . COVID-19 Vaccine (1) Never done  . HIV Screening  Never done  . COLONOSCOPY  01/09/2017    There are no preventive care reminders to display for this patient.   Lab Results  Component Value Date   TSH 8.670 (H) 07/28/2019   Lab Results  Component Value Date   WBC 8.7 07/28/2019   HGB 12.8 07/28/2019   HCT 41.9 07/28/2019   MCV 68 (L) 07/28/2019   PLT 335 07/28/2019   Lab Results  Component Value Date   NA 142 07/28/2019   K 4.3 07/28/2019   CO2 26 07/28/2019   GLUCOSE 107 (H) 07/28/2019   BUN 16 07/28/2019   CREATININE 0.83 07/28/2019   BILITOT 0.2 04/03/2019   ALKPHOS 116 04/03/2019   AST 15 04/03/2019   ALT 13 04/03/2019   PROT 6.7 04/03/2019   ALBUMIN 4.4 04/03/2019   CALCIUM 9.9 07/28/2019   ANIONGAP 9 09/13/2015   Lab Results  Component Value Date   CHOL 260 (H) 05/28/2018   Lab Results  Component Value Date   HDL 87 05/28/2018   Lab Results  Component Value Date   LDLCALC 154 (H) 05/28/2018   Lab Results  Component Value Date   TRIG 97 05/28/2018   Lab Results  Component Value Date   CHOLHDL 3.0 05/28/2018   Lab  Results  Component Value Date   HGBA1C 8.7 (H) 07/28/2019       Assessment & Plan:   Problem List Items Addressed This Visit      Nervous and Auditory   Bilateral impacted cerumen - Primary    Patient is a 64 year old female who presents to clinic with bilateral impacted cerumen.  Patient reports wax buildup started about 5 years ago when she started wearing hearing aids.  Patient was at her ear doctor's appointment and her doctor was unable to view her eardrums and asked patient to go to primary care clinic to get Cerumen de- impacted.  Patient is not reporting any headache or dizziness today.  On assessment left ear is worse than right but eardrums is slightly visible. Ear canal completely cleaned out. Provided education to patient on preventing cerumen buildup in the future, advised patient to use Debrox once or twice every week.  Printed handout given to patient. Patient knows to follow-up with worsening symptoms.          No orders of the defined types were placed in this encounter.    Ivy Lynn, NP

## 2020-01-22 NOTE — Assessment & Plan Note (Addendum)
Patient is a 64 year old female who presents to clinic with bilateral impacted cerumen.  Patient reports wax buildup started about 5 years ago when she started wearing hearing aids.  Patient was at her ear doctor's appointment and her doctor was unable to view her eardrums and asked patient to go to primary care clinic to get Cerumen de- impacted.  Patient is not reporting any headache or dizziness today.  On assessment left ear is worse than right but eardrums is slightly visible. Ear canal completely cleaned out. Provided education to patient on preventing cerumen buildup in the future, advised patient to use Debrox once or twice every week.  Printed handout given to patient. Patient knows to follow-up with worsening symptoms.

## 2020-01-23 LAB — LIPID PANEL
Chol/HDL Ratio: 3.7 ratio (ref 0.0–4.4)
Cholesterol, Total: 230 mg/dL — ABNORMAL HIGH (ref 100–199)
HDL: 63 mg/dL (ref >39)
LDL Chol Calc (NIH): 149 mg/dL — ABNORMAL HIGH (ref 0–99)
Triglycerides: 105 mg/dL (ref 0–149)
VLDL Cholesterol Cal: 18 mg/dL (ref 5–40)

## 2020-01-23 LAB — THYROID PANEL WITH TSH
Free Thyroxine Index: 2.6 (ref 1.2–4.9)
T3 Uptake Ratio: 29 % (ref 24–39)
T4, Total: 9.1 ug/dL (ref 4.5–12.0)
TSH: 2.61 u[IU]/mL (ref 0.450–4.500)

## 2020-01-29 ENCOUNTER — Telehealth: Payer: Self-pay | Admitting: *Deleted

## 2020-01-29 MED ORDER — ATORVASTATIN CALCIUM 40 MG PO TABS
40.0000 mg | ORAL_TABLET | Freq: Every day | ORAL | 1 refills | Status: DC
Start: 2020-01-29 — End: 2020-03-25

## 2020-01-29 NOTE — Telephone Encounter (Signed)
Pt voiced understanding - updated medication list 

## 2020-01-29 NOTE — Telephone Encounter (Signed)
-----   Message from Arnoldo Lenis, MD sent at 01/29/2020  2:35 PM EDT ----- Cholesterol too high, would increase atorvastatin to 40mg  daily.   Zandra Abts MD

## 2020-02-03 ENCOUNTER — Other Ambulatory Visit: Payer: Self-pay | Admitting: Family Medicine

## 2020-02-03 DIAGNOSIS — E1165 Type 2 diabetes mellitus with hyperglycemia: Secondary | ICD-10-CM

## 2020-02-03 DIAGNOSIS — IMO0002 Reserved for concepts with insufficient information to code with codable children: Secondary | ICD-10-CM

## 2020-02-04 NOTE — Telephone Encounter (Signed)
Please call pt to set up an appt.  I do not see one scheduled with me

## 2020-02-04 NOTE — Telephone Encounter (Signed)
Pt just saw Melissa Schneider but they didn't discuss diabetes and she doesn't have future appt with you till 2022.  Ok to refill or does she ntbs with you?

## 2020-02-04 NOTE — Telephone Encounter (Signed)
Ok to send but needs to be seen.  Her BGs were NOT controlled.

## 2020-02-11 ENCOUNTER — Ambulatory Visit (INDEPENDENT_AMBULATORY_CARE_PROVIDER_SITE_OTHER): Payer: PPO | Admitting: *Deleted

## 2020-02-11 DIAGNOSIS — I152 Hypertension secondary to endocrine disorders: Secondary | ICD-10-CM

## 2020-02-11 DIAGNOSIS — E118 Type 2 diabetes mellitus with unspecified complications: Secondary | ICD-10-CM | POA: Diagnosis not present

## 2020-02-11 DIAGNOSIS — E785 Hyperlipidemia, unspecified: Secondary | ICD-10-CM | POA: Diagnosis not present

## 2020-02-11 DIAGNOSIS — I10 Essential (primary) hypertension: Secondary | ICD-10-CM | POA: Diagnosis not present

## 2020-02-11 DIAGNOSIS — Z794 Long term (current) use of insulin: Secondary | ICD-10-CM | POA: Diagnosis not present

## 2020-02-11 DIAGNOSIS — E1165 Type 2 diabetes mellitus with hyperglycemia: Secondary | ICD-10-CM

## 2020-02-11 DIAGNOSIS — E1169 Type 2 diabetes mellitus with other specified complication: Secondary | ICD-10-CM

## 2020-02-11 DIAGNOSIS — E1159 Type 2 diabetes mellitus with other circulatory complications: Secondary | ICD-10-CM | POA: Diagnosis not present

## 2020-02-11 DIAGNOSIS — IMO0002 Reserved for concepts with insufficient information to code with codable children: Secondary | ICD-10-CM

## 2020-02-11 NOTE — Chronic Care Management (AMB) (Signed)
Chronic Care Management   Initial Visit Note  02/11/2020 Name: Melissa Schneider MRN: [id number] DOB: 06-Feb-1956  Referred by: Janora Norlander, DO Reason for referral : No chief complaint on file.   Melissa Schneider is a 64 y.o. year old female who is a primary care patient of Janora Norlander, DO. The CCM team was consulted for assistance with chronic disease management and care coordination needs related to Diabetes, hypothyroidism, hearing loss, hyperlipidemia.  Review of patient status, including review of consultants reports, relevant laboratory and other test results, and collaboration with appropriate care team members and the patient's provider was performed as part of comprehensive patient evaluation and provision of chronic care management services.    Subjective: I talked with Melissa Schneider by telephone today regarding management of her chronic medical conditions. She would like assistance with medication costs, weight management, and would like to discuss CGM.   SDOH (Social Determinants of Health) assessments performed: Yes See Care Plan activities for detailed interventions related to SDOH    Objective: Outpatient Encounter Medications as of 02/11/2020  Medication Sig  . aspirin 81 MG tablet Take 81 mg by mouth daily.  Marland Kitchen atorvastatin (LIPITOR) 40 MG tablet Take 1 tablet (40 mg total) by mouth daily.  Marland Kitchen docusate sodium (COLACE) 100 MG capsule Take 200 mg by mouth daily as needed.   Marland Kitchen glucose blood (GNP EASY TOUCH GLUCOSE TEST) test strip EVERY DAY  . Insulin Pen Needle (PEN NEEDLES) 32G X 4 MM MISC 1 each by Does not apply route 4 (four) times daily.  . Insulin Syringe-Needle U-100 (TRUEPLUS INSULIN SYRINGE) 31G X 5/16" 1 ML MISC AT BEDTIME Dx E11.8  . LANTUS 100 UNIT/ML injection INJECT 0.22-0.3 ML SQ AT BEDTIME (22-30 UNITS TOTAL)  . levothyroxine (SYNTHROID) 137 MCG tablet Take 1 tablet (137 mcg total) by mouth daily before breakfast. (Needs labwork)  . losartan  (COZAAR) 25 MG tablet TAKE ONE (1) TABLET EACH DAY  . metFORMIN (GLUCOPHAGE) 500 MG tablet Take 2 tablets (1,000 mg total) by mouth daily with breakfast. (Patient taking differently: Take 1,000 mg by mouth daily with breakfast. )  . Multiple Vitamin (MULTIVITAMIN WITH MINERALS) TABS tablet Take 1 tablet by mouth daily.  Marland Kitchen NOVOLOG FLEXPEN 100 UNIT/ML FlexPen INJECT 10-17 UNITS SQ 3 TIMES DAILY WITHMEALS  . SUMAtriptan (IMITREX) 100 MG tablet TAKE 1 TABLET AT ONSET OF MIGRAINE HEADACHE MAY REPEAT ONCE IN 2 HOURS (MAX OF 2 TABLETS/24 HOURS)   No facility-administered encounter medications on file as of 02/11/2020.     Lab Results  Component Value Date   HGBA1C 9.6 (H) 01/22/2020   HGBA1C 8.7 (H) 07/28/2019   HGBA1C 9.9 (H) 04/03/2019   Lab Results  Component Value Date   MICROALBUR neg 03/17/2015   LDLCALC 149 (H) 01/22/2020   CREATININE 0.83 07/28/2019   BP Readings from Last 3 Encounters:  01/22/20 134/78  09/29/19 126/80  08/17/19 107/62   RN Care Plan           This Visit's Progress   . Chronic Disease Management Needs       CARE PLAN ENTRY (see longtitudinal plan of care for additional care plan information)  Current Barriers:  . Chronic Disease Management support, education, and care coordination needs related to Diabetes, hypothyroidism, hearing loss, hyperlipidemia  Clinical Goal(s) related to Diabetes, hypothyroidism, hearing loss, hyperlipidemia:  Over the next 45 days, patient will:  . Work with the care management team to address educational, disease management, and care  coordination needs  . Begin or continue self health monitoring activities as directed today Measure and record cbg (blood glucose) 3-4 times daily . Call provider office for new or worsened signs and symptoms Blood glucose findings outside established parameters . Call care management team with questions or concerns . Verbalize basic understanding of patient centered plan of care established  today  Interventions related to Diabetes, hypothyroidism, hearing loss, hyperlipidemia:  . Evaluation of current treatment plans and patient's adherence to plan as established by provider . Assessed patient understanding of disease states . Assessed patient's education and care coordination needs . Provided disease specific education to patient  . Collaborated with appropriate clinical care team members regarding patient needs . Discussed diet and exercise . Discussed ability to perform ADLs . Discussed Continuous glucose monitor . Collaborated with [facility] front office staff to have them contact patient and schedule a visit with Lottie Dawson, [facility] to review CGM and order if appropriate . Encouraged patient to talk with Lottie Dawson, [facility] about diabetes management and weight management  . Reviewed and discussed medications . Discussed medication costs . Discussed Medicare Extra Help Program . Will mail patient information on Medicare Extra Help and how to apply . Discussed that prescription assistance through insulin manufacturer may be an option . Provided with RN CM contact information and encouraged to reach out as needed  Patient Self Care Activities related to Diabetes, hypothyroidism, hearing loss, hyperlipidemia:  . Patient is able to perform ADLs and IADLs independently . Patient would like assistance with managing chronic medical conditions  Initial goal documentation        Plan:  Telephone follow up appointment with care management team member scheduled for: RN Care Manager 03/24/20 at 10:30  Chong Sicilian, BSN, RN-BC [place] [facility] [place] Management Direct Dial: [phone]

## 2020-02-11 NOTE — Patient Instructions (Addendum)
Visit Information  Goals Addressed            This Visit's Progress   . Chronic Disease Management Needs       CARE PLAN ENTRY (see longtitudinal plan of care for additional care plan information)  Current Barriers:  . Chronic Disease Management support, education, and care coordination needs related to Diabetes, hypothyroidism, hearing loss, hyperlipidemia  Clinical Goal(s) related to Diabetes, hypothyroidism, hearing loss, hyperlipidemia:  Over the next 45 days, patient will:  . Work with the care management team to address educational, disease management, and care coordination needs  . Begin or continue self health monitoring activities as directed today Measure and record cbg (blood glucose) 3-4 times daily . Call provider office for new or worsened signs and symptoms Blood glucose findings outside established parameters . Call care management team with questions or concerns . Verbalize basic understanding of patient centered plan of care established today  Interventions related to Diabetes, hypothyroidism, hearing loss, hyperlipidemia:  . Evaluation of current treatment plans and patient's adherence to plan as established by provider . Assessed patient understanding of disease states . Assessed patient's education and care coordination needs . Provided disease specific education to patient  . Collaborated with appropriate clinical care team members regarding patient needs . Discussed diet and exercise . Discussed ability to perform ADLs . Discussed Continuous glucose monitor . Collaborated with [facility] front office staff to have them contact patient and schedule a visit with Lottie Dawson, [facility] to review CGM and order if appropriate . Encouraged patient to talk with Lottie Dawson, [facility] about diabetes management and weight management  . Reviewed and discussed medications . Discussed medication costs . Discussed Medicare Extra Help Program . Will mail patient information on Medicare  Extra Help and how to apply . Discussed that prescription assistance through insulin manufacturer may be an option . Provided with RN CM contact information and encouraged to reach out as needed  Patient Self Care Activities related to Diabetes, hypothyroidism, hearing loss, hyperlipidemia:  . Patient is able to perform ADLs and IADLs independently . Patient would like assistance with managing chronic medical conditions  Initial goal documentation        Ms. Mione was given information about Chronic Care Management services today including:  1. CCM service includes personalized support from designated clinical staff supervised by her physician, including individualized plan of care and coordination with other care providers 2. 24/7 contact phone numbers for assistance for urgent and routine care needs. 3. Service will only be billed when office clinical staff spend 20 minutes or more in a month to coordinate care. 4. Only one practitioner may furnish and bill the service in a calendar month. 5. The patient may stop CCM services at any time (effective at the end of the month) by phone call to the office staff. 6. The patient will be responsible for cost sharing (co-pay) of up to 20% of the service fee (after annual deductible is met).  Patient agreed to services and verbal consent obtained.   The patient verbalized understanding of instructions provided today and agreed to receive a mailed copy of patient instruction and/or educational materials.  Telephone follow up appointment with care management team member scheduled for: RN Care Manager 03/24/20 at 10:30 Appt scheduled with Clinical Pharmacist, Lottie Dawson, [facility], for 02/15/20 at 1:30.  Chong Sicilian, BSN, RN-BC Embedded Chronic Care Manager [facility] [place] Management Direct Dial: [phone]

## 2020-02-15 ENCOUNTER — Ambulatory Visit (INDEPENDENT_AMBULATORY_CARE_PROVIDER_SITE_OTHER): Payer: PPO | Admitting: Pharmacist

## 2020-02-15 ENCOUNTER — Other Ambulatory Visit: Payer: Self-pay

## 2020-02-15 DIAGNOSIS — E1122 Type 2 diabetes mellitus with diabetic chronic kidney disease: Secondary | ICD-10-CM

## 2020-02-15 DIAGNOSIS — N183 Chronic kidney disease, stage 3 unspecified: Secondary | ICD-10-CM | POA: Diagnosis not present

## 2020-02-16 NOTE — Progress Notes (Signed)
      02/16/2020 Name: Melissa Schneider MRN: [id number] DOB: 1956-05-18   S:  16 yoF presents for diabetes evaluation, education, and management Patient was referred and last seen by Primary Care Provider on 01/22/20. Patient is also enrolled in CCM services.  Insurance coverage/medication affordability: [employer]  Patient reports adherence with medications. . Current diabetes medications include: Lantus, Humalog, metformin . Current hypertension medications include: losartan Goal 130/80 . Current hyperlipidemia medications include: atorvastatin   Patient denies hypoglycemic events.   Patient reported dietary habits: Eats 2-3 meals/day Discussed meal planning options and Plate method for healthy eating . Avoid sugary drinks and desserts . Incorporate balanced protein, non starchy veggies, 1 serving of carbohydrate with each meal . Increase water intake . Increase physical activity as able  Patient-reported exercise habits: working around the house, will increase physical activity/walking  O:  Lab Results  Component Value Date   HGBA1C 9.6 (H) 01/22/2020   Lipid Panel     Component Value Date/Time   CHOL 230 (H) 01/22/2020 0936   TRIG 105 01/22/2020 0936   HDL 63 01/22/2020 0936   CHOLHDL 3.7 01/22/2020 0936   LDLCALC 149 (H) 01/22/2020 0936    Home fasting blood sugars: will apply libre  2 hour post-meal/random blood sugars: n/a.     A/P:  Diabetes T2DM currently uncontrolled. Patient is able to verbalize appropriate hypoglycemia management plan. Patient is adherent with medication. Control is suboptimal due to missed insulin doses.  -DECREASE to Lantus 25 units at bedtime  -Continue Humalog with meals (10-12 units each meal)  -STARTED GLP-1 Trulicity (generic name dulaglutide) 0.75mg  sq weekly.  -patient denies h/o thyroid cancer  -patient given sample (2-week supply)  -will increase dose as tolerated over the next 4-6 weeks  -will work to  decrease insulin  -Extensively discussed pathophysiology of diabetes, recommended lifestyle interventions, dietary effects on blood sugar control  -Counseled on s/sx of and management of hypoglycemia  -Next A1C anticipated 04/2020.  Written patient instructions provided.  Total time in face to face counseling 25 minutes.   Follow up Pharmacist Clinic Visit in 2 weeks   Regina Eck, PharmD, BCPS Clinical Pharmacist, [place]  II Phone [phone]

## 2020-02-29 ENCOUNTER — Other Ambulatory Visit: Payer: Self-pay

## 2020-02-29 ENCOUNTER — Ambulatory Visit (INDEPENDENT_AMBULATORY_CARE_PROVIDER_SITE_OTHER): Payer: PPO | Admitting: Pharmacist

## 2020-02-29 DIAGNOSIS — E1122 Type 2 diabetes mellitus with diabetic chronic kidney disease: Secondary | ICD-10-CM

## 2020-02-29 DIAGNOSIS — N183 Chronic kidney disease, stage 3 unspecified: Secondary | ICD-10-CM

## 2020-02-29 MED ORDER — TRULICITY 0.75 MG/0.5ML ~~LOC~~ SOAJ
0.7500 mg | SUBCUTANEOUS | 0 refills | Status: DC
Start: 2020-02-29 — End: 2020-03-07

## 2020-02-29 MED ORDER — DEXCOM G6 TRANSMITTER MISC: 4 refills | Status: DC

## 2020-02-29 MED ORDER — DEXCOM G6 RECEIVER DEVI: 0 refills | Status: DC

## 2020-02-29 MED ORDER — SUMATRIPTAN SUCCINATE 100 MG PO TABS: ORAL_TABLET | ORAL | 2 refills | Status: DC

## 2020-02-29 MED ORDER — DEXCOM G6 SENSOR MISC: 11 refills | Status: DC

## 2020-02-29 NOTE — Progress Notes (Signed)
    02/29/2020 Name: Melissa Schneider MRN: [id number] DOB: Oct 24, 1955   S:  7 yoF presents for diabetes evaluation, education, and management Patient was referred and last seen by Primary Care Provider on 01/22/20. Patient is also enrolled in CCM services.  Her libre device has fallen off of her arm.  Insurance coverage/medication affordability: [employer]  Patient reports adherence with medications.  Current diabetes medications include: Lantus, Humalog, trulicity, metformin  Current hypertension medications include: losartan Goal 130/80  Current hyperlipidemia medications include: atorvastatin   Patient denies hypoglycemic events.   Patient reported dietary habits: Eats 2-3 meals/day Discussed meal planning options and Plate method for healthy eating  Avoid sugary drinks and desserts  Incorporate balanced protein, non starchy veggies, 1 serving of carbohydrate with each meal  Increase water intake  Increase physical activity as able  Patient-reported exercise habits: working around the house, will increase physical activity/walking    O:  Lab Results  Component Value Date   HGBA1C 9.6 (H) 01/22/2020    Lipid Panel     Component Value Date/Time   CHOL 230 (H) 01/22/2020 0936   TRIG 105 01/22/2020 0936   HDL 63 01/22/2020 0936   CHOLHDL 3.7 01/22/2020 0936   LDLCALC 149 (H) 01/22/2020 0936   Libre data:  avg last 7 days: 139 avg last 14 days: 164 avg last 30 days: 162 avg last 90: 160   Home fasting blood sugars: varies depending on if she forgets lantus as night   A/P:  Diabetes T2DM currently uncontrolled. Patient is somewhat adherent with medication. Control is suboptimal due to missed insulin doses/lifestyle.  -CONTINUE Lantus 25 units-->ENCOURAGED PATIENT TO MOVE INSULIN TO MORNING TIME SINCE NIGHTTIME DOSES ARE BEING MISSED  -Continue Humalog with meals (10-12 units each meal)  -CONTINUE GLP-1 Trulicity (generic name dulaglutide)  0.75mg  sq weekly.             -patient denies h/o thyroid cancer             -patient given 4 WEEK voucher to use at pharmacy--rx sent to PCP to cosign             -will increase dose as tolerated over the next 4-6 weeks             -will work to decrease insulin  -Patient would like to try dexcom at mail order [place] --sent to [facility] another [place] sample today on right arm  -Extensively discussed pathophysiology of diabetes, recommended lifestyle interventions, dietary effects on blood sugar control  -Counseled on s/sx of and management of hypoglycemia  -Next A1C anticipated 04/2020.  Written patient instructions provided.  Total time in face to face counseling 30 minutes.   Follow up Pharmacist Clinic Visit in 4 weeks.    Regina Eck, PharmD, BCPS Clinical Pharmacist, [place]  II Phone [phone]

## 2020-03-02 ENCOUNTER — Telehealth: Payer: Self-pay | Admitting: Pharmacist

## 2020-03-02 NOTE — Telephone Encounter (Signed)
Call placed to patient to inform her of full LIS/extra help Copays should be $9-for 3 month brand name RXs $3-for 3 month generic

## 2020-03-03 ENCOUNTER — Ambulatory Visit: Payer: PPO | Admitting: Cardiology

## 2020-03-03 ENCOUNTER — Encounter: Payer: Self-pay | Admitting: Cardiology

## 2020-03-03 VITALS — BP 134/86 | HR 79 | Ht 62.0 in | Wt 200.8 lb

## 2020-03-03 DIAGNOSIS — I10 Essential (primary) hypertension: Secondary | ICD-10-CM | POA: Diagnosis not present

## 2020-03-03 DIAGNOSIS — R079 Chest pain, unspecified: Secondary | ICD-10-CM | POA: Diagnosis not present

## 2020-03-03 DIAGNOSIS — E782 Mixed hyperlipidemia: Secondary | ICD-10-CM

## 2020-03-03 NOTE — Progress Notes (Signed)
Clinical Summary Melissa Schneider is a 64 y.o.female seen today for follow up of the following medical problems.   1. Chest pain - started off and on x 10 years ago. Squeezing like feeling left sided, 4-5/10. Often occurs at rest. No other associated symptoms. Not positional, but can feel better with deep breaths. Lasts up to 1 hour. Occurs approx once a week. No relation to food. Can have some dysphagia at times that is separate. Has had some increase in frequency, stable severity - can get some DOE, example walking up stairs which is new, though she does report 40 lbs weight gain over the last year. - no LE edema, no orthopnea, no PND.  CAD risk factors: DM2, HL, brother MI age 5.    - 01/2017 nuclear stress no ischemia.  08/2019 coronary CTA: minimal CAD, coronary CA score 10, no significant disease.  -no significant pains since last visit.   2. HTN -she is compliant with meds  3. Hyperlipidemia - compliant with statin. Labs followed by pcp, needs repeat  - in 01/2020 we increased atorva to 40mg  daily - compliant with statin    SH: getting ready to move Past Medical History:  Diagnosis Date  . Anemia 07/23/2011  . Anxiety   . Diabetes mellitus   . Diabetic gastroparesis ([place]) 07/23/2011   Suspected  . Headache(784.0)   . Hearing difficulty   . Hyperlipidemia   . Hypothyroidism   . Kidney stones   . Microcytic anemia 07/23/2011  . Thalassemia minor    ????? not really worked up per pt but daughter has it???     Allergies  Allergen Reactions  . Codeine Nausea Only and Other (See Comments)    NAUSEA AND CAUSES HER TO HAVE BAD DREAMS  . Gabapentin     Confusion   . Lisinopril     Coughing     Current Outpatient Medications  Medication Sig Dispense Refill  . aspirin 81 MG tablet Take 81 mg by mouth daily.    Marland Kitchen atorvastatin (LIPITOR) 40 MG tablet Take 1 tablet (40 mg total) by mouth daily. 90 tablet 1  . Continuous Blood Gluc Receiver (DEXCOM G6  RECEIVER) DEVI Use to test blood sugar up to 6 times daily. DX: E11.65 1 each 0  . Continuous Blood Gluc Sensor (DEXCOM G6 SENSOR) MISC Use to test blood sugar up to 6 times daily. DX: E11.65 9 each 11  . Continuous Blood Gluc Transmit (DEXCOM G6 TRANSMITTER) MISC Use to test blood sugar up to 6 times daily. DX: E11.65 1 each 4  . docusate sodium (COLACE) 100 MG capsule Take 200 mg by mouth daily as needed.     . Dulaglutide (TRULICITY) 2.02 RK/2.7CW SOPN Inject 0.5 mLs (0.75 mg total) into the skin once a week. 2 mL 0  . glucose blood (GNP EASY TOUCH GLUCOSE TEST) test strip EVERY DAY 100 each 11  . Insulin Pen Needle (PEN NEEDLES) 32G X 4 MM MISC 1 each by Does not apply route 4 (four) times daily. 100 each 12  . Insulin Syringe-Needle U-100 (TRUEPLUS INSULIN SYRINGE) 31G X 5/16" 1 ML MISC AT BEDTIME Dx E11.8 90 each 3  . LANTUS 100 UNIT/ML injection INJECT 0.22-0.3 ML SQ AT BEDTIME (22-30 UNITS TOTAL) 10 mL 2  . levothyroxine (SYNTHROID) 137 MCG tablet Take 1 tablet (137 mcg total) by mouth daily before breakfast. (Needs labwork) 30 tablet 0  . losartan (COZAAR) 25 MG tablet TAKE ONE (1) TABLET EACH  DAY 90 tablet 1  . metFORMIN (GLUCOPHAGE) 500 MG tablet Take 2 tablets (1,000 mg total) by mouth daily with breakfast. (Patient taking differently: Take 1,000 mg by mouth daily with breakfast. ) 180 tablet 1  . Multiple Vitamin (MULTIVITAMIN WITH MINERALS) TABS tablet Take 1 tablet by mouth daily.    Marland Kitchen NOVOLOG FLEXPEN 100 UNIT/ML FlexPen INJECT 10-17 UNITS SQ 3 TIMES DAILY WITHMEALS 15 mL 0  . SUMAtriptan (IMITREX) 100 MG tablet May repeat in 2 hours if headache persists or recurs. 9 tablet 2   No current facility-administered medications for this visit.     Past Surgical History:  Procedure Laterality Date  . COLONOSCOPY  2004   Dr. Jim Desanctis: small fissue and hemorrhoids  . COLONOSCOPY  12/2011   Dr. Paulita Fujita: normal  . CYSTOSCOPY W/ URETERAL STENT PLACEMENT Right 08/13/2013   Procedure:  CYSTOSCOPY WITH RETROGRADE PYELOGRAM/URETERAL STENT PLACEMENT;  Surgeon: Marissa Nestle, MD;  Location: [facility];  Service: Urology;  Laterality: Right;  . EXTRACORPOREAL SHOCK WAVE LITHOTRIPSY Right 08/19/2013   Procedure: EXTRACORPOREAL SHOCK WAVE LITHOTRIPSY (ESWL) RIGHT URETERAL CALCULUS;  Surgeon: Marissa Nestle, MD;  Location: [facility];  Service: Urology;  Laterality: Right;  . WRIST SURGERY Right      Allergies  Allergen Reactions  . Codeine Nausea Only and Other (See Comments)    NAUSEA AND CAUSES HER TO HAVE BAD DREAMS  . Gabapentin     Confusion   . Lisinopril     Coughing      Family History  Problem Relation Age of Onset  . Colon cancer Maternal Grandmother        age 94  . Colon polyps Mother   . CAD Brother   . Rheum arthritis Sister      Social History Melissa Schneider reports that she has never smoked. She has never used smokeless tobacco. Melissa Schneider reports no history of alcohol use.   Review of Systems CONSTITUTIONAL: No weight loss, fever, chills, weakness or fatigue.  HEENT: Eyes: No visual loss, blurred vision, double vision or yellow sclerae.No hearing loss, sneezing, congestion, runny nose or sore throat.  SKIN: No rash or itching.  CARDIOVASCULAR: per hpi RESPIRATORY: No shortness of breath, cough or sputum.  GASTROINTESTINAL: No anorexia, nausea, vomiting or diarrhea. No abdominal pain or blood.  GENITOURINARY: No burning on urination, no polyuria NEUROLOGICAL: No headache, dizziness, syncope, paralysis, ataxia, numbness or tingling in the extremities. No change in bowel or bladder control.  MUSCULOSKELETAL: No muscle, back pain, joint pain or stiffness.  LYMPHATICS: No enlarged nodes. No history of splenectomy.  PSYCHIATRIC: No history of depression or anxiety.  ENDOCRINOLOGIC: No reports of sweating, cold or heat intolerance. No polyuria or polydipsia.  Marland Kitchen   Physical Examination Today's Vitals   03/03/20 0930  BP: 134/86  Pulse: 79  SpO2:  97%  Weight: 200 lb 12.8 oz (91.1 kg)  Height: 5\' 2"  (1.575 m)   Body mass index is 36.73 kg/m.  Gen: resting comfortably, no acute distress HEENT: no scleral icterus, pupils equal round and reactive, no palptable cervical adenopathy,  CV: RRR, 2/6 systolic murmur rusb, no jvd Resp: Clear to auscultation bilaterally GI: abdomen is soft, non-tender, non-distended, normal bowel sounds, no hepatosplenomegaly MSK: extremities are warm, no edema.  Skin: warm, no rash Neuro:  no focal deficits Psych: appropriate affect   Diagnostic Studies 08/2015 GXT  There was no ST segment deviation noted during stress.  No T wave inversion was noted during stress.  Motion artifact No definite ST changes to suggest ischemia   Finalized by Fay Records, MD on Mon Jan 23, 2016 3:32 PM   There was no ST segment deviation noted during stress.  No T wave inversion was noted during stress.  Pressure rate product is 20, 701. Test at marginal workload For heart rate achieved, there were no ischemic EKG changes Conclusion limited.     08/2015 echo Study Conclusions  - Left ventricle: The cavity size was normal. Wall thickness was at the upper limits of normal. Systolic function was normal. The estimated ejection fraction was in the range of 60% to 65%. Wall motion was normal; there were no regional wall motion abnormalities. Left ventricular diastolic function parameters were normal. - Ascending aorta: The ascending aorta was mildly ectatic. - Mitral valve: There was trivial regurgitation. - Right atrium: Central venous pressure (est): 3 mm Hg. - Tricuspid valve: There was trivial regurgitation. - Pulmonary arteries: PA peak pressure: 23 mm Hg (S). - Pericardium, extracardiac: There was no pericardial effusion.  Impressions:  - Upper normal LV wall thickness with LVEF 60-65% and grossly normal diastolic function. Mildly ectatic ascending aorta. Trivial mitral and  tricuspid regurgitation. Estimated PASP 23 mmHg.   01/2017 nuclear stress  No diagnostic ST segment changes to indicate ischemia. Low risk Duke treadmill score 4.5.  Blood pressure demonstrated a hypertensive response to exercise.  Small, mild intensity, fixed anteroapical defect consistent with breast attenuation.  This is a low risk study.  Nuclear stress EF: 76%.   08/2019 coronary CTA IMPRESSION: 1. Minimal CAD, CADRADS = 1.  2. The patient's coronary artery calcium score is 10, which places the patient in the 67th percentile for age and sex matched controls.  3. Normal coronary origin with right dominance.  Assessment and Plan  1. Chest pain -negative cardiac CT for significant disease, her symptoms are not cardiac - no change in chronic symtpoms, continue to monitor.   2. HTN - essentialyl at goal, continue current meds   3. Hyperlipidemia -continue statin, recent increase in atorvastatin to 40mg  daily - repeat lipids next visit if not done prior by pcp     Arnoldo Lenis, M.D.

## 2020-03-03 NOTE — Patient Instructions (Signed)

## 2020-03-04 ENCOUNTER — Other Ambulatory Visit: Payer: Self-pay | Admitting: Family Medicine

## 2020-03-04 ENCOUNTER — Ambulatory Visit: Payer: PPO | Admitting: Cardiology

## 2020-03-24 ENCOUNTER — Telehealth: Payer: PPO | Admitting: *Deleted

## 2020-03-25 ENCOUNTER — Ambulatory Visit (INDEPENDENT_AMBULATORY_CARE_PROVIDER_SITE_OTHER): Payer: PPO | Admitting: Pharmacist

## 2020-03-25 ENCOUNTER — Telehealth: Payer: Self-pay | Admitting: Family Medicine

## 2020-03-25 DIAGNOSIS — E119 Type 2 diabetes mellitus without complications: Secondary | ICD-10-CM

## 2020-03-25 MED ORDER — FREESTYLE LIBRE 2 SENSOR MISC: 11 refills | Status: DC

## 2020-03-25 MED ORDER — ATORVASTATIN CALCIUM 40 MG PO TABS: 40.0000 mg | ORAL_TABLET | Freq: Every day | ORAL | 1 refills | Status: DC

## 2020-03-25 MED ORDER — TRULICITY 1.5 MG/0.5ML ~~LOC~~ SOAJ
1.5000 mg | SUBCUTANEOUS | 1 refills | Status: DC
Start: 2020-03-25 — End: 2020-08-26

## 2020-03-25 MED ORDER — FREESTYLE LIBRE 2 READER DEVI: 0 refills | Status: DC

## 2020-03-25 NOTE — Telephone Encounter (Signed)
Libre 2 CGM called in to the drug store per patient request

## 2020-03-25 NOTE — Progress Notes (Signed)
° ° ° °  03/25/20 Name: Melissa Schneider MRN: [id number] DOB: 08/25/1955   S:64 yoF presents for diabetes evaluation, education, and management Patient was referred and last seen by Primary Care Provider on7/9/21.Of note, patient is also enrolled in CCM services. She remains interested in CGM system to easily monitor her blood sugars, however there have been insurance challenges. She is willing to try [place] CGM system again since her insurance states this is covered locally at her drug store.    O:  Lab Results  Component Value Date   HGBA1C 9.6 (H) 01/22/2020    There were no vitals filed for this visit.     Lipid Panel     Component Value Date/Time   CHOL 230 (H) 01/22/2020 0936   TRIG 105 01/22/2020 0936   HDL 63 01/22/2020 0936   CHOLHDL 3.7 01/22/2020 0936   LDLCALC 149 (H) 01/22/2020 0936   Readings from last visit (has been without CGM due to insurance) avg last 7 days: 139 avg last 14 days: 164 avg last 30 days: 162 avg last 90: 160  Home fasting blood sugars: varies depending on if she forgets lantus at night (counseled patient to move basal insulin to AM if she is forgetting nighttime dose   A/P: Diabetes T2DM currently uncontrolled. Patient is able to verbalize appropriate hypoglycemia management plan. Patient is adherent with medication. Control is suboptimal due to missed insulin doses.  -DECREASE to Lantus 18 units at bedtime  -DECREASE Humalog with meals (5-7 units each meal)  -INCREASE GLP-1 Trulicity (generic name dulaglutide) 1.5mg  sq weekly.             -patient denies h/o thyroid/medullary cancer             -will work to decrease insulin  -Extensively discussed pathophysiology of diabetes, recommended lifestyle interventions, dietary effects on blood sugar control  -Counseled on s/sx of and management of hypoglycemia  -Next A1C anticipated    Total time in counseling 15 minutes.   Follow up Pharmacist/PCP Clinic Visit ON 10/2020.    Regina Eck, PharmD, BCPS Clinical Pharmacist, [facility]  [facility] Phone [phone]

## 2020-03-25 NOTE — Telephone Encounter (Signed)
  Prescription Request  03/25/2020  What is the name of the medication or equipment? Pt wants libre freestyle called in because she can get it for free. She said she spoke with Almyra Free this morning  Have you contacted your pharmacy to request a refill? (if applicable) no  Which [facility] you [facility]   Patient notified that their request is being sent to the clinical staff for review and that they should receive a response within 2 business days.

## 2020-03-28 ENCOUNTER — Telehealth: Payer: Self-pay | Admitting: Pharmacist

## 2020-04-01 ENCOUNTER — Other Ambulatory Visit: Payer: Self-pay | Admitting: Family Medicine

## 2020-04-01 DIAGNOSIS — IMO0002 Reserved for concepts with insufficient information to code with codable children: Secondary | ICD-10-CM

## 2020-04-01 DIAGNOSIS — E1165 Type 2 diabetes mellitus with hyperglycemia: Secondary | ICD-10-CM

## 2020-05-02 ENCOUNTER — Telehealth: Payer: PPO | Admitting: *Deleted

## 2020-05-02 ENCOUNTER — Telehealth: Payer: Self-pay | Admitting: *Deleted

## 2020-05-02 NOTE — Telephone Encounter (Signed)
  Chronic Care Management   Outreach Note  05/02/2020 Name: Melissa Schneider MRN: [id number] DOB: 01/06/1956  Referred by: Janora Norlander, DO Reason for referral : Chronic Care Management (RN follow up)   An unsuccessful telephone follow-up was attempted today. The patient was referred to the case management team for assistance with care management and care coordination.   Follow Up Plan: A HIPAA compliant phone message was left for the patient providing contact information and requesting a return call.  The care management team will reach out to the patient again over the next 30 days.   Chong Sicilian, BSN, RN-BC Embedded Chronic Care Manager [facility] [facility] Direct Dial: [phone]

## 2020-05-20 ENCOUNTER — Other Ambulatory Visit: Payer: Self-pay | Admitting: Family Medicine

## 2020-05-20 DIAGNOSIS — E1165 Type 2 diabetes mellitus with hyperglycemia: Secondary | ICD-10-CM

## 2020-05-20 DIAGNOSIS — IMO0002 Reserved for concepts with insufficient information to code with codable children: Secondary | ICD-10-CM

## 2020-06-06 ENCOUNTER — Ambulatory Visit: Payer: PPO | Admitting: Pharmacist

## 2020-06-06 ENCOUNTER — Ambulatory Visit: Payer: PPO | Admitting: *Deleted

## 2020-06-06 DIAGNOSIS — E1159 Type 2 diabetes mellitus with other circulatory complications: Secondary | ICD-10-CM

## 2020-06-06 DIAGNOSIS — E119 Type 2 diabetes mellitus without complications: Secondary | ICD-10-CM

## 2020-06-06 DIAGNOSIS — I152 Hypertension secondary to endocrine disorders: Secondary | ICD-10-CM

## 2020-06-06 NOTE — Chronic Care Management (AMB) (Signed)
Chronic Care Management   Follow Up Note   06/06/2020 Name: Melissa Schneider MRN: [id number] DOB: 12/06/1955  Referred by: Janora Norlander, DO Reason for referral : Chronic Care Management (RN follow up)   Melissa Schneider is a 64 y.o. year old female who is a primary care patient of Janora Norlander, DO. The CCM team was consulted for assistance with chronic disease management and care coordination needs.    Review of patient status, including review of consultants reports, relevant laboratory and other test results, and collaboration with appropriate care team members and the patient's provider was performed as part of comprehensive patient evaluation and provision of chronic care management services.    SDOH (Social Determinants of Health) assessments performed: No See Care Plan activities for detailed interventions related to [facility])     Outpatient Encounter Medications as of 06/06/2020  Medication Sig  . aspirin 81 MG tablet Take 81 mg by mouth daily.  Marland Kitchen atorvastatin (LIPITOR) 40 MG tablet Take 1 tablet (40 mg total) by mouth daily.  . Continuous Blood Gluc Receiver (FREESTYLE LIBRE 2 READER) DEVI Use to test blood sugar up to 6 times daily. DX: E11.9  . Continuous Blood Gluc Sensor (FREESTYLE LIBRE 2 SENSOR) MISC Use to test blood sugar up to 6 times daily. DX: E11.9  . docusate sodium (COLACE) 100 MG capsule Take 200 mg by mouth daily as needed.   . Dulaglutide (TRULICITY) 1.5 MA/0.0KH SOPN Inject 1.5 mg into the skin once a week. Patient requests 90 day supply  . glucose blood (GNP EASY TOUCH GLUCOSE TEST) test strip EVERY DAY  . Insulin Pen Needle (PEN NEEDLES) 32G X 4 MM MISC 1 each by Does not apply route 4 (four) times daily.  . Insulin Syringe-Needle U-100 (TRUEPLUS INSULIN SYRINGE) 31G X 5/16" 1 ML MISC AT BEDTIME Dx E11.8  . LANTUS 100 UNIT/ML injection INJECT 0.22-0.3 ML SQ AT BEDTIME (22-30 UNITS TOTAL)  . levothyroxine (SYNTHROID) 137 MCG tablet Take 1 tablet  (137 mcg total) by mouth daily before breakfast. (Needs labwork)  . losartan (COZAAR) 25 MG tablet TAKE ONE (1) TABLET EACH DAY  . metFORMIN (GLUCOPHAGE) 500 MG tablet Take 2 tablets (1,000 mg total) by mouth daily with breakfast. (Patient taking differently: Take 1,000 mg by mouth daily with breakfast. )  . Multiple Vitamin (MULTIVITAMIN WITH MINERALS) TABS tablet Take 1 tablet by mouth daily.  Marland Kitchen NOVOLOG FLEXPEN 100 UNIT/ML FlexPen INJECT 10-17 UNITS SQ 3 TIMES DAILY WITHMEALS  . SUMAtriptan (IMITREX) 100 MG tablet May repeat in 2 hours if headache persists or recurs.   No facility-administered encounter medications on file as of 06/06/2020.     Objective:   Goals Addressed            This Visit's Progress   . Monitor and Manage My Blood Sugar       - check blood sugar at prescribed times - check blood sugar if I feel it is too high or too low - enter blood sugar readings and medication or insulin into daily log - take the blood sugar log to all doctor visits - take the blood sugar meter to all doctor visits         Patient Care Plan: Diabetes Type 2 (Adult)    Problem Identified: Glycemic Management (Diabetes, Type 2)   Priority: High    Goal: Glycemic Management Optimized   Note:   Evidence-based guidance:  Anticipate A1C testing (point-of-care) every 3 to 6 months based  on goal attainment.  Review mutually-set A1C goal or target range.  Anticipate use of antihyperglycemic with or without insulin and periodic adjustments; consider active involvement of pharmacist.  Provide medical nutrition therapy and development of individualized eating.  Compare self-reported symptoms of hypo or hyperglycemia to blood glucose levels, diet and fluid intake, current medications, psychosocial and physiologic stressors, change in activity and barriers to care adherence.  Promote self-monitoring of blood glucose levels.  Assess and address barriers to management plan, such as food  insecurity, age, developmental ability, depression, anxiety, fear of hypoglycemia or weight gain, as well as medication cost, side effects and complicated regimen.  Consider referral to community-based diabetes education program, visiting nurse, community health worker or health coach.  Encourage regular dental care for treatment of periodontal disease; refer to dental provider when needed.   Notes:   Long-Range Goal: Monitor and Manage My Blood Sugar   This Visit's Progress: On track  Note:   CARE PLAN ENTRY (see longtitudinal plan of care for additional care plan information)  Objective:  Lab Results  Component Value Date   HGBA1C 9.6 (H) 01/22/2020 .   Lab Results  Component Value Date   CREATININE 0.83 07/28/2019   CREATININE 0.87 04/03/2019   CREATININE 0.93 05/28/2018 .   Marland Kitchen No results found for: EGFR  Current Barriers:  Marland Kitchen Knowledge Deficits related to medications used for management of diabetes . Technical problems with CGM  Case Manager Clinical Goal(s):  Over the next 60 days, patient will demonstrate improved adherence to prescribed treatment plan for diabetes self care/management as evidenced by:  . daily monitoring and recording of CBG  . adherence to ADA/ carb modified diet . adherence to prescribed medication regimen  Interventions:  . Reviewed medications with patient and discussed importance of medication adherence . Discussed plans with patient for ongoing care management follow up and provided patient with direct contact information for care management team . Reviewed scheduled/upcoming provider appointments including: Lottie Dawson, PharmD on 06/13/20 and Dr Lajuana Ripple on 10/26/20 . Advised patient, providing education and rationale, to check cbg 4 times daily and record, calling [phone] for findings outside established parameters.   . Discussed that Freestyle Libre meter is not connecting with the sensors properly. Recommended she contact [employer]. She  prefers to wait and talk with Almyra Free at her visit on 06/13/20. Advised to bring meter and sensors to visit.  . Review of patient status, including review of consultants reports, relevant laboratory and other test results, and medications completed.  Patient Self Care Activities:  . Self administers oral medications as prescribed . Self administers insulin as prescribed . Attends all scheduled provider appointments . Checks blood sugars as prescribed and utilize hyper and hypoglycemia protocol as needed . - check blood sugar at prescribed times . - check blood sugar if I feel it is too high or too low . - enter blood sugar readings and medication or insulin into daily log . - take the blood sugar log to all doctor visits . - take the blood sugar meter to all doctor visits  .   Initial goal documentation    Problem Identified: Disease Progression (Diabetes, Type 2)     Goal: Disease Progression Prevented or Minimized   Note:   Evidence-based guidance:  Prepare patient for laboratory and diagnostic exams based on risk and presentation.  Encourage lifestyle changes, such as increased intake of plant-based foods, stress reduction, consistent physical activity and smoking cessation to prevent long-term complications and chronic disease.  Individualize activity and exercise recommendations while considering potential limitations, such as neuropathy, retinopathy or the ability to prevent hyperglycemia or hypoglycemia.   Prepare patient for use of pharmacologic therapy that may include antihypertensive, analgesic, prostaglandin E1 with periodic adjustments, based on presenting chronic condition and laboratory results.  Assess signs/symptoms and risk factors for hypertension, sleep-disordered breathing, neuropathy (including changes in gait and balance), retinopathy, nephropathy and sexual dysfunction.  Address pregnancy planning and contraceptive choice, especially when prescribing antihypertensive  or statin.  Ensure completion of annual comprehensive foot exam and dilated eye exam.   Implement additional individualized goals and interventions based on identified risk factors.  Prepare patient for consultation or referral for specialist care, such as ophthalmology, neurology, cardiology, podiatry, nephrology or perinatology.   Notes:     Plan:  The patient has been provided with contact information for the care management team and has been advised to call with any health related questions or concerns.  The care management team will reach out to the patient again over the next 45 days.    Chong Sicilian, BSN, RN-BC Embedded Chronic Care Manager [facility] [place] [place] [facility] Direct Dial: [phone]

## 2020-06-10 IMAGING — MG DIGITAL SCREENING BILAT W/ TOMO W/ CAD
6 of 10 series · 6 of 30 positions shown · non-contrast
Comparison: None.

CLINICAL DATA: Screening.

EXAM:
DIGITAL SCREENING BILATERAL MAMMOGRAM WITH TOMO AND CAD

[R CC synth-2D (1 of 2)]
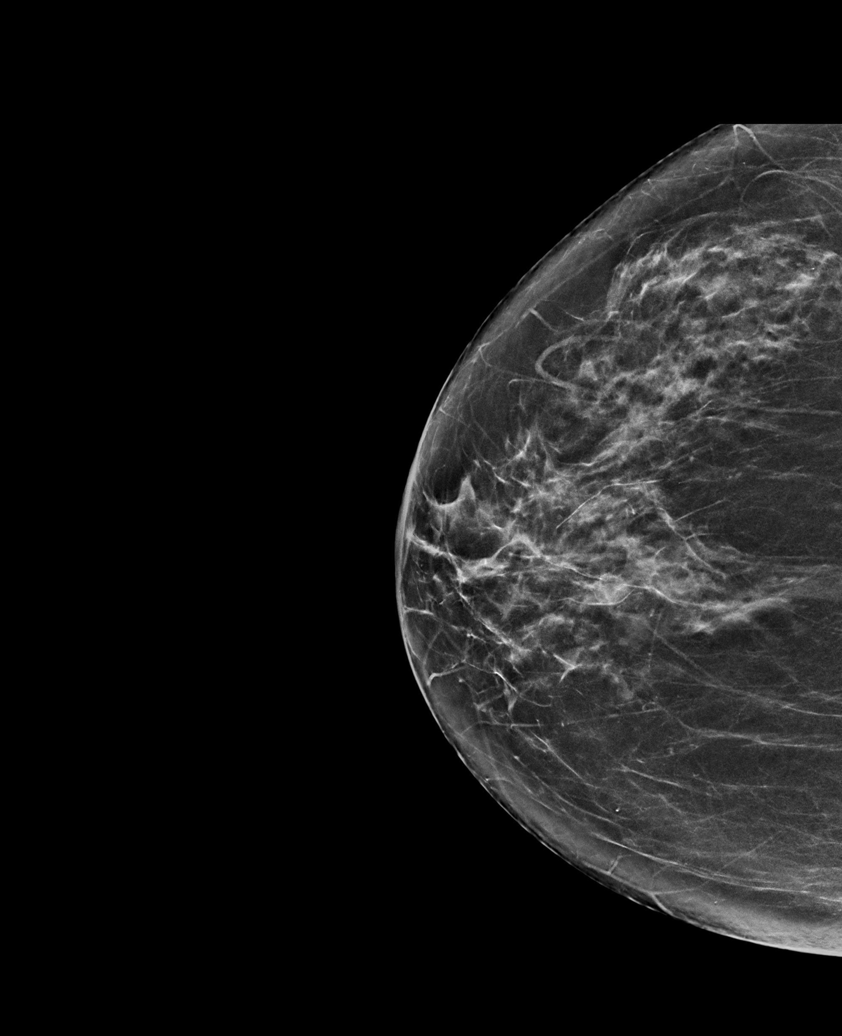

[R CC synth-2D (2 of 2)]
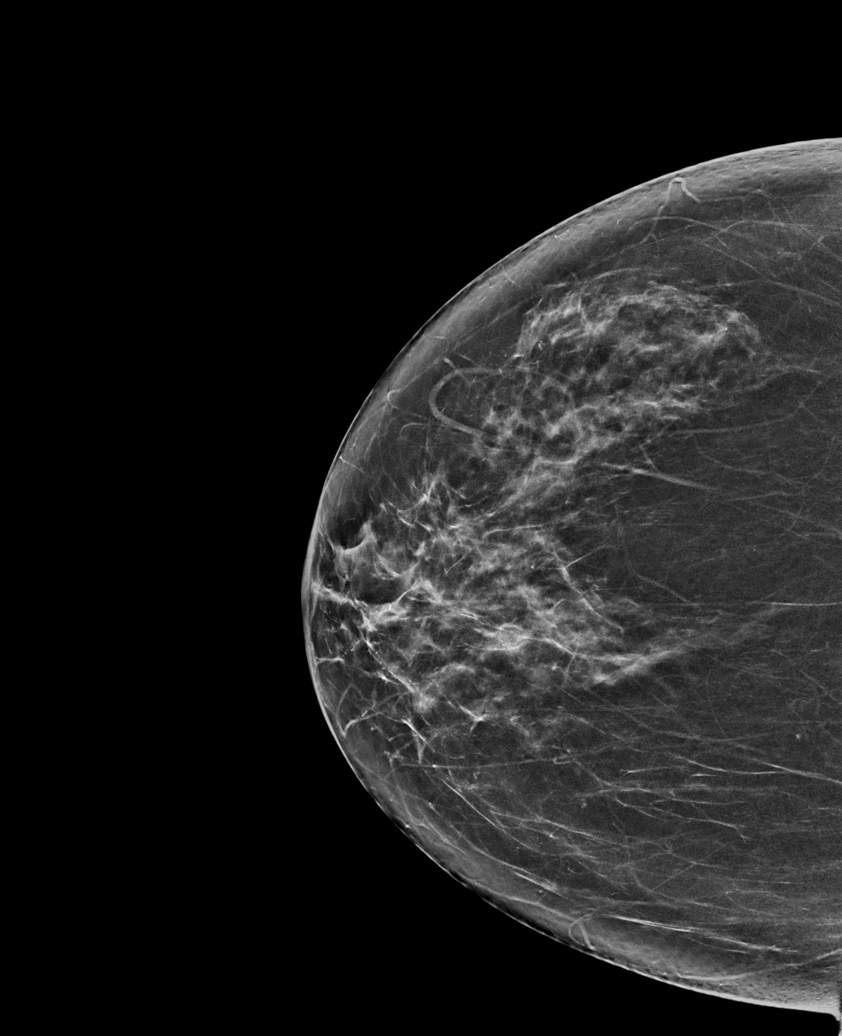

[L MLO synth-2D]
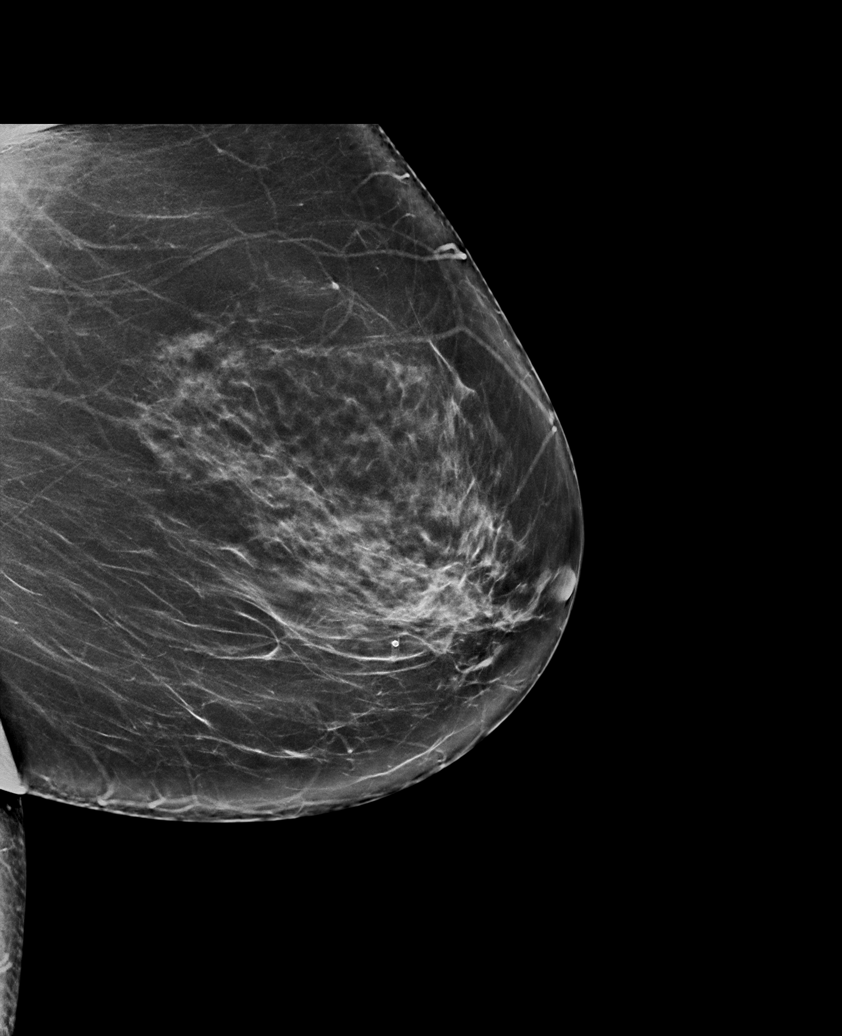

[R MLO synth-2D]
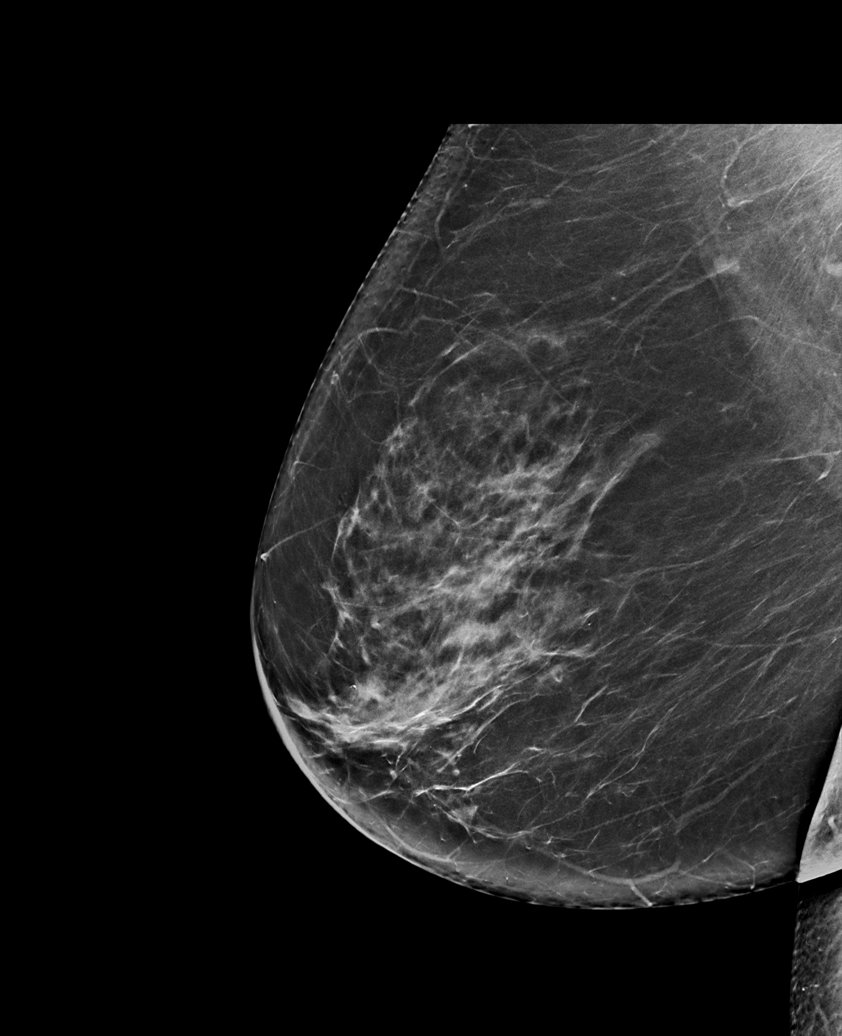

[L CC synth-2D]
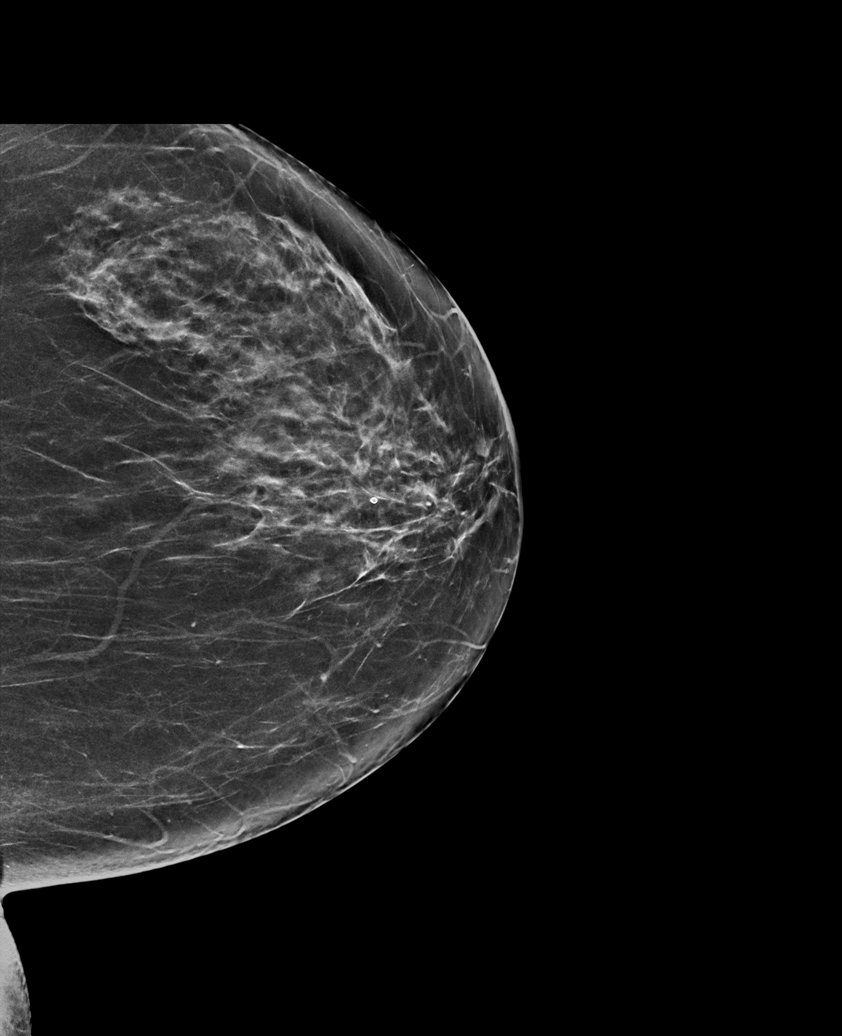

[R MLO tomo · tomo slice 41/80.0]
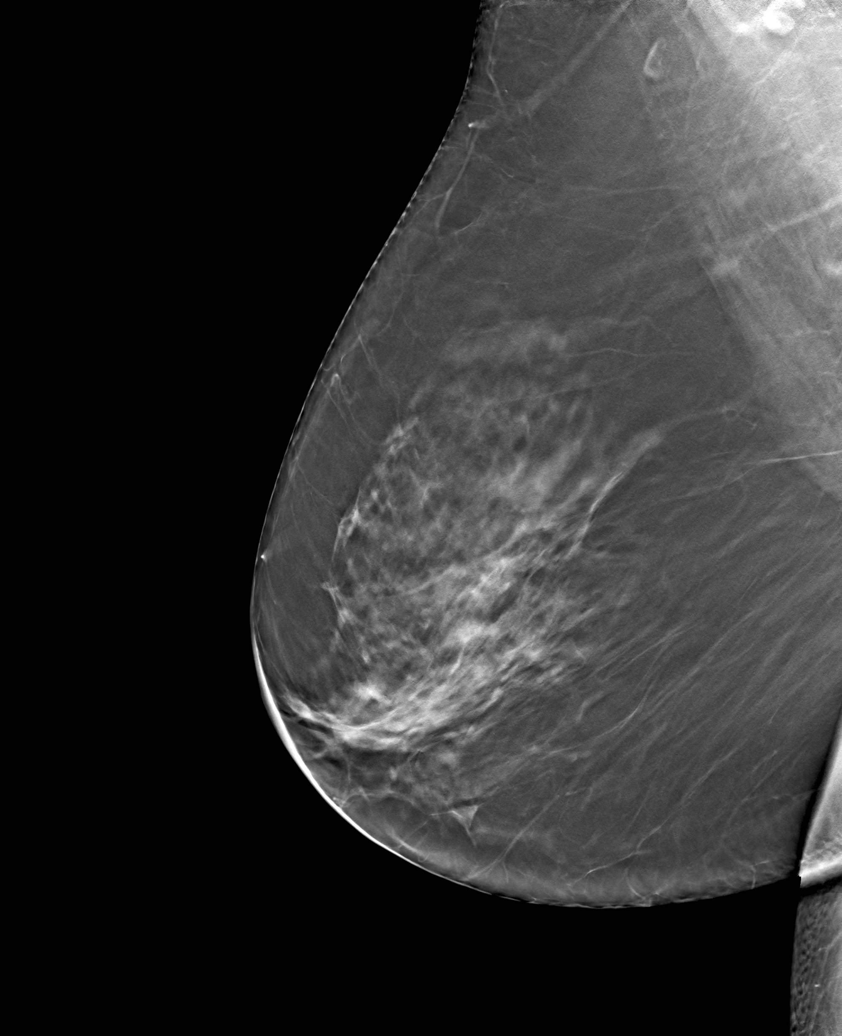

[6 of 30 positions shown; findings below may reference images not displayed]

ACR Breast Density Category b: There are scattered areas of
fibroglandular density.
FINDINGS: There are no findings suspicious for malignancy. Images were
processed with CAD.
IMPRESSION: No mammographic evidence of malignancy. A result letter of this
screening mammogram will be mailed directly to the patient.

RECOMMENDATION:
Screening mammogram in one year. (Code:[id number]-G-EJ6)

BI-RADS CATEGORY  1: Negative.

## 2020-06-13 ENCOUNTER — Ambulatory Visit (INDEPENDENT_AMBULATORY_CARE_PROVIDER_SITE_OTHER): Payer: PPO | Admitting: Pharmacist

## 2020-06-13 ENCOUNTER — Other Ambulatory Visit: Payer: Self-pay

## 2020-06-13 DIAGNOSIS — E119 Type 2 diabetes mellitus without complications: Secondary | ICD-10-CM

## 2020-06-13 NOTE — Progress Notes (Signed)
     06/13/2020 Name: Melissa Schneider MRN: [id number] DOB: 05/05/1956   S:  26 yoF Presents for diabetes evaluation, education, and management Patient was referred and last seen by Primary Care Provider on 03/25/20.  Patient has had difficulties with CGM Libre monitor.    Insurance coverage/medication affordability: HTA/full extra help  Patient reports adherence with medications. . Current diabetes medications include: trulicity, lantus, novolog . Current hypertension medications include: losartan Goal 130/80 . Current hyperlipidemia medications include: atorvastatin   Patient denies hypoglycemic events.     O:  Lab Results  Component Value Date   HGBA1C 9.6 (H) 01/22/2020    There were no vitals filed for this visit.     Lipid Panel     Component Value Date/Time   CHOL 230 (H) 01/22/2020 0936   TRIG 105 01/22/2020 0936   HDL 63 01/22/2020 0936   CHOLHDL 3.7 01/22/2020 0936   LDLCALC 149 (H) 01/22/2020 0936   Elenor Legato has malfunctioned, patient has not been testing as regularly   A/P:  Diabetes T2DM currently uncontrolled.  Patient is not always adherent with medication. Control is suboptimal due to diet/lifestly.  -New libre applied to right arm.  Encouraged patient to reach out to [employer]/libre support team to troubleshoot CGM if this continues to happen.  Encouraged patient to finger stick if she is not able to use Libre   Continue insulin and trulicity as prescribed   Consider Increase trulicity to 3mg  sq weekly  Patient self-discontinued metformin  Restart losartan   Restart atorvastatin  -Extensively discussed pathophysiology of diabetes, recommended lifestyle interventions, dietary effects on blood sugar control  -Counseled on s/sx of and management of hypoglycemia    Written patient instructions provided.  Total time in face to face counseling 30 minutes.   Follow up Pharmacist Clinic Visit ON 07/06/20.    Regina Eck, PharmD,  BCPS Clinical Pharmacist, [place]  II Phone [phone]

## 2020-06-14 ENCOUNTER — Telehealth: Payer: Self-pay

## 2020-06-14 NOTE — Telephone Encounter (Signed)
Pt returned call wanting to let Almyra Free know that the message on her sensor says "No active sensor. Start new sensor."

## 2020-06-14 NOTE — Telephone Encounter (Signed)
Just FYI if patient calls back:  Returned call to patient  No answer -->Left VM   I need to know what the error message says on the Elenor Legato Encouraged patient to call company [phone]

## 2020-06-15 MED ORDER — LOSARTAN POTASSIUM 25 MG PO TABS
ORAL_TABLET | ORAL | 1 refills | Status: DC
Start: 2020-06-15 — End: 2021-04-27

## 2020-06-15 NOTE — Telephone Encounter (Signed)
Call returned to patient She has contacted [place] support team who is assisting patient  She will let me know how I can help after speaking with them

## 2020-06-20 ENCOUNTER — Telehealth: Payer: Self-pay

## 2020-06-21 NOTE — Telephone Encounter (Signed)
-  Libre 2 -Inside the box was a Risk analyst 14 day CGM (called [employer]) product was packaged wrong at [employer] to send new replacement sensor -patient now wearing sensor -Encouraged her to call back with BGs readings in 2 weeks

## 2020-06-27 ENCOUNTER — Other Ambulatory Visit: Payer: Self-pay | Admitting: Family Medicine

## 2020-06-27 DIAGNOSIS — IMO0002 Reserved for concepts with insufficient information to code with codable children: Secondary | ICD-10-CM

## 2020-06-27 DIAGNOSIS — E1165 Type 2 diabetes mellitus with hyperglycemia: Secondary | ICD-10-CM

## 2020-07-06 ENCOUNTER — Telehealth: Payer: Self-pay

## 2020-07-06 ENCOUNTER — Telehealth: Payer: Self-pay | Admitting: Pharmacist

## 2020-07-06 NOTE — Telephone Encounter (Signed)
No answer, left VM encouraging patient to return call

## 2020-07-13 ENCOUNTER — Ambulatory Visit: Payer: Self-pay

## 2020-07-13 DIAGNOSIS — E119 Type 2 diabetes mellitus without complications: Secondary | ICD-10-CM

## 2020-07-13 NOTE — Progress Notes (Signed)
Chronic Care Management   Follow Up Note   07/13/2020 Name: JORIE ZEE MRN: [id number] DOB: September 05, 1955  Referred by: Raliegh Ip, DO Reason for referral : Diabetes    NEDA WILLENBRING is a 64 y.o. year old female who is a primary care patient of Raliegh Ip, DO. The CCM team was consulted for assistance with chronic disease management and care coordination needs.    Review of patient status, including review of consultants reports, relevant laboratory and other test results, and collaboration with appropriate care team members and the patient's provider was performed as part of comprehensive patient evaluation and provision of chronic care management services.    SDOH (Social Determinants of Health) assessments performed: No See Care Plan activities for detailed interventions related to [facility])     Outpatient Encounter Medications as of 07/13/2020  Medication Sig  . aspirin 81 MG tablet Take 81 mg by mouth daily.  Marland Kitchen atorvastatin (LIPITOR) 40 MG tablet Take 1 tablet (40 mg total) by mouth daily.  . Continuous Blood Gluc Receiver (FREESTYLE LIBRE 2 READER) DEVI Use to test blood sugar up to 6 times daily. DX: E11.9  . Continuous Blood Gluc Sensor (FREESTYLE LIBRE 2 SENSOR) MISC Use to test blood sugar up to 6 times daily. DX: E11.9  . docusate sodium (COLACE) 100 MG capsule Take 200 mg by mouth daily as needed.   . Dulaglutide (TRULICITY) 1.5 MG/0.5ML SOPN Inject 1.5 mg into the skin once a week. Patient requests 90 day supply  . glucose blood (GNP EASY TOUCH GLUCOSE TEST) test strip EVERY DAY  . insulin aspart (NOVOLOG FLEXPEN) 100 UNIT/ML FlexPen Inject 10-17 Units into the skin 3 (three) times daily with meals. (Needs to be seen before next refill)  . insulin glargine (LANTUS) 100 UNIT/ML injection Inject 0.22-0.3 mLs (22-30 Units total) into the skin at bedtime. (Needs to be seen before next refill)  . Insulin Pen Needle (PEN NEEDLES) 32G X 4 MM MISC 1 each by  Does not apply route 4 (four) times daily.  . Insulin Syringe-Needle U-100 (TRUEPLUS INSULIN SYRINGE) 31G X 5/16" 1 ML MISC AT BEDTIME Dx E11.8  . levothyroxine (SYNTHROID) 137 MCG tablet Take 1 tablet (137 mcg total) by mouth daily before breakfast. (Needs labwork)  . losartan (COZAAR) 25 MG tablet TAKE ONE (1) TABLET EACH DAY  . metFORMIN (GLUCOPHAGE) 500 MG tablet Take 2 tablets (1,000 mg total) by mouth daily with breakfast. (Patient not taking: Reported on 06/15/2020)  . Multiple Vitamin (MULTIVITAMIN WITH MINERALS) TABS tablet Take 1 tablet by mouth daily.  . SUMAtriptan (IMITREX) 100 MG tablet May repeat in 2 hours if headache persists or recurs.   No facility-administered encounter medications on file as of 07/13/2020.     Objective:   Goals Addressed              This Visit's Progress     Patient Stated   .  Monitor and Manage My Blood Sugar (pt-stated)        - check blood sugar at prescribed times - check blood sugar if I feel it is too high or too low - enter blood sugar readings and medication or insulin into daily log - take the blood sugar log to all doctor visits - take the blood sugar meter to all doctor visits   Patient using CGM technology to monitor blood sugars.  She is using [employer] 2 CGM system (via drug store in [place]).  She now has  the correct sensors and it is functioning properly.  BGs are much improved --last A1c 9.6%  Reminded patient of f/u A1c in April, but to call sooner if BGs worsen  Continue current medications as prescribed  Patient has FULL LIS/extra help with medications        Plan:   Telephone follow up appointment with care management team member scheduled for: next month   SIGNATURE  Kieth Brightly, PharmD, BCPS Clinical Pharmacist, [facility]  II Phone [phone]

## 2020-08-08 ENCOUNTER — Telehealth: Payer: Self-pay | Admitting: *Deleted

## 2020-08-08 ENCOUNTER — Telehealth: Payer: PPO | Admitting: *Deleted

## 2020-08-08 NOTE — Telephone Encounter (Signed)
  Chronic Care Management   Outreach Note  08/08/2020 Name: Melissa Schneider MRN: [id number] DOB: 1955/09/23  Referred by: Janora Norlander, DO Reason for referral : Chronic Care Management (RN follow up)   An unsuccessful follow-up Telephone Visit was attempted today. The patient was referred to the case management team for assistance with care management and care coordination.   Clinical Goals: . Over the next 30 days, patient will be contacted by a Care Guide to reschedule their CCM Visit  Interventions and Plan . Chart reviewed in preparation for telephone visit . Collaboration with other care team members as needed . A HIPAA compliant phone message was left for the patient providing contact information and requesting a return call.  . Request sent to care guides to reach out and reschedule patient's telephone visit   Chong Sicilian, BSN, RN-BC [place] / [place] Management Direct Dial: [phone]

## 2020-08-08 NOTE — Telephone Encounter (Signed)
R/s

## 2020-08-08 NOTE — Patient Instructions (Signed)
Visit Information  Goals Addressed              This Visit's Progress     Patient Stated   .  Monitor and Manage My Blood Sugar (pt-stated)        - check blood sugar at prescribed times - check blood sugar if I feel it is too high or too low - enter blood sugar readings and medication or insulin into daily log - take the blood sugar log to all doctor visits - take the blood sugar meter to all doctor visits   Patient using CGM technology to monitor blood sugars.  She is using [employer] 2 CGM system (via drug store in [place]).  She now has the correct sensors and it is functioning properly.  BGs are much improved --last A1c 9.6%  Reminded patient of f/u A1c in April, but to call sooner if BGs worsen  Continue current medications as prescribed  Patient has FULL LIS/extra help with medications       Patient verbalizes understanding of instructions provided today and agrees to view in [place].    Plan:  The patient has been provided with contact information for the care management team and has been advised to call with any health related questions or concerns.  The care management team will reach out to the patient again over the next 45 days.    Chong Sicilian, BSN, RN-BC Embedded Chronic Care Manager [facility] [place] [facility] Direct Dial: [phone]

## 2020-08-10 NOTE — Telephone Encounter (Signed)
Rescheduled 08/22/2020

## 2020-08-22 ENCOUNTER — Ambulatory Visit (INDEPENDENT_AMBULATORY_CARE_PROVIDER_SITE_OTHER): Payer: PPO | Admitting: *Deleted

## 2020-08-22 DIAGNOSIS — I152 Hypertension secondary to endocrine disorders: Secondary | ICD-10-CM

## 2020-08-22 DIAGNOSIS — E119 Type 2 diabetes mellitus without complications: Secondary | ICD-10-CM

## 2020-08-22 DIAGNOSIS — E1159 Type 2 diabetes mellitus with other circulatory complications: Secondary | ICD-10-CM | POA: Diagnosis not present

## 2020-08-22 NOTE — Chronic Care Management (AMB) (Signed)
Chronic Care Management   CCM RN Visit Note  08/22/2020 Name: Melissa Schneider MRN: [id number] DOB: 06-25-1956  Subjective: Melissa Schneider is a 65 y.o. year old female who is a primary care patient of Janora Norlander, DO. The care management team was consulted for assistance with disease management and care coordination needs.    Engaged with patient by telephone for follow up visit in response to provider referral for case management and/or care coordination services.   Consent to Services:  The patient was given information about Chronic Care Management services, agreed to services, and gave verbal consent prior to initiation of services.  Please see initial visit note for detailed documentation.   Patient agreed to services and verbal consent obtained.   Assessment: Review of patient past medical history, allergies, medications, health status, including review of consultants reports, laboratory and other test data, was performed as part of comprehensive evaluation and provision of chronic care management services.   SDOH (Social Determinants of Health) assessments and interventions performed:  yes  CCM Care Plan  Allergies  Allergen Reactions  . Codeine Nausea Only and Other (See Comments)    NAUSEA AND CAUSES HER TO HAVE BAD DREAMS  . Gabapentin     Confusion   . Lisinopril     Coughing    Outpatient Encounter Medications as of 08/22/2020  Medication Sig  . aspirin 81 MG tablet Take 81 mg by mouth daily.  Marland Kitchen atorvastatin (LIPITOR) 40 MG tablet Take 1 tablet (40 mg total) by mouth daily.  . Continuous Blood Gluc Receiver (FREESTYLE LIBRE 2 READER) DEVI Use to test blood sugar up to 6 times daily. DX: E11.9  . Continuous Blood Gluc Sensor (FREESTYLE LIBRE 2 SENSOR) MISC Use to test blood sugar up to 6 times daily. DX: E11.9  . docusate sodium (COLACE) 100 MG capsule Take 200 mg by mouth daily as needed.   . Dulaglutide (TRULICITY) 1.5 IO/9.7DZ SOPN Inject 1.5 mg into the  skin once a week. Patient requests 90 day supply  . glucose blood (GNP EASY TOUCH GLUCOSE TEST) test strip EVERY DAY  . insulin aspart (NOVOLOG FLEXPEN) 100 UNIT/ML FlexPen Inject 10-17 Units into the skin 3 (three) times daily with meals. (Needs to be seen before next refill)  . insulin glargine (LANTUS) 100 UNIT/ML injection Inject 0.22-0.3 mLs (22-30 Units total) into the skin at bedtime. (Needs to be seen before next refill)  . Insulin Pen Needle (PEN NEEDLES) 32G X 4 MM MISC 1 each by Does not apply route 4 (four) times daily.  . Insulin Syringe-Needle U-100 (TRUEPLUS INSULIN SYRINGE) 31G X 5/16" 1 ML MISC AT BEDTIME Dx E11.8  . levothyroxine (SYNTHROID) 137 MCG tablet Take 1 tablet (137 mcg total) by mouth daily before breakfast. (Needs labwork)  . losartan (COZAAR) 25 MG tablet TAKE ONE (1) TABLET EACH DAY  . metFORMIN (GLUCOPHAGE) 500 MG tablet Take 2 tablets (1,000 mg total) by mouth daily with breakfast. (Patient not taking: Reported on 06/15/2020)  . Multiple Vitamin (MULTIVITAMIN WITH MINERALS) TABS tablet Take 1 tablet by mouth daily.  . SUMAtriptan (IMITREX) 100 MG tablet May repeat in 2 hours if headache persists or recurs.   No facility-administered encounter medications on file as of 08/22/2020.    Patient Active Problem List   Diagnosis Date Noted  . Bilateral impacted cerumen 01/22/2020  . Hyperlipidemia associated with type 2 diabetes mellitus ([place]) 09/01/2018  . Depressed mood 09/01/2018  . Low back pain 04/26/2016  . Hearing  problem of both ears 01/30/2016  . Thyroid activity decreased 11/07/2015  . Chest pain 08/11/2015  . BMI 35.0-35.9,adult 08/11/2015  . Hyperlipidemia 06/16/2015  . Migraine 03/17/2015  . Diabetic neuropathy (HCC) 03/17/2015  . Constipation 04/05/2014  . Microcytic anemia 04/05/2014  . Recurrent obstructive pyelonephritis 08/14/2013  . Leukocytosis, unspecified 08/13/2013  . Obstructive uropathy 08/13/2013  . Stone in kidney 08/13/2013  .  Diabetic gastroparesis (HCC) 07/23/2011  . Hypothyroidism 08/24/2010  . DM (diabetes mellitus), type 2, uncontrolled (HCC) 08/24/2010    Conditions to be addressed/monitored:HTN, HLD and DMII  Care Plan : RNCM: Diabetes Type 2 (Adult)  Updates made by Gwenith Daily, RN since 08/22/2020 12:00 AM    Problem: Glycemic Management (Diabetes, Type 2)   Priority: Medium    Long-Range Goal: Monitor and Manage My Blood Sugar   Recent Progress: On track  Priority: Medium  Note:   Current Barriers:  . Chronic Disease Management support and education needs related to diabetes in a patient with hypertension and hyperlipidemia  Nurse Case Manager Clinical Goal(s):  Marland Kitchen Over the next 90 days, patient will work with Medical illustrator to address needs related to self-management of diabetes . Over the next 180 days, the patient will demonstrate ongoing self health care management ability as evidenced by checking blood sugar before meals and at bedtime and PRN and by calling PCP with any readings outside of recommended range.  Interventions:  . 1:1 collaboration with Raliegh Ip, DO regarding development and update of comprehensive plan of care as evidenced by provider attestation and co-signature . Inter-disciplinary care team collaboration (see longitudinal plan of care) . Evaluation of current treatment plan related to diabetes and patient's adherence to plan as established by provider. . Chart reviewed including relevant office notes and lab results . Reviewed and discussed medications and insulin injection schedule . Discussed continuous blood sugar monitoring o No problems with monitor or sensor . Discussed home blood sugar readings o 7 day average is 153 o 90 day average is 157 o 7 low glucose alerts over the past 7 days. Mostly during the night. o Several high glucose alerts o 272 at 814 this morning. Took 16 units of insulin. Did not eat. Dropped to 74 at 9:36 . Discussed  diet o Encouraged patient to make healthy meal choices and to eat at regular intervals o Recommended to eat something if she is going to take insulin . Reviewed upcoming appointment with Dr Nadine Counts on 10/26/20 . Asked patient to reach out to PCP with any readings outside of recommended range . Previously provided with RN Care Manager telephone number and encouraged to reach out as needed  Patient Goals/Self-Care Activities Over the next 90 days, patient will: . Continue to check blood sugar using CGM before meals and at bedtime and as needed if you feel that it is too high or too low . enter blood sugar readings and medication or insulin into daily log . take the blood sugar log to all doctor visits . take the blood sugar meter to all doctor visits  . Call PCP [phone] with any readings outside of recommended range . Make healthy meal choices . Eat meals at regular intervals  . Make sure to eat if you are going to take your insulin      Follow Up Plan:  Telephone follow up appointment with care management team member scheduled for: 11/30/20 with RN Care Manager The patient has been provided with contact information for the care  management team and has been advised to call with any health related questions or concerns.  Next PCP appointment scheduled for: 10/26/20 with Dr Lajuana Ripple Next cardiology appointment scheduled for 09/07/20  Chong Sicilian, BSN, RN-BC [place] / [place] Management Direct Dial: [phone]

## 2020-08-22 NOTE — Patient Instructions (Signed)
Visit Information  PATIENT GOALS: Goals Addressed              This Visit's Progress     Patient Stated   .  Monitor and Manage My Blood Sugar (pt-stated)   On track     Timeframe:  Long-Range Goal Priority:  Medium Start Date: 08/22/20                            Expected End Date: 02/18/21                      Follow-up 11/30/20  . Continue to check blood sugar using CGM before meals and at bedtime and as needed if you feel that it is too high or too low . enter blood sugar readings and medication or insulin into daily log . take the blood sugar log to all doctor visits . take the blood sugar meter to all doctor visits  . Call PCP [phone] with any readings outside of recommended range . Make healthy meal choices . Eat meals at regular intervals  . Make sure to eat if you are going to take your insulin        Patient verbalizes understanding of instructions provided today and agrees to view in [place].   Follow Up Plan:  Telephone follow up appointment with care management team member scheduled for: 11/30/20 with RN Care Manager The patient has been provided with contact information for the care management team and has been advised to call with any health related questions or concerns.  Next PCP appointment scheduled for: 10/26/20 with Dr Lajuana Ripple Next cardiology appointment scheduled for 09/07/20  Chong Sicilian, BSN, RN-BC [place] / [place] Management Direct Dial: [phone]

## 2020-08-25 ENCOUNTER — Other Ambulatory Visit: Payer: Self-pay | Admitting: Family Medicine

## 2020-08-25 DIAGNOSIS — IMO0002 Reserved for concepts with insufficient information to code with codable children: Secondary | ICD-10-CM

## 2020-08-25 DIAGNOSIS — E1165 Type 2 diabetes mellitus with hyperglycemia: Secondary | ICD-10-CM

## 2020-09-07 ENCOUNTER — Ambulatory Visit: Payer: PPO | Admitting: Cardiology

## 2020-09-07 NOTE — Progress Notes (Deleted)
Clinical Summary Ms. Nader is a 65 y.o.female seen today for follow up of the following medical problems.   1. Chest pain - started off and on x 10 years ago. Squeezing like feeling left sided, 4-5/10. Often occurs at rest. No other associated symptoms. Not positional, but can feel better with deep breaths. Lasts up to 1 hour. Occurs approx once a week. No relation to food. Can have some dysphagia at times that is separate. Has had some increase in frequency, stable severity - can get some DOE, example walking up stairs which is new, though she does report 40 lbs weight gain over the last year. - no LE edema, no orthopnea, no PND.  CAD risk factors: DM2, HL, brother MI age 74.    - 01/2017 nuclear stress no ischemia.  08/2019 coronary CTA: minimal CAD, coronary CA score 1, no significant disease. -no significant pains since last visit.   2. HTN -she is compliant with meds  3. Hyperlipidemia -compliant with statin. Labs followed by pcp, needs repeat  - in 01/2020 we increased atorva to 40mg  daily - compliant with statin    SH: getting ready to move   Past Medical History:  Diagnosis Date  . Anemia 07/23/2011  . Anxiety   . Diabetes mellitus   . Diabetic gastroparesis ([place]) 07/23/2011   Suspected  . Headache(784.0)   . Hearing difficulty   . Hyperlipidemia   . Hypothyroidism   . Kidney stones   . Microcytic anemia 07/23/2011  . Thalassemia minor    ????? not really worked up per pt but daughter has it???     Allergies  Allergen Reactions  . Codeine Nausea Only and Other (See Comments)    NAUSEA AND CAUSES HER TO HAVE BAD DREAMS  . Gabapentin     Confusion   . Lisinopril     Coughing     Current Outpatient Medications  Medication Sig Dispense Refill  . aspirin 81 MG tablet Take 81 mg by mouth daily.    Marland Kitchen atorvastatin (LIPITOR) 40 MG tablet Take 1 tablet (40 mg total) by mouth daily. 90 tablet 1  . Continuous Blood Gluc Receiver (FREESTYLE  LIBRE 2 READER) DEVI Use to test blood sugar up to 6 times daily. DX: E11.9 1 each 0  . Continuous Blood Gluc Sensor (FREESTYLE LIBRE 2 SENSOR) MISC Use to test blood sugar up to 6 times daily. DX: E11.9 2 each 11  . docusate sodium (COLACE) 100 MG capsule Take 200 mg by mouth daily as needed.     Marland Kitchen glucose blood (GNP EASY TOUCH GLUCOSE TEST) test strip EVERY DAY 100 each 11  . Insulin Pen Needle (PEN NEEDLES) 32G X 4 MM MISC 1 each by Does not apply route 4 (four) times daily. 100 each 12  . Insulin Syringe-Needle U-100 (TRUEPLUS INSULIN SYRINGE) 31G X 5/16" 1 ML MISC AT BEDTIME Dx E11.8 90 each 3  . LANTUS 100 UNIT/ML injection INJECT 0.22-0.3 ML SQ AT BEDTIME (22-30 UNITS TOTAL) 10 mL 0  . levothyroxine (SYNTHROID) 137 MCG tablet TAKE ONE TABLET EACH MORNING BEFORE BREAKFAST 30 tablet 5  . losartan (COZAAR) 25 MG tablet TAKE ONE (1) TABLET EACH DAY 90 tablet 1  . metFORMIN (GLUCOPHAGE) 500 MG tablet Take 2 tablets (1,000 mg total) by mouth daily with breakfast. (Patient not taking: Reported on 06/15/2020) 180 tablet 1  . Multiple Vitamin (MULTIVITAMIN WITH MINERALS) TABS tablet Take 1 tablet by mouth daily.    Marland Kitchen NOVOLOG  FLEXPEN 100 UNIT/ML FlexPen INJECT 10-17 UNITS SQ 3 TIMES DAILY WITHMEALS 15 mL 0  . SUMAtriptan (IMITREX) 100 MG tablet May repeat in 2 hours if headache persists or recurs. 9 tablet 2  . TRULICITY 1.5 TF/5.7DU SOPN INJECT 1.5MG  SQ ONCE A WEEK 6 mL 0   No current facility-administered medications for this visit.     Past Surgical History:  Procedure Laterality Date  . COLONOSCOPY  2004   Dr. Jim Desanctis: small fissue and hemorrhoids  . COLONOSCOPY  12/2011   Dr. Paulita Fujita: normal  . CYSTOSCOPY W/ URETERAL STENT PLACEMENT Right 08/13/2013   Procedure: CYSTOSCOPY WITH RETROGRADE PYELOGRAM/URETERAL STENT PLACEMENT;  Surgeon: Marissa Nestle, MD;  Location: [facility];  Service: Urology;  Laterality: Right;  . EXTRACORPOREAL SHOCK WAVE LITHOTRIPSY Right 08/19/2013   Procedure:  EXTRACORPOREAL SHOCK WAVE LITHOTRIPSY (ESWL) RIGHT URETERAL CALCULUS;  Surgeon: Marissa Nestle, MD;  Location: [facility];  Service: Urology;  Laterality: Right;  . WRIST SURGERY Right      Allergies  Allergen Reactions  . Codeine Nausea Only and Other (See Comments)    NAUSEA AND CAUSES HER TO HAVE BAD DREAMS  . Gabapentin     Confusion   . Lisinopril     Coughing      Family History  Problem Relation Age of Onset  . Colon cancer Maternal Grandmother        age 52  . Colon polyps Mother   . CAD Brother   . Rheum arthritis Sister      Social History Ms. Cech reports that she has never smoked. She has never used smokeless tobacco. Ms. Caba reports no history of alcohol use.   Review of Systems CONSTITUTIONAL: No weight loss, fever, chills, weakness or fatigue.  HEENT: Eyes: No visual loss, blurred vision, double vision or yellow sclerae.No hearing loss, sneezing, congestion, runny nose or sore throat.  SKIN: No rash or itching.  CARDIOVASCULAR:  RESPIRATORY: No shortness of breath, cough or sputum.  GASTROINTESTINAL: No anorexia, nausea, vomiting or diarrhea. No abdominal pain or blood.  GENITOURINARY: No burning on urination, no polyuria NEUROLOGICAL: No headache, dizziness, syncope, paralysis, ataxia, numbness or tingling in the extremities. No change in bowel or bladder control.  MUSCULOSKELETAL: No muscle, back pain, joint pain or stiffness.  LYMPHATICS: No enlarged nodes. No history of splenectomy.  PSYCHIATRIC: No history of depression or anxiety.  ENDOCRINOLOGIC: No reports of sweating, cold or heat intolerance. No polyuria or polydipsia.  Marland Kitchen   Physical Examination There were no vitals filed for this visit. There were no vitals filed for this visit.  Gen: resting comfortably, no acute distress HEENT: no scleral icterus, pupils equal round and reactive, no palptable cervical adenopathy,  CV Resp: Clear to auscultation bilaterally GI: abdomen is soft,  non-tender, non-distended, normal bowel sounds, no hepatosplenomegaly MSK: extremities are warm, no edema.  Skin: warm, no rash Neuro:  no focal deficits Psych: appropriate affect   Diagnostic Studies 08/2015 GXT  There was no ST segment deviation noted during stress.  No T wave inversion was noted during stress.  Motion artifact No definite ST changes to suggest ischemia   Finalized by Fay Records, MD on Mon Jan 23, 2016 3:32 PM   There was no ST segment deviation noted during stress.  No T wave inversion was noted during stress.  Pressure rate product is 20, 701. Test at marginal workload For heart rate achieved, there were no ischemic EKG changes Conclusion limited.  08/2015 echo Study Conclusions  - Left ventricle: The cavity size was normal. Wall thickness was at the upper limits of normal. Systolic function was normal. The estimated ejection fraction was in the range of 60% to 65%. Wall motion was normal; there were no regional wall motion abnormalities. Left ventricular diastolic function parameters were normal. - Ascending aorta: The ascending aorta was mildly ectatic. - Mitral valve: There was trivial regurgitation. - Right atrium: Central venous pressure (est): 3 mm Hg. - Tricuspid valve: There was trivial regurgitation. - Pulmonary arteries: PA peak pressure: 23 mm Hg (S). - Pericardium, extracardiac: There was no pericardial effusion.  Impressions:  - Upper normal LV wall thickness with LVEF 60-65% and grossly normal diastolic function. Mildly ectatic ascending aorta. Trivial mitral and tricuspid regurgitation. Estimated PASP 23 mmHg.   01/2017 nuclear stress  No diagnostic ST segment changes to indicate ischemia. Low risk Duke treadmill score 4.5.  Blood pressure demonstrated a hypertensive response to exercise.  Small, mild intensity, fixed anteroapical defect consistent with breast attenuation.  This is a low risk  study.  Nuclear stress EF: 76%.   08/2019 coronary CTA IMPRESSION: 1. Minimal CAD, CADRADS = 1.  2. The patient's coronary artery calcium score is 10, which places the patient in the 67th percentile for age and sex matched controls.  3. Normal coronary origin with right dominance.      Assessment and Plan  1. Chest pain -negative cardiac CT for significant disease, her symptoms are not cardiac - no change in chronic symtpoms, continue to monitor.   2. HTN -essentialyl at goal, continue current meds   3. Hyperlipidemia -continue statin, recent increase in atorvastatin to 40mg  daily - repeat lipids next visit if not done prior by pcp      Arnoldo Lenis, M.D.

## 2020-09-08 ENCOUNTER — Encounter: Payer: Self-pay | Admitting: Cardiology

## 2020-09-08 ENCOUNTER — Ambulatory Visit: Payer: PPO | Admitting: Cardiology

## 2020-09-08 VITALS — BP 116/74 | HR 72 | Ht 62.0 in | Wt 189.2 lb

## 2020-09-08 DIAGNOSIS — I10 Essential (primary) hypertension: Secondary | ICD-10-CM

## 2020-09-08 DIAGNOSIS — E785 Hyperlipidemia, unspecified: Secondary | ICD-10-CM

## 2020-09-08 DIAGNOSIS — E1165 Type 2 diabetes mellitus with hyperglycemia: Secondary | ICD-10-CM

## 2020-09-08 DIAGNOSIS — R0789 Other chest pain: Secondary | ICD-10-CM

## 2020-09-08 NOTE — Progress Notes (Signed)
Clinical Summary Melissa Schneider is a 65 y.o.female seen today for follow up of the following medical problems.  1. Chest pain - started off and on x 10 years ago. Squeezing like feeling left sided, 4-5/10. Often occurs at rest. No other associated symptoms. Not positional, but can feel better with deep breaths. Lasts up to 1 hour. Occurs approx once a week. No relation to food. Can have some dysphagia at times that is separate. Has had some increase in frequency, stable severity - can get some DOE, example walking up stairs which is new, though she does report 40 lbs weight gain over the last year. - no LE edema, no orthopnea, no PND.  CAD risk factors: DM2, HL, brother MI age 23.    - 01/2017 nuclear stress no ischemia.  08/2019 coronary CTA: minimal CAD, coronary CA score 1, no significant disease. - still with chest pains at times. Can have some acid like regurgitation in her mouth. Usually occurs at night, better w/ tums  2. HTN -she is compliant with meds  3. Hyperlipidemia -compliant with statin. Labs followed by pcp, needs repeat  - in 01/2020 we increasedatorvato 40mg  daily - compliant with statin - has not had repeat labs since change   Past Medical History:  Diagnosis Date  . Anemia 07/23/2011  . Anxiety   . Diabetes mellitus   . Diabetic gastroparesis ([place]) 07/23/2011   Suspected  . Headache(784.0)   . Hearing difficulty   . Hyperlipidemia   . Hypothyroidism   . Kidney stones   . Microcytic anemia 07/23/2011  . Thalassemia minor    ????? not really worked up per pt but daughter has it???     Allergies  Allergen Reactions  . Codeine Nausea Only and Other (See Comments)    NAUSEA AND CAUSES HER TO HAVE BAD DREAMS  . Gabapentin     Confusion   . Lisinopril     Coughing     Current Outpatient Medications  Medication Sig Dispense Refill  . aspirin 81 MG tablet Take 81 mg by mouth daily.    Marland Kitchen atorvastatin (LIPITOR) 40 MG tablet Take 1 tablet  (40 mg total) by mouth daily. 90 tablet 1  . Continuous Blood Gluc Receiver (FREESTYLE LIBRE 2 READER) DEVI Use to test blood sugar up to 6 times daily. DX: E11.9 1 each 0  . Continuous Blood Gluc Sensor (FREESTYLE LIBRE 2 SENSOR) MISC Use to test blood sugar up to 6 times daily. DX: E11.9 2 each 11  . docusate sodium (COLACE) 100 MG capsule Take 200 mg by mouth daily as needed.     Marland Kitchen glucose blood (GNP EASY TOUCH GLUCOSE TEST) test strip EVERY DAY 100 each 11  . Insulin Pen Needle (PEN NEEDLES) 32G X 4 MM MISC 1 each by Does not apply route 4 (four) times daily. 100 each 12  . Insulin Syringe-Needle U-100 (TRUEPLUS INSULIN SYRINGE) 31G X 5/16" 1 ML MISC AT BEDTIME Dx E11.8 90 each 3  . LANTUS 100 UNIT/ML injection INJECT 0.22-0.3 ML SQ AT BEDTIME (22-30 UNITS TOTAL) 10 mL 0  . levothyroxine (SYNTHROID) 137 MCG tablet TAKE ONE TABLET EACH MORNING BEFORE BREAKFAST 30 tablet 5  . losartan (COZAAR) 25 MG tablet TAKE ONE (1) TABLET EACH DAY 90 tablet 1  . metFORMIN (GLUCOPHAGE) 500 MG tablet Take 2 tablets (1,000 mg total) by mouth daily with breakfast. (Patient not taking: Reported on 06/15/2020) 180 tablet 1  . Multiple Vitamin (MULTIVITAMIN WITH MINERALS)  TABS tablet Take 1 tablet by mouth daily.    Marland Kitchen NOVOLOG FLEXPEN 100 UNIT/ML FlexPen INJECT 10-17 UNITS SQ 3 TIMES DAILY WITHMEALS 15 mL 0  . SUMAtriptan (IMITREX) 100 MG tablet May repeat in 2 hours if headache persists or recurs. 9 tablet 2  . TRULICITY 1.5 VQ/0.0QQ SOPN INJECT 1.5MG  SQ ONCE A WEEK 6 mL 0   No current facility-administered medications for this visit.     Past Surgical History:  Procedure Laterality Date  . COLONOSCOPY  2004   Dr. Jim Desanctis: small fissue and hemorrhoids  . COLONOSCOPY  12/2011   Dr. Paulita Fujita: normal  . CYSTOSCOPY W/ URETERAL STENT PLACEMENT Right 08/13/2013   Procedure: CYSTOSCOPY WITH RETROGRADE PYELOGRAM/URETERAL STENT PLACEMENT;  Surgeon: Marissa Nestle, MD;  Location: [facility];  Service: Urology;   Laterality: Right;  . EXTRACORPOREAL SHOCK WAVE LITHOTRIPSY Right 08/19/2013   Procedure: EXTRACORPOREAL SHOCK WAVE LITHOTRIPSY (ESWL) RIGHT URETERAL CALCULUS;  Surgeon: Marissa Nestle, MD;  Location: [facility];  Service: Urology;  Laterality: Right;  . WRIST SURGERY Right      Allergies  Allergen Reactions  . Codeine Nausea Only and Other (See Comments)    NAUSEA AND CAUSES HER TO HAVE BAD DREAMS  . Gabapentin     Confusion   . Lisinopril     Coughing      Family History  Problem Relation Age of Onset  . Colon cancer Maternal Grandmother        age 76  . Colon polyps Mother   . CAD Brother   . Rheum arthritis Sister      Social History Melissa Schneider reports that she has never smoked. She has never used smokeless tobacco. Melissa Schneider reports no history of alcohol use.   Review of Systems CONSTITUTIONAL: No weight loss, fever, chills, weakness or fatigue.  HEENT: Eyes: No visual loss, blurred vision, double vision or yellow sclerae.No hearing loss, sneezing, congestion, runny nose or sore throat.  SKIN: No rash or itching.  CARDIOVASCULAR: per hpi RESPIRATORY: No shortness of breath, cough or sputum.  GASTROINTESTINAL: No anorexia, nausea, vomiting or diarrhea. No abdominal pain or blood.  GENITOURINARY: No burning on urination, no polyuria NEUROLOGICAL: No headache, dizziness, syncope, paralysis, ataxia, numbness or tingling in the extremities. No change in bowel or bladder control.  MUSCULOSKELETAL: No muscle, back pain, joint pain or stiffness.  LYMPHATICS: No enlarged nodes. No history of splenectomy.  PSYCHIATRIC: No history of depression or anxiety.  ENDOCRINOLOGIC: No reports of sweating, cold or heat intolerance. No polyuria or polydipsia.  Marland Kitchen   Physical Examination Today's Vitals   09/08/20 0854  BP: 116/74  Pulse: 72  SpO2: 98%  Weight: 189 lb 3.2 oz (85.8 kg)  Height: 5\' 2"  (1.575 m)   Body mass index is 34.61 kg/m.  Gen: resting comfortably, no  acute distress HEENT: no scleral icterus, pupils equal round and reactive, no palptable cervical adenopathy,  CV: RRR, no m/r/g, no jvd Resp: Clear to auscultation bilaterally GI: abdomen is soft, non-tender, non-distended, normal bowel sounds, no hepatosplenomegaly MSK: extremities are warm, no edema.  Skin: warm, no rash Neuro:  no focal deficits Psych: appropriate affect   Diagnostic Studies  08/2015 GXT  There was no ST segment deviation noted during stress.  No T wave inversion was noted during stress.  Motion artifact No definite ST changes to suggest ischemia   Finalized by Fay Records, MD on Mon Jan 23, 2016 3:32 PM   There was no ST  segment deviation noted during stress.  No T wave inversion was noted during stress.  Pressure rate product is 20, 701. Test at marginal workload For heart rate achieved, there were no ischemic EKG changes Conclusion limited.     08/2015 echo Study Conclusions  - Left ventricle: The cavity size was normal. Wall thickness was at the upper limits of normal. Systolic function was normal. The estimated ejection fraction was in the range of 60% to 65%. Wall motion was normal; there were no regional wall motion abnormalities. Left ventricular diastolic function parameters were normal. - Ascending aorta: The ascending aorta was mildly ectatic. - Mitral valve: There was trivial regurgitation. - Right atrium: Central venous pressure (est): 3 mm Hg. - Tricuspid valve: There was trivial regurgitation. - Pulmonary arteries: PA peak pressure: 23 mm Hg (S). - Pericardium, extracardiac: There was no pericardial effusion.  Impressions:  - Upper normal LV wall thickness with LVEF 60-65% and grossly normal diastolic function. Mildly ectatic ascending aorta. Trivial mitral and tricuspid regurgitation. Estimated PASP 23 mmHg.   01/2017 nuclear stress  No diagnostic ST segment changes to indicate ischemia. Low risk  Duke treadmill score 4.5.  Blood pressure demonstrated a hypertensive response to exercise.  Small, mild intensity, fixed anteroapical defect consistent with breast attenuation.  This is a low risk study.  Nuclear stress EF: 76%.   08/2019 coronary CTA IMPRESSION: 1. Minimal CAD, CADRADS = 1.  2. The patient's coronary artery calcium score is 10, which places the patient in the 67th percentile for age and sex matched controls.  3. Normal coronary origin with right dominance.       Assessment and Plan  1. Chest pain -negative cardiac CT for significant disease, her symptoms are not cardiac -occur mostly at night with laying down, at times better with tums. May be reflux related, will try OTC pepcid and monitor symptoms - EKG today shows NSR  2. HTN - she is at goal, continue current meds   3. Hyperlipidemia -repeat labs, continue statin    Arnoldo Lenis, M.D.

## 2020-09-08 NOTE — Patient Instructions (Signed)
Your physician recommends that you schedule a follow-up appointment in: [facility] has recommended you make the following change in your medication:   START OVER THE COUNTER PEPCID  Your physician recommends that you return for lab work - PLEASE FAST 6-8 HOURS PRIOR TO LAB WORK   Thank you for choosing [facility]!!

## 2020-10-06 ENCOUNTER — Other Ambulatory Visit: Payer: Self-pay

## 2020-10-06 ENCOUNTER — Other Ambulatory Visit: Payer: PPO

## 2020-10-06 DIAGNOSIS — E1165 Type 2 diabetes mellitus with hyperglycemia: Secondary | ICD-10-CM | POA: Diagnosis not present

## 2020-10-06 DIAGNOSIS — I10 Essential (primary) hypertension: Secondary | ICD-10-CM | POA: Diagnosis not present

## 2020-10-06 DIAGNOSIS — E785 Hyperlipidemia, unspecified: Secondary | ICD-10-CM | POA: Diagnosis not present

## 2020-10-06 NOTE — Addendum Note (Signed)
Addended by: Julian Hy T on: 10/06/2020 10:28 AM   Modules accepted: Orders

## 2020-10-07 ENCOUNTER — Other Ambulatory Visit: Payer: Self-pay | Admitting: Family Medicine

## 2020-10-07 DIAGNOSIS — E1165 Type 2 diabetes mellitus with hyperglycemia: Secondary | ICD-10-CM

## 2020-10-07 DIAGNOSIS — IMO0002 Reserved for concepts with insufficient information to code with codable children: Secondary | ICD-10-CM

## 2020-10-07 LAB — CBC
Hematocrit: 39.8 % (ref 34.0–46.6)
Hemoglobin: 12.2 g/dL (ref 11.1–15.9)
MCH: 20.9 pg — ABNORMAL LOW (ref 26.6–33.0)
MCHC: 30.7 g/dL — ABNORMAL LOW (ref 31.5–35.7)
MCV: 68 fL — ABNORMAL LOW (ref 79–97)
Platelets: 303 10*3/uL (ref 150–450)
RBC: 5.85 x10E6/uL — ABNORMAL HIGH (ref 3.77–5.28)
RDW: 14.8 % (ref 11.7–15.4)
WBC: 6.3 10*3/uL (ref 3.4–10.8)

## 2020-10-07 LAB — HEMOGLOBIN A1C
Est. average glucose Bld gHb Est-mCnc: 154 mg/dL
Hgb A1c MFr Bld: 7 % — ABNORMAL HIGH (ref 4.8–5.6)

## 2020-10-07 LAB — LIPID PANEL
Chol/HDL Ratio: 2.6 ratio (ref 0.0–4.4)
Cholesterol, Total: 210 mg/dL — ABNORMAL HIGH (ref 100–199)
HDL: 81 mg/dL (ref >39)
LDL Chol Calc (NIH): 119 mg/dL — ABNORMAL HIGH (ref 0–99)
Triglycerides: 57 mg/dL (ref 0–149)
VLDL Cholesterol Cal: 10 mg/dL (ref 5–40)

## 2020-10-07 LAB — BASIC METABOLIC PANEL
BUN/Creatinine Ratio: 20 (ref 12–28)
BUN: 16 mg/dL (ref 8–27)
CO2: 20 mmol/L (ref 20–29)
Calcium: 9.3 mg/dL (ref 8.7–10.3)
Chloride: 101 mmol/L (ref 96–106)
Creatinine, Ser: 0.81 mg/dL (ref 0.57–1.00)
Glucose: 149 mg/dL — ABNORMAL HIGH (ref 65–99)
Potassium: 4.3 mmol/L (ref 3.5–5.2)
Sodium: 138 mmol/L (ref 134–144)
eGFR: 82 mL/min/{1.73_m2} (ref >59)

## 2020-10-07 LAB — TSH: TSH: 2.43 u[IU]/mL (ref 0.450–4.500)

## 2020-10-07 LAB — MAGNESIUM: Magnesium: 1.8 mg/dL (ref 1.6–2.3)

## 2020-10-26 ENCOUNTER — Ambulatory Visit (INDEPENDENT_AMBULATORY_CARE_PROVIDER_SITE_OTHER): Payer: PPO | Admitting: Family Medicine

## 2020-10-26 ENCOUNTER — Ambulatory Visit: Payer: PPO | Admitting: Family Medicine

## 2020-10-26 ENCOUNTER — Other Ambulatory Visit (HOSPITAL_COMMUNITY)
Admission: RE | Admit: 2020-10-26 | Discharge: 2020-10-26 | Disposition: A | Discharge status: Home / Self Care | Payer: PPO | Source: Ambulatory Visit | Attending: Family Medicine | Admitting: Family Medicine

## 2020-10-26 ENCOUNTER — Encounter: Payer: Self-pay | Admitting: Family Medicine

## 2020-10-26 ENCOUNTER — Other Ambulatory Visit: Payer: Self-pay

## 2020-10-26 VITALS — BP 106/59 | HR 80 | Temp 98.2°F | Ht 62.0 in | Wt 189.0 lb

## 2020-10-26 DIAGNOSIS — E1122 Type 2 diabetes mellitus with diabetic chronic kidney disease: Secondary | ICD-10-CM

## 2020-10-26 DIAGNOSIS — L819 Disorder of pigmentation, unspecified: Secondary | ICD-10-CM

## 2020-10-26 DIAGNOSIS — E039 Hypothyroidism, unspecified: Secondary | ICD-10-CM | POA: Diagnosis not present

## 2020-10-26 DIAGNOSIS — N183 Chronic kidney disease, stage 3 unspecified: Secondary | ICD-10-CM | POA: Diagnosis not present

## 2020-10-26 DIAGNOSIS — H6121 Impacted cerumen, right ear: Secondary | ICD-10-CM | POA: Diagnosis not present

## 2020-10-26 DIAGNOSIS — I152 Hypertension secondary to endocrine disorders: Secondary | ICD-10-CM

## 2020-10-26 DIAGNOSIS — E1142 Type 2 diabetes mellitus with diabetic polyneuropathy: Secondary | ICD-10-CM | POA: Diagnosis not present

## 2020-10-26 DIAGNOSIS — Z1151 Encounter for screening for human papillomavirus (HPV): Secondary | ICD-10-CM | POA: Insufficient documentation

## 2020-10-26 DIAGNOSIS — Z0001 Encounter for general adult medical examination with abnormal findings: Secondary | ICD-10-CM

## 2020-10-26 DIAGNOSIS — E1159 Type 2 diabetes mellitus with other circulatory complications: Secondary | ICD-10-CM

## 2020-10-26 DIAGNOSIS — Z1211 Encounter for screening for malignant neoplasm of colon: Secondary | ICD-10-CM | POA: Diagnosis not present

## 2020-10-26 DIAGNOSIS — E785 Hyperlipidemia, unspecified: Secondary | ICD-10-CM

## 2020-10-26 DIAGNOSIS — Z124 Encounter for screening for malignant neoplasm of cervix: Secondary | ICD-10-CM | POA: Insufficient documentation

## 2020-10-26 DIAGNOSIS — Z0000 Encounter for general adult medical examination without abnormal findings: Secondary | ICD-10-CM

## 2020-10-26 DIAGNOSIS — E1169 Type 2 diabetes mellitus with other specified complication: Secondary | ICD-10-CM | POA: Diagnosis not present

## 2020-10-26 NOTE — Progress Notes (Signed)
Melissa Schneider is a 65 y.o. female presents to office today for annual physical exam examination.    Concerns today include: 1. Type 2 Diabetes with hypertension, hyperlipidemia, CKD3 and polyneuropathy:  Hemoglobin A1c showed a great improvement since our last check.  It was down to 7.0 from 9.6.  She is currently taking 18 to 19 units of her Lantus at bedtime and 8 units of NovoLog with meals.  She does report a couple of hypoglycemic episodes noted on her freestyle libre to the 46s and 60s.  She is considering backing down further on her Lantus.  She has been trying to work on weight loss and improving physical activity by walking daily.  Last eye exam: UTD Last foot exam: needs Last A1c:  Lab Results  Component Value Date   HGBA1C 7.0 (H) 10/06/2020   Nephropathy screen indicated?: on ARB Last flu, zoster and/or pneumovax:  Immunization History  Administered Date(s) Administered  . Influenza Whole 05/16/2010  . Influenza,inj,Quad PF,6+ Mos 05/10/2015, 04/26/2016, 06/19/2017, 05/28/2018  . Pneumococcal Polysaccharide-23 07/16/2008, 01/30/2016  . Td 07/17/2007  . Tdap 07/28/2019    ROS: No chest pain, shortness of breath.  No foot ulcerations or visual disturbance.  She does feel that her close up vision is getting somewhat worse and will be planning to see eye doctor soon  Occupation: Disabled due to deafness, Marital status: Married, Substance use: None Diet: Carb restricted, Exercise: Walks regularly Last eye exam: Needs Last dental exam: Up-to-date Last colonoscopy: Needs.  Previously seen by Dr. Paulita Fujita but she would like to move her care to [facility] as she no longer goes to [place] Last mammogram: Needs.  Last at [facility] hospital but would like to schedule here on the bus Last pap smear: Needs Refills needed today: none Immunizations needed:  Immunization History  Administered Date(s) Administered  . Influenza Whole 05/16/2010  . Influenza,inj,Quad PF,6+ Mos  05/10/2015, 04/26/2016, 06/19/2017, 05/28/2018  . Pneumococcal Polysaccharide-23 07/16/2008, 01/30/2016  . Td 07/17/2007  . Tdap 07/28/2019     Past Medical History:  Diagnosis Date  . Anemia 07/23/2011  . Anxiety   . Diabetes mellitus   . Diabetic gastroparesis ([place]) 07/23/2011   Suspected  . Headache(784.0)   . Hearing difficulty   . Hyperlipidemia   . Hypothyroidism   . Kidney stones   . Microcytic anemia 07/23/2011  . Thalassemia minor    ????? not really worked up per pt but daughter has it???   Social History   Socioeconomic History  . Marital status: Married    Spouse name: [name]  . Number of children: 1  . Years of education: Not on file  . Highest education level: Some college, no degree  Occupational History  . Occupation: Scientific laboratory technician: [employer]  . Occupation: Retired  Tobacco Use  . Smoking status: Never Smoker  . Smokeless tobacco: Never Used  . Tobacco comment: Never smoked  Vaping Use  . Vaping Use: Never used  Substance and Sexual Activity  . Alcohol use: No    Alcohol/week: 0.0 standard drinks  . Drug use: No  . Sexual activity: Not Currently  Other Topics Concern  . Not on file  Social History Narrative  . Not on file   Social Determinants of Health   Financial Resource Strain: Low Risk   . Difficulty of Paying Living Expenses: Not hard at all  Food Insecurity: No Food Insecurity  . Worried About Charity fundraiser in  the Last Year: Never true  . Ran Out of Food in the Last Year: Never true  Transportation Needs: No Transportation Needs  . Lack of Transportation (Medical): No  . Lack of Transportation (Non-Medical): No  Physical Activity: Sufficiently Active  . Days of Exercise per Week: 7 days  . Minutes of Exercise per Session: 50 min  Stress: No Stress Concern Present  . Feeling of Stress : Not at all  Social Connections: Socially Integrated  . Frequency of Communication with Friends and Family: More  than three times a week  . Frequency of Social Gatherings with Friends and Family: More than three times a week  . Attends Religious Services: More than 4 times per year  . Active Member of Clubs or Organizations: Yes  . Attends Archivist Meetings: More than 4 times per year  . Marital Status: Married  Human resources officer Violence: Not on file   Past Surgical History:  Procedure Laterality Date  . COLONOSCOPY  2004   Dr. Jim Desanctis: small fissue and hemorrhoids  . COLONOSCOPY  12/2011   Dr. Paulita Fujita: normal  . CYSTOSCOPY W/ URETERAL STENT PLACEMENT Right 08/13/2013   Procedure: CYSTOSCOPY WITH RETROGRADE PYELOGRAM/URETERAL STENT PLACEMENT;  Surgeon: Marissa Nestle, MD;  Location: [facility];  Service: Urology;  Laterality: Right;  . EXTRACORPOREAL SHOCK WAVE LITHOTRIPSY Right 08/19/2013   Procedure: EXTRACORPOREAL SHOCK WAVE LITHOTRIPSY (ESWL) RIGHT URETERAL CALCULUS;  Surgeon: Marissa Nestle, MD;  Location: [facility];  Service: Urology;  Laterality: Right;  . WRIST SURGERY Right    Family History  Problem Relation Age of Onset  . Colon cancer Maternal Grandmother        age 22  . Colon polyps Mother   . CAD Brother   . Rheum arthritis Sister     Current Outpatient Medications:  .  aspirin 81 MG tablet, Take 81 mg by mouth daily., Disp: , Rfl:  .  atorvastatin (LIPITOR) 40 MG tablet, Take 40 mg by mouth daily., Disp: , Rfl:  .  Continuous Blood Gluc Receiver (FREESTYLE LIBRE 2 READER) DEVI, Use to test blood sugar up to 6 times daily. DX: E11.9, Disp: 1 each, Rfl: 0 .  Continuous Blood Gluc Sensor (FREESTYLE LIBRE 2 SENSOR) MISC, Use to test blood sugar up to 6 times daily. DX: E11.9, Disp: 2 each, Rfl: 11 .  docusate sodium (COLACE) 100 MG capsule, Take 200 mg by mouth daily as needed. , Disp: , Rfl:  .  glucose blood (GNP EASY TOUCH GLUCOSE TEST) test strip, EVERY DAY, Disp: 100 each, Rfl: 11 .  Insulin Pen Needle (PEN NEEDLES) 32G X 4 MM MISC, 1 each by Does not apply route 4  (four) times daily., Disp: 100 each, Rfl: 12 .  Insulin Syringe-Needle U-100 (TRUEPLUS INSULIN SYRINGE) 31G X 5/16" 1 ML MISC, AT BEDTIME Dx E11.8, Disp: 90 each, Rfl: 3 .  LANTUS 100 UNIT/ML injection, INJECT 0.22-0.3 ML SQ AT BEDTIME (22-30 UNITS TOTAL), Disp: 10 mL, Rfl: 0 .  levothyroxine (SYNTHROID) 137 MCG tablet, TAKE ONE TABLET EACH MORNING BEFORE BREAKFAST, Disp: 30 tablet, Rfl: 5 .  losartan (COZAAR) 25 MG tablet, TAKE ONE (1) TABLET EACH DAY, Disp: 90 tablet, Rfl: 1 .  metFORMIN (GLUCOPHAGE) 500 MG tablet, Take 2 tablets (1,000 mg total) by mouth daily with breakfast., Disp: 180 tablet, Rfl: 1 .  Multiple Vitamin (MULTIVITAMIN WITH MINERALS) TABS tablet, Take 1 tablet by mouth daily., Disp: , Rfl:  .  NOVOLOG FLEXPEN 100 UNIT/ML FlexPen,  INJECT 10-17 UNITS SQ 3 TIMES DAILY WITHMEALS, Disp: 15 mL, Rfl: 0 .  SUMAtriptan (IMITREX) 100 MG tablet, May repeat in 2 hours if headache persists or recurs., Disp: 9 tablet, Rfl: 2 .  TRULICITY 1.5 QP/6.1PJ SOPN, INJECT 1.5MG  SQ ONCE A WEEK, Disp: 6 mL, Rfl: 0  Allergies  Allergen Reactions  . Codeine Nausea Only and Other (See Comments)    NAUSEA AND CAUSES HER TO HAVE BAD DREAMS  . Gabapentin     Confusion   . Lisinopril     Coughing     ROS: Review of Systems A comprehensive review of systems was negative except for: Eyes: positive for contacts/glasses and difficulty with near vision Genitourinary: positive for vaginal dryness Hematologic/lymphatic: positive for new moles on legs.    Physical exam BP (!) 106/59   Pulse 80   Temp 98.2 F (36.8 C)   Ht 5\' 2"  (1.575 m)   Wt 189 lb (85.7 kg)   SpO2 98%   BMI 34.57 kg/m  General appearance: alert, cooperative, no distress and mildly obese Head: Normocephalic, without obvious abnormality, atraumatic Eyes: negative findings: lids and lashes normal, conjunctivae and sclerae normal, corneas clear, pupils equal, round, reactive to light and accomodation and visual fields full to  confrontation Ears: right TM obscured by cerumen. Nose: Nares normal. Septum midline. Mucosa normal. No drainage or sinus tenderness. Throat: lips, mucosa, and tongue normal; teeth and gums normal Neck: no adenopathy, supple, symmetrical, trachea midline and thyroid not enlarged, symmetric, no tenderness/mass/nodules Back: symmetric, no curvature. ROM normal. No CVA tenderness. Lungs: clear to auscultation bilaterally Breasts: normal appearance, no masses or tenderness, Inspection negative, No nipple retraction or dimpling, No nipple discharge or bleeding, No axillary or supraclavicular adenopathy Heart: regular rate and rhythm, S1, S2 normal, no murmur, click, rub or gallop Abdomen: soft, non-tender; bowel sounds normal; no masses,  no organomegaly Pelvic: cervix normal in appearance, external genitalia normal, no adnexal masses or tenderness, no cervical motion tenderness, rectovaginal septum normal, uterus normal size, shape, and consistency, vagina normal without discharge and scant bleeding with pap Extremities: extremities normal, atraumatic, no cyanosis or edema Pulses: 2+ and symmetric Skin: Several pigmented nevi noted along the lower extremities bilaterally.  She has a healing area of ecchymosis on the left forearm Lymph nodes: Cervical, supraclavicular, and axillary nodes normal. Neurologic: Alert and oriented X 3, normal strength and tone. Normal symmetric reflexes. Normal coordination and gait Diabetic Foot Exam - Simple   Simple Foot Form Diabetic Foot exam was performed with the following findings: Yes 10/26/2020 11:26 AM  Visual Inspection No deformities, no ulcerations, no other skin breakdown bilaterally: Yes Sensation Testing Intact to touch and monofilament testing bilaterally: Yes Pulse Check Posterior Tibialis and Dorsalis pulse intact bilaterally: Yes Comments     Assessment/ Plan: Amedeo Plenty here for annual physical exam.   Annual physical  exam  Screening for malignant neoplasm of cervix - Plan: Cytology - PAP  Diabetes mellitus with stage 3 chronic kidney disease ([place])  Hypertension associated with diabetes ([place])  Hyperlipidemia associated with type 2 diabetes mellitus ([place])  Diabetic polyneuropathy associated with type 2 diabetes mellitus ([place])  Acquired hypothyroidism  Pigmented skin lesion of uncertain nature - Plan: Ambulatory referral to Dermatology  Screen for colon cancer - Plan: Ambulatory referral to Gastroenterology  Impacted cerumen of right ear  Pap smear obtained.  Hopefully this will be her last.  We reviewed her labs.  Overall labs look better than previous checkup.  No  medication changes needed.  She is doing a great job with lifestyle modification and seems to be really on top of her blood sugars.  I think it is fine to decrease the Lantus to 17 units if she is having intermittent hypoglycemic episodes  Thyroid levels were appropriate.  Continue current regimen  Referral to dermatology placed given new pigmented skin lesions of the lower extremity; though no concerning features with the naked eye today  Referral to gastroenterology placed to [facility].  We discussed that it is quite possible they will recommend that she follow-up with Dr. Paulita Fujita despite her no longer residing in [place].  She was good understanding   Right ear was irrigated.  Handout on healthy lifestyle choices, including diet (rich in fruits, vegetables and lean meats and low in salt and simple carbohydrates) and exercise (at least 30 minutes of moderate physical activity daily).  Patient to follow up in 1 year for annual exam or sooner if needed.  Laporche Martelle M. Lajuana Ripple, DO

## 2020-10-26 NOTE — Patient Instructions (Signed)
Schedule mammogram  Will reach out to GI for you for colon cancer screening  You had labs performed today.  You will be contacted with the results of the labs once they are available, usually in the next 3 business days for routine lab work.  If you have an active my chart account, they will be released to your MyChart.  If you prefer to have these labs released to you via telephone, please let us know.  If you had a pap smear or biopsy performed, expect to be contacted in about 7-10 days.  Health Maintenance, Female Adopting a healthy lifestyle and getting preventive care are important in promoting health and wellness. Ask your health care provider about:  The right schedule for you to have regular tests and exams.  Things you can do on your own to prevent diseases and keep yourself healthy. What should I know about diet, weight, and exercise? Eat a healthy diet  Eat a diet that includes plenty of vegetables, fruits, low-fat dairy products, and lean protein.  Do not eat a lot of foods that are high in solid fats, added sugars, or sodium.   Maintain a healthy weight Body mass index (BMI) is used to identify weight problems. It estimates body fat based on height and weight. Your health care provider can help determine your BMI and help you achieve or maintain a healthy weight. Get regular exercise Get regular exercise. This is one of the most important things you can do for your health. Most adults should:  Exercise for at least 150 minutes each week. The exercise should increase your heart rate and make you sweat (moderate-intensity exercise).  Do strengthening exercises at least twice a week. This is in addition to the moderate-intensity exercise.  Spend less time sitting. Even light physical activity can be beneficial. Watch cholesterol and blood lipids Have your blood tested for lipids and cholesterol at 64 years of age, then have this test every 5 years. Have your cholesterol  levels checked more often if:  Your lipid or cholesterol levels are high.  You are older than 64 years of age.  You are at high risk for heart disease. What should I know about cancer screening? Depending on your health history and family history, you may need to have cancer screening at various ages. This may include screening for:  Breast cancer.  Cervical cancer.  Colorectal cancer.  Skin cancer.  Lung cancer. What should I know about heart disease, diabetes, and high blood pressure? Blood pressure and heart disease  High blood pressure causes heart disease and increases the risk of stroke. This is more likely to develop in people who have high blood pressure readings, are of African descent, or are overweight.  Have your blood pressure checked: ? Every 3-5 years if you are 59-51 years of age. ? Every year if you are 32 years old or older. Diabetes Have regular diabetes screenings. This checks your fasting blood sugar level. Have the screening done:  Once every three years after age 61 if you are at a normal weight and have a low risk for diabetes.  More often and at a younger age if you are overweight or have a high risk for diabetes. What should I know about preventing infection? Hepatitis B If you have a higher risk for hepatitis B, you should be screened for this virus. Talk with your health care provider to find out if you are at risk for hepatitis B infection. Hepatitis C Testing  is recommended for:  Everyone born from [birth year] through 1964.  Anyone with known risk factors for hepatitis C. Sexually transmitted infections (STIs)  Get screened for STIs, including gonorrhea and chlamydia, if: ? You are sexually active and are younger than 64 years of age. ? You are older than 64 years of age and your health care provider tells you that you are at risk for this type of infection. ? Your sexual activity has changed since you were last screened, and you are at increased  risk for chlamydia or gonorrhea. Ask your health care provider if you are at risk.  Ask your health care provider about whether you are at high risk for HIV. Your health care provider may recommend a prescription medicine to help prevent HIV infection. If you choose to take medicine to prevent HIV, you should first get tested for HIV. You should then be tested every 3 months for as long as you are taking the medicine. Pregnancy  If you are about to stop having your period (premenopausal) and you may become pregnant, seek counseling before you get pregnant.  Take 400 to 800 micrograms (mcg) of folic acid every day if you become pregnant.  Ask for birth control (contraception) if you want to prevent pregnancy. Osteoporosis and menopause Osteoporosis is a disease in which the bones lose minerals and strength with aging. This can result in bone fractures. If you are 57 years old or older, or if you are at risk for osteoporosis and fractures, ask your health care provider if you should:  Be screened for bone loss.  Take a calcium or vitamin D supplement to lower your risk of fractures.  Be given hormone replacement therapy (HRT) to treat symptoms of menopause. Follow these instructions at home: Lifestyle  Do not use any products that contain nicotine or tobacco, such as cigarettes, e-cigarettes, and chewing tobacco. If you need help quitting, ask your health care provider.  Do not use street drugs.  Do not share needles.  Ask your health care provider for help if you need support or information about quitting drugs. Alcohol use  Do not drink alcohol if: ? Your health care provider tells you not to drink. ? You are pregnant, may be pregnant, or are planning to become pregnant.  If you drink alcohol: ? Limit how much you use to 0-1 drink a day. ? Limit intake if you are breastfeeding.  Be aware of how much alcohol is in your drink. In the [place] one drink equals one 12 oz bottle of beer  (355 mL), one 5 oz glass of wine (148 mL), or one 1 oz glass of hard liquor (44 mL). General instructions  Schedule regular health, dental, and eye exams.  Stay current with your vaccines.  Tell your health care provider if: ? You often feel depressed. ? You have ever been abused or do not feel safe at home. Summary  Adopting a healthy lifestyle and getting preventive care are important in promoting health and wellness.  Follow your health care provider's instructions about healthy diet, exercising, and getting tested or screened for diseases.  Follow your health care provider's instructions on monitoring your cholesterol and blood pressure. This information is not intended to replace advice given to you by your health care provider. Make sure you discuss any questions you have with your health care provider. Document Revised: 06/25/2018 Document Reviewed: 06/25/2018 Elsevier Patient Education  2021 [employer].

## 2020-10-27 LAB — CYTOLOGY - PAP
Comment: NEGATIVE
Diagnosis: NEGATIVE
High risk HPV: NEGATIVE

## 2020-11-07 ENCOUNTER — Ambulatory Visit: Payer: PPO | Admitting: Cardiology

## 2020-11-24 ENCOUNTER — Other Ambulatory Visit: Payer: Self-pay | Admitting: Family Medicine

## 2020-11-24 DIAGNOSIS — IMO0002 Reserved for concepts with insufficient information to code with codable children: Secondary | ICD-10-CM

## 2020-11-24 DIAGNOSIS — E1165 Type 2 diabetes mellitus with hyperglycemia: Secondary | ICD-10-CM

## 2020-11-28 ENCOUNTER — Telehealth: Payer: Self-pay

## 2020-11-28 NOTE — Chronic Care Management (AMB) (Signed)
  Care Management   Note  11/28/2020 Name: Melissa Schneider MRN: [id number] DOB: 1956/03/21  Melissa Schneider is a 66 y.o. year old female who is a primary care patient of Janora Norlander, DO and is actively engaged with the care management team. I reached out to Amedeo Plenty by phone today to assist with re-scheduling a follow up visit with the RN Case Manager  Follow up plan: Unsuccessful telephone outreach attempt made. A HIPAA compliant phone message was left for the patient providing contact information and requesting a return call.  The care management team will reach out to the patient again over the next 4 days.  If patient returns call to provider office, please advise to call [facility]  at [place], [place], [place], [place] [address] Direct Dial: [phone] [email or web address]

## 2020-11-30 ENCOUNTER — Telehealth: Payer: PPO

## 2020-12-16 ENCOUNTER — Other Ambulatory Visit: Payer: Self-pay | Admitting: Family Medicine

## 2020-12-16 DIAGNOSIS — E1165 Type 2 diabetes mellitus with hyperglycemia: Secondary | ICD-10-CM

## 2020-12-16 DIAGNOSIS — IMO0002 Reserved for concepts with insufficient information to code with codable children: Secondary | ICD-10-CM

## 2020-12-21 ENCOUNTER — Other Ambulatory Visit: Payer: Self-pay | Admitting: Family Medicine

## 2020-12-21 DIAGNOSIS — E1165 Type 2 diabetes mellitus with hyperglycemia: Secondary | ICD-10-CM

## 2020-12-21 DIAGNOSIS — IMO0002 Reserved for concepts with insufficient information to code with codable children: Secondary | ICD-10-CM

## 2020-12-23 ENCOUNTER — Ambulatory Visit (INDEPENDENT_AMBULATORY_CARE_PROVIDER_SITE_OTHER): Payer: PPO | Admitting: *Deleted

## 2020-12-23 ENCOUNTER — Encounter: Payer: Self-pay | Admitting: *Deleted

## 2020-12-23 DIAGNOSIS — N183 Chronic kidney disease, stage 3 unspecified: Secondary | ICD-10-CM

## 2020-12-23 DIAGNOSIS — E785 Hyperlipidemia, unspecified: Secondary | ICD-10-CM | POA: Diagnosis not present

## 2020-12-23 DIAGNOSIS — E1122 Type 2 diabetes mellitus with diabetic chronic kidney disease: Secondary | ICD-10-CM | POA: Diagnosis not present

## 2020-12-23 DIAGNOSIS — E1169 Type 2 diabetes mellitus with other specified complication: Secondary | ICD-10-CM | POA: Diagnosis not present

## 2020-12-23 NOTE — Chronic Care Management (AMB) (Signed)
Chronic Care Management   CCM RN Visit Note  12/23/2020 Name: Melissa Schneider MRN: [id number] DOB: Apr 21, 1956  Subjective: Melissa Schneider is a 65 y.o. year old female who is a primary care patient of Melissa Norlander, DO. The care management team was consulted for assistance with disease management and care coordination needs.    Engaged with patient by telephone for follow up visit in response to provider referral for case management and/or care coordination services.   Consent to Services:  The patient was given information about Chronic Care Management services, agreed to services, and gave verbal consent prior to initiation of services.  Please see initial visit note for detailed documentation.   Patient agreed to services and verbal consent obtained.   Assessment: Review of patient past medical history, allergies, medications, health status, including review of consultants reports, laboratory and other test data, was performed as part of comprehensive evaluation and provision of chronic care management services.   SDOH (Social Determinants of Health) assessments and interventions performed:    CCM Care Plan  Allergies  Allergen Reactions   Codeine Nausea Only and Other (See Comments)    NAUSEA AND CAUSES HER TO HAVE BAD DREAMS   Gabapentin     Confusion    Lisinopril     Coughing    Outpatient Encounter Medications as of 12/23/2020  Medication Sig   aspirin 81 MG tablet Take 81 mg by mouth daily.   atorvastatin (LIPITOR) 40 MG tablet Take 40 mg by mouth daily.   Continuous Blood Gluc Receiver (FREESTYLE LIBRE 2 READER) DEVI Use to test blood sugar up to 6 times daily. DX: E11.9   Continuous Blood Gluc Sensor (FREESTYLE LIBRE 2 SENSOR) MISC Use to test blood sugar up to 6 times daily. DX: E11.9   docusate sodium (COLACE) 100 MG capsule Take 200 mg by mouth daily as needed.    glucose blood (GNP EASY TOUCH GLUCOSE TEST) test strip EVERY DAY   Insulin Pen Needle (PEN  NEEDLES) 32G X 4 MM MISC 1 each by Does not apply route 4 (four) times daily.   Insulin Syringe-Needle U-100 (TRUEPLUS INSULIN SYRINGE) 31G X 5/16" 1 ML MISC AT BEDTIME Dx E11.8   LANTUS 100 UNIT/ML injection INJECT 0.22-0.3 ML SQ AT BEDTIME (22-30 UNITS TOTAL)   levothyroxine (SYNTHROID) 137 MCG tablet TAKE ONE TABLET EACH MORNING BEFORE BREAKFAST   losartan (COZAAR) 25 MG tablet TAKE ONE (1) TABLET EACH DAY   metFORMIN (GLUCOPHAGE) 500 MG tablet Take 2 tablets (1,000 mg total) by mouth daily with breakfast. (Patient not taking: Reported on 10/26/2020)   Multiple Vitamin (MULTIVITAMIN WITH MINERALS) TABS tablet Take 1 tablet by mouth daily.   NOVOLOG FLEXPEN 100 UNIT/ML FlexPen INJECT 10-17 UNITS SQ 3 TIMES DAILY WITHMEALS   SUMAtriptan (IMITREX) 100 MG tablet May repeat in 2 hours if headache persists or recurs.   TRULICITY 1.5 EL/3.8BO SOPN INJECT 1.5MG  SQ ONCE A WEEK   No facility-administered encounter medications on file as of 12/23/2020.    Patient Active Problem List   Diagnosis Date Noted   Bilateral impacted cerumen 01/22/2020   Hyperlipidemia associated with type 2 diabetes mellitus ([place]) 09/01/2018   Depressed mood 09/01/2018   Low back pain 04/26/2016   Hearing problem of both ears 01/30/2016   Thyroid activity decreased 11/07/2015   Chest pain 08/11/2015   BMI 35.0-35.9,adult 08/11/2015   Hyperlipidemia 06/16/2015   Migraine 03/17/2015   Diabetic neuropathy ([place]) 03/17/2015   Constipation 04/05/2014   Microcytic  anemia 04/05/2014   Recurrent obstructive pyelonephritis 08/14/2013   Leukocytosis, unspecified 08/13/2013   Obstructive uropathy 08/13/2013   Stone in kidney 08/13/2013   Diabetic gastroparesis ([place]) 07/23/2011   Hypothyroidism 08/24/2010   DM (diabetes mellitus), type 2, uncontrolled ([place]) 08/24/2010    Conditions to be addressed/monitored:HTN and HLD  Care Plan : RNCM: Diabetes Type 2 (Adult)  Updates made by Ilean China, RN since 12/23/2020 12:00 AM      Problem: Glycemic Management (Diabetes, Type 2)   Priority: Medium     Long-Range Goal: Monitor and Manage My Blood Sugar   This Visit's Progress: On track  Recent Progress: On track  Priority: Medium  Note:   Objective: Lab Results  Component Value Date   HGBA1C 7.0 (H) 10/06/2020   HGBA1C 9.6 (H) 01/22/2020   HGBA1C 8.7 (H) 07/28/2019   Lab Results  Component Value Date   MICROALBUR neg 03/17/2015   [place] 119 (H) 10/06/2020   CREATININE 0.81 10/06/2020  Current Barriers:  Chronic Disease Management support and education needs related to diabetes in a patient with hypertension and hyperlipidemia  Nurse Case Manager Clinical Goal(s):  Patient will work with RN Care Manager to address needs related to self-management of diabetes Patient will work with PCP to address needs related to medical management of diabetes Patient will demonstrate ongoing self health care management ability as evidenced by checking blood sugar before meals and at bedtime and PRN and by calling PCP with any readings outside of recommended range.  Interventions:  1:1 collaboration with Melissa Norlander, DO regarding development and update of comprehensive plan of care as evidenced by provider attestation and co-signature Inter-disciplinary care team collaboration (see longitudinal plan of care) Evaluation of current treatment plan related to diabetes and patient's adherence to plan as established by provider. Chart reviewed including relevant office notes and lab results Discussed improvement in A1C and provided praise and encouragement Reviewed and discussed medications and insulin injection schedule Has decreased evening meal insulin to 8 units and lantus to 15 due to some overnight hypoglycemia Discussed continuous blood sugar monitoring No problems with monitor or sensor Alarm lets her know when blood sugar is too high or too low Discussed home blood sugar readings 90 day average is 158 6  low glucose alerts over the past 7 days. Mostly during the night. Several high glucose alerts Discussed diet Encouraged patient to make healthy meal choices and to eat at regular intervals Reviewed upcoming appointment  Asked patient to reach out to PCP with any readings outside of recommended range Previously provided with RN Care Manager telephone number and encouraged to reach out as needed  Patient Goals/Self-Care Activities Over the next 90 days, patient will: Continue to check blood sugar using CGM before meals and at bedtime and as needed if you feel that it is too high or too low enter blood sugar readings and medication or insulin into daily log take the blood sugar log to all doctor visits take the blood sugar meter to all doctor visits  Call PCP [phone] with any readings outside of recommended range Make healthy meal choices Eat meals at regular intervals  Make sure to eat if you are going to take your insulin  Follow Up Plan:  Telephone follow up appointment with care management team member scheduled for: 02/09/21 with RN Care Manager The patient has been provided with contact information for the care management team and has been advised to call with any health related questions or concerns.  Chong Sicilian, BSN, RN-BC Embedded Chronic Care Manager [facility] [place] [facility] Direct Dial: [phone]

## 2020-12-23 NOTE — Patient Instructions (Signed)
Visit Information  PATIENT GOALS:  Goals Addressed               This Visit's Progress     Patient Stated     Monitor and Manage My Blood Sugar (pt-stated)   On track     Timeframe:  Long-Range Goal Priority:  Medium Start Date: 08/22/20                            Expected End Date: 02/18/21                      Follow-up 02/09/21  Continue to check blood sugar using CGM before meals and at bedtime and as needed if you feel that it is too high or too low enter blood sugar readings and medication or insulin into daily log take the blood sugar log to all doctor visits take the blood sugar meter to all doctor visits  Call PCP [phone] with any readings outside of recommended range Make healthy meal choices Eat meals at regular intervals  Make sure to eat if you are going to take your insulin          Patient verbalizes understanding of instructions provided today and agrees to view in [place].   Follow Up Plan:  Telephone follow up appointment with care management team member scheduled for: 02/09/21 with RN Care Manager The patient has been provided with contact information for the care management team and has been advised to call with any health related questions or concerns.   Chong Sicilian, BSN, RN-BC Embedded Chronic Care Manager [facility] [place] [facility] Direct Dial: [phone]

## 2021-01-19 ENCOUNTER — Ambulatory Visit (INDEPENDENT_AMBULATORY_CARE_PROVIDER_SITE_OTHER): Payer: PPO

## 2021-01-19 VITALS — Ht 62.0 in | Wt 180.0 lb

## 2021-01-19 DIAGNOSIS — Z0000 Encounter for general adult medical examination without abnormal findings: Secondary | ICD-10-CM

## 2021-01-19 DIAGNOSIS — Z1231 Encounter for screening mammogram for malignant neoplasm of breast: Secondary | ICD-10-CM

## 2021-01-19 NOTE — Patient Instructions (Signed)
Melissa Schneider , Thank you for taking time to come for your Medicare Wellness Visit. I appreciate your ongoing commitment to your health goals. Please review the following plan we discussed and let me know if I can assist you in the future.   Screening recommendations/referrals: Colonoscopy: Done 01/10/2012 - Repeat in 5 years  Mammogram: Done 08/26/19 - Repeat annually (make appointment) Bone Density: Due ate age 65 Recommended yearly ophthalmology/optometry visit for glaucoma screening and checkup Recommended yearly dental visit for hygiene and checkup  Vaccinations: Influenza vaccine: Due every fall Pneumococcal vaccine: Done 01/29/2014 & 01/30/2016 Tdap vaccine: Done 07/28/2019 - Repeat in 10 years  Shingles vaccine: Due. Shingrix discussed. Please contact your pharmacy for coverage information.    Covid-19: Declined  Advanced directives: Advance directive discussed with you today. I have provided a copy for you to complete at home and have notarized. Once this is complete please bring a copy in to our office so we can scan it into your chart.  Conditions/risks identified: Aim for 30 minutes of exercise or brisk walking each day, drink 6-8 glasses of water and eat lots of fruits and vegetables.   Next appointment: Follow up in one year for your annual wellness visit.   Preventive Care 40-64 Years, Female Preventive care refers to lifestyle choices and visits with your health care provider that can promote health and wellness. What does preventive care include? A yearly physical exam. This is also called an annual well check. Dental exams once or twice a year. Routine eye exams. Ask your health care provider how often you should have your eyes checked. Personal lifestyle choices, including: Daily care of your teeth and gums. Regular physical activity. Eating a healthy diet. Avoiding tobacco and drug use. Limiting alcohol use. Practicing safe sex. Taking low-dose aspirin daily starting  at age 33. Taking vitamin and mineral supplements as recommended by your health care provider. What happens during an annual well check? The services and screenings done by your health care provider during your annual well check will depend on your age, overall health, lifestyle risk factors, and family history of disease. Counseling  Your health care provider may ask you questions about your: Alcohol use. Tobacco use. Drug use. Emotional well-being. Home and relationship well-being. Sexual activity. Eating habits. Work and work Statistician. Method of birth control. Menstrual cycle. Pregnancy history. Screening  You may have the following tests or measurements: Height, weight, and BMI. Blood pressure. Lipid and cholesterol levels. These may be checked every 5 years, or more frequently if you are over 52 years old. Skin check. Lung cancer screening. You may have this screening every year starting at age 68 if you have a 30-pack-year history of smoking and currently smoke or have quit within the past 15 years. Fecal occult blood test (FOBT) of the stool. You may have this test every year starting at age 98. Flexible sigmoidoscopy or colonoscopy. You may have a sigmoidoscopy every 5 years or a colonoscopy every 10 years starting at age 60. Hepatitis C blood test. Hepatitis B blood test. Sexually transmitted disease (STD) testing. Diabetes screening. This is done by checking your blood sugar (glucose) after you have not eaten for a while (fasting). You may have this done every 1-3 years. Mammogram. This may be done every 1-2 years. Talk to your health care provider about when you should start having regular mammograms. This may depend on whether you have a family history of breast cancer. BRCA-related cancer screening. This may be done if  you have a family history of breast, ovarian, tubal, or peritoneal cancers. Pelvic exam and Pap test. This may be done every 3 years starting at age 71.  Starting at age 31, this may be done every 5 years if you have a Pap test in combination with an HPV test. Bone density scan. This is done to screen for osteoporosis. You may have this scan if you are at high risk for osteoporosis. Discuss your test results, treatment options, and if necessary, the need for more tests with your health care provider. Vaccines  Your health care provider may recommend certain vaccines, such as: Influenza vaccine. This is recommended every year. Tetanus, diphtheria, and acellular pertussis (Tdap, Td) vaccine. You may need a Td booster every 10 years. Zoster vaccine. You may need this after age 41. Pneumococcal 13-valent conjugate (PCV13) vaccine. You may need this if you have certain conditions and were not previously vaccinated. Pneumococcal polysaccharide (PPSV23) vaccine. You may need one or two doses if you smoke cigarettes or if you have certain conditions. Talk to your health care provider about which screenings and vaccines you need and how often you need them. This information is not intended to replace advice given to you by your health care provider. Make sure you discuss any questions you have with your health care provider. Document Released: 07/29/2015 Document Revised: 03/21/2016 Document Reviewed: 05/03/2015 Elsevier Interactive Patient Education  2017 [place] Prevention in the Home Falls can cause injuries. They can happen to people of all ages. There are many things you can do to make your home safe and to help prevent falls. What can I do on the outside of my home? Regularly fix the edges of walkways and driveways and fix any cracks. Remove anything that might make you trip as you walk through a door, such as a raised step or threshold. Trim any bushes or trees on the path to your home. Use bright outdoor lighting. Clear any walking paths of anything that might make someone trip, such as rocks or tools. Regularly check to see if  handrails are loose or broken. Make sure that both sides of any steps have handrails. Any raised decks and porches should have guardrails on the edges. Have any leaves, snow, or ice cleared regularly. Use sand or salt on walking paths during winter. Clean up any spills in your garage right away. This includes oil or grease spills. What can I do in the bathroom? Use night lights. Install grab bars by the toilet and in the tub and shower. Do not use towel bars as grab bars. Use non-skid mats or decals in the tub or shower. If you need to sit down in the shower, use a plastic, non-slip stool. Keep the floor dry. Clean up any water that spills on the floor as soon as it happens. Remove soap buildup in the tub or shower regularly. Attach bath mats securely with double-sided non-slip rug tape. Do not have throw rugs and other things on the floor that can make you trip. What can I do in the bedroom? Use night lights. Make sure that you have a light by your bed that is easy to reach. Do not use any sheets or blankets that are too big for your bed. They should not hang down onto the floor. Have a firm chair that has side arms. You can use this for support while you get dressed. Do not have throw rugs and other things on the floor  that can make you trip. What can I do in the kitchen? Clean up any spills right away. Avoid walking on wet floors. Keep items that you use a lot in easy-to-reach places. If you need to reach something above you, use a strong step stool that has a grab bar. Keep electrical cords out of the way. Do not use floor polish or wax that makes floors slippery. If you must use wax, use non-skid floor wax. Do not have throw rugs and other things on the floor that can make you trip. What can I do with my stairs? Do not leave any items on the stairs. Make sure that there are handrails on both sides of the stairs and use them. Fix handrails that are broken or loose. Make sure that  handrails are as long as the stairways. Check any carpeting to make sure that it is firmly attached to the stairs. Fix any carpet that is loose or worn. Avoid having throw rugs at the top or bottom of the stairs. If you do have throw rugs, attach them to the floor with carpet tape. Make sure that you have a light switch at the top of the stairs and the bottom of the stairs. If you do not have them, ask someone to add them for you. What else can I do to help prevent falls? Wear shoes that: Do not have high heels. Have rubber bottoms. Are comfortable and fit you well. Are closed at the toe. Do not wear sandals. If you use a stepladder: Make sure that it is fully opened. Do not climb a closed stepladder. Make sure that both sides of the stepladder are locked into place. Ask someone to hold it for you, if possible. Clearly mark and make sure that you can see: Any grab bars or handrails. First and last steps. Where the edge of each step is. Use tools that help you move around (mobility aids) if they are needed. These include: Canes. Walkers. Scooters. Crutches. Turn on the lights when you go into a dark area. Replace any light bulbs as soon as they burn out. Set up your furniture so you have a clear path. Avoid moving your furniture around. If any of your floors are uneven, fix them. If there are any pets around you, be aware of where they are. Review your medicines with your doctor. Some medicines can make you feel dizzy. This can increase your chance of falling. Ask your doctor what other things that you can do to help prevent falls. This information is not intended to replace advice given to you by your health care provider. Make sure you discuss any questions you have with your health care provider. Document Released: 04/28/2009 Document Revised: 12/08/2015 Document Reviewed: 08/06/2014 Elsevier Interactive Patient Education  2017 [employer].

## 2021-01-19 NOTE — Progress Notes (Signed)
Subjective:   Melissa Schneider is a 64 y.o. female who presents for Medicare Annual (Subsequent) preventive examination.  Virtual Visit via Telephone Note  I connected with  Melissa Schneider on 01/19/21 at 10:30 AM EDT by telephone and verified that I am speaking with the correct person using two identifiers.  Location: Patient: Home Provider: [facility] Persons participating in the virtual visit: patient/Nurse Health Advisor   I discussed the limitations, risks, security and privacy concerns of performing an evaluation and management service by telephone and the availability of in person appointments. The patient expressed understanding and agreed to proceed.  Interactive audio and video telecommunications were attempted between this nurse and patient, however failed, due to patient having technical difficulties OR patient did not have access to video capability.  We continued and completed visit with audio only.  Some vital signs may be absent or patient reported.   Leniyah Martell E Terrace Fontanilla, LPN   Review of Systems     Cardiac Risk Factors include: diabetes mellitus;dyslipidemia;obesity (BMI >30kg/m2)     Objective:    Today's Vitals   01/19/21 1032  Weight: 180 lb (81.6 kg)  Height: 5\' 2"  (1.575 m)   Body mass index is 32.92 kg/m.  Advanced Directives 01/19/2021 01/19/2020 01/12/2019 01/12/2019 04/29/2015 08/22/2013 08/13/2013  Does Patient Have a Medical Advance Directive? No No No No No Patient does not have advance directive;Patient would not like information Patient does not have advance directive;Patient would like information  Does patient want to make changes to medical advance directive? Yes (MAU/Ambulatory/Procedural Areas - Information given) - - - - - -  Would patient like information on creating a medical advance directive? Yes (MAU/Ambulatory/Procedural Areas - Information given) No - Patient declined Yes (MAU/Ambulatory/Procedural Areas - Information given) Yes  (MAU/Ambulatory/Procedural Areas - Information given) No - patient declined information - Advance directive packet given  Pre-existing out of facility DNR order (yellow form or pink MOST form) - - - - - No No    Current Medications (verified) Outpatient Encounter Medications as of 01/19/2021  Medication Sig   aspirin 81 MG tablet Take 81 mg by mouth daily.   atorvastatin (LIPITOR) 40 MG tablet Take 40 mg by mouth daily.   Continuous Blood Gluc Receiver (FREESTYLE LIBRE 2 READER) DEVI Use to test blood sugar up to 6 times daily. DX: E11.9   Continuous Blood Gluc Sensor (FREESTYLE LIBRE 2 SENSOR) MISC Use to test blood sugar up to 6 times daily. DX: E11.9   docusate sodium (COLACE) 100 MG capsule Take 200 mg by mouth daily as needed.    glucose blood (GNP EASY TOUCH GLUCOSE TEST) test strip EVERY DAY   Insulin Pen Needle (PEN NEEDLES) 32G X 4 MM MISC 1 each by Does not apply route 4 (four) times daily.   Insulin Syringe-Needle U-100 (TRUEPLUS INSULIN SYRINGE) 31G X 5/16" 1 ML MISC AT BEDTIME Dx E11.8   LANTUS 100 UNIT/ML injection INJECT 0.22-0.3 ML SQ AT BEDTIME (22-30 UNITS TOTAL)   levothyroxine (SYNTHROID) 137 MCG tablet TAKE ONE TABLET EACH MORNING BEFORE BREAKFAST   losartan (COZAAR) 25 MG tablet TAKE ONE (1) TABLET EACH DAY   metFORMIN (GLUCOPHAGE) 500 MG tablet Take 2 tablets (1,000 mg total) by mouth daily with breakfast. (Patient not taking: Reported on 10/26/2020)   Multiple Vitamin (MULTIVITAMIN WITH MINERALS) TABS tablet Take 1 tablet by mouth daily.   NOVOLOG FLEXPEN 100 UNIT/ML FlexPen INJECT 10-17 UNITS SQ 3 TIMES DAILY WITHMEALS   SUMAtriptan (IMITREX) 100 MG tablet  May repeat in 2 hours if headache persists or recurs.   TRULICITY 1.5 OQ/9.4TM SOPN INJECT 1.5MG  SQ ONCE A WEEK   No facility-administered encounter medications on file as of 01/19/2021.    Allergies (verified) Codeine, Gabapentin, and Lisinopril   History: Past Medical History:  Diagnosis Date   Anemia 07/23/2011    Anxiety    Diabetes mellitus    Diabetic gastroparesis ([place]) 07/23/2011   Suspected   Headache(784.0)    Hearing difficulty    Hyperlipidemia    Hypothyroidism    Kidney stones    Microcytic anemia 07/23/2011   Thalassemia minor    ????? not really worked up per pt but daughter has it???   Past Surgical History:  Procedure Laterality Date   COLONOSCOPY  2004   Dr. Jim Desanctis: small fissue and hemorrhoids   COLONOSCOPY  12/2011   Dr. Paulita Fujita: normal   CYSTOSCOPY W/ URETERAL STENT PLACEMENT Right 08/13/2013   Procedure: CYSTOSCOPY WITH RETROGRADE PYELOGRAM/URETERAL STENT PLACEMENT;  Surgeon: Marissa Nestle, MD;  Location: [facility];  Service: Urology;  Laterality: Right;   EXTRACORPOREAL SHOCK WAVE LITHOTRIPSY Right 08/19/2013   Procedure: EXTRACORPOREAL SHOCK WAVE LITHOTRIPSY (ESWL) RIGHT URETERAL CALCULUS;  Surgeon: Marissa Nestle, MD;  Location: [facility];  Service: Urology;  Laterality: Right;   WRIST SURGERY Right    Family History  Problem Relation Age of Onset   Colon cancer Maternal Grandmother        age 72   Colon polyps Mother    CAD Brother    Rheum arthritis Sister    Social History   Socioeconomic History   Marital status: Married    Spouse name: [name]   Number of children: 1   Years of education: Not on file   Highest education level: Some college, no degree  Occupational History   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   Occupation: Retired  Tobacco Use   Smoking status: Never   Smokeless tobacco: Never   Tobacco comments:    Never smoked  Vaping Use   Vaping Use: Never used  Substance and Sexual Activity   Alcohol use: No    Alcohol/week: 0.0 standard drinks   Drug use: No   Sexual activity: Not Currently  Other Topics Concern   Not on file  Social History Narrative   Lives home with husband - Daughter lives in [place] - she has a great grandchild also   Social Determinants of Radio broadcast assistant Strain: Low  Risk    Difficulty of Paying Living Expenses: Not very hard  Food Insecurity: No Food Insecurity   Worried About Charity fundraiser in the Last Year: Never true   Arboriculturist in the Last Year: Never true  Transportation Needs: No Transportation Needs   Lack of Transportation (Medical): No   Lack of Transportation (Non-Medical): No  Physical Activity: Sufficiently Active   Days of Exercise per Week: 7 days   Minutes of Exercise per Session: 50 min  Stress: No Stress Concern Present   Feeling of Stress : Not at all  Social Connections: Socially Integrated   Frequency of Communication with Friends and Family: More than three times a week   Frequency of Social Gatherings with Friends and Family: More than three times a week   Attends Religious Services: More than 4 times per year   Active Member of [employer] or Organizations: Yes   Attends Archivist Meetings: More than  4 times per year   Marital Status: Married    Tobacco Counseling Counseling given: Not Answered Tobacco comments: Never smoked   Clinical Intake:  Pre-visit preparation completed: Yes  Pain : No/denies pain     BMI - recorded: 32.92 Nutritional Status: BMI > 30  Obese Nutritional Risks: None Diabetes: Yes CBG done?: No Did pt. bring in CBG monitor from home?: No  How often do you need to have someone help you when you read instructions, pamphlets, or other written materials from your doctor or pharmacy?: 1 - Never  Nutrition Risk Assessment:  Has the patient had any N/V/D within the last 2 months?  No  Does the patient have any non-healing wounds?  No  Has the patient had any unintentional weight loss or weight gain?  No   Diabetes:  Is the patient diabetic?  Yes  If diabetic, was a CBG obtained today?  No  Did the patient bring in their glucometer from home?  No  How often do you monitor your CBG's? She wears Libre 2 CGM - this am fasting sugar was 62.   Financial Strains and Diabetes  Management:  Are you having any financial strains with the device, your supplies or your medication? No .  Does the patient want to be seen by Chronic Care Management for management of their diabetes?  No  Would the patient like to be referred to a Nutritionist or for Diabetic Management?  No   Diabetic Exams:  Diabetic Eye Exam: Completed 01/19/2020. Overdue for diabetic eye exam. Pt has been advised about the importance in completing this exam. She has an appt coming up.  Diabetic Foot Exam: Completed 10/26/2020. Pt has been advised about the importance in completing this exam. Pt is scheduled for diabetic foot exam on 04/27/2021.    Interpreter Needed?: No  Information entered by :: Taelon Bendorf, LPN   Activities of Daily Living In your present state of health, do you have any difficulty performing the following activities: 01/19/2021 02/11/2020  Hearing? Y Y  Comment - has heairng aids  Vision? N N  Comment - wears glasses  Difficulty concentrating or making decisions? N N  Walking or climbing stairs? N N  Dressing or bathing? N N  Doing errands, shopping? N N  Preparing Food and eating ? N N  Using the Toilet? N N  In the past six months, have you accidently leaked urine? N N  Do you have problems with loss of bowel control? N N  Managing your Medications? N N  Managing your Finances? N N  Housekeeping or managing your Housekeeping? N N  Some recent data might be hidden    Patient Care Team: Janora Norlander, DO as PCP - General (Family Medicine) Harl Bowie Alphonse Guild, MD as PCP - Cardiology (Cardiology) Gala Romney Cristopher Estimable, MD as Consulting Physician (Gastroenterology) Ilean China, RN as Case Manager [place], Royce Macadamia, [facility] (Pharmacist)  Indicate any recent Medical Services you may have received from other than [facility] providers in the past year (date may be approximate).     Assessment:   This is a routine wellness examination for Melissa Schneider.  Hearing/Vision screen Hearing  Screening - Comments:: Wears hearing aids - f/u with [facility] in [place] - Comments:: Wears eyeglasses - up to date with annual eye exam at [facility] in [place] issues and exercise activities discussed: Current Exercise Habits: Home exercise routine, Type of exercise: walking;Other - see comments (ski machine), Time (  Minutes): 50, Frequency (Times/Week): 5, Weekly Exercise (Minutes/Week): 250, Intensity: Mild, Exercise limited by: None identified   Goals Addressed             This Visit's Progress    Exercise 3x per week (30 min per time)   On track    Try to exercise for at least 30 minutes, 3 times weekly        Depression Screen PHQ 2/9 Scores 01/19/2021 10/26/2020 01/22/2020 01/19/2020 07/28/2019 01/12/2019 09/01/2018  PHQ - 2 Score 0 0 0 0 0 0 0  PHQ- 9 Score - - 0 - 0 - 2    Fall Risk Fall Risk  01/19/2021 10/26/2020 02/11/2020 02/11/2020 01/22/2020  Falls in the past year? 1 1 (No Data) 1 0  Comment - - tripped over a limb while pushing trash can. no injury - -  Number falls in past yr: 0 1 - 0 0  Injury with Fall? 0 0 - 0 0  Risk for fall due to : History of fall(s);Impaired vision History of fall(s) - - -  Follow up Falls prevention discussed - - Falls prevention discussed Falls evaluation completed    FALL RISK PREVENTION PERTAINING TO THE HOME:  Any stairs in or around the home? Yes  If so, are there any without handrails? No  Home free of loose throw rugs in walkways, pet beds, electrical cords, etc? Yes  Adequate lighting in your home to reduce risk of falls? Yes   ASSISTIVE DEVICES UTILIZED TO PREVENT FALLS:  Life alert? No  Use of a cane, walker or w/c? No  Grab bars in the bathroom? Yes  Shower chair or bench in shower? Yes  Elevated toilet seat or a handicapped toilet? Yes   TIMED UP AND GO:  Was the test performed? No . Telephonic visit  Cognitive Function: Normal cognitive status assessed by direct observation by this Nurse Health  Advisor. No abnormalities found.       6CIT Screen 01/19/2020 01/12/2019  What Year? 0 points 0 points  What month? 0 points 0 points  What time? 0 points 0 points  Count back from 20 2 points 0 points  Months in reverse 0 points 0 points  Repeat phrase 0 points 0 points  Total Score 2 0    Immunizations Immunization History  Administered Date(s) Administered   Influenza Whole 05/16/2010   Influenza,inj,Quad PF,6+ Mos 05/10/2015, 04/26/2016, 06/19/2017, 05/28/2018   Pneumococcal Conjugate-13 01/29/2014   Pneumococcal Polysaccharide-23 07/16/2008, 01/30/2016   Td 07/17/2007   Tdap 07/28/2019    TDAP status: Up to date  Flu Vaccine status: Due, Education has been provided regarding the importance of this vaccine. Advised may receive this vaccine at local pharmacy or Health Dept. Aware to provide a copy of the vaccination record if obtained from local pharmacy or Health Dept. Verbalized acceptance and understanding.  Pneumococcal vaccine status: Up to date  Covid-19 vaccine status: Declined, Education has been provided regarding the importance of this vaccine but patient still declined. Advised may receive this vaccine at local pharmacy or Health Dept.or vaccine clinic. Aware to provide a copy of the vaccination record if obtained from local pharmacy or Health Dept. Verbalized acceptance and understanding.  Qualifies for Shingles Vaccine? Yes   Zostavax completed No   Shingrix Completed?: No.    Education has been provided regarding the importance of this vaccine. Patient has been advised to call insurance company to determine out of pocket expense if they have not yet received this  vaccine. Advised may also receive vaccine at local pharmacy or Health Dept. Verbalized acceptance and understanding.  Screening Tests Health Maintenance  Topic Date Due   Zoster Vaccines- Shingrix (1 of 2) Never done   MAMMOGRAM  08/25/2020   OPHTHALMOLOGY EXAM  01/18/2021   COLONOSCOPY (Pts 45-31yrs  Insurance coverage will need to be confirmed)  10/26/2021 (Originally 01/09/2017)   HIV Screening  10/26/2021 (Originally 10/23/1970)   COVID-19 Vaccine (1) 11/11/2021 (Originally 10/22/1960)   INFLUENZA VACCINE  02/13/2021   HEMOGLOBIN A1C  04/08/2021   FOOT EXAM  10/26/2021   PAP SMEAR-Modifier  10/26/2025   TETANUS/TDAP  07/27/2029   Hepatitis C Screening  Completed   Pneumococcal Vaccine 88-45 Years old  Aged Out   HPV [place] Maintenance Due  Topic Date Due   Zoster Vaccines- Shingrix (1 of 2) Never done   MAMMOGRAM  08/25/2020   OPHTHALMOLOGY EXAM  01/18/2021    Colorectal cancer screening: Type of screening: Colonoscopy. Completed 01/10/2012. Repeat every 5 years  Mammogram status: Completed 08/26/2019. Repeat every year ordered repeat today  Bone Density Screening: Due next year  Lung Cancer Screening: (Low Dose CT Chest recommended if Age 76-80 years, 30 pack-year currently smoking OR have quit w/in 15years.) does not qualify.   Additional Screening:  Hepatitis C Screening: does qualify; Completed 01/30/2016  Vision Screening: Recommended annual ophthalmology exams for early detection of glaucoma and other disorders of the eye. Is the patient up to date with their annual eye exam?  Yes  Who is the provider or what is the name of the office in which the patient attends annual eye exams? [place] If pt is not established with a provider, would they like to be referred to a provider to establish care? No .   Dental Screening: Recommended annual dental exams for proper oral hygiene  Community Resource Referral / Chronic Care Management: CRR required this visit?  No   CCM required this visit?  No      Plan:     I have personally reviewed and noted the following in the patient's chart:   Medical and social history Use of alcohol, tobacco or illicit drugs  Current medications and supplements including opioid  prescriptions.  Functional ability and status Nutritional status Physical activity Advanced directives List of other physicians Hospitalizations, surgeries, and ER visits in previous 12 months Vitals Screenings to include cognitive, depression, and falls Referrals and appointments  In addition, I have reviewed and discussed with patient certain preventive protocols, quality metrics, and best practice recommendations. A written personalized care plan for preventive services as well as general preventive health recommendations were provided to patient.     Sandrea Hammond, LPN   03/21/7892   Nurse Notes: None

## 2021-01-20 ENCOUNTER — Other Ambulatory Visit: Payer: Self-pay | Admitting: Family Medicine

## 2021-01-20 DIAGNOSIS — IMO0002 Reserved for concepts with insufficient information to code with codable children: Secondary | ICD-10-CM

## 2021-01-20 DIAGNOSIS — E1165 Type 2 diabetes mellitus with hyperglycemia: Secondary | ICD-10-CM

## 2021-01-26 ENCOUNTER — Ambulatory Visit: Payer: PPO | Admitting: Gastroenterology

## 2021-01-26 ENCOUNTER — Encounter: Payer: Self-pay | Admitting: Internal Medicine

## 2021-02-09 ENCOUNTER — Telehealth: Payer: PPO | Admitting: *Deleted

## 2021-02-23 ENCOUNTER — Other Ambulatory Visit: Payer: Self-pay | Admitting: Family Medicine

## 2021-02-23 DIAGNOSIS — IMO0002 Reserved for concepts with insufficient information to code with codable children: Secondary | ICD-10-CM

## 2021-02-23 DIAGNOSIS — E1165 Type 2 diabetes mellitus with hyperglycemia: Secondary | ICD-10-CM

## 2021-03-01 ENCOUNTER — Ambulatory Visit (INDEPENDENT_AMBULATORY_CARE_PROVIDER_SITE_OTHER): Payer: PPO | Admitting: *Deleted

## 2021-03-01 ENCOUNTER — Encounter: Payer: Self-pay | Admitting: *Deleted

## 2021-03-01 DIAGNOSIS — E1122 Type 2 diabetes mellitus with diabetic chronic kidney disease: Secondary | ICD-10-CM | POA: Diagnosis not present

## 2021-03-01 DIAGNOSIS — R4589 Other symptoms and signs involving emotional state: Secondary | ICD-10-CM | POA: Diagnosis not present

## 2021-03-01 DIAGNOSIS — N183 Chronic kidney disease, stage 3 unspecified: Secondary | ICD-10-CM | POA: Diagnosis not present

## 2021-03-06 NOTE — Patient Instructions (Signed)
Visit Information  PATIENT GOALS:  Goals Addressed               This Visit's Progress     Patient Stated     Monitor and Manage My Blood Sugar (pt-stated)   On track     Timeframe:  Long-Range Goal Priority:  Medium Start Date: 08/22/20                            Expected End Date: 02/18/21                      Follow-up 04/04/21  Continue to check blood sugar using CGM before meals and at bedtime and as needed if you feel that it is too high or too low enter blood sugar readings and medication or insulin into daily log take the blood sugar log to all doctor visits take the blood sugar meter to all doctor visits  Call PCP [phone] with any readings outside of recommended range Make healthy meal choices Eat meals at regular intervals  Make sure to eat if you are going to take your insulin         Patient verbalizes understanding of instructions provided today and agrees to view in [place].     Plan:Telephone follow up appointment with care management team member scheduled for:  04/04/21 with RNCM and The patient has been provided with contact information for the care management team and has been advised to call with any health related questions or concerns.   Chong Sicilian, BSN, RN-BC Embedded Chronic Care Manager [facility] [place] Management Direct Dial: [phone]

## 2021-03-06 NOTE — Chronic Care Management (AMB) (Signed)
Chronic Care Management   CCM RN Visit Note  03/01/2021 Name: Melissa Schneider MRN: [id number] DOB: 1956-03-30  Subjective: Melissa Schneider is a 65 y.o. year old female who is a primary care patient of Janora Norlander, DO. The care management team was consulted for assistance with disease management and care coordination needs.    Engaged with patient by telephone for follow up visit in response to provider referral for case management and/or care coordination services.   Consent to Services:  The patient was given information about Chronic Care Management services, agreed to services, and gave verbal consent prior to initiation of services.  Please see initial visit note for detailed documentation.   Patient agreed to services and verbal consent obtained.   Assessment: Review of patient past medical history, allergies, medications, health status, including review of consultants reports, laboratory and other test data, was performed as part of comprehensive evaluation and provision of chronic care management services.   SDOH (Social Determinants of Health) assessments and interventions performed:    CCM Care Plan  Allergies  Allergen Reactions   Codeine Nausea Only and Other (See Comments)    NAUSEA AND CAUSES HER TO HAVE BAD DREAMS   Gabapentin     Confusion    Lisinopril     Coughing    Outpatient Encounter Medications as of 03/01/2021  Medication Sig   aspirin 81 MG tablet Take 81 mg by mouth daily.   atorvastatin (LIPITOR) 40 MG tablet Take 40 mg by mouth daily.   Continuous Blood Gluc Receiver (FREESTYLE LIBRE 2 READER) DEVI Use to test blood sugar up to 6 times daily. DX: E11.9   Continuous Blood Gluc Sensor (FREESTYLE LIBRE 2 SENSOR) MISC Use to test blood sugar up to 6 times daily. DX: E11.9   docusate sodium (COLACE) 100 MG capsule Take 200 mg by mouth daily as needed.    glucose blood (GNP EASY TOUCH GLUCOSE TEST) test strip EVERY DAY   Insulin Pen Needle (PEN  NEEDLES) 32G X 4 MM MISC 1 each by Does not apply route 4 (four) times daily.   Insulin Syringe-Needle U-100 (TRUEPLUS INSULIN SYRINGE) 31G X 5/16" 1 ML MISC AT BEDTIME Dx E11.8   LANTUS 100 UNIT/ML injection INJECT 0.22-0.3 ML SQ AT BEDTIME (22-30 UNITS TOTAL)   levothyroxine (SYNTHROID) 137 MCG tablet TAKE ONE TABLET EACH MORNING BEFORE BREAKFAST   losartan (COZAAR) 25 MG tablet TAKE ONE (1) TABLET EACH DAY   metFORMIN (GLUCOPHAGE) 500 MG tablet Take 2 tablets (1,000 mg total) by mouth daily with breakfast. (Patient not taking: Reported on 10/26/2020)   Multiple Vitamin (MULTIVITAMIN WITH MINERALS) TABS tablet Take 1 tablet by mouth daily.   NOVOLOG FLEXPEN 100 UNIT/ML FlexPen INJECT 10-17 UNITS SQ 3 TIMES DAILY WITHMEALS   SUMAtriptan (IMITREX) 100 MG tablet TAKE 1 TABLET AT ONSET OF MIGRAINE HEADACHE MAY REPEAT ONCE IN 2 HOURS (MAX OF 2 [id number] HOURS)   TRULICITY 1.5 0000000 SOPN INJECT 1.'5MG'$  SQ ONCE A WEEK   No facility-administered encounter medications on file as of 03/01/2021.    Patient Active Problem List   Diagnosis Date Noted   Bilateral impacted cerumen 01/22/2020   Hyperlipidemia associated with type 2 diabetes mellitus ([place]) 09/01/2018   Depressed mood 09/01/2018   Low back pain 04/26/2016   Hearing problem of both ears 01/30/2016   Thyroid activity decreased 11/07/2015   Chest pain 08/11/2015   BMI 35.0-35.9,adult 08/11/2015   Hyperlipidemia 06/16/2015   Migraine 03/17/2015   Diabetic neuropathy (  [place]) 03/17/2015   Constipation 04/05/2014   Microcytic anemia 04/05/2014   Recurrent obstructive pyelonephritis 08/14/2013   Leukocytosis, unspecified 08/13/2013   Obstructive uropathy 08/13/2013   Stone in kidney 08/13/2013   Diabetic gastroparesis ([place]) 07/23/2011   Hypothyroidism 08/24/2010   DM (diabetes mellitus), type 2, uncontrolled ([place]) 08/24/2010    Conditions to be addressed/monitored:DMII, Anxiety, and Depression  Care Plan : [place]  Updates made  by Ilean China, RN since 03/06/2021 12:00 AM     Problem: Chronic Disease Management Needs   Priority: Medium     Long-Range Goal: Work with [facility] Regarding Care Management and Care Coordination Associated with Diabets, Anxiety, and Depression   This Visit's Progress: On track  Priority: Medium  Note:   Current Barriers:  Chronic Disease Management support and education needs related to DMII and Anxiety with Excessive Worry, and Depression: depressed mood,  RNCM Clinical Goal(s):  Patient will take all medications exactly as prescribed and will call provider for medication related questions continue to work with RN Care Manager to address care management and care coordination needs related to DMII  through collaboration with RN Care manager, provider, and care team.   Interventions: 1:1 collaboration with primary care provider regarding development and update of comprehensive plan of care as evidenced by provider attestation and co-signature Inter-disciplinary care team collaboration (see longitudinal plan of care) Evaluation of current treatment plan related to  self management and patient's adherence to plan as established by provider   Diabetes:  (Status: Goal on track: YES.) Lab Results  Component Value Date   HGBA1C 7.0 (H) 10/06/2020  Assessed patient's understanding of A1c goal: <7% Chart reviewed including relevant office notes and lab results Discussed improvement in A1C and provided praise and encouragement Reviewed and discussed medications and insulin injection schedule Discussed continuous glucose monitor Patient is doing well with no complaints Discussed home blood sugar readings Average has improved. No recent low readings.  Discussed diet Encouraged patient to make healthy meal choices and to eat at regular intervals Reviewed upcoming appointments  Asked patient to reach out to PCP with any readings outside of recommended range Previously provided with [place] telephone number and encouraged to reach out as needed  Patient Goals/Self-Care Activities: Patient will self administer medications as prescribed Patient will call pharmacy for medication refills Patient will continue to perform ADL's independently Patient will continue to perform IADL's independently Patient will call provider office for new concerns or questions       Plan:Telephone follow up appointment with care management team member scheduled for:  04/04/21 with RNCM and The patient has been provided with contact information for the care management team and has been advised to call with any health related questions or concerns.   Chong Sicilian, BSN, RN-BC Embedded Chronic Care Manager [facility] [place] [facility] Direct Dial: [phone]

## 2021-03-21 ENCOUNTER — Other Ambulatory Visit: Payer: Self-pay | Admitting: Family

## 2021-03-21 DIAGNOSIS — Z1231 Encounter for screening mammogram for malignant neoplasm of breast: Secondary | ICD-10-CM

## 2021-03-24 ENCOUNTER — Other Ambulatory Visit: Payer: Self-pay | Admitting: Family Medicine

## 2021-03-24 DIAGNOSIS — IMO0002 Reserved for concepts with insufficient information to code with codable children: Secondary | ICD-10-CM

## 2021-03-24 DIAGNOSIS — E1165 Type 2 diabetes mellitus with hyperglycemia: Secondary | ICD-10-CM

## 2021-03-27 ENCOUNTER — Ambulatory Visit
Admission: RE | Admit: 2021-03-27 | Discharge: 2021-03-27 | Disposition: A | Discharge status: Home / Self Care | Payer: PPO | Source: Ambulatory Visit | Attending: Family Medicine | Admitting: Family Medicine

## 2021-03-27 ENCOUNTER — Other Ambulatory Visit: Payer: Self-pay

## 2021-03-27 DIAGNOSIS — Z1231 Encounter for screening mammogram for malignant neoplasm of breast: Secondary | ICD-10-CM

## 2021-03-28 ENCOUNTER — Encounter: Payer: Self-pay | Admitting: Cardiology

## 2021-03-28 ENCOUNTER — Ambulatory Visit (INDEPENDENT_AMBULATORY_CARE_PROVIDER_SITE_OTHER): Payer: PPO | Admitting: Cardiology

## 2021-03-28 VITALS — BP 133/82 | HR 69 | Ht 62.0 in | Wt 194.2 lb

## 2021-03-28 DIAGNOSIS — E782 Mixed hyperlipidemia: Secondary | ICD-10-CM

## 2021-03-28 DIAGNOSIS — R0789 Other chest pain: Secondary | ICD-10-CM

## 2021-03-28 DIAGNOSIS — I10 Essential (primary) hypertension: Secondary | ICD-10-CM

## 2021-03-28 NOTE — Progress Notes (Signed)
Clinical Summary Melissa Schneider is a 65 y.o.female seen today for follow up of the following medical problems.   1. Chest pain - started off and on x 10 years ago. Squeezing like feeling left sided, 4-5/10. Often occurs at rest. No other associated symptoms. Not positional, but can feel better with deep breaths. Lasts up to 1 hour. Occurs approx once a week. No relation to food. Can have some dysphagia at times that is separate. Has had some increase in frequency, stable severity - can get some DOE, example walking up stairs which is new, though she does report 40 lbs weight gain over the last year. - no LE edema, no orthopnea, no PND.   CAD risk factors: DM2, HL, brother MI age 46.       - 01/2017 nuclear stress no ischemia.    08/2019 coronary CTA: minimal CAD, coronary CA score 1, no significant disease.  - still with chest pains at times. Can have some acid like regurgitation in her mouth. Usually occurs at night, better w/ tums  - no recent chest pains.       2. HTN - compliant with meds   3. Hyperlipidemia - compliant with statin. Labs followed by pcp   - in 01/2020 we increased atorva to '40mg'$  daily  - 09/2020 TC 210 TG 57 HDL 81 LDL 119   Past Medical History:  Diagnosis Date   Anemia 07/23/2011   Anxiety    Diabetes mellitus    Diabetic gastroparesis ([place]) 07/23/2011   Suspected   Headache(784.0)    Hearing difficulty    Hyperlipidemia    Hypothyroidism    Kidney stones    Microcytic anemia 07/23/2011   Thalassemia minor    ????? not really worked up per pt but daughter has it???     Allergies  Allergen Reactions   Codeine Nausea Only and Other (See Comments)    NAUSEA AND CAUSES HER TO HAVE BAD DREAMS   Gabapentin     Confusion    Lisinopril     Coughing     Current Outpatient Medications  Medication Sig Dispense Refill   aspirin 81 MG tablet Take 81 mg by mouth daily.     atorvastatin (LIPITOR) 40 MG tablet Take 40 mg by mouth daily.      Continuous Blood Gluc Receiver (FREESTYLE LIBRE 2 READER) DEVI Use to test blood sugar up to 6 times daily. DX: E11.9 1 each 0   Continuous Blood Gluc Sensor (FREESTYLE LIBRE 2 SENSOR) MISC Use to test blood sugar up to 6 times daily. DX: E11.9 2 each 11   docusate sodium (COLACE) 100 MG capsule Take 200 mg by mouth daily as needed.      glucose blood (GNP EASY TOUCH GLUCOSE TEST) test strip EVERY DAY 100 each 11   insulin glargine (LANTUS) 100 UNIT/ML injection INJECT 0.22-0.3 ML SQ AT BEDTIME (22-30 UNITS TOTAL) 10 mL 0   Insulin Pen Needle (PEN NEEDLES) 32G X 4 MM MISC 1 each by Does not apply route 4 (four) times daily. 100 each 12   Insulin Syringe-Needle U-100 (TRUEPLUS INSULIN SYRINGE) 31G X 5/16" 1 ML MISC AT BEDTIME Dx E11.8 90 each 3   levothyroxine (SYNTHROID) 137 MCG tablet TAKE ONE TABLET EACH MORNING BEFORE BREAKFAST 30 tablet 5   losartan (COZAAR) 25 MG tablet TAKE ONE (1) TABLET EACH DAY 90 tablet 1   metFORMIN (GLUCOPHAGE) 500 MG tablet Take 2 tablets (1,000 mg total) by mouth daily  with breakfast. (Patient not taking: Reported on 10/26/2020) 180 tablet 1   Multiple Vitamin (MULTIVITAMIN WITH MINERALS) TABS tablet Take 1 tablet by mouth daily.     NOVOLOG FLEXPEN 100 UNIT/ML FlexPen INJECT 10-17 UNITS SQ 3 TIMES DAILY WITHMEALS 15 mL 0   SUMAtriptan (IMITREX) 100 MG tablet TAKE 1 TABLET AT ONSET OF MIGRAINE HEADACHE MAY REPEAT ONCE IN 2 HOURS (MAX OF 2 TABLETS/24 HOURS) 9 tablet 0   TRULICITY 1.5 0000000 SOPN INJECT 1.'5MG'$  SQ ONCE A WEEK 6 mL 0   No current facility-administered medications for this visit.     Past Surgical History:  Procedure Laterality Date   COLONOSCOPY  2004   Dr. Jim Desanctis: small fissue and hemorrhoids   COLONOSCOPY  12/2011   Dr. Paulita Fujita: normal   CYSTOSCOPY W/ URETERAL STENT PLACEMENT Right 08/13/2013   Procedure: CYSTOSCOPY WITH RETROGRADE PYELOGRAM/URETERAL STENT PLACEMENT;  Surgeon: Marissa Nestle, MD;  Location: [facility];  Service: Urology;   Laterality: Right;   EXTRACORPOREAL SHOCK WAVE LITHOTRIPSY Right 08/19/2013   Procedure: EXTRACORPOREAL SHOCK WAVE LITHOTRIPSY (ESWL) RIGHT URETERAL CALCULUS;  Surgeon: Marissa Nestle, MD;  Location: [facility];  Service: Urology;  Laterality: Right;   WRIST SURGERY Right      Allergies  Allergen Reactions   Codeine Nausea Only and Other (See Comments)    NAUSEA AND CAUSES HER TO HAVE BAD DREAMS   Gabapentin     Confusion    Lisinopril     Coughing      Family History  Problem Relation Age of Onset   Colon cancer Maternal Grandmother        age 45   Colon polyps Mother    CAD Brother    Rheum arthritis Sister      Social History Melissa Schneider reports that she has never smoked. She has never used smokeless tobacco. Melissa Schneider reports no history of alcohol use.   Review of Systems CONSTITUTIONAL: No weight loss, fever, chills, weakness or fatigue.  HEENT: Eyes: No visual loss, blurred vision, double vision or yellow sclerae.No hearing loss, sneezing, congestion, runny nose or sore throat.  SKIN: No rash or itching.  CARDIOVASCULAR: per hpi RESPIRATORY: No shortness of breath, cough or sputum.  GASTROINTESTINAL: No anorexia, nausea, vomiting or diarrhea. No abdominal pain or blood.  GENITOURINARY: No burning on urination, no polyuria NEUROLOGICAL: No headache, dizziness, syncope, paralysis, ataxia, numbness or tingling in the extremities. No change in bowel or bladder control.  MUSCULOSKELETAL: No muscle, back pain, joint pain or stiffness.  LYMPHATICS: No enlarged nodes. No history of splenectomy.  PSYCHIATRIC: No history of depression or anxiety.  ENDOCRINOLOGIC: No reports of sweating, cold or heat intolerance. No polyuria or polydipsia.  Marland Kitchen   Physical Examination Today's Vitals   03/28/21 1105  BP: 133/82  Pulse: 69  SpO2: 97%  Weight: 194 lb 3.2 oz (88.1 kg)  Height: '5\' 2"'$  (1.575 m)   Body mass index is 35.52 kg/m.  Gen: resting comfortably, no acute  distress HEENT: no scleral icterus, pupils equal round and reactive, no palptable cervical adenopathy,  CV: RRR, no m//rg no jvd Resp: Clear to auscultation bilaterally GI: abdomen is soft, non-tender, non-distended, normal bowel sounds, no hepatosplenomegaly MSK: extremities are warm, no edema.  Skin: warm, no rash Neuro:  no focal deficits Psych: appropriate affect   Diagnostic Studies  08/2015 GXT There was no ST segment deviation noted during stress. No T wave inversion was noted during stress. Motion artifact No definite ST changes  to suggest ischemia    Finalized by Fay Records, MD on Mon Jan 23, 2016  3:32 PM  There was no ST segment deviation noted during stress. No T wave inversion was noted during stress. Pressure rate product is 20, 701. Test at marginal workload For heart rate achieved, there were no ischemic EKG changes Conclusion limited.        08/2015 echo Study Conclusions   - Left ventricle: The cavity size was normal. Wall thickness was at   the upper limits of normal. Systolic function was normal. The   estimated ejection fraction was in the range of 60% to 65%. Wall   motion was normal; there were no regional wall motion   abnormalities. Left ventricular diastolic function parameters   were normal. - Ascending aorta: The ascending aorta was mildly ectatic. - Mitral valve: There was trivial regurgitation. - Right atrium: Central venous pressure (est): 3 mm Hg. - Tricuspid valve: There was trivial regurgitation. - Pulmonary arteries: PA peak pressure: 23 mm Hg (S). - Pericardium, extracardiac: There was no pericardial effusion.   Impressions:   - Upper normal LV wall thickness with LVEF 60-65% and grossly   normal diastolic function. Mildly ectatic ascending aorta.   Trivial mitral and tricuspid regurgitation. Estimated PASP 23   mmHg.     01/2017 nuclear stress No diagnostic ST segment changes to indicate ischemia. Low risk Duke treadmill score  4.5. Blood pressure demonstrated a hypertensive response to exercise. Small, mild intensity, fixed anteroapical defect consistent with breast attenuation. This is a low risk study. Nuclear stress EF: 76%.     08/2019 coronary CTA IMPRESSION: 1. Minimal CAD, CADRADS = 1.   2. The patient's coronary artery calcium score is 10, which places the patient in the 67th percentile for age and sex matched controls.   3. Normal coronary origin with right dominance.    Assessment and Plan   1. Chest pain - negative cardiac CT for significant disease, her symptoms are not cardiac - no recent symptoms, if recurrent would start daily antacid EKG today shows NSR, no ischemic changes   2. HTN - at goal, continue current meds     3. Hyperlipidemia -continue atorvastatin     Arnoldo Lenis, M.D.

## 2021-03-28 NOTE — Patient Instructions (Addendum)
Medication Instructions:  Continue all current medications.  Labwork: none  Testing/Procedures: none  Follow-Up: 1 year   Any Other Special Instructions Will Be Listed Below (If Applicable).  If you need a refill on your cardiac medications before your next appointment, please call your pharmacy.  

## 2021-04-21 ENCOUNTER — Other Ambulatory Visit: Payer: Self-pay | Admitting: Family Medicine

## 2021-04-27 ENCOUNTER — Other Ambulatory Visit: Payer: Self-pay

## 2021-04-27 ENCOUNTER — Encounter: Payer: Self-pay | Admitting: Family Medicine

## 2021-04-27 ENCOUNTER — Ambulatory Visit (INDEPENDENT_AMBULATORY_CARE_PROVIDER_SITE_OTHER): Payer: PPO | Admitting: Family Medicine

## 2021-04-27 DIAGNOSIS — E039 Hypothyroidism, unspecified: Secondary | ICD-10-CM

## 2021-04-27 DIAGNOSIS — M62830 Muscle spasm of back: Secondary | ICD-10-CM

## 2021-04-27 DIAGNOSIS — E1122 Type 2 diabetes mellitus with diabetic chronic kidney disease: Secondary | ICD-10-CM | POA: Diagnosis not present

## 2021-04-27 DIAGNOSIS — N183 Chronic kidney disease, stage 3 unspecified: Secondary | ICD-10-CM | POA: Diagnosis not present

## 2021-04-27 DIAGNOSIS — I152 Hypertension secondary to endocrine disorders: Secondary | ICD-10-CM | POA: Diagnosis not present

## 2021-04-27 DIAGNOSIS — Z794 Long term (current) use of insulin: Secondary | ICD-10-CM | POA: Diagnosis not present

## 2021-04-27 DIAGNOSIS — E1159 Type 2 diabetes mellitus with other circulatory complications: Secondary | ICD-10-CM

## 2021-04-27 DIAGNOSIS — E1169 Type 2 diabetes mellitus with other specified complication: Secondary | ICD-10-CM

## 2021-04-27 DIAGNOSIS — E785 Hyperlipidemia, unspecified: Secondary | ICD-10-CM | POA: Diagnosis not present

## 2021-04-27 LAB — BAYER DCA HB A1C WAIVED: HB A1C (BAYER DCA - WAIVED): 7 % — ABNORMAL HIGH (ref 4.8–5.6)

## 2021-04-27 MED ORDER — PEN NEEDLES 32G X 4 MM MISC: 3 refills | Status: DC

## 2021-04-27 MED ORDER — TRULICITY 1.5 MG/0.5ML ~~LOC~~ SOAJ: SUBCUTANEOUS | 3 refills | Status: DC

## 2021-04-27 MED ORDER — INSULIN GLARGINE 100 UNIT/ML ~~LOC~~ SOLN: 22.0000 [IU] | Freq: Every day | SUBCUTANEOUS | 3 refills | Status: DC

## 2021-04-27 MED ORDER — NOVOLOG FLEXPEN 100 UNIT/ML ~~LOC~~ SOPN: PEN_INJECTOR | SUBCUTANEOUS | 3 refills | Status: DC

## 2021-04-27 MED ORDER — LOSARTAN POTASSIUM 25 MG PO TABS: ORAL_TABLET | ORAL | 3 refills | Status: DC

## 2021-04-27 MED ORDER — SUMATRIPTAN SUCCINATE 100 MG PO TABS: ORAL_TABLET | ORAL | 12 refills | Status: DC

## 2021-04-27 MED ORDER — TIZANIDINE HCL 4 MG PO TABS: 2.0000 mg | ORAL_TABLET | Freq: Three times a day (TID) | ORAL | 0 refills | Status: DC | PRN

## 2021-04-27 MED ORDER — "INSULIN SYRINGE 31G X 5/16"" 0.5 ML MISC": 3 refills | Status: DC

## 2021-04-27 MED ORDER — LEVOTHYROXINE SODIUM 137 MCG PO TABS: ORAL_TABLET | ORAL | 3 refills | Status: DC

## 2021-04-27 MED ORDER — INSULIN GLARGINE 100 UNIT/ML ~~LOC~~ SOLN: 10.0000 [IU] | Freq: Every day | SUBCUTANEOUS | 3 refills | Status: DC

## 2021-04-27 MED ORDER — ATORVASTATIN CALCIUM 40 MG PO TABS: 40.0000 mg | ORAL_TABLET | Freq: Every day | ORAL | 3 refills | Status: DC

## 2021-04-27 NOTE — Progress Notes (Signed)
Subjective: CC: DM PCP: Janora Norlander, DO Melissa Schneider is a 65 y.o. female presenting to clinic today for:  1. Type 2 Diabetes with hypertension, hyperlipidemia:  Uses freestyle libre monitor.  Compliant with Trulicity 1.5 mg subcu each week, NovoLog 10 units 3 times daily with meals, 10 units of Lantus nightly.  Compliant with losartan 25 mg, Lipitor 40 mg.  Last eye exam: Up-to-date Last foot exam: Needs Last A1c:  Lab Results  Component Value Date   HGBA1C 7.0 (H) 10/06/2020   Nephropathy screen indicated?:  On ARB Last flu, zoster and/or pneumovax:  Immunization History  Administered Date(s) Administered   Influenza Whole 05/16/2010   Influenza,inj,Quad PF,6+ Mos 05/10/2015, 04/26/2016, 06/19/2017, 05/28/2018   Pneumococcal Conjugate-13 01/29/2014   Pneumococcal Polysaccharide-23 07/16/2008, 01/30/2016   Td 07/17/2007   Tdap 07/28/2019    ROS: No chest pain, shortness of breath, neuropathy or ulcers reported  2.Hypothyroidism Patient is compliant with Synthroid 137 mcg daily.  No change in voice, difficulty swallowing, tremor  3.  Back pain Patient reports acute onset of back pain over the last couple of days on the right side.  She notes that prior to this she had actually injured her left heel and had some altered gait for a few days before that resolved.  Pain is somewhat relieved by the Aleve and topical Voltaren gel she has been utilizing.  No reports of dysuria or hematuria.  No reports of fever   ROS: Per HPI  Allergies  Allergen Reactions   Codeine Nausea Only and Other (See Comments)    NAUSEA AND CAUSES HER TO HAVE BAD DREAMS   Gabapentin     Confusion    Lisinopril     Coughing   Past Medical History:  Diagnosis Date   Anemia 07/23/2011   Anxiety    Diabetes mellitus    Diabetic gastroparesis ([place]) 07/23/2011   Suspected   Headache(784.0)    Hearing difficulty    Hyperlipidemia    Hypothyroidism    Kidney stones    Microcytic  anemia 07/23/2011   Thalassemia minor    ????? not really worked up per pt but daughter has it???    Current Outpatient Medications:    aspirin 81 MG tablet, Take 81 mg by mouth daily., Disp: , Rfl:    atorvastatin (LIPITOR) 40 MG tablet, Take 40 mg by mouth daily., Disp: , Rfl:    Continuous Blood Gluc Receiver (FREESTYLE LIBRE 2 READER) DEVI, Use to test blood sugar up to 6 times daily. DX: E11.9, Disp: 1 each, Rfl: 0   Continuous Blood Gluc Sensor (FREESTYLE LIBRE 2 SENSOR) MISC, USE TO TEST BLOOD SUGAR UP TO 6 TIMES DAILY, Disp: 2 each, Rfl: 11   docusate sodium (COLACE) 100 MG capsule, Take 200 mg by mouth daily as needed. , Disp: , Rfl:    glucose blood (GNP EASY TOUCH GLUCOSE TEST) test strip, EVERY DAY, Disp: 100 each, Rfl: 11   insulin glargine (LANTUS) 100 UNIT/ML injection, Inject 0.22-0.3 ML SQ at bedtime (15 or less units Total), Disp: , Rfl:    Insulin Pen Needle (PEN NEEDLES) 32G X 4 MM MISC, 1 each by Does not apply route 4 (four) times daily., Disp: 100 each, Rfl: 12   Insulin Syringe-Needle U-100 (TRUEPLUS INSULIN SYRINGE) 31G X 5/16" 1 ML MISC, AT BEDTIME Dx E11.8, Disp: 90 each, Rfl: 3   levothyroxine (SYNTHROID) 137 MCG tablet, TAKE ONE TABLET EACH MORNING BEFORE BREAKFAST, Disp: 30 tablet, Rfl: 5  losartan (COZAAR) 25 MG tablet, TAKE ONE (1) TABLET EACH DAY, Disp: 90 tablet, Rfl: 1   Multiple Vitamin (MULTIVITAMIN WITH MINERALS) TABS tablet, Take 1 tablet by mouth daily., Disp: , Rfl:    NOVOLOG FLEXPEN 100 UNIT/ML FlexPen, INJECT 10-17 UNITS SQ 3 TIMES DAILY WITHMEALS, Disp: 15 mL, Rfl: 0   SUMAtriptan (IMITREX) 100 MG tablet, TAKE 1 TABLET AT ONSET OF MIGRAINE HEADACHE MAY REPEAT ONCE IN 2 HOURS (MAX OF 2 TABLETS/24 HOURS), Disp: 9 tablet, Rfl: 0   TRULICITY 1.5 GG/2.6RS SOPN, INJECT 1.5MG  SQ ONCE A WEEK, Disp: 6 mL, Rfl: 0 Social History   Socioeconomic History   Marital status: Married    Spouse name: [name]   Number of children: 1   Years of education: Not on  file   Highest education level: Some college, no degree  Occupational History   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   Occupation: Retired  Tobacco Use   Smoking status: Never   Smokeless tobacco: Never   Tobacco comments:    Never smoked  Vaping Use   Vaping Use: Never used  Substance and Sexual Activity   Alcohol use: No    Alcohol/week: 0.0 standard drinks   Drug use: No   Sexual activity: Not Currently  Other Topics Concern   Not on file  Social History Narrative   Lives home with husband - Daughter lives in [place] - she has a great grandchild also   Social Determinants of Radio broadcast assistant Strain: Low Risk    Difficulty of Paying Living Expenses: Not very hard  Food Insecurity: No Food Insecurity   Worried About Charity fundraiser in the Last Year: Never true   Arboriculturist in the Last Year: Never true  Transportation Needs: No Transportation Needs   Lack of Transportation (Medical): No   Lack of Transportation (Non-Medical): No  Physical Activity: Sufficiently Active   Days of Exercise per Week: 7 days   Minutes of Exercise per Session: 50 min  Stress: No Stress Concern Present   Feeling of Stress : Not at all  Social Connections: Socially Integrated   Frequency of Communication with Friends and Family: More than three times a week   Frequency of Social Gatherings with Friends and Family: More than three times a week   Attends Religious Services: More than 4 times per year   Active Member of [employer] or Organizations: Yes   Attends Music therapist: More than 4 times per year   Marital Status: Married  Human resources officer Violence: Not At Risk   Fear of Current or Ex-Partner: No   Emotionally Abused: No   Physically Abused: No   Sexually Abused: No   Family History  Problem Relation Age of Onset   Colon cancer Maternal Grandmother        age 44   Colon polyps Mother    CAD Brother    Rheum arthritis Sister      Objective: Office vital signs reviewed. BP (!) (P) 144/68   Pulse (P) 70   Temp (P) 98.1 F (36.7 C)   Ht (P) 5\' 2"  (1.575 m)   Wt (P) 194 lb 3.2 oz (88.1 kg)   SpO2 (P) 92%   BMI (P) 35.52 kg/m   Physical Examination:  General: Awake, alert, well nourished, No acute distress HEENT: Normal, sclera white, MMM.  No exophthalmos.  No goiter Cardio: regular rate and rhythm, S1S2 heard, no  murmurs appreciated Pulm: clear to auscultation bilaterally, no wheezes, rhonchi or rales; normal work of breathing on room air Extremities: warm, well perfused, No edema, cyanosis or clubbing; +2 pulses bilaterally MSK: Mild tenderness over the right costovertebral angle Skin: dry; intact; no rashes or lesions Neuro: see DM foot  Diabetic Foot Exam - Simple   Simple Foot Form Diabetic Foot exam was performed with the following findings: Yes 04/27/2021 10:59 AM  Visual Inspection No deformities, no ulcerations, no other skin breakdown bilaterally: Yes Sensation Testing Intact to touch and monofilament testing bilaterally: Yes Pulse Check Posterior Tibialis and Dorsalis pulse intact bilaterally: Yes Comments      Assessment/ Plan: 65 y.o. female   Controlled type 2 diabetes mellitus with other specified complication, with long-term current use of insulin (HCC) - Plan: Bayer DCA Hb A1c Waived, Insulin Pen Needle (PEN NEEDLES) 32G X 4 MM MISC, insulin aspart (NOVOLOG FLEXPEN) 100 UNIT/ML FlexPen, Dulaglutide (TRULICITY) 1.5 OF/1.2RF SOPN, Insulin Syringe-Needle U-100 (INSULIN SYRINGE .5CC/31GX5/16") 31G X 5/16" 0.5 ML MISC, insulin glargine (LANTUS) 100 UNIT/ML injection, DISCONTINUED: insulin glargine (LANTUS) 100 UNIT/ML injection  Hyperlipidemia associated with type 2 diabetes mellitus ([place]) - Plan: Lipid Panel, atorvastatin (LIPITOR) 40 MG tablet  Hypertension associated with diabetes ([place]) - Plan: losartan (COZAAR) 25 MG tablet  Acquired hypothyroidism - Plan: TSH, T4, Free,  levothyroxine (SYNTHROID) 137 MCG tablet  Back spasm - Plan: tiZANidine (ZANAFLEX) 4 MG tablet  Sugar at goal with A1c of 7.0.  Continue current regimen.  Medications have been renewed and sent to appropriate pharmacy  Continue statin.  Check fasting lipid panel.  Blood pressure borderline.  Unfortunately this was not repeated before she left.  Continue losartan 25 mg daily.  Thyroid levels to be checked.  Continue Synthroid  Okay to continue heat, oral NSAID.  I have given her a small quantity of muscle relaxers to have on hand should she need this.  Orders Placed This Encounter  Procedures   Bayer DCA Hb A1c Waived   TSH   T4, Free   Lipid Panel   Meds ordered this encounter  Medications   atorvastatin (LIPITOR) 40 MG tablet    Sig: Take 1 tablet (40 mg total) by mouth daily.    Dispense:  90 tablet    Refill:  3   DISCONTD: insulin glargine (LANTUS) 100 UNIT/ML injection    Sig: Inject 0.22-0.3 mLs (22-30 Units total) into the skin at bedtime.    Dispense:  30 mL    Refill:  3   Insulin Pen Needle (PEN NEEDLES) 32G X 4 MM MISC    Sig: UAD E11.69    Dispense:  100 each    Refill:  3   levothyroxine (SYNTHROID) 137 MCG tablet    Sig: TAKE ONE TABLET EACH MORNING BEFORE BREAKFAST    Dispense:  90 tablet    Refill:  3   losartan (COZAAR) 25 MG tablet    Sig: TAKE ONE (1) TABLET EACH DAY    Dispense:  90 tablet    Refill:  3   insulin aspart (NOVOLOG FLEXPEN) 100 UNIT/ML FlexPen    Sig: INJECT 10-17 UNITS SQ 3 TIMES DAILY WITHMEALS    Dispense:  45 mL    Refill:  3   SUMAtriptan (IMITREX) 100 MG tablet    Sig: May repeat in 2 hours if headache persists or recurs.    Dispense:  9 tablet    Refill:  12   Dulaglutide (TRULICITY) 1.5 XJ/8.8TG SOPN  Sig: INJECT 1.5MG  SQ ONCE A WEEK    Dispense:  6 mL    Refill:  3   Insulin Syringe-Needle U-100 (INSULIN SYRINGE .5CC/31GX5/16") 31G X 5/16" 0.5 ML MISC    Sig: UAD with Lantus E11.69    Dispense:  100 each    Refill:   3   tiZANidine (ZANAFLEX) 4 MG tablet    Sig: Take 0.5-1 tablets (2-4 mg total) by mouth every 8 (eight) hours as needed for muscle spasms (back pain).    Dispense:  30 tablet    Refill:  0   insulin glargine (LANTUS) 100 UNIT/ML injection    Sig: Inject 0.1-0.15 mLs (10-15 Units total) into the skin at bedtime. Note dose change!    Dispense:  15 mL    Refill:  [place], [place] [phone]

## 2021-04-27 NOTE — Patient Instructions (Signed)

## 2021-04-28 ENCOUNTER — Other Ambulatory Visit: Payer: Self-pay | Admitting: *Deleted

## 2021-04-28 DIAGNOSIS — E039 Hypothyroidism, unspecified: Secondary | ICD-10-CM

## 2021-04-28 LAB — LIPID PANEL
Chol/HDL Ratio: 2.8 ratio (ref 0.0–4.4)
Cholesterol, Total: 222 mg/dL — ABNORMAL HIGH (ref 100–199)
HDL: 78 mg/dL (ref >39)
LDL Chol Calc (NIH): 133 mg/dL — ABNORMAL HIGH (ref 0–99)
Triglycerides: 63 mg/dL (ref 0–149)
VLDL Cholesterol Cal: 11 mg/dL (ref 5–40)

## 2021-04-28 LAB — TSH: TSH: 6.79 u[IU]/mL — ABNORMAL HIGH (ref 0.450–4.500)

## 2021-04-28 LAB — T4, FREE: Free T4: 1.51 ng/dL (ref 0.82–1.77)

## 2021-05-11 DIAGNOSIS — H6123 Impacted cerumen, bilateral: Secondary | ICD-10-CM | POA: Diagnosis not present

## 2021-05-11 DIAGNOSIS — H903 Sensorineural hearing loss, bilateral: Secondary | ICD-10-CM | POA: Diagnosis not present

## 2021-06-22 DIAGNOSIS — H903 Sensorineural hearing loss, bilateral: Secondary | ICD-10-CM | POA: Diagnosis not present

## 2021-07-14 ENCOUNTER — Telehealth: Payer: Self-pay | Admitting: Family Medicine

## 2021-07-14 NOTE — Telephone Encounter (Signed)
Returned call To patient  Number given for patient to call [employer] to see if they can help her troubleshoot her reader We dont carry [place]or [place] readers any longer (not supplied by company)

## 2021-07-26 ENCOUNTER — Telehealth: Payer: Self-pay | Admitting: Family Medicine

## 2021-07-26 NOTE — Telephone Encounter (Signed)
Patient has [place] reader and sensor went out she is calling the company to replace, but she is afraid it will take weeks to get do we have a sample she could use please advise?

## 2021-07-27 NOTE — Telephone Encounter (Signed)
Pt said she spoke with pharmacy and they told her to download the app on her phone. She is calling us to let us know that she is going to try this and call back if she still has issues.

## 2021-07-27 NOTE — Telephone Encounter (Signed)
Pt is returning call.  

## 2021-07-27 NOTE — Telephone Encounter (Signed)
I spoke with her last week and let her know the company does not supply [place] readers any longer.  I'm also out of sensors, but awaiting a new shipment of libre sensors (not readers).  I would encourage patient to fingerstick/traditional glucometer until her replacement arrives

## 2021-08-04 ENCOUNTER — Other Ambulatory Visit: Payer: PPO

## 2021-08-04 DIAGNOSIS — E039 Hypothyroidism, unspecified: Secondary | ICD-10-CM | POA: Diagnosis not present

## 2021-08-05 LAB — TSH: TSH: 3.22 u[IU]/mL (ref 0.450–4.500)

## 2021-08-05 LAB — T4, FREE: Free T4: 1.19 ng/dL (ref 0.82–1.77)

## 2021-10-23 ENCOUNTER — Ambulatory Visit: Payer: PPO | Admitting: Family Medicine

## 2021-11-17 ENCOUNTER — Ambulatory Visit (INDEPENDENT_AMBULATORY_CARE_PROVIDER_SITE_OTHER): Payer: PPO | Admitting: Family Medicine

## 2021-11-17 ENCOUNTER — Encounter: Payer: Self-pay | Admitting: Family Medicine

## 2021-11-17 VITALS — BP 144/74 | HR 66 | Temp 98.1°F | Ht 62.0 in | Wt 197.2 lb

## 2021-11-17 DIAGNOSIS — E1159 Type 2 diabetes mellitus with other circulatory complications: Secondary | ICD-10-CM | POA: Diagnosis not present

## 2021-11-17 DIAGNOSIS — L239 Allergic contact dermatitis, unspecified cause: Secondary | ICD-10-CM

## 2021-11-17 DIAGNOSIS — E1169 Type 2 diabetes mellitus with other specified complication: Secondary | ICD-10-CM | POA: Diagnosis not present

## 2021-11-17 DIAGNOSIS — E785 Hyperlipidemia, unspecified: Secondary | ICD-10-CM

## 2021-11-17 DIAGNOSIS — I152 Hypertension secondary to endocrine disorders: Secondary | ICD-10-CM

## 2021-11-17 DIAGNOSIS — Z794 Long term (current) use of insulin: Secondary | ICD-10-CM

## 2021-11-17 DIAGNOSIS — E039 Hypothyroidism, unspecified: Secondary | ICD-10-CM | POA: Diagnosis not present

## 2021-11-17 DIAGNOSIS — R718 Other abnormality of red blood cells: Secondary | ICD-10-CM

## 2021-11-17 DIAGNOSIS — E1142 Type 2 diabetes mellitus with diabetic polyneuropathy: Secondary | ICD-10-CM

## 2021-11-17 LAB — BAYER DCA HB A1C WAIVED: HB A1C (BAYER DCA - WAIVED): 7.1 % — ABNORMAL HIGH (ref 4.8–5.6)

## 2021-11-17 MED ORDER — LOSARTAN POTASSIUM 25 MG PO TABS: ORAL_TABLET | ORAL | 3 refills | Status: DC

## 2021-11-17 MED ORDER — TRIAMCINOLONE ACETONIDE 0.1 % EX CREA: 1.0000 "application " | TOPICAL_CREAM | Freq: Two times a day (BID) | CUTANEOUS | 0 refills | Status: DC

## 2021-11-17 MED ORDER — LEVOCETIRIZINE DIHYDROCHLORIDE 5 MG PO TABS: 5.0000 mg | ORAL_TABLET | Freq: Every evening | ORAL | 0 refills | Status: DC | PRN

## 2021-11-17 MED ORDER — ATORVASTATIN CALCIUM 40 MG PO TABS: 40.0000 mg | ORAL_TABLET | Freq: Every day | ORAL | 3 refills | Status: DC

## 2021-11-17 MED ORDER — TRULICITY 3 MG/0.5ML ~~LOC~~ SOAJ: 3.0000 mg | SUBCUTANEOUS | 4 refills | Status: DC

## 2021-11-17 MED ORDER — METHYLPREDNISOLONE ACETATE 40 MG/ML IJ SUSP
40.0000 mg | Freq: Once | INTRAMUSCULAR | Status: AC
  Administered 2021-11-17: 40 mg via INTRAMUSCULAR

## 2021-11-17 NOTE — Patient Instructions (Addendum)
Sugar is a little up.  A1c 7.1.  goal <7.0. ? ?Watch sugar/carb intake. Exercise ? ?Get Colonoscopy scheduled. You are OVERDUE ?

## 2021-11-17 NOTE — Progress Notes (Signed)
? ?Subjective: ?CC:Dm ?PCP: Janora Norlander, DO ?CHE:NIDPOEU R Bromell is a 66 y.o. female presenting to clinic today for: ? ?1. Type 2 Diabetes with hypertension, hyperlipidemia:  ?Not currently compliant with Lipitor or losartan.  Reports blood sugars have been a little higher than normal despite compliance with insulin and Trulicity. ? ?Last eye exam: Up-to-date ?Last foot exam: Up-to-date ?Last A1c:  ?Lab Results  ?Component Value Date  ? HGBA1C 7.0 (H) 04/27/2021  ? ?Nephropathy screen indicated?:  Needs to resume ARB ?Last flu, zoster and/or pneumovax:  ?Immunization History  ?Administered Date(s) Administered  ? Influenza Whole 05/16/2010  ? Influenza,inj,Quad PF,6+ Mos 05/10/2015, 04/26/2016, 06/19/2017, 05/28/2018  ? Pneumococcal Conjugate-13 01/29/2014  ? Pneumococcal Polysaccharide-23 07/16/2008, 01/30/2016  ? Td 07/17/2007  ? Tdap 07/28/2019  ? ? ?ROS: Denies dizziness, LOC, polyuria, polydipsia, unintended weight loss/gain, foot ulcerations, numbness or tingling in extremities, shortness of breath or chest pain. ? ?2.  Rash ?She reports an itchy rash on her chest, inside of her knees and along her upper forearms.  Her dog apparently was ill recently with some type of dermatitis that subsequently required both antihistamines and an antibiotic.  Both she and her husband have similar now.  She denies any known contact with poison oak, poison ivy, chiggers or other insects.  She notes that the dog was tested for various things like mites, yeast and these were not visualized by the veterinarian.  She is currently utilizing Benadryl for the itching and cortisone with minimal relief. ? ? ? ?ROS: Per HPI ? ?Allergies  ?Allergen Reactions  ? Codeine Nausea Only and Other (See Comments)  ?  NAUSEA AND CAUSES HER TO HAVE BAD DREAMS  ? Gabapentin   ?  Confusion ?  ? Lisinopril   ?  Coughing  ? ?Past Medical History:  ?Diagnosis Date  ? Anemia 07/23/2011  ? Anxiety   ? Diabetes mellitus   ? Diabetic gastroparesis  ([place]) 07/23/2011  ? Suspected  ? Headache(784.0)   ? Hearing difficulty   ? Hyperlipidemia   ? Hypothyroidism   ? Kidney stones   ? Microcytic anemia 07/23/2011  ? Thalassemia minor   ? ????? not really worked up per pt but daughter has it???  ? ? ?Current Outpatient Medications:  ?  aspirin 81 MG tablet, Take 81 mg by mouth daily., Disp: , Rfl:  ?  atorvastatin (LIPITOR) 40 MG tablet, Take 1 tablet (40 mg total) by mouth daily., Disp: 90 tablet, Rfl: 3 ?  Continuous Blood Gluc Receiver (FREESTYLE LIBRE 2 READER) DEVI, Use to test blood sugar up to 6 times daily. DX: E11.9, Disp: 1 each, Rfl: 0 ?  Continuous Blood Gluc Sensor (FREESTYLE LIBRE 2 SENSOR) MISC, USE TO TEST BLOOD SUGAR UP TO 6 TIMES DAILY, Disp: 2 each, Rfl: 11 ?  docusate sodium (COLACE) 100 MG capsule, Take 200 mg by mouth daily as needed. , Disp: , Rfl:  ?  Dulaglutide (TRULICITY) 1.5 MP/5.3IR SOPN, INJECT 1.$RemoveBefore'5MG'[id number]$  SQ ONCE A WEEK, Disp: 6 mL, Rfl: 3 ?  glucose blood (GNP EASY TOUCH GLUCOSE TEST) test strip, EVERY DAY, Disp: 100 each, Rfl: 11 ?  insulin aspart (NOVOLOG FLEXPEN) 100 UNIT/ML FlexPen, INJECT 10-17 UNITS SQ 3 TIMES DAILY WITHMEALS, Disp: 45 mL, Rfl: 3 ?  insulin glargine (LANTUS) 100 UNIT/ML injection, Inject 0.1-0.15 mLs (10-15 Units total) into the skin at bedtime. Note dose change!, Disp: 15 mL, Rfl: 3 ?  Insulin Pen Needle (PEN NEEDLES) 32G X 4 MM MISC, UAD  E11.69, Disp: 100 each, Rfl: 3 ?  Insulin Syringe-Needle U-100 (INSULIN SYRINGE .5CC/31GX5/16") 31G X 5/16" 0.5 ML MISC, UAD with Lantus E11.69, Disp: 100 each, Rfl: 3 ?  levothyroxine (SYNTHROID) 137 MCG tablet, TAKE ONE TABLET EACH MORNING BEFORE BREAKFAST, Disp: 90 tablet, Rfl: 3 ?  losartan (COZAAR) 25 MG tablet, TAKE ONE (1) TABLET EACH DAY, Disp: 90 tablet, Rfl: 3 ?  Multiple Vitamin (MULTIVITAMIN WITH MINERALS) TABS tablet, Take 1 tablet by mouth daily., Disp: , Rfl:  ?  SUMAtriptan (IMITREX) 100 MG tablet, May repeat in 2 hours if headache persists or recurs., Disp: 9 tablet, Rfl:  12 ?  tiZANidine (ZANAFLEX) 4 MG tablet, Take 0.5-1 tablets (2-4 mg total) by mouth every 8 (eight) hours as needed for muscle spasms (back pain)., Disp: 30 tablet, Rfl: 0 ?Social History  ? ?Socioeconomic History  ? Marital status: Married  ?  Spouse name: [name]  ? Number of children: 1  ? Years of education: Not on file  ? Highest education level: Some college, no degree  ?Occupational History  ? Occupation: Location manager  ?  Employer: [employer]  ? Occupation: Retired  ?Tobacco Use  ? Smoking status: Never  ? Smokeless tobacco: Never  ? Tobacco comments:  ?  Never smoked  ?Vaping Use  ? Vaping Use: Never used  ?Substance and Sexual Activity  ? Alcohol use: No  ?  Alcohol/week: 0.0 standard drinks  ? Drug use: No  ? Sexual activity: Not Currently  ?Other Topics Concern  ? Not on file  ?Social History Narrative  ? Lives home with husband - Daughter lives in [place] - she has a great grandchild also  ? ?Social Determinants of Health  ? ?Financial Resource Strain: Low Risk   ? Difficulty of Paying Living Expenses: Not very hard  ?Food Insecurity: No Food Insecurity  ? Worried About Charity fundraiser in the Last Year: Never true  ? Ran Out of Food in the Last Year: Never true  ?Transportation Needs: No Transportation Needs  ? Lack of Transportation (Medical): No  ? Lack of Transportation (Non-Medical): No  ?Physical Activity: Sufficiently Active  ? Days of Exercise per Week: 7 days  ? Minutes of Exercise per Session: 50 min  ?Stress: No Stress Concern Present  ? Feeling of Stress : Not at all  ?Social Connections: Socially Integrated  ? Frequency of Communication with Friends and Family: More than three times a week  ? Frequency of Social Gatherings with Friends and Family: More than three times a week  ? Attends Religious Services: More than 4 times per year  ? Active Member of Clubs or Organizations: Yes  ? Attends Archivist Meetings: More than 4 times per year  ? Marital Status: Married   ?Intimate Partner Violence: Not At Risk  ? Fear of Current or Ex-Partner: No  ? Emotionally Abused: No  ? Physically Abused: No  ? Sexually Abused: No  ? ?Family History  ?Problem Relation Age of Onset  ? Colon cancer Maternal Grandmother   ?     age 75  ? Colon polyps Mother   ? CAD Brother   ? Rheum arthritis Sister   ? ? ?Objective: ?Office vital signs reviewed. ?BP (!) 144/74   Pulse 66   Temp 98.1 ?F (36.7 ?C)   Ht $R'5\' 2"'vY$  (1.575 m)   Wt 197 lb 3.2 oz (89.4 kg)   SpO2 97%   BMI 36.07 kg/m?  ? ?Physical Examination:  ?  General: Awake, alert, morbidly obese, No acute distress ?HEENT: Sclera white.  Moist mucous membranes ?Cardio: regular rate and rhythm, S1S2 heard, no murmurs appreciated ?Pulm: clear to auscultation bilaterally, no wheezes, rhonchi or rales; normal work of breathing on room air ?Skin: Mildly erythematous, excoriated rash noted along the d?colletage, inner knees and forearms.  No evidence of secondary infection.  No active bleeding. ? ?Assessment/ Plan: ?66 y.o. female  ? ?Type 2 diabetes mellitus with other specified complication, with long-term current use of insulin (HCC) - Plan: CMP14+EGFR, Bayer DCA Hb A1c Waived, Dulaglutide (TRULICITY) 3 HF/2.9MS SOPN ? ?Diabetic polyneuropathy associated with type 2 diabetes mellitus ([place]) ? ?Hyperlipidemia associated with type 2 diabetes mellitus ([place]) - Plan: Lipid Panel, atorvastatin (LIPITOR) 40 MG tablet ? ?Hypertension associated with diabetes ([place]) - Plan: losartan (COZAAR) 25 MG tablet ? ?Acquired hypothyroidism - Plan: TSH, T4, Free ? ?Low mean corpuscular volume (MCV) - Plan: CBC with Differential, Ferritin ? ?Allergic contact dermatitis, unspecified trigger - Plan: methylPREDNISolone acetate (DEPO-MEDROL) injection 40 mg, triamcinolone cream (KENALOG) 0.1 %, levocetirizine (XYZAL) 5 MG tablet ? ?Sugar now not controlled with A1c rising to 7.1.  I have advanced her Trulicity to 3 mg weekly.  We will complete ROI for diabetic eye  exam ? ?Lipid panel, CMP collected.  Resume statin given diabetes ? ?Blood pressure borderline.  Advised to resume use of losartan. ? ?Asymptomatic from a thyroid standpoint.  Check thyroid levels ? ?CBC, ferritin co

## 2021-11-18 LAB — CBC WITH DIFFERENTIAL/PLATELET
Basophils Absolute: 0.1 10*3/uL (ref 0.0–0.2)
Basos: 1 %
EOS (ABSOLUTE): 0.2 10*3/uL (ref 0.0–0.4)
Eos: 3 %
Hematocrit: 40.2 % (ref 34.0–46.6)
Hemoglobin: 12.6 g/dL (ref 11.1–15.9)
Immature Grans (Abs): 0 10*3/uL (ref 0.0–0.1)
Immature Granulocytes: 0 %
Lymphocytes Absolute: 2.7 10*3/uL (ref 0.7–3.1)
Lymphs: 37 %
MCH: 20.9 pg — ABNORMAL LOW (ref 26.6–33.0)
MCHC: 31.3 g/dL — ABNORMAL LOW (ref 31.5–35.7)
MCV: 67 fL — ABNORMAL LOW (ref 79–97)
Monocytes Absolute: 0.7 10*3/uL (ref 0.1–0.9)
Monocytes: 10 %
Neutrophils Absolute: 3.6 10*3/uL (ref 1.4–7.0)
Neutrophils: 49 %
Platelets: 324 10*3/uL (ref 150–450)
RBC: 6.03 x10E6/uL — ABNORMAL HIGH (ref 3.77–5.28)
RDW: 16.3 % — ABNORMAL HIGH (ref 11.7–15.4)
WBC: 7.3 10*3/uL (ref 3.4–10.8)

## 2021-11-18 LAB — CMP14+EGFR
ALT: 20 IU/L (ref 0–32)
AST: 18 IU/L (ref 0–40)
Albumin/Globulin Ratio: 1.6 (ref 1.2–2.2)
Albumin: 4.2 g/dL (ref 3.8–4.8)
Alkaline Phosphatase: 144 IU/L — ABNORMAL HIGH (ref 44–121)
BUN/Creatinine Ratio: 20 (ref 12–28)
BUN: 17 mg/dL (ref 8–27)
Bilirubin Total: 0.4 mg/dL (ref 0.0–1.2)
CO2: 21 mmol/L (ref 20–29)
Calcium: 9.3 mg/dL (ref 8.7–10.3)
Chloride: 102 mmol/L (ref 96–106)
Creatinine, Ser: 0.87 mg/dL (ref 0.57–1.00)
Globulin, Total: 2.7 g/dL (ref 1.5–4.5)
Glucose: 108 mg/dL — ABNORMAL HIGH (ref 70–99)
Potassium: 4.2 mmol/L (ref 3.5–5.2)
Sodium: 140 mmol/L (ref 134–144)
Total Protein: 6.9 g/dL (ref 6.0–8.5)
eGFR: 74 mL/min/{1.73_m2} (ref >59)

## 2021-11-18 LAB — LIPID PANEL
Chol/HDL Ratio: 3 ratio (ref 0.0–4.4)
Cholesterol, Total: 249 mg/dL — ABNORMAL HIGH (ref 100–199)
HDL: 84 mg/dL (ref >39)
LDL Chol Calc (NIH): 153 mg/dL — ABNORMAL HIGH (ref 0–99)
Triglycerides: 73 mg/dL (ref 0–149)
VLDL Cholesterol Cal: 12 mg/dL (ref 5–40)

## 2021-11-18 LAB — TSH: TSH: 5.17 u[IU]/mL — ABNORMAL HIGH (ref 0.450–4.500)

## 2021-11-18 LAB — FERRITIN: Ferritin: 53 ng/mL (ref 15–150)

## 2021-11-18 LAB — T4, FREE: Free T4: 1.44 ng/dL (ref 0.82–1.77)

## 2021-11-22 DIAGNOSIS — I420 Dilated cardiomyopathy: Secondary | ICD-10-CM | POA: Diagnosis not present

## 2021-11-22 DIAGNOSIS — I10 Essential (primary) hypertension: Secondary | ICD-10-CM | POA: Diagnosis not present

## 2022-01-22 ENCOUNTER — Ambulatory Visit: Payer: PPO

## 2022-01-22 ENCOUNTER — Ambulatory Visit (INDEPENDENT_AMBULATORY_CARE_PROVIDER_SITE_OTHER): Payer: PPO

## 2022-01-22 VITALS — Ht 62.0 in | Wt 192.0 lb

## 2022-01-22 DIAGNOSIS — Z1231 Encounter for screening mammogram for malignant neoplasm of breast: Secondary | ICD-10-CM

## 2022-01-22 DIAGNOSIS — Z0000 Encounter for general adult medical examination without abnormal findings: Secondary | ICD-10-CM | POA: Diagnosis not present

## 2022-01-22 DIAGNOSIS — Z780 Asymptomatic menopausal state: Secondary | ICD-10-CM | POA: Diagnosis not present

## 2022-01-22 NOTE — Patient Instructions (Signed)
Ms. Bouldin , Thank you for taking time to come for your Medicare Wellness Visit. I appreciate your ongoing commitment to your health goals. Please review the following plan we discussed and let me know if I can assist you in the future.   Screening recommendations/referrals: Colonoscopy:Done 01/10/2012 Repeat in 5 years. Discussed. Please call office to schedule.  Mammogram: Done 03/27/2021. Repeat annually  Bone Density: Order placed today to schedule same day as Mammogram at [facility].  Recommended yearly ophthalmology/optometry visit for glaucoma screening and checkup Recommended yearly dental visit for hygiene and checkup  Vaccinations: Influenza vaccine: Done 05/28/2018. Available fall 2023. Pneumococcal vaccine: Done 01/29/2014 and 01/30/2016. Tdap vaccine: Done 07/28/2019 Repeat in 10 years  Shingles vaccine: Discussed. Available at your local pharmacy.   Covid-19:Available at your local pharmacy.  Advanced directives: Please bring a copy of your health care power of attorney and living will to the office to be added to your chart at your convenience.   Conditions/risks identified: Aim for 30 minutes of exercise or walking, 6-8 glasses of water, and 5 servings of fruits and vegetables each day.   Next appointment: Follow up in one year for your annual wellness visit 2024.   Preventive Care 65 Years and Older, Female Preventive care refers to lifestyle choices and visits with your health care provider that can promote health and wellness. What does preventive care include? A yearly physical exam. This is also called an annual well check. Dental exams once or twice a year. Routine eye exams. Ask your health care provider how often you should have your eyes checked. Personal lifestyle choices, including: Daily care of your teeth and gums. Regular physical activity. Eating a healthy diet. Avoiding tobacco and drug use. Limiting alcohol use. Practicing safe sex. Taking low-dose  aspirin every day. Taking vitamin and mineral supplements as recommended by your health care provider. What happens during an annual well check? The services and screenings done by your health care provider during your annual well check will depend on your age, overall health, lifestyle risk factors, and family history of disease. Counseling  Your health care provider may ask you questions about your: Alcohol use. Tobacco use. Drug use. Emotional well-being. Home and relationship well-being. Sexual activity. Eating habits. History of falls. Memory and ability to understand (cognition). Work and work Statistician. Reproductive health. Screening  You may have the following tests or measurements: Height, weight, and BMI. Blood pressure. Lipid and cholesterol levels. These may be checked every 5 years, or more frequently if you are over 58 years old. Skin check. Lung cancer screening. You may have this screening every year starting at age 49 if you have a 30-pack-year history of smoking and currently smoke or have quit within the past 15 years. Fecal occult blood test (FOBT) of the stool. You may have this test every year starting at age 39. Flexible sigmoidoscopy or colonoscopy. You may have a sigmoidoscopy every 5 years or a colonoscopy every 10 years starting at age 39. Hepatitis C blood test. Hepatitis B blood test. Sexually transmitted disease (STD) testing. Diabetes screening. This is done by checking your blood sugar (glucose) after you have not eaten for a while (fasting). You may have this done every 1-3 years. Bone density scan. This is done to screen for osteoporosis. You may have this done starting at age 93. Mammogram. This may be done every 1-2 years. Talk to your health care provider about how often you should have regular mammograms. Talk with your health care  provider about your test results, treatment options, and if necessary, the need for more tests. Vaccines  Your  health care provider may recommend certain vaccines, such as: Influenza vaccine. This is recommended every year. Tetanus, diphtheria, and acellular pertussis (Tdap, Td) vaccine. You may need a Td booster every 10 years. Zoster vaccine. You may need this after age 30. Pneumococcal 13-valent conjugate (PCV13) vaccine. One dose is recommended after age 76. Pneumococcal polysaccharide (PPSV23) vaccine. One dose is recommended after age 89. Talk to your health care provider about which screenings and vaccines you need and how often you need them. This information is not intended to replace advice given to you by your health care provider. Make sure you discuss any questions you have with your health care provider. Document Released: 07/29/2015 Document Revised: 03/21/2016 Document Reviewed: 05/03/2015 Elsevier Interactive Patient Education  2017 [place] Prevention in the Home Falls can cause injuries. They can happen to people of all ages. There are many things you can do to make your home safe and to help prevent falls. What can I do on the outside of my home? Regularly fix the edges of walkways and driveways and fix any cracks. Remove anything that might make you trip as you walk through a door, such as a raised step or threshold. Trim any bushes or trees on the path to your home. Use bright outdoor lighting. Clear any walking paths of anything that might make someone trip, such as rocks or tools. Regularly check to see if handrails are loose or broken. Make sure that both sides of any steps have handrails. Any raised decks and porches should have guardrails on the edges. Have any leaves, snow, or ice cleared regularly. Use sand or salt on walking paths during winter. Clean up any spills in your garage right away. This includes oil or grease spills. What can I do in the bathroom? Use night lights. Install grab bars by the toilet and in the tub and shower. Do not use towel bars as  grab bars. Use non-skid mats or decals in the tub or shower. If you need to sit down in the shower, use a plastic, non-slip stool. Keep the floor dry. Clean up any water that spills on the floor as soon as it happens. Remove soap buildup in the tub or shower regularly. Attach bath mats securely with double-sided non-slip rug tape. Do not have throw rugs and other things on the floor that can make you trip. What can I do in the bedroom? Use night lights. Make sure that you have a light by your bed that is easy to reach. Do not use any sheets or blankets that are too big for your bed. They should not hang down onto the floor. Have a firm chair that has side arms. You can use this for support while you get dressed. Do not have throw rugs and other things on the floor that can make you trip. What can I do in the kitchen? Clean up any spills right away. Avoid walking on wet floors. Keep items that you use a lot in easy-to-reach places. If you need to reach something above you, use a strong step stool that has a grab bar. Keep electrical cords out of the way. Do not use floor polish or wax that makes floors slippery. If you must use wax, use non-skid floor wax. Do not have throw rugs and other things on the floor that can make you trip. What can I  do with my stairs? Do not leave any items on the stairs. Make sure that there are handrails on both sides of the stairs and use them. Fix handrails that are broken or loose. Make sure that handrails are as long as the stairways. Check any carpeting to make sure that it is firmly attached to the stairs. Fix any carpet that is loose or worn. Avoid having throw rugs at the top or bottom of the stairs. If you do have throw rugs, attach them to the floor with carpet tape. Make sure that you have a light switch at the top of the stairs and the bottom of the stairs. If you do not have them, ask someone to add them for you. What else can I do to help prevent  falls? Wear shoes that: Do not have high heels. Have rubber bottoms. Are comfortable and fit you well. Are closed at the toe. Do not wear sandals. If you use a stepladder: Make sure that it is fully opened. Do not climb a closed stepladder. Make sure that both sides of the stepladder are locked into place. Ask someone to hold it for you, if possible. Clearly mark and make sure that you can see: Any grab bars or handrails. First and last steps. Where the edge of each step is. Use tools that help you move around (mobility aids) if they are needed. These include: Canes. Walkers. Scooters. Crutches. Turn on the lights when you go into a dark area. Replace any light bulbs as soon as they burn out. Set up your furniture so you have a clear path. Avoid moving your furniture around. If any of your floors are uneven, fix them. If there are any pets around you, be aware of where they are. Review your medicines with your doctor. Some medicines can make you feel dizzy. This can increase your chance of falling. Ask your doctor what other things that you can do to help prevent falls. This information is not intended to replace advice given to you by your health care provider. Make sure you discuss any questions you have with your health care provider. Document Released: 04/28/2009 Document Revised: 12/08/2015 Document Reviewed: 08/06/2014 Elsevier Interactive Patient Education  2017 [employer].

## 2022-01-22 NOTE — Progress Notes (Signed)
Subjective:   Melissa Schneider is a 66 y.o. female who presents for Medicare Annual (Subsequent) preventive examination. Marland Kitchenahavir  Review of Systems     Cardiac Risk Factors include: advanced age (>44mn, >>47women);diabetes mellitus;hypertension;dyslipidemia;sedentary lifestyle;obesity (BMI >30kg/m2)     Objective:    Today's Vitals   01/22/22 1450  Weight: 192 lb (87.1 kg)  Height: '5\' 2"'$  (1.575 m)   Body mass index is 35.12 kg/m.     01/22/2022    3:02 PM 01/19/2021   10:50 AM 01/19/2020   10:16 AM 01/12/2019   10:39 AM 01/12/2019   10:38 AM 04/29/2015    2:05 PM 08/22/2013    6:00 PM  Advanced Directives  Does Patient Have a Medical Advance Directive? Yes No No No No No Patient does not have advance directive;Patient would not like information  Type of AScientist, forensicPower of APrairiewood VillageLiving will        Does patient want to make changes to medical advance directive?  Yes (MAU/Ambulatory/Procedural Areas - Information given)       Copy of HFaithin Chart? No - copy requested        Would patient like information on creating a medical advance directive?  Yes (MAU/Ambulatory/Procedural Areas - Information given) No - Patient declined Yes (MAU/Ambulatory/Procedural Areas - Information given) Yes (MAU/Ambulatory/Procedural Areas - Information given) No - patient declined information   Pre-existing out of facility DNR order (yellow form or pink MOST form)       No    Current Medications (verified) Outpatient Encounter Medications as of 01/22/2022  Medication Sig   aspirin 81 MG tablet Take 81 mg by mouth daily.   atorvastatin (LIPITOR) 40 MG tablet Take 1 tablet (40 mg total) by mouth daily.   Continuous Blood Gluc Receiver (FREESTYLE LIBRE 2 READER) DEVI Use to test blood sugar up to 6 times daily. DX: E11.9   Continuous Blood Gluc Sensor (FREESTYLE LIBRE 2 SENSOR) MISC USE TO TEST BLOOD SUGAR UP TO 6 TIMES DAILY   docusate sodium (COLACE) 100  MG capsule Take 200 mg by mouth daily as needed.    Dulaglutide (TRULICITY) 3 MJK/0.9FGSOPN Inject 3 mg as directed once a week.   glucose blood (GNP EASY TOUCH GLUCOSE TEST) test strip EVERY DAY   insulin aspart (NOVOLOG FLEXPEN) 100 UNIT/ML FlexPen INJECT 10-17 UNITS SQ 3 TIMES DAILY WITHMEALS   insulin glargine (LANTUS) 100 UNIT/ML injection Inject 0.1-0.15 mLs (10-15 Units total) into the skin at bedtime. Note dose change!   Insulin Pen Needle (PEN NEEDLES) 32G X 4 MM MISC UAD E11.69   Insulin Syringe-Needle U-100 (INSULIN SYRINGE .5CC/31GX5/16") 31G X 5/16" 0.5 ML MISC UAD with Lantus E11.69   levocetirizine (XYZAL) 5 MG tablet Take 1 tablet (5 mg total) by mouth at bedtime as needed for allergies (itching/ rash).   levothyroxine (SYNTHROID) 137 MCG tablet TAKE ONE TABLET EACH MORNING BEFORE BREAKFAST   losartan (COZAAR) 25 MG tablet TAKE ONE (1) TABLET EACH DAY   Multiple Vitamin (MULTIVITAMIN WITH MINERALS) TABS tablet Take 1 tablet by mouth daily.   SUMAtriptan (IMITREX) 100 MG tablet May repeat in 2 hours if headache persists or recurs.   triamcinolone cream (KENALOG) 0.1 % Apply 1 application. topically 2 (two) times daily. X7-10 days for rash   No facility-administered encounter medications on file as of 01/22/2022.    Allergies (verified) Codeine, Gabapentin, and Lisinopril   History: Past Medical History:  Diagnosis Date   Anemia  07/23/2011   Anxiety    Diabetes mellitus    Diabetic gastroparesis ([place]) 07/23/2011   Suspected   Headache(784.0)    Hearing difficulty    Hyperlipidemia    Hypothyroidism    Kidney stones    Microcytic anemia 07/23/2011   Thalassemia minor    ????? not really worked up per pt but daughter has it???   Past Surgical History:  Procedure Laterality Date   COLONOSCOPY  2004   Dr. Jim Desanctis: small fissue and hemorrhoids   COLONOSCOPY  12/2011   Dr. Paulita Fujita: normal   CYSTOSCOPY W/ URETERAL STENT PLACEMENT Right 08/13/2013   Procedure: CYSTOSCOPY  WITH RETROGRADE PYELOGRAM/URETERAL STENT PLACEMENT;  Surgeon: Marissa Nestle, MD;  Location: [facility];  Service: Urology;  Laterality: Right;   EXTRACORPOREAL SHOCK WAVE LITHOTRIPSY Right 08/19/2013   Procedure: EXTRACORPOREAL SHOCK WAVE LITHOTRIPSY (ESWL) RIGHT URETERAL CALCULUS;  Surgeon: Marissa Nestle, MD;  Location: [facility];  Service: Urology;  Laterality: Right;   WRIST SURGERY Right    Family History  Problem Relation Age of Onset   Colon cancer Maternal Grandmother        age 36   Colon polyps Mother    CAD Brother    Rheum arthritis Sister    Social History   Socioeconomic History   Marital status: Married    Spouse name: [name]   Number of children: 1   Years of education: Not on file   Highest education level: Some college, no degree  Occupational History   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   Occupation: Retired  Tobacco Use   Smoking status: Never   Smokeless tobacco: Never   Tobacco comments:    Never smoked  Vaping Use   Vaping Use: Never used  Substance and Sexual Activity   Alcohol use: No    Alcohol/week: 0.0 standard drinks of alcohol   Drug use: No   Sexual activity: Not Currently  Other Topics Concern   Not on file  Social History Narrative   Lives home with husband - Daughter lives in [place] - she has a great grandchild also   Social Determinants of Health   Financial Resource Strain: Low Risk  (01/22/2022)   Overall Financial Resource Strain (CARDIA)    Difficulty of Paying Living Expenses: Not hard at all  Food Insecurity: No [place] (01/22/2022)   Hunger Vital Sign    Worried About Running Out of Food in the Last Year: Never true    [place] in the Last Year: Never true  Transportation Needs: No Transportation Needs (01/22/2022)   PRAPARE - Hydrologist (Medical): No    Lack of Transportation (Non-Medical): No  Physical Activity: Sufficiently Active (01/22/2022)    Exercise Vital Sign    Days of Exercise per Week: 7 days    Minutes of Exercise per Session: 30 min  Stress: No Stress Concern Present (01/22/2022)   [place]    Feeling of Stress : Not at all  Social Connections: [place] (01/22/2022)   Social Connection and Isolation Panel [NHANES]    Frequency of Communication with Friends and Family: More than three times a week    Frequency of Social Gatherings with Friends and Family: Once a week    Attends Religious Services: More than 4 times per year    Active Member of Clubs or Organizations: Yes    Attends  Music therapist: More than 4 times per year    Marital Status: Married    Tobacco Counseling Counseling given: Not Answered Tobacco comments: Never smoked   Clinical Intake:  Pre-visit preparation completed: Yes  Pain : No/denies pain     BMI - recorded: 35.12 Nutritional Status: BMI > 30  Obese Nutritional Risks: None Diabetes: Yes  How often do you need to have someone help you when you read instructions, pamphlets, or other written materials from your doctor or pharmacy?: 1 - Never  Diabetic?Nutrition Risk Assessment:  Has the patient had any N/V/D within the last 2 months?  No  Does the patient have any non-healing wounds?  No  Has the patient had any unintentional weight loss or weight gain?  No   Diabetes:  Is the patient diabetic?  Yes  If diabetic, was a CBG obtained today?  No  Did the patient bring in their glucometer from home?  No  How often do you monitor your CBG's? Freestyle Libre.   Financial Strains and Diabetes Management:  Are you having any financial strains with the device, your supplies or your medication? No .  Does the patient want to be seen by Chronic Care Management for management of their diabetes?  No  Would the patient like to be referred to a Nutritionist or for Diabetic Management?  No    Diabetic Exams:  Diabetic Eye Exam: Completed 02/2021. Pt has been advised about the importance in completing this exam. Diabetic Foot Exam: Completed 04/27/2021. Pt has been advised about the importance in completing this exam.   Interpreter Needed?: No  Information entered by :: mj Yariah Selvey, lpn   Activities of Daily Living    01/22/2022    3:05 PM  In your present state of health, do you have any difficulty performing the following activities:  Hearing? 1  Vision? 0  Difficulty concentrating or making decisions? 0  Walking or climbing stairs? 0  Dressing or bathing? 0  Doing errands, shopping? 0  Preparing Food and eating ? N  Using the Toilet? N  In the past six months, have you accidently leaked urine? N  Do you have problems with loss of bowel control? N  Managing your Medications? N  Managing your Finances? N  Housekeeping or managing your Housekeeping? N    Patient Care Team: Janora Norlander, DO as PCP - General (Family Medicine) Harl Bowie Alphonse Guild, MD as PCP - Cardiology (Cardiology) Gala Romney Cristopher Estimable, MD as Consulting Physician (Gastroenterology) Ilean China, RN as Case Manager [place], Royce Macadamia, [facility] (Pharmacist)  Indicate any recent Medical Services you may have received from other than [facility] providers in the past year (date may be approximate).     Assessment:   This is a routine wellness examination for Melissa Schneider.  Hearing/Vision screen Hearing Screening - Comments:: Wears hearing aids.  Vision Screening - Comments:: Glasses. Dr. Wynetta Emery in [place]. 02/2021.  Dietary issues and exercise activities discussed: Current Exercise Habits: Home exercise routine, Type of exercise: walking, Time (Minutes): 20, Frequency (Times/Week): 7, Weekly Exercise (Minutes/Week): 140, Intensity: Mild, Exercise limited by: cardiac condition(s)   Goals Addressed             This Visit's Progress    Exercise 3x per week (30 min per time)   On track    Try to exercise  for at least 30 minutes, 3 times weekly. 01/22/2022-Travel more.       Depression Screen    01/22/2022  2:56 PM 11/17/2021   11:05 AM 04/27/2021   10:43 AM 01/19/2021   10:36 AM 10/26/2020   10:23 AM 01/22/2020    9:03 AM 01/19/2020   10:16 AM  PHQ 2/9 Scores  PHQ - 2 Score 0 0 0 0 0 0 0  PHQ- 9 Score  2    0     Fall Risk    01/22/2022    3:03 PM 11/17/2021   11:05 AM 04/27/2021   10:43 AM 01/19/2021   10:48 AM 10/26/2020   10:23 AM  Fall Risk   Falls in the past year? 0 0 0 1 1  Number falls in past yr: 0   0 1  Injury with Fall? 0   0 0  Risk for fall due to : No Fall Risks   History of fall(s);Impaired vision History of fall(s)  Follow up Falls prevention discussed   Falls prevention discussed     FALL RISK PREVENTION PERTAINING TO THE HOME:  Any stairs in or around the home? No  If so, are there any without handrails? No  Home free of loose throw rugs in walkways, pet beds, electrical cords, etc? Yes  Adequate lighting in your home to reduce risk of falls? Yes   ASSISTIVE DEVICES UTILIZED TO PREVENT FALLS:  Life alert? No  Use of a cane, walker or w/c? No  Grab bars in the bathroom? Yes  Shower chair or bench in shower? No  Elevated toilet seat or a handicapped toilet? Yes   TIMED UP AND GO:  Was the test performed? No .  Phone visit.   Cognitive Function:        01/22/2022    3:05 PM 01/19/2020   10:17 AM 01/12/2019   10:39 AM  6CIT Screen  What Year? 0 points 0 points 0 points  What month? 0 points 0 points 0 points  What time? 0 points 0 points 0 points  Count back from 20 0 points 2 points 0 points  Months in reverse 0 points 0 points 0 points  Repeat phrase 0 points 0 points 0 points  Total Score 0 points 2 points 0 points    Immunizations Immunization History  Administered Date(s) Administered   Influenza Whole 05/16/2010   Influenza,inj,Quad PF,6+ Mos 05/10/2015, 04/26/2016, 06/19/2017, 05/28/2018   Pneumococcal Conjugate-13 01/29/2014    Pneumococcal Polysaccharide-23 07/16/2008, 01/30/2016   Td 07/17/2007   Tdap 07/28/2019    TDAP status: Up to date  Flu Vaccine status: Due, Education has been provided regarding the importance of this vaccine. Advised may receive this vaccine at local pharmacy or Health Dept. Aware to provide a copy of the vaccination record if obtained from local pharmacy or Health Dept. Verbalized acceptance and understanding.  Pneumococcal vaccine status: Up to date  Covid-19 vaccine status: Declined, Education has been provided regarding the importance of this vaccine but patient still declined. Advised may receive this vaccine at local pharmacy or Health Dept.or vaccine clinic. Aware to provide a copy of the vaccination record if obtained from local pharmacy or Health Dept. Verbalized acceptance and understanding.  Qualifies for Shingles Vaccine? Yes   Zostavax completed No   Shingrix Completed?: No.    Education has been provided regarding the importance of this vaccine. Patient has been advised to call insurance company to determine out of pocket expense if they have not yet received this vaccine. Advised may also receive vaccine at local pharmacy or Health Dept. Verbalized acceptance and understanding.  Screening Tests  Health Maintenance  Topic Date Due   DEXA SCAN  10/22/2021   COVID-19 Vaccine (1) 02/07/2022 (Originally 04/23/1956)   Zoster Vaccines- Shingrix (1 of 2) 02/17/2022 (Originally 10/23/2006)   Pneumonia Vaccine 31+ Years old (3 - PPSV23 if available, else PCV20) 05/15/2022 (Originally 10/22/2021)   COLONOSCOPY (Pts 45-63yr Insurance coverage will need to be confirmed)  07/13/2022 (Originally 01/09/2017)   HIV Screening  11/18/2022 (Originally 10/23/1970)   INFLUENZA VACCINE  02/13/2022   OPHTHALMOLOGY EXAM  03/07/2022   MAMMOGRAM  03/27/2022   FOOT EXAM  04/27/2022   HEMOGLOBIN A1C  05/20/2022   PAP SMEAR-Modifier  10/26/2025   TETANUS/TDAP  07/27/2029   Hepatitis C Screening   Completed   HPV VACCINES  Aged Out    Health Maintenance  Health Maintenance Due  Topic Date Due   DEXA SCAN  10/22/2021    Colorectal cancer screening: Type of screening: Colonoscopy. Completed 01/10/2012. Repeat every 5 years  Mammogram status: Completed 03/27/2021. Repeat every year  Bone Density status: Ordered 01/22/2022. Pt provided with contact info and advised to call to schedule appt.  Lung Cancer Screening: (Low Dose CT Chest recommended if Age 65-80years, 30 pack-year currently smoking OR have quit w/in 15years.) does not qualify.   Additional Screening:  Hepatitis C Screening: does qualify; Completed 01/30/2016  Vision Screening: Recommended annual ophthalmology exams for early detection of glaucoma and other disorders of the eye. Is the patient up to date with their annual eye exam?  Yes  Who is the provider or what is the name of the office in which the patient attends annual eye exams? [facility] If pt is not established with a provider, would they like to be referred to a provider to establish care? No .   Dental Screening: Recommended annual dental exams for proper oral hygiene  Community Resource Referral / Chronic Care Management: CRR required this visit?  No   CCM required this visit?  No      Plan:     I have personally reviewed and noted the following in the patient's chart:   Medical and social history Use of alcohol, tobacco or illicit drugs  Current medications and supplements including opioid prescriptions.  Functional ability and status Nutritional status Physical activity Advanced directives List of other physicians Hospitalizations, surgeries, and ER visits in previous 12 months Vitals Screenings to include cognitive, depression, and falls Referrals and appointments  In addition, I have reviewed and discussed with patient certain preventive protocols, quality metrics, and best practice recommendations. A written personalized care  plan for preventive services as well as general preventive health recommendations were provided to patient.     MChriss Driver LPN   76/07/930  Nurse Notes: Discussed Colonoscopy and need for repeat. Pt deferred at this time. Discussed DEXA. Pt would like to repeat. Order placed. Order also placed for Mammogram.

## 2022-01-24 ENCOUNTER — Ambulatory Visit: Payer: Self-pay | Admitting: *Deleted

## 2022-01-24 NOTE — Patient Instructions (Signed)
Melissa Schneider  I have enjoyed working with you through the Chronic Care Management Program at [facility]. Due to program changes I am removing myself from your care team because you've either met our goals, your conditions are stable and no longer require care management, or we haven't engaged within the past 6 months. If you are currently active with another CCM Team Member, you will remain active with them unless they reach out to you with additional information. If you feel that you need RN Care Management services in the future, please talk with your primary care provider to discuss re-engagement with the RN Care Manager that will be assigned to [name]. This does not affect your status as a patient at [name].   Thank you for allowing me to participate in your your healthcare journey.  Chong Sicilian, BSN, RN-BC Embedded Chronic Care Manager [facility] [place] Management Direct Dial: [phone]

## 2022-01-24 NOTE — Chronic Care Management (AMB) (Signed)
  Chronic Care Management   Note  01/24/2022 Name: Melissa Schneider MRN: [id number] DOB: 1956-06-09   Patient is stable from [place] Management perspective or has not recently engaged with the [place]. I am removing RN Care Manager from Care Team and closing [place]. If patient is currently engaged with another CCM team member I will forward this encounter to inform them of my case closure. Patient may be eligible for re-engagement with RN Care Manager in the future if necessary and can discuss this with their PCP.  Chong Sicilian, BSN, RN-BC Embedded Chronic Care Manager [facility] / [place] Management Direct Dial: [phone]

## 2022-01-29 NOTE — Progress Notes (Signed)
Virtual Visit via Telephone Note  I connected with  Melissa Schneider on 01/29/22 at  2:45 PM EDT by telephone and verified that I am speaking with the correct person using two identifiers.  Location: Patient: HOME Provider: [facility] Persons participating in the virtual visit: patient/Nurse Health Advisor   I discussed the limitations, risks, security and privacy concerns of performing an evaluation and management service by telephone and the availability of in person appointments. The patient expressed understanding and agreed to proceed.  Interactive audio and video telecommunications were attempted between this nurse and patient, however failed, due to patient having technical difficulties OR patient did not have access to video capability.  We continued and completed visit with audio only.  Some vital signs may be absent or patient reported.   Chriss Driver, LPN

## 2022-02-19 ENCOUNTER — Inpatient Hospital Stay: Admission: RE | Admit: 2022-02-19 | Payer: PPO | Source: Ambulatory Visit

## 2022-02-19 ENCOUNTER — Other Ambulatory Visit: Payer: PPO

## 2022-02-19 ENCOUNTER — Ambulatory Visit: Payer: PPO | Admitting: Family Medicine

## 2022-02-20 ENCOUNTER — Ambulatory Visit (INDEPENDENT_AMBULATORY_CARE_PROVIDER_SITE_OTHER): Payer: PPO | Admitting: Family Medicine

## 2022-02-20 ENCOUNTER — Encounter: Payer: Self-pay | Admitting: Family Medicine

## 2022-02-20 VITALS — BP 117/72 | HR 68 | Temp 98.0°F | Ht 62.0 in | Wt 193.0 lb

## 2022-02-20 DIAGNOSIS — E1142 Type 2 diabetes mellitus with diabetic polyneuropathy: Secondary | ICD-10-CM | POA: Diagnosis not present

## 2022-02-20 DIAGNOSIS — I152 Hypertension secondary to endocrine disorders: Secondary | ICD-10-CM

## 2022-02-20 DIAGNOSIS — E1159 Type 2 diabetes mellitus with other circulatory complications: Secondary | ICD-10-CM | POA: Diagnosis not present

## 2022-02-20 DIAGNOSIS — E785 Hyperlipidemia, unspecified: Secondary | ICD-10-CM

## 2022-02-20 DIAGNOSIS — E1169 Type 2 diabetes mellitus with other specified complication: Secondary | ICD-10-CM | POA: Diagnosis not present

## 2022-02-20 DIAGNOSIS — Z794 Long term (current) use of insulin: Secondary | ICD-10-CM | POA: Diagnosis not present

## 2022-02-20 DIAGNOSIS — E039 Hypothyroidism, unspecified: Secondary | ICD-10-CM

## 2022-02-20 LAB — BAYER DCA HB A1C WAIVED: HB A1C (BAYER DCA - WAIVED): 7.5 % — ABNORMAL HIGH (ref 4.8–5.6)

## 2022-02-20 MED ORDER — ROSUVASTATIN CALCIUM 20 MG PO TABS: 20.0000 mg | ORAL_TABLET | Freq: Every day | ORAL | 3 refills | Status: DC

## 2022-02-20 NOTE — Patient Instructions (Signed)
A1c up.  Go back up on the Lantus.  Reduce the Novolog with supper if you aren't eating full meals. Can go to 7 units on days you don't eat fully. Rosuvastatin sent for daily use for cholesterol but can reduce to every other day if it increases joint pain.  Let me know.

## 2022-02-20 NOTE — Progress Notes (Signed)
Subjective: CC:DM PCP: Melissa Norlander, DO PJA:[id number] R Kuyper is a 66 y.o. female presenting to clinic today for:  1. Type 2 Diabetes with hypertension, hyperlipidemia:  Reports that she reduced her Lantus recently because she had had a couple of hypoglycemic episodes into the 50s and 60s overnight.  She admits that she ate insufficient supper but still administered the full dose of NovoLog those days.  The Trulicity has impacted her ability to eat full meals.  She continues to utilize her freestyle libre frequently to monitor blood sugar and on average blood sugars have been running around 199.  She has not been compliant with the atorvastatin because it was causing myalgia so she discontinued.  Compliant with ARB  Last eye exam: UTD Last foot exam: UTD Last A1c:  Lab Results  Component Value Date   HGBA1C 7.1 (H) 11/17/2021   Nephropathy screen indicated?: UTD Last flu, zoster and/or pneumovax:  Immunization History  Administered Date(s) Administered   Influenza Whole 05/16/2010   Influenza,inj,Quad PF,6+ Mos 05/10/2015, 04/26/2016, 06/19/2017, 05/28/2018   Pneumococcal Conjugate-13 01/29/2014   Pneumococcal Polysaccharide-23 07/16/2008, 01/30/2016   Td 07/17/2007   Tdap 07/28/2019    ROS: No chest pain, shortness of breath or edema.  She is been having difficulty with hearing because her right hearing aid when out and she cannot get an appointment with her audiologist until October  2.  Hypothyroidism Compliant with Synthroid 137 mcg daily.  No reports of tremor or difficulty swallowing.  No heart palpitations reported  ROS: Per HPI  Allergies  Allergen Reactions   Codeine Nausea Only and Other (See Comments)    NAUSEA AND CAUSES HER TO HAVE BAD DREAMS   Gabapentin     Confusion    Lisinopril     Coughing   Past Medical History:  Diagnosis Date   Anemia 07/23/2011   Anxiety    Diabetes mellitus    Diabetic gastroparesis ([place]) 07/23/2011   Suspected    Headache(784.0)    Hearing difficulty    Hyperlipidemia    Hypothyroidism    Kidney stones    Microcytic anemia 07/23/2011   Thalassemia minor    ????? not really worked up per pt but daughter has it???    Current Outpatient Medications:    aspirin 81 MG tablet, Take 81 mg by mouth daily., Disp: , Rfl:    Continuous Blood Gluc Receiver (FREESTYLE LIBRE 2 READER) DEVI, Use to test blood sugar up to 6 times daily. DX: E11.9, Disp: 1 each, Rfl: 0   Continuous Blood Gluc Sensor (FREESTYLE LIBRE 2 SENSOR) MISC, USE TO TEST BLOOD SUGAR UP TO 6 TIMES DAILY, Disp: 2 each, Rfl: 11   docusate sodium (COLACE) 100 MG capsule, Take 200 mg by mouth daily as needed. , Disp: , Rfl:    Dulaglutide (TRULICITY) 3 QB/3.4LP SOPN, Inject 3 mg as directed once a week., Disp: 6 mL, Rfl: 4   glucose blood (GNP EASY TOUCH GLUCOSE TEST) test strip, EVERY DAY, Disp: 100 each, Rfl: 11   insulin aspart (NOVOLOG FLEXPEN) 100 UNIT/ML FlexPen, INJECT 10-17 UNITS SQ 3 TIMES DAILY WITHMEALS, Disp: 45 mL, Rfl: 3   insulin glargine (LANTUS) 100 UNIT/ML injection, Inject 0.1-0.15 mLs (10-15 Units total) into the skin at bedtime. Note dose change!, Disp: 15 mL, Rfl: 3   Insulin Pen Needle (PEN NEEDLES) 32G X 4 MM MISC, UAD E11.69, Disp: 100 each, Rfl: 3   Insulin Syringe-Needle U-100 (INSULIN SYRINGE .5CC/31GX5/16") 31G X 5/16" 0.5 ML  MISC, UAD with Lantus E11.69, Disp: 100 each, Rfl: 3   levocetirizine (XYZAL) 5 MG tablet, Take 1 tablet (5 mg total) by mouth at bedtime as needed for allergies (itching/ rash)., Disp: 90 tablet, Rfl: 0   levothyroxine (SYNTHROID) 137 MCG tablet, TAKE ONE TABLET EACH MORNING BEFORE BREAKFAST, Disp: 90 tablet, Rfl: 3   losartan (COZAAR) 25 MG tablet, TAKE ONE (1) TABLET EACH DAY, Disp: 90 tablet, Rfl: 3   Multiple Vitamin (MULTIVITAMIN WITH MINERALS) TABS tablet, Take 1 tablet by mouth daily., Disp: , Rfl:    SUMAtriptan (IMITREX) 100 MG tablet, May repeat in 2 hours if headache persists or recurs.,  Disp: 9 tablet, Rfl: 12   triamcinolone cream (KENALOG) 0.1 %, Apply 1 application. topically 2 (two) times daily. X7-10 days for rash, Disp: 80 g, Rfl: 0   atorvastatin (LIPITOR) 40 MG tablet, Take 1 tablet (40 mg total) by mouth daily. 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   Occupation: Retired  Tobacco Use   Smoking status: Never   Smokeless tobacco: Never   Tobacco comments:    Never smoked  Vaping Use   Vaping Use: Never used  Substance and Sexual Activity   Alcohol use: No    Alcohol/week: 0.0 standard drinks of alcohol   Drug use: No   Sexual activity: Not Currently  Other Topics Concern   Not on file  Social History Narrative   Lives home with husband - Daughter lives in [place] - she has a great grandchild also   Social Determinants of Health   Financial Resource Strain: Low Risk  (01/22/2022)   Overall Financial Resource Strain (CARDIA)    Difficulty of Paying Living Expenses: Not hard at all  Food Insecurity: No [place] (01/22/2022)   Hunger Vital Sign    Worried About Running Out of Food in the Last Year: Never true    [place] in the Last Year: Never true  Transportation Needs: No Transportation Needs (01/22/2022)   PRAPARE - Hydrologist (Medical): No    Lack of Transportation (Non-Medical): No  Physical Activity: Sufficiently Active (01/22/2022)   Exercise Vital Sign    Days of Exercise per Week: 7 days    Minutes of Exercise per Session: 30 min  Stress: No Stress Concern Present (01/22/2022)   [place]    Feeling of Stress : Not at all   Social Connections: [place] (01/22/2022)   Social Connection and Isolation Panel [NHANES]    Frequency of Communication with Friends and Family: More than three times a week    Frequency of Social Gatherings with Friends and Family: Once a week    Attends Religious Services: More than 4 times per year    Active Member of [employer] or Organizations: Yes    Attends Music therapist: More than 4 times per year    Marital Status: Married  Human resources officer Violence: Not At Risk (01/22/2022)   Humiliation, Afraid, Rape, and Kick questionnaire    Fear of Current or Ex-Partner: No    Emotionally Abused: No  Physically Abused: No    Sexually Abused: No   Family History  Problem Relation Age of Onset   Colon cancer Maternal Grandmother        age 74   Colon polyps Mother    CAD Brother    Rheum arthritis Sister     Objective: Office vital signs reviewed. BP 117/72   Pulse 68   Temp 98 F (36.7 C)   Ht '5\' 2"'$  (1.575 m)   Wt 193 lb (87.5 kg)   SpO2 98%   BMI 35.30 kg/m   Physical Examination:  General: Awake, alert, well nourished, No acute distress HEENT: Sclera white.  No exophthalmos Cardio: regular rate and rhythm, S1S2 heard, no murmurs appreciated Pulm: clear to auscultation bilaterally, no wheezes, rhonchi or rales; normal work of breathing on room air MSK: Ambulating independently with normal tone, normal gait  Assessment/ Plan: 66 y.o. female   Type 2 diabetes mellitus with other specified complication, with long-term current use of insulin ([place]) - Plan: Bayer DCA Hb A1c Waived  Diabetic polyneuropathy associated with type 2 diabetes mellitus (HCC)  Hyperlipidemia associated with type 2 diabetes mellitus ([place]) - Plan: rosuvastatin (CRESTOR) 20 MG tablet  Hypertension associated with diabetes ([place])  Acquired hypothyroidism - Plan: TSH, T4, free  A1c is not controlled and rising at the 7.5 today from 7.1.  Advised her to advance her Lantus back  to previous dosing.  Okay to reduce NovoLog at suppertime to 7 units if not eating her full meal.  Continue to monitor for hypoglycemic episodes and if these recur with this regimen she is to contact me.  Discontinue atorvastatin, start rosuvastatin.  May consider pulsed dosing if recurrent polyarthralgia occurs..  Blood pressure is controlled.  No changes.  Asymptomatic from a thyroid standpoint.  Check thyroid levels  Orders Placed This Encounter  Procedures   Bayer DCA Hb A1c Waived   TSH   T4, free   No orders of the defined types were placed in this encounter.    Melissa Norlander, DO [place] [phone]

## 2022-02-21 ENCOUNTER — Other Ambulatory Visit: Payer: Self-pay | Admitting: Family Medicine

## 2022-02-21 ENCOUNTER — Other Ambulatory Visit: Payer: PPO

## 2022-02-21 ENCOUNTER — Ambulatory Visit: Payer: PPO | Admitting: Family Medicine

## 2022-02-21 LAB — TSH: TSH: 2.93 u[IU]/mL (ref 0.450–4.500)

## 2022-02-21 LAB — T4, FREE: Free T4: 1.57 ng/dL (ref 0.82–1.77)

## 2022-03-22 ENCOUNTER — Encounter: Payer: Self-pay | Admitting: Family Medicine

## 2022-03-23 ENCOUNTER — Other Ambulatory Visit: Payer: Self-pay | Admitting: Family Medicine

## 2022-03-23 DIAGNOSIS — Z794 Long term (current) use of insulin: Secondary | ICD-10-CM

## 2022-03-23 MED ORDER — FREESTYLE LIBRE 3 SENSOR MISC: 3 refills | Status: DC

## 2022-04-15 DEATH — deceased

## 2022-04-20 DIAGNOSIS — H903 Sensorineural hearing loss, bilateral: Secondary | ICD-10-CM | POA: Diagnosis not present

## 2022-04-30 ENCOUNTER — Other Ambulatory Visit: Payer: Self-pay | Admitting: Family Medicine

## 2022-04-30 DIAGNOSIS — E1169 Type 2 diabetes mellitus with other specified complication: Secondary | ICD-10-CM

## 2022-05-04 DIAGNOSIS — H903 Sensorineural hearing loss, bilateral: Secondary | ICD-10-CM | POA: Diagnosis not present

## 2022-05-21 ENCOUNTER — Ambulatory Visit
Admission: RE | Admit: 2022-05-21 | Discharge: 2022-05-21 | Disposition: A | Discharge status: Home / Self Care | Payer: PPO | Source: Ambulatory Visit | Attending: Family Medicine | Admitting: Family Medicine

## 2022-05-21 DIAGNOSIS — Z1231 Encounter for screening mammogram for malignant neoplasm of breast: Secondary | ICD-10-CM

## 2022-05-22 ENCOUNTER — Ambulatory Visit (INDEPENDENT_AMBULATORY_CARE_PROVIDER_SITE_OTHER): Payer: PPO | Admitting: Family Medicine

## 2022-05-22 ENCOUNTER — Encounter: Payer: Self-pay | Admitting: Family Medicine

## 2022-05-22 ENCOUNTER — Ambulatory Visit (INDEPENDENT_AMBULATORY_CARE_PROVIDER_SITE_OTHER): Payer: PPO

## 2022-05-22 VITALS — BP 138/70 | HR 74 | Temp 98.1°F | Ht 62.0 in | Wt 193.8 lb

## 2022-05-22 DIAGNOSIS — Z794 Long term (current) use of insulin: Secondary | ICD-10-CM | POA: Diagnosis not present

## 2022-05-22 DIAGNOSIS — Z780 Asymptomatic menopausal state: Secondary | ICD-10-CM | POA: Diagnosis not present

## 2022-05-22 DIAGNOSIS — R718 Other abnormality of red blood cells: Secondary | ICD-10-CM

## 2022-05-22 DIAGNOSIS — Z6379 Other stressful life events affecting family and household: Secondary | ICD-10-CM

## 2022-05-22 DIAGNOSIS — E1169 Type 2 diabetes mellitus with other specified complication: Secondary | ICD-10-CM | POA: Diagnosis not present

## 2022-05-22 DIAGNOSIS — E1159 Type 2 diabetes mellitus with other circulatory complications: Secondary | ICD-10-CM | POA: Diagnosis not present

## 2022-05-22 DIAGNOSIS — E785 Hyperlipidemia, unspecified: Secondary | ICD-10-CM

## 2022-05-22 DIAGNOSIS — E1142 Type 2 diabetes mellitus with diabetic polyneuropathy: Secondary | ICD-10-CM

## 2022-05-22 DIAGNOSIS — I152 Hypertension secondary to endocrine disorders: Secondary | ICD-10-CM | POA: Diagnosis not present

## 2022-05-22 LAB — BAYER DCA HB A1C WAIVED: HB A1C (BAYER DCA - WAIVED): 7.6 % — ABNORMAL HIGH (ref 4.8–5.6)

## 2022-05-22 MED ORDER — TRULICITY 4.5 MG/0.5ML ~~LOC~~ SOAJ: 4.5000 mg | SUBCUTANEOUS | 99 refills | Status: DC

## 2022-05-22 NOTE — Progress Notes (Signed)
Subjective: CC:DM PCP: Janora Norlander, DO GDJ:[id number] Melissa Schneider is a 66 y.o. female presenting to clinic today for:  1. Type 2 Diabetes with hypertension, hyperlipidemia:  Compliant w/ all medications.  Typically injects 12 unit of Lantus each night and sliding scale insulin with meals.  She is compliant with Trulicity 3 mg each week, Crestor 20 mg daily, Cozaar 25 mg daily. She uses a freestyle libre to check her blood sugar frequently.  She had a couple of low blood sugars into the 60s that occurred overnight.  Typical a.m. blood sugars in the 120s.  Last eye exam: ROI completed Last foot exam: needs Last A1c:  Lab Results  Component Value Date   HGBA1C 7.5 (H) 02/20/2022   Nephropathy screen indicated?: needs Last flu, zoster and/or pneumovax:  Immunization History  Administered Date(s) Administered   Influenza Whole 05/16/2010   Influenza,inj,Quad PF,6+ Mos 05/10/2015, 04/26/2016, 06/19/2017, 05/28/2018   Pneumococcal Conjugate-13 01/29/2014   Pneumococcal Polysaccharide-23 07/16/2008, 01/30/2016   Td 07/17/2007   Tdap 07/28/2019    ROS: No chest pain, shortness of breath, visual disturbance.  No foot ulcers.  Does report some increased stress related to caring for a set of twin 65 year old, 1 of which has what seems like early dementia.   ROS: Per HPI  Allergies  Allergen Reactions   Codeine Nausea Only and Other (See Comments)    NAUSEA AND CAUSES HER TO HAVE BAD DREAMS   Gabapentin     Confusion    Lisinopril     Coughing   Past Medical History:  Diagnosis Date   Anemia 07/23/2011   Anxiety    Diabetes mellitus    Diabetic gastroparesis ([place]) 07/23/2011   Suspected   Headache(784.0)    Hearing difficulty    Hyperlipidemia    Hypothyroidism    Kidney stones    Microcytic anemia 07/23/2011   Thalassemia minor    ????? not really worked up per pt but daughter has it???    Current Outpatient Medications:    aspirin 81 MG tablet, Take 81 mg by mouth  daily., Disp: , Rfl:    Continuous Blood Gluc Sensor (FREESTYLE LIBRE 3 SENSOR) MISC, Place 1 sensor on the skin every 14 days. Use to check glucose continuously E11.69, Disp: 6 each, Rfl: 3   docusate sodium (COLACE) 100 MG capsule, Take 200 mg by mouth daily as needed. , Disp: , Rfl:    Dulaglutide (TRULICITY) 3 HD/6.2IW SOPN, Inject 3 mg as directed once a week., Disp: 6 mL, Rfl: 4   glucose blood (GNP EASY TOUCH GLUCOSE TEST) test strip, EVERY DAY, Disp: 100 each, Rfl: 11   insulin aspart (NOVOLOG FLEXPEN) 100 UNIT/ML FlexPen, INJECT 10-17 UNITS SQ 3 TIMES DAILY WITH MEALS, Disp: 45 mL, Rfl: 0   Insulin Pen Needle (PEN NEEDLES) 32G X 4 MM MISC, UAD E11.69, Disp: 100 each, Rfl: 3   Insulin Syringe-Needle U-100 (INSULIN SYRINGE .5CC/31GX5/16") 31G X 5/16" 0.5 ML MISC, UAD with Lantus E11.69, Disp: 100 each, Rfl: 3   LANTUS 100 UNIT/ML injection, INJECT 0.22-0.3MLS (22-30 UNITS) INTO THE SKIN AT BEDTIME, Disp: 30 mL, Rfl: 0   levocetirizine (XYZAL) 5 MG tablet, Take 1 tablet (5 mg total) by mouth at bedtime as needed for allergies (itching/ rash)., Disp: 90 tablet, Rfl: 0   levothyroxine (SYNTHROID) 137 MCG tablet, TAKE ONE TABLET EACH MORNING BEFORE BREAKFAST, Disp: 90 tablet, Rfl: 3   losartan (COZAAR) 25 MG tablet, TAKE ONE (1) TABLET EACH DAY, Disp: 90  tablet, Rfl: 3   Multiple Vitamin (MULTIVITAMIN WITH MINERALS) TABS tablet, Take 1 tablet by mouth daily., Disp: , Rfl:    rosuvastatin (CRESTOR) 20 MG tablet, Take 1 tablet (20 mg total) by mouth daily., Disp: 90 tablet, Rfl: 3   SUMAtriptan (IMITREX) 100 MG tablet, May repeat in 2 hours if headache persists or recurs., Disp: 9 tablet, Rfl: 12   triamcinolone cream (KENALOG) 0.1 %, Apply 1 application. topically 2 (two) times daily. 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   Occupation: Retired  Tobacco Use   Smoking status: Never   Smokeless tobacco: Never   Tobacco comments:    Never smoked  Vaping Use   Vaping Use: Never used  Substance and Sexual Activity   Alcohol use: No    Alcohol/week: 0.0 standard drinks of alcohol   Drug use: No   Sexual activity: Not Currently  Other Topics Concern   Not on file  Social History Narrative   Lives home with husband - Daughter lives in [place] - she has a great grandchild also   Social Determinants of Health   Financial Resource Strain: Low Risk  (01/22/2022)   Overall Financial Resource Strain (CARDIA)    Difficulty of Paying Living Expenses: Not hard at all  Food Insecurity: No [place] (01/22/2022)   Hunger Vital Sign    Worried About Running Out of Food in the Last Year: Never true    [place] in the Last Year: Never true  Transportation Needs: No Transportation Needs (01/22/2022)   PRAPARE - Hydrologist (Medical): No    Lack of Transportation (Non-Medical): No  Physical Activity: Sufficiently Active (01/22/2022)   Exercise Vital Sign    Days of Exercise per Week: 7 days    Minutes of Exercise per Session: 30 min  Stress: No Stress Concern Present (01/22/2022)   [place]    Feeling of Stress : Not at all  Social Connections: [place] (01/22/2022)   Social Connection and Isolation Panel [NHANES]    Frequency of Communication with Friends and Family: More than three times a week    Frequency of Social Gatherings with Friends and Family: Once a week    Attends Religious Services: More than 4 times per year    Active Member of [employer] or Organizations: Yes    Attends Music therapist: More than 4 times per year    Marital Status: Married  Human resources officer  Violence: Not At Risk (01/22/2022)   Humiliation, Afraid, Rape, and Kick questionnaire    Fear of Current or Ex-Partner: No    Emotionally Abused: No    Physically Abused: No    Sexually Abused: No   Family History  Problem Relation Age of Onset   Colon polyps Mother    Rheum arthritis Sister    Breast cancer Maternal Aunt    Breast cancer Maternal Aunt    Colon cancer Maternal Grandmother        age 70   CAD Brother     Objective: Office vital signs reviewed. BP 138/70  Pulse 74   Temp 98.1 F (36.7 C)   Ht '5\' 2"'$  (1.575 m)   Wt 193 lb 12.8 oz (87.9 kg)   SpO2 98%   BMI 35.45 kg/m   Physical Examination:  General: Awake, alert, well nourished, No acute distress HEENT: sclera white, MMM Cardio: regular rate and rhythm, S1S2 heard, no murmurs appreciated Pulm: clear to auscultation bilaterally, no wheezes, rhonchi or rales; normal work of breathing on room air Psych: Very pleasant, interactive. Neuro: Hard of hearing  Assessment/ Plan: 66 y.o. female   Type 2 diabetes mellitus with other specified complication, with long-term current use of insulin ([place]) - Plan: Bayer DCA Hb A1c Waived, Microalbumin / creatinine urine ratio, Dulaglutide (TRULICITY) 4.5 YP/9.5KD SOPN  Diabetic polyneuropathy associated with type 2 diabetes mellitus ([place])  Hyperlipidemia associated with type 2 diabetes mellitus ([place]) - Plan: Lipid Panel  Hypertension associated with diabetes ([place])  Low mean corpuscular volume (MCV) - Plan: Iron, CBC  Stress due to illness of family member  Sugar not at goal with A1c rising to 7.6.  Advance Trulicity to 4.5 weekly.  Hopefully this will allow for less insulin and perhaps reduced hypoglycemic episodes.  Continue using freestyle libre.  Urine microalbumin collected.  Fasting lipid.  Continue Crestor.  Blood pressure controlled.  No changes.  Continue Cozaar  Check iron and CBC given low MCV in the past.  No interventions planned currently for  stress due to caregiving as she has a solid plan in place at this time.  She will reach out to me if needed prior to her next visit in 3 months  Orders Placed This Encounter  Procedures   Bayer DCA Hb A1c Waived   Microalbumin / creatinine urine ratio   Lipid Panel   Iron   CBC   No orders of the defined types were placed in this encounter.    Janora Norlander, DO [place] [phone]

## 2022-05-23 DIAGNOSIS — M8588 Other specified disorders of bone density and structure, other site: Secondary | ICD-10-CM | POA: Diagnosis not present

## 2022-05-23 LAB — LIPID PANEL
Chol/HDL Ratio: 2.9 ratio (ref 0.0–4.4)
Cholesterol, Total: 260 mg/dL — ABNORMAL HIGH (ref 100–199)
HDL: 90 mg/dL (ref >39)
LDL Chol Calc (NIH): 157 mg/dL — ABNORMAL HIGH (ref 0–99)
Triglycerides: 80 mg/dL (ref 0–149)
VLDL Cholesterol Cal: 13 mg/dL (ref 5–40)

## 2022-05-23 LAB — MICROALBUMIN / CREATININE URINE RATIO
Creatinine, Urine: 69.1 mg/dL
Microalb/Creat Ratio: <4 mg/g creat (ref 0–29)
Microalbumin, Urine: <3 ug/mL

## 2022-05-23 LAB — CBC
Hematocrit: 40.7 % (ref 34.0–46.6)
Hemoglobin: 12.5 g/dL (ref 11.1–15.9)
MCH: 20.8 pg — ABNORMAL LOW (ref 26.6–33.0)
MCHC: 30.7 g/dL — ABNORMAL LOW (ref 31.5–35.7)
MCV: 68 fL — ABNORMAL LOW (ref 79–97)
Platelets: 329 10*3/uL (ref 150–450)
RBC: 6.01 x10E6/uL — ABNORMAL HIGH (ref 3.77–5.28)
RDW: 16.3 % — ABNORMAL HIGH (ref 11.7–15.4)
WBC: 6.5 10*3/uL (ref 3.4–10.8)

## 2022-05-23 LAB — IRON: Iron: 88 ug/dL (ref 27–139)

## 2022-06-04 ENCOUNTER — Other Ambulatory Visit: Payer: Self-pay | Admitting: Family Medicine

## 2022-07-02 ENCOUNTER — Other Ambulatory Visit: Payer: Self-pay | Admitting: Family Medicine

## 2022-07-02 DIAGNOSIS — E039 Hypothyroidism, unspecified: Secondary | ICD-10-CM

## 2022-07-02 DIAGNOSIS — Z794 Long term (current) use of insulin: Secondary | ICD-10-CM

## 2022-08-07 ENCOUNTER — Other Ambulatory Visit: Payer: Self-pay | Admitting: Family Medicine

## 2022-08-07 DIAGNOSIS — E1169 Type 2 diabetes mellitus with other specified complication: Secondary | ICD-10-CM

## 2022-09-03 ENCOUNTER — Ambulatory Visit: Payer: PPO | Admitting: Family Medicine

## 2022-09-03 NOTE — Progress Notes (Deleted)
Subjective: CC:DM PCP: Melissa Norlander, DO [id number] R Fewell is a 67 y.o. female presenting to clinic today for:  1. Type 2 Diabetes with hypertension, hyperlipidemia:  Glucometer:***.   High at home: ***; Low at home: ***, Taking medication(s): ***,.  Last eye exam: *** Last foot exam: *** Last A1c:  Lab Results  Component Value Date   HGBA1C 7.6 (H) 05/22/2022   Nephropathy screen indicated?: *** Last flu, zoster and/or pneumovax:  Immunization History  Administered Date(s) Administered   Influenza Whole 05/16/2010   Influenza,inj,Quad PF,6+ Mos 05/10/2015, 04/26/2016, 06/19/2017, 05/28/2018   Pneumococcal Conjugate-13 01/29/2014   Pneumococcal Polysaccharide-23 07/16/2008, 01/30/2016   Td 07/17/2007   Tdap 07/28/2019    ROS: ***dizziness, LOC, polyuria, polydipsia, unintended weight loss/gain, foot ulcerations, numbness or tingling in extremities, shortness of breath or chest pain.  2. Hypothyroidism ***     ROS: Per HPI  Allergies  Allergen Reactions   Codeine Nausea Only and Other (See Comments)    NAUSEA AND CAUSES HER TO HAVE BAD DREAMS   Gabapentin     Confusion    Lisinopril     Coughing   Past Medical History:  Diagnosis Date   Anemia 07/23/2011   Anxiety    Diabetes mellitus    Diabetic gastroparesis ([place]) 07/23/2011   Suspected   Headache(784.0)    Hearing difficulty    Hyperlipidemia    Hypothyroidism    Kidney stones    Microcytic anemia 07/23/2011   Thalassemia minor    ????? not really worked up per pt but daughter has it???    Current Outpatient Medications:    aspirin 81 MG tablet, Take 81 mg by mouth daily., Disp: , Rfl:    Continuous Blood Gluc Sensor (FREESTYLE LIBRE 3 SENSOR) MISC, Place 1 sensor on the skin every 14 days. Use to check glucose continuously E11.69, Disp: 6 each, Rfl: 3   docusate sodium (COLACE) 100 MG capsule, Take 200 mg by mouth daily as needed. , Disp: , Rfl:    Dulaglutide (TRULICITY) 4.5 0000000  SOPN, Inject 4.5 mg as directed once a week., Disp: 6 mL, Rfl: PRN   glucose blood (GNP EASY TOUCH GLUCOSE TEST) test strip, EVERY DAY, Disp: 100 each, Rfl: 11   Insulin Pen Needle (PEN NEEDLES) 32G X 4 MM MISC, UAD E11.69, Disp: 100 each, Rfl: 3   Insulin Syringe-Needle U-100 (INSULIN SYRINGE .5CC/31GX5/16") 31G X 5/16" 0.5 ML MISC, UAD with Lantus E11.69, Disp: 100 each, Rfl: 3   LANTUS 100 UNIT/ML injection, INJECT 0.22-0.3MLS (22-30 UNITS) INTO THE SKIN AT BEDTIME, Disp: 30 mL, Rfl: 0   levocetirizine (XYZAL) 5 MG tablet, Take 1 tablet (5 mg total) by mouth at bedtime as needed for allergies (itching/ rash)., Disp: 90 tablet, Rfl: 0   levothyroxine (SYNTHROID) 137 MCG tablet, TAKE ONE TABLET EACH MORNING BEFORE BREAKFAST, Disp: 90 tablet, Rfl: 3   losartan (COZAAR) 25 MG tablet, TAKE ONE (1) TABLET EACH DAY, Disp: 90 tablet, Rfl: 3   Multiple Vitamin (MULTIVITAMIN WITH MINERALS) TABS tablet, Take 1 tablet by mouth daily., Disp: , Rfl:    NOVOLOG FLEXPEN 100 UNIT/ML FlexPen, INJECT 10-17 UNITS SQ 3 TIMES DAILY WITH MEALS, Disp: 45 mL, Rfl: 0   rosuvastatin (CRESTOR) 20 MG tablet, Take 1 tablet (20 mg total) by mouth daily., Disp: 90 tablet, Rfl: 3   SUMAtriptan (IMITREX) 100 MG tablet, TAKE 1 TABLET AT ONSET OF MIGRAINE HEADACHE MAY REPEAT ONCE IN 2 HOURS (MAX OF 2 TABLETS/24 HOURS), Disp:  9 tablet, Rfl: 0   triamcinolone cream (KENALOG) 0.1 %, Apply 1 application. topically 2 (two) times daily. 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   Occupation: Retired  Tobacco Use   Smoking status: Never   Smokeless tobacco: Never   Tobacco comments:    Never smoked  Vaping Use   Vaping Use: Never used   Substance and Sexual Activity   Alcohol use: No    Alcohol/week: 0.0 standard drinks of alcohol   Drug use: No   Sexual activity: Not Currently  Other Topics Concern   Not on file  Social History Narrative   Lives home with husband - Daughter lives in [place] - she has a great grandchild also   Social Determinants of Health   Financial Resource Strain: Low Risk  (01/22/2022)   Overall Financial Resource Strain (CARDIA)    Difficulty of Paying Living Expenses: Not hard at all  Food Insecurity: No [place] (01/22/2022)   Hunger Vital Sign    Worried About Running Out of Food in the Last Year: Never true    [place] in the Last Year: Never true  Transportation Needs: No Transportation Needs (01/22/2022)   PRAPARE - Hydrologist (Medical): No    Lack of Transportation (Non-Medical): No  Physical Activity: Sufficiently Active (01/22/2022)   Exercise Vital Sign    Days of Exercise per Week: 7 days    Minutes of Exercise per Session: 30 min  Stress: No Stress Concern Present (01/22/2022)   [place]    Feeling of Stress : Not at all  Social Connections: [place] (01/22/2022)   Social Connection and Isolation Panel [NHANES]    Frequency of Communication with Friends and Family: More than three times a week    Frequency of Social Gatherings with Friends and Family: Once a week    Attends Religious Services: More than 4 times per year    Active Member of [employer] or Organizations: Yes    Attends Music therapist: More than 4 times per year    Marital Status: Married  Human resources officer Violence: Not At Risk (01/22/2022)   Humiliation, Afraid, Rape, and Kick questionnaire    Fear of Current or Ex-Partner: No    Emotionally Abused: No    Physically Abused: No    Sexually Abused: No   Family History  Problem Relation Age of Onset   Colon polyps Mother    Rheum  arthritis Sister    Breast cancer Maternal Aunt    Breast cancer Maternal Aunt    Colon cancer Maternal Grandmother        age 53   CAD Brother     Objective: Office vital signs reviewed. There were no vitals taken for this visit.  Physical Examination:  General: Awake, alert, *** nourished, No acute distress HEENT: Normal    Neck: No masses palpated. No lymphadenopathy    Ears: Tympanic membranes intact, normal light reflex, no erythema, no bulging    Eyes: PERRLA, extraocular membranes intact, sclera ***    Nose: nasal turbinates moist, *** nasal discharge  Throat: moist mucus membranes, no erythema, *** tonsillar exudate.  Airway is patent Cardio: regular rate and rhythm, S1S2 heard, no murmurs appreciated Pulm: clear to auscultation bilaterally, no wheezes, rhonchi or rales; normal work of breathing on room air GI: soft, non-tender, non-distended, bowel sounds present x4, no hepatomegaly, no splenomegaly, no masses GU: external vaginal tissue ***, cervix ***, *** punctate lesions on cervix appreciated, *** discharge from cervical os, *** bleeding, *** cervical motion tenderness, *** abdominal/ adnexal masses Extremities: warm, well perfused, No edema, cyanosis or clubbing; +*** pulses bilaterally MSK: *** gait and *** station Skin: dry; intact; no rashes or lesions Neuro: *** Strength and light touch sensation grossly intact, *** DTRs ***/4  Assessment/ Plan: 67 y.o. female   ***  No orders of the defined types were placed in this encounter.  No orders of the defined types were placed in this encounter.    Melissa Norlander, DO [place] [phone]

## 2022-09-17 ENCOUNTER — Encounter: Payer: Self-pay | Admitting: Nurse Practitioner

## 2022-09-17 ENCOUNTER — Ambulatory Visit: Payer: PPO | Attending: Nurse Practitioner | Admitting: Nurse Practitioner

## 2022-09-17 VITALS — BP 128/80 | HR 72 | Ht 62.0 in | Wt 196.4 lb

## 2022-09-17 DIAGNOSIS — I10 Essential (primary) hypertension: Secondary | ICD-10-CM

## 2022-09-17 DIAGNOSIS — E785 Hyperlipidemia, unspecified: Secondary | ICD-10-CM

## 2022-09-17 DIAGNOSIS — I2510 Atherosclerotic heart disease of native coronary artery without angina pectoris: Secondary | ICD-10-CM | POA: Diagnosis not present

## 2022-09-17 DIAGNOSIS — E669 Obesity, unspecified: Secondary | ICD-10-CM

## 2022-09-17 DIAGNOSIS — Z87898 Personal history of other specified conditions: Secondary | ICD-10-CM | POA: Diagnosis not present

## 2022-09-17 NOTE — Patient Instructions (Signed)

## 2022-09-17 NOTE — Progress Notes (Signed)
Office Visit    Patient Name: Melissa Schneider Date of Encounter: 09/17/2022  PCP:  Janora Norlander [place]  Cardiologist:  Carlyle Dolly, MD Advanced Practice Provider:  Finis Bud, NP Electrophysiologist:  None   Chief Complaint    Melissa Schneider is a 67 y.o. female with a hx of CAD, history of chest pain, T2DM, HLD, and HTN who presents today for overdue 1 year follow-up.   Past Medical History    Past Medical History:  Diagnosis Date   Anemia 07/23/2011   Anxiety    Diabetes mellitus    Diabetic gastroparesis ([place]) 07/23/2011   Suspected   Headache(784.0)    Hearing difficulty    Hyperlipidemia    Hypothyroidism    Kidney stones    Microcytic anemia 07/23/2011   Thalassemia minor    ????? not really worked up per pt but daughter has it???   Past Surgical History:  Procedure Laterality Date   COLONOSCOPY  2004   Dr. Jim Desanctis: small fissue and hemorrhoids   COLONOSCOPY  12/2011   Dr. Paulita Fujita: normal   CYSTOSCOPY W/ URETERAL STENT PLACEMENT Right 08/13/2013   Procedure: CYSTOSCOPY WITH RETROGRADE PYELOGRAM/URETERAL STENT PLACEMENT;  Surgeon: Marissa Nestle, MD;  Location: [facility];  Service: Urology;  Laterality: Right;   EXTRACORPOREAL SHOCK WAVE LITHOTRIPSY Right 08/19/2013   Procedure: EXTRACORPOREAL SHOCK WAVE LITHOTRIPSY (ESWL) RIGHT URETERAL CALCULUS;  Surgeon: Marissa Nestle, MD;  Location: [facility];  Service: Urology;  Laterality: Right;   WRIST SURGERY Right     Allergies  Allergies  Allergen Reactions   Codeine Nausea Only and Other (See Comments)    NAUSEA AND CAUSES HER TO HAVE BAD DREAMS   Gabapentin     Confusion    Lisinopril     Coughing    History of Present Illness    Melissa Schneider is a 67 y.o. female with a PMH as mentioned above.   Previous negative NST in 2018, history of minimal CAD seen via coronary CTA in 2021, coronary calcium score of 1.  Last seen by Dr. Carlyle Dolly on  March 28, 2021.  Was doing well at the time.   Today she presents for overdue 1 year follow-up. Doing well.  Denies any changes to her health since last office visit. Denies any chest pain, shortness of breath, palpitations, syncope, presyncope, dizziness, orthopnea, PND, swelling or significant weight changes, acute bleeding, or claudication.  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  Volunteers with [employer].  EKGs/Labs/Other Studies Reviewed:   The following studies were reviewed today:   EKG:  EKG is ordered today.  The ekg ordered today demonstrates normal sinus rhythm, 73 bpm, no acute ischemic changes.  Recent Labs: 11/17/2021: ALT 20; BUN 17; Creatinine, Ser 0.87; Potassium 4.2; Sodium 140 02/20/2022: TSH 2.930 05/22/2022: Hemoglobin 12.5; Platelets 329  Recent Lipid Panel    Component Value Date/Time   CHOL 260 (H) 05/22/2022 0936   TRIG 80 05/22/2022 0936   HDL 90 05/22/2022 0936   CHOLHDL 2.9 05/22/2022 0936   LDLCALC 157 (H) 05/22/2022 0936    Risk Assessment/Calculations:    The 10-year ASCVD risk score (Arnett DK, et al., 2019) is: 13%   Values used to calculate the score:     Age: 89 years     Sex: Female     Is Non-Hispanic African American: No     Diabetic: Yes  Tobacco smoker: No     Systolic Blood Pressure: 0000000 mmHg     Is BP treated: Yes     HDL Cholesterol: 90 mg/dL     Total Cholesterol: 260 mg/dL   Home Medications   Current Meds  Medication Sig   aspirin 81 MG tablet Take 81 mg by mouth daily.   Continuous Blood Gluc Sensor (FREESTYLE LIBRE 3 SENSOR) MISC Place 1 sensor on the skin every 14 days. Use to check glucose continuously E11.69   docusate sodium (COLACE) 100 MG capsule Take 200 mg by mouth daily as needed.    Dulaglutide (TRULICITY) 4.5 0000000 SOPN Inject 4.5 mg as directed once a week.   glucose blood (GNP EASY TOUCH GLUCOSE TEST) test strip EVERY DAY   Insulin Pen Needle (PEN NEEDLES) 32G  X 4 MM MISC UAD E11.69   Insulin Syringe-Needle U-100 (INSULIN SYRINGE .5CC/31GX5/16") 31G X 5/16" 0.5 ML MISC UAD with Lantus E11.69   LANTUS 100 UNIT/ML injection INJECT 0.22-0.3MLS (22-30 UNITS) INTO THE SKIN AT BEDTIME   levocetirizine (XYZAL) 5 MG tablet Take 1 tablet (5 mg total) by mouth at bedtime as needed for allergies (itching/ rash).   levothyroxine (SYNTHROID) 137 MCG tablet TAKE ONE TABLET EACH MORNING BEFORE BREAKFAST   losartan (COZAAR) 25 MG tablet TAKE ONE (1) TABLET EACH DAY   Multiple Vitamin (MULTIVITAMIN WITH MINERALS) TABS tablet Take 1 tablet by mouth daily.   NOVOLOG FLEXPEN 100 UNIT/ML FlexPen INJECT 10-17 UNITS SQ 3 TIMES DAILY WITH MEALS   rosuvastatin (CRESTOR) 20 MG tablet Take 1 tablet (20 mg total) by mouth daily. (Patient taking differently: Take 20 mg by mouth daily. Takes 2-3 x per week)   SUMAtriptan (IMITREX) 100 MG tablet TAKE 1 TABLET AT ONSET OF MIGRAINE HEADACHE MAY REPEAT ONCE IN 2 HOURS (MAX OF 2 TABLETS/24 HOURS)   triamcinolone cream (KENALOG) 0.1 % Apply 1 application. topically 2 (two) times daily. X7-10 days for rash     Review of Systems    All other systems reviewed and are otherwise negative except as noted above.  Physical Exam    VS:  BP 128/80   Pulse 72   Ht '5\' 2"'$  (1.575 m)   Wt 196 lb 6.4 oz (89.1 kg)   SpO2 97%   BMI 35.92 kg/m  , BMI Body mass index is 35.92 kg/m.  Wt Readings from Last 3 Encounters:  09/17/22 196 lb 6.4 oz (89.1 kg)  05/22/22 193 lb 12.8 oz (87.9 kg)  02/20/22 193 lb (87.5 kg)     GEN: Obese, 67 y.o. female in no acute distress. HEENT: normal. Neck: Supple, no JVD, carotid bruits, or masses. Cardiac: S1/S2, RRR, no murmurs, rubs, or gallops. No clubbing, cyanosis, edema. Radials/PT 2+ and equal bilaterally.  Respiratory:  Respirations regular and unlabored, clear to auscultation bilaterally. GI: Soft, nontender, nondistended. MS: No deformity or atrophy. Skin: Warm and dry, no rash. Neuro:  Strength  and sensation are intact. Psych: Normal affect.  Assessment & Plan    CAD Stable with no anginal symptoms. No indication for ischemic evaluation.  Continue aspirin, losartan, and rosuvastatin. Heart healthy diet and regular cardiovascular exercise encouraged.   HTN Blood pressure stable.  Continue current medication regimen. Heart healthy diet and regular cardiovascular exercise encouraged.   HLD Has pending lipid panel arranged by PCP.  Will have labs done at upcoming PCP office.  Continue rosuvastatin. Heart healthy diet and regular cardiovascular exercise encouraged.   4.  Obesity BMI 35.92. Weight loss  via diet and exercise encouraged. Discussed the impact being overweight would have on cardiovascular risk.  Disposition: Follow up in 1 year(s) with Carlyle Dolly, MD or APP.  Signed, Finis Bud, NP 09/17/2022, 11:33 AM [place]

## 2022-10-03 ENCOUNTER — Encounter: Payer: Self-pay | Admitting: Family Medicine

## 2022-10-03 ENCOUNTER — Ambulatory Visit (INDEPENDENT_AMBULATORY_CARE_PROVIDER_SITE_OTHER): Payer: PPO | Admitting: Family Medicine

## 2022-10-03 VITALS — BP 129/72 | HR 79 | Temp 98.5°F | Ht 62.0 in | Wt 196.0 lb

## 2022-10-03 DIAGNOSIS — G8929 Other chronic pain: Secondary | ICD-10-CM | POA: Diagnosis not present

## 2022-10-03 DIAGNOSIS — E1159 Type 2 diabetes mellitus with other circulatory complications: Secondary | ICD-10-CM | POA: Diagnosis not present

## 2022-10-03 DIAGNOSIS — E1142 Type 2 diabetes mellitus with diabetic polyneuropathy: Secondary | ICD-10-CM

## 2022-10-03 DIAGNOSIS — Z1211 Encounter for screening for malignant neoplasm of colon: Secondary | ICD-10-CM | POA: Diagnosis not present

## 2022-10-03 DIAGNOSIS — E039 Hypothyroidism, unspecified: Secondary | ICD-10-CM | POA: Diagnosis not present

## 2022-10-03 DIAGNOSIS — E1169 Type 2 diabetes mellitus with other specified complication: Secondary | ICD-10-CM

## 2022-10-03 DIAGNOSIS — I152 Hypertension secondary to endocrine disorders: Secondary | ICD-10-CM

## 2022-10-03 DIAGNOSIS — E785 Hyperlipidemia, unspecified: Secondary | ICD-10-CM

## 2022-10-03 DIAGNOSIS — M25572 Pain in left ankle and joints of left foot: Secondary | ICD-10-CM | POA: Diagnosis not present

## 2022-10-03 DIAGNOSIS — Z794 Long term (current) use of insulin: Secondary | ICD-10-CM

## 2022-10-03 DIAGNOSIS — L819 Disorder of pigmentation, unspecified: Secondary | ICD-10-CM | POA: Diagnosis not present

## 2022-10-03 DIAGNOSIS — E119 Type 2 diabetes mellitus without complications: Secondary | ICD-10-CM | POA: Insufficient documentation

## 2022-10-03 LAB — BAYER DCA HB A1C WAIVED: HB A1C (BAYER DCA - WAIVED): 7.1 % — ABNORMAL HIGH (ref 4.8–5.6)

## 2022-10-03 MED ORDER — OZEMPIC (0.25 OR 0.5 MG/DOSE) 2 MG/3ML ~~LOC~~ SOPN: 0.5000 mg | PEN_INJECTOR | SUBCUTANEOUS | 3 refills | Status: DC

## 2022-10-03 MED ORDER — NOVOLOG FLEXPEN 100 UNIT/ML ~~LOC~~ SOPN: 10.0000 [IU] | PEN_INJECTOR | Freq: Three times a day (TID) | SUBCUTANEOUS | 99 refills | Status: DC

## 2022-10-03 MED ORDER — INSULIN GLARGINE 100 UNIT/ML ~~LOC~~ SOLN: SUBCUTANEOUS | 99 refills | Status: DC

## 2022-10-03 MED ORDER — "INSULIN SYRINGE 31G X 5/16"" 0.5 ML MISC": 3 refills | Status: AC

## 2022-10-03 MED ORDER — PEN NEEDLES 32G X 4 MM MISC: 3 refills | Status: DC

## 2022-10-03 MED ORDER — SUMATRIPTAN SUCCINATE 100 MG PO TABS: ORAL_TABLET | ORAL | 99 refills | Status: DC

## 2022-10-03 MED ORDER — LOSARTAN POTASSIUM 25 MG PO TABS: ORAL_TABLET | ORAL | 3 refills | Status: DC

## 2022-10-03 NOTE — Progress Notes (Signed)
Subjective: CC:DM PCP: Janora Norlander, DO NZ:[id number] Melissa Schneider is a 67 y.o. female presenting to clinic today for:  1. Type 2 Diabetes with hypertension, hyperlipidemia, medial ankle pain:  Patient reports compliance with all medications.  She reports that her insurance is no longer cover Trulicity but she is considering going to [employer].  She notes that her spouse is helped by Melissa Schneider for patient assistance and she wonders if she might qualify for this as well.  Typically she is utilizing about the same amount of Lantus that she was previously but roughly only does about 10 units with meals of the NovoLog.  She does not report any GI disturbance with this regimen.  She is compliant with her antihypertensive and cholesterol medication.  Her main concern today is some medial ankle pain where she is been having a burning sensation over the last 6+ months along the medial ankle, particularly with certain positions.  No falls.  Last eye exam: needs Last foot exam: needs Last A1c:  Lab Results  Component Value Date   HGBA1C 7.6 (H) 05/22/2022   Nephropathy screen indicated?: UTD Last flu, zoster and/or pneumovax:  Immunization History  Administered Date(s) Administered   Influenza Whole 05/16/2010   Influenza,inj,Quad PF,6+ Mos 05/10/2015, 04/26/2016, 06/19/2017, 05/28/2018   Pneumococcal Conjugate-13 01/29/2014   Pneumococcal Polysaccharide-23 07/16/2008, 01/30/2016   Td 07/17/2007   Tdap 07/28/2019    ROS: Denies dizziness, LOC, polyuria, polydipsia, unintended weight loss/gain, foot ulcerations, numbness or tingling in extremities, shortness of breath or chest pain.    ROS: Per HPI  Allergies  Allergen Reactions   Codeine Nausea Only and Other (See Comments)    NAUSEA AND CAUSES HER TO HAVE BAD DREAMS   Gabapentin     Confusion    Lisinopril     Coughing   Past Medical History:  Diagnosis Date   Anemia 07/23/2011   Anxiety    Diabetes mellitus    Diabetic  gastroparesis ([place]) 07/23/2011   Suspected   Headache(784.0)    Hearing difficulty    Hyperlipidemia    Hypothyroidism    Kidney stones    Microcytic anemia 07/23/2011   Thalassemia minor    ????? not really worked up per pt but daughter has it???    Current Outpatient Medications:    aspirin 81 MG tablet, Take 81 mg by mouth daily., Disp: , Rfl:    Continuous Blood Gluc Sensor (FREESTYLE LIBRE 3 SENSOR) MISC, Place 1 sensor on the skin every 14 days. Use to check glucose continuously E11.69, Disp: 6 each, Rfl: 3   docusate sodium (COLACE) 100 MG capsule, Take 200 mg by mouth daily as needed. , Disp: , Rfl:    Dulaglutide (TRULICITY) 4.5 0000000 SOPN, Inject 4.5 mg as directed once a week., Disp: 6 mL, Rfl: PRN   glucose blood (GNP EASY TOUCH GLUCOSE TEST) test strip, EVERY DAY, Disp: 100 each, Rfl: 11   Insulin Pen Needle (PEN NEEDLES) 32G X 4 MM MISC, UAD E11.69, Disp: 100 each, Rfl: 3   Insulin Syringe-Needle U-100 (INSULIN SYRINGE .5CC/31GX5/16") 31G X 5/16" 0.5 ML MISC, UAD with Lantus E11.69, Disp: 100 each, Rfl: 3   LANTUS 100 UNIT/ML injection, INJECT 0.22-0.3MLS (22-30 UNITS) INTO THE SKIN AT BEDTIME, Disp: 30 mL, Rfl: 0   levocetirizine (XYZAL) 5 MG tablet, Take 1 tablet (5 mg total) by mouth at bedtime as needed for allergies (itching/ rash)., Disp: 90 tablet, Rfl: 0   levothyroxine (SYNTHROID) 137 MCG tablet, TAKE ONE TABLET  EACH MORNING BEFORE BREAKFAST, Disp: 90 tablet, Rfl: 3   losartan (COZAAR) 25 MG tablet, TAKE ONE (1) TABLET EACH DAY, Disp: 90 tablet, Rfl: 3   Multiple Vitamin (MULTIVITAMIN WITH MINERALS) TABS tablet, Take 1 tablet by mouth daily., Disp: , Rfl:    NOVOLOG FLEXPEN 100 UNIT/ML FlexPen, INJECT 10-17 UNITS SQ 3 TIMES DAILY WITH MEALS, Disp: 45 mL, Rfl: 0   rosuvastatin (CRESTOR) 20 MG tablet, Take 1 tablet (20 mg total) by mouth daily. (Patient taking differently: Take 20 mg by mouth daily. Takes 2-3 x per week), Disp: 90 tablet, Rfl: 3   SUMAtriptan (IMITREX)  100 MG tablet, TAKE 1 TABLET AT ONSET OF MIGRAINE HEADACHE MAY REPEAT ONCE IN 2 HOURS (MAX OF 2 TABLETS/24 HOURS), Disp: 9 tablet, Rfl: 0   triamcinolone cream (KENALOG) 0.1 %, Apply 1 application. topically 2 (two) times daily. 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   Occupation: Retired  Tobacco Use   Smoking status: Never   Smokeless tobacco: Never   Tobacco comments:    Never smoked  Vaping Use   Vaping Use: Never used  Substance and Sexual Activity   Alcohol use: No    Alcohol/week: 0.0 standard drinks of alcohol   Drug use: No   Sexual activity: Not Currently  Other Topics Concern   Not on file  Social History Narrative   Lives home with husband - Daughter lives in [place] - she has a great grandchild also   Social Determinants of Health   Financial Resource Strain: Low Risk  (01/22/2022)   Overall Financial Resource Strain (CARDIA)    Difficulty of Paying Living Expenses: Not hard at all  Food Insecurity: No [place] (01/22/2022)   Hunger Vital Sign    Worried About Running Out of Food in the Last Year: Never true    [place] in the Last Year: Never true  Transportation Needs: No Transportation Needs (01/22/2022)   PRAPARE - Hydrologist (Medical): No    Lack of Transportation (Non-Medical): No  Physical Activity: Sufficiently Active (01/22/2022)   Exercise Vital Sign    Days of Exercise per Week: 7 days    Minutes of Exercise per Session: 30 min  Stress: No Stress Concern Present (01/22/2022)   [place]    Feeling of Stress : Not at all  Social Connections: [place] (01/22/2022)   Social Connection and Isolation Panel [NHANES]    Frequency of Communication with Friends and Family: More than three times a week    Frequency of Social Gatherings with Friends and Family: Once a week    Attends Religious Services: More than 4 times per year    Active Member of [employer] or Organizations: Yes    Attends Archivist Meetings: More than 4 times per year    Marital Status: Married  Human resources officer Violence: Not At Risk (01/22/2022)   Humiliation, Afraid, Rape, and Kick questionnaire    Fear of Current or Ex-Partner: No    Emotionally Abused: No    Physically Abused: No    Sexually Abused: No  Family History  Problem Relation Age of Onset   Colon polyps Mother    Rheum arthritis Sister    Breast cancer Maternal Aunt    Breast cancer Maternal Aunt    Colon cancer Maternal Grandmother        age 66   CAD Brother     Objective: Office vital signs reviewed. BP 129/72   Pulse 79   Temp 98.5 F (36.9 C)   Ht 5\' 2"  (1.575 m)   Wt 196 lb (88.9 kg)   SpO2 97%   BMI 35.85 kg/m   Physical Examination:  General: Awake, alert, obese, No acute distress HEENT:  sclera white, MMM Cardio: regular rate and rhythm, S1S2 heard, no murmurs appreciated Pulm: clear to auscultation bilaterally, no wheezes, rhonchi or rales; normal work of breathing on room air Extremities: warm, well perfused, No edema, cyanosis or clubbing; +2 pulses bilaterally MSK: Slightly antalgic gait.  Normal station.  She has no gross soft tissue swelling noted medially.  She has no palpable bony abnormalities and no tenderness palpation to the distal malleolus.  Seems to have preserved range of motion but pain with certain movements Skin: Multiple pigmented nevi  Diabetic Foot Exam - Simple   Simple Foot Form Diabetic Foot exam was performed with the following findings: Yes 10/03/2022  5:11 PM  Visual Inspection No deformities, no ulcerations, no other skin breakdown  bilaterally: Yes Sensation Testing Intact to touch and monofilament testing bilaterally: Yes Pulse Check Posterior Tibialis and Dorsalis pulse intact bilaterally: Yes Comments     Assessment/ Plan: 67 y.o. female   Type 2 diabetes mellitus with other specified complication, with long-term current use of insulin (HCC) - Plan: CMP14+EGFR, CBC, Bayer DCA Hb A1c Waived, CMP14+EGFR, CBC, Bayer DCA Hb A1c Waived, Insulin Pen Needle (PEN NEEDLES) 32G X 4 MM MISC, Insulin Syringe-Needle U-100 (INSULIN SYRINGE .5CC/31GX5/16") 31G X 5/16" 0.5 ML MISC, insulin glargine (LANTUS) 100 UNIT/ML injection, insulin aspart (NOVOLOG FLEXPEN) 100 UNIT/ML FlexPen, Semaglutide,0.25 or 0.5MG /DOS, (OZEMPIC, 0.25 OR 0.5 MG/DOSE,) 2 MG/3ML SOPN, AMB Referral to Chronic Care Management Services  Diabetic polyneuropathy associated with type 2 diabetes mellitus ([place]) - Plan: CMP14+EGFR, CBC, CMP14+EGFR, CBC, AMB Referral to Chronic Care Management Services  Hyperlipidemia associated with type 2 diabetes mellitus ([place]) - Plan: CMP14+EGFR, LDL Cholesterol, Direct, LDL Cholesterol, Direct, CMP14+EGFR, AMB Referral to Chronic Care Management Services  Hypertension associated with diabetes ([place]) - Plan: CMP14+EGFR, CMP14+EGFR, losartan (COZAAR) 25 MG tablet, AMB Referral to Chronic Care Management Services  Chronic pain of left ankle  Acquired hypothyroidism - Plan: TSH, T4, Schneider, TSH, T4, Schneider  Pigmented skin lesion - Plan: Ambulatory referral to Dermatology  Screen for colon cancer - Plan: Ambulatory referral to Gastroenterology  Sugar is borderline at 7.1 but is coming down.  I gave her a sample of the Ozempic and encouraged her to start it 0.5 mg 1 week following her very last Trulicity pen.  I will place CCM referral today in efforts to get some patient assistance for this patient for Ozempic.  Diabetic foot exam performed today.  Continue blood pressure and cholesterol medications.  Nonfasting LDL collected today.   Check liver enzymes and electrolytes  I gave her home physical therapy exercises for strengthening of that left ankle.  I offered referral to physical therapy versus sports medicine center for further evaluation under ultrasound.  She wants to defer this.  Did not start any medications like gabapentin due to history of intolerance.  Thyroid disorder was not  discussed in details much of the focus was on her diabetes and ankle pain.  She does have several pigmented skin lesions that she would like to have further evaluated by dermatology and referral has been placed to her spouse's specialist in [place]  Referral back to GI as well for colonoscopy   No orders of the defined types were placed in this encounter.  No orders of the defined types were placed in this encounter.    Janora Norlander, DO [place] [phone]

## 2022-10-04 LAB — CMP14+EGFR
ALT: 23 IU/L (ref 0–32)
AST: 22 IU/L (ref 0–40)
Albumin/Globulin Ratio: 1.5 (ref 1.2–2.2)
Albumin: 4 g/dL (ref 3.9–4.9)
Alkaline Phosphatase: 135 IU/L — ABNORMAL HIGH (ref 44–121)
BUN/Creatinine Ratio: 13 (ref 12–28)
BUN: 16 mg/dL (ref 8–27)
Bilirubin Total: 0.2 mg/dL (ref 0.0–1.2)
CO2: 21 mmol/L (ref 20–29)
Calcium: 9.3 mg/dL (ref 8.7–10.3)
Chloride: 103 mmol/L (ref 96–106)
Creatinine, Ser: 1.21 mg/dL — ABNORMAL HIGH (ref 0.57–1.00)
Globulin, Total: 2.6 g/dL (ref 1.5–4.5)
Glucose: 84 mg/dL (ref 70–99)
Potassium: 4.1 mmol/L (ref 3.5–5.2)
Sodium: 139 mmol/L (ref 134–144)
Total Protein: 6.6 g/dL (ref 6.0–8.5)
eGFR: 50 mL/min/{1.73_m2} — ABNORMAL LOW (ref >59)

## 2022-10-04 LAB — T4, FREE: Free T4: 0.98 ng/dL (ref 0.82–1.77)

## 2022-10-04 LAB — TSH: TSH: 7.1 u[IU]/mL — ABNORMAL HIGH (ref 0.450–4.500)

## 2022-10-04 LAB — CBC
Hematocrit: 40.5 % (ref 34.0–46.6)
Hemoglobin: 12.4 g/dL (ref 11.1–15.9)
MCH: 21 pg — ABNORMAL LOW (ref 26.6–33.0)
MCHC: 30.6 g/dL — ABNORMAL LOW (ref 31.5–35.7)
MCV: 69 fL — ABNORMAL LOW (ref 79–97)
Platelets: 332 10*3/uL (ref 150–450)
RBC: 5.91 x10E6/uL — ABNORMAL HIGH (ref 3.77–5.28)
RDW: 16.6 % — ABNORMAL HIGH (ref 11.7–15.4)
WBC: 7.4 10*3/uL (ref 3.4–10.8)

## 2022-10-04 LAB — LDL CHOLESTEROL, DIRECT: LDL Direct: 130 mg/dL — ABNORMAL HIGH (ref 0–99)

## 2022-10-05 ENCOUNTER — Telehealth: Payer: Self-pay

## 2022-10-05 NOTE — Progress Notes (Signed)
  Chronic Care Management   Note  10/05/2022 Name: Melissa Schneider MRN: [id number] DOB: 08/05/1955  Melissa Schneider is a 67 y.o. year old female who is a primary care patient of Janora Norlander, DO. I reached out to Amedeo Plenty by phone today in response to a referral sent by [name] PCP.  [name] was given information about Chronic Care Management services today including:  CCM service includes personalized support from designated clinical staff supervised by the physician, including individualized plan of care and coordination with other care providers 24/7 contact phone numbers for assistance for urgent and routine care needs. Service will only be billed when office clinical staff spend 20 minutes or more in a month to coordinate care. Only one practitioner may furnish and bill the service in a calendar month. The patient may stop CCM services at amy time (effective at the end of the month) by phone call to the office staff. The patient will be responsible for cost sharing (co-pay) or up to 20% of the service fee (after annual deductible is met)  Ms. Amedeo Plenty  agreedto scheduling an appointment with the CCM RN Case Manager   Follow up plan: Patient agreed to scheduled appointment with RN Case Manager on 10/08/2022 and Pharm d 11/02/2022(date/time).   Noreene Larsson, [place], [place] [address] Direct Dial: [phone] [email or web address]

## 2022-10-05 NOTE — Progress Notes (Signed)
  Chronic Care Management   Note  10/05/2022 Name: SHAKENIA PEREGOY MRN: [id number] DOB: [birth year]  FRANKLIN MCNEISH is a 67 y.o. year old female who is a primary care patient of Janora Norlander, DO. I reached out to Amedeo Plenty by phone today in response to a referral sent by Ms. Lincroft PCP.  The first contact attempt was unsuccessful.   Follow up plan: Additional outreach attempts will be made.  Noreene Larsson, [place], [place] [address] Direct Dial: [phone] [email or web address]

## 2022-10-08 ENCOUNTER — Ambulatory Visit (INDEPENDENT_AMBULATORY_CARE_PROVIDER_SITE_OTHER): Payer: PPO | Admitting: *Deleted

## 2022-10-08 ENCOUNTER — Other Ambulatory Visit: Payer: Self-pay | Admitting: *Deleted

## 2022-10-08 DIAGNOSIS — E1169 Type 2 diabetes mellitus with other specified complication: Secondary | ICD-10-CM

## 2022-10-08 DIAGNOSIS — I152 Hypertension secondary to endocrine disorders: Secondary | ICD-10-CM

## 2022-10-08 DIAGNOSIS — R946 Abnormal results of thyroid function studies: Secondary | ICD-10-CM

## 2022-10-08 DIAGNOSIS — R899 Unspecified abnormal finding in specimens from other organs, systems and tissues: Secondary | ICD-10-CM

## 2022-10-08 NOTE — Plan of Care (Signed)
Chronic Care Management Provider Comprehensive Care Plan    10/08/2022 Name: Melissa Schneider MRN: [id number] DOB: 04/17/1956  Referral to Chronic Care Management (CCM) services was placed by Provider:  Ronnie Doss DO on Date: 10/03/22.  Chronic Condition 1: HYPERTENSION Provider Assessment and Plan  Hypertension associated with diabetes ([place]) - Plan: CMP14+EGFR, CMP14+EGFR, losartan (COZAAR) 25 MG tablet, AMB Referral to Chronic Care Management Services   Expected Outcome/Goals Addressed This Visit (Provider CCM goals/Provider Assessment and plan  CCM (HYPERTENSION) EXPECTED OUTCOME: MONITOR, SELF-MANAGE AND REDUCE SYMPTOMS OF HYPERTENSION  Symptom Management Condition 1: Take medications as prescribed   Attend all scheduled provider appointments Call pharmacy for medication refills 3-7 days in advance of running out of medications Attend church or other social activities Perform all self care activities independently  Perform IADL's (shopping, preparing meals, housekeeping, managing finances) independently Call provider office for new concerns or questions  check blood pressure weekly choose a place to take my blood pressure (home, clinic or office, retail store) write blood pressure results in a log or diary keep a blood pressure log take blood pressure log to all doctor appointments keep all doctor appointments take medications for blood pressure exactly as prescribed report new symptoms to your doctor eat more whole grains, fruits and vegetables, lean meats and healthy fats Look over education in my chart- low sodium diet  Chronic Condition 2: DIABETES Provider Assessment and Plan Type 2 diabetes mellitus with other specified complication, with long-term current use of insulin ([place]) - Plan: CMP14+EGFR, CBC, Bayer DCA Hb A1c Waived, CMP14+EGFR, CBC, Bayer DCA Hb A1c Waived, Insulin Pen Needle (PEN NEEDLES) 32G X 4 MM MISC, Insulin Syringe-Needle U-100 (INSULIN SYRINGE  .5CC/31GX5/16") 31G X 5/16" 0.5 ML MISC, insulin glargine (LANTUS) 100 UNIT/ML injection, insulin aspart (NOVOLOG FLEXPEN) 100 UNIT/ML FlexPen, Semaglutide,0.25 or 0.5MG /DOS, (OZEMPIC, 0.25 OR 0.5 MG/DOSE,) 2 MG/3ML SOPN, AMB Referral to Chronic Care Management Services    Expected Outcome/Goals Addressed This Visit (Provider CCM goals/Provider Assessment and plan  CCM (DIABETES) EXPECTED OUTCOME: MONITOR, SELF-MANAGE AND REDUCE SYMPTOMS OF DIABETES  Symptom Management Condition 2: Take medications as prescribed   Attend all scheduled provider appointments Call pharmacy for medication refills 3-7 days in advance of running out of medications Attend church or other social activities Perform all self care activities independently  Perform IADL's (shopping, preparing meals, housekeeping, managing finances) independently Call provider office for new concerns or questions  check blood sugar at prescribed times: per [facility]  check feet daily for cuts, sores or redness enter blood sugar readings and medication or insulin into daily log take the blood sugar log to all doctor visits take the blood sugar meter to all doctor visits trim toenails straight across fill half of plate with vegetables limit fast food meals to no more than 1 per week manage portion size read food labels for fat, fiber, carbohydrates and portion size set a realistic goal Look over education in my chart- hypoglycemia Follow RULE OF 15 for low blood sugar management:  How to treat low blood sugars (Blood sugar less than 70 mg/dl  Please follow the RULE OF 15 for the treatment of hypoglycemia treatment (When your blood sugars are less than 70 mg/ dl) STEP  1:  Take 15 grams of carbohydrates when your blood sugar is low, which includes:   3-4 glucose tabs or  3-4 oz of juice or regular soda or  One tube of glucose gel STEP 2:  Recheck blood sugar in 15 minutes  STEP 3:  If your blood sugar is still low at the 15  minute recheck ---then, go back to STEP 1 and treat again with another 15 grams of carbohydrates  Problem List Patient Active Problem List   Diagnosis Date Noted   Diabetes mellitus ([place]) 10/03/2022   Hyperlipidemia associated with type 2 diabetes mellitus ([place]) 09/01/2018   Depressed mood 09/01/2018   Low back pain 04/26/2016   Hearing problem of both ears 01/30/2016   Hypertension associated with diabetes ([place]) 11/07/2015   BMI 35.0-35.9,adult 08/11/2015   Migraine 03/17/2015   Diabetic neuropathy ([place]) 03/17/2015   Constipation 04/05/2014   Microcytic anemia 04/05/2014   Recurrent obstructive pyelonephritis 08/14/2013   Obstructive uropathy 08/13/2013   Stone in kidney 08/13/2013   Diabetic gastroparesis ([place]) 07/23/2011   Hypothyroidism 08/24/2010   DM (diabetes mellitus), type 2, uncontrolled 08/24/2010    Medication Management  Current Outpatient Medications:    aspirin 81 MG tablet, Take 81 mg by mouth daily., Disp: , Rfl:    Continuous Blood Gluc Sensor (FREESTYLE LIBRE 3 SENSOR) MISC, Place 1 sensor on the skin every 14 days. Use to check glucose continuously E11.69, Disp: 6 each, Rfl: 3   docusate sodium (COLACE) 100 MG capsule, Take 200 mg by mouth daily as needed. , Disp: , Rfl:    glucose blood (GNP EASY TOUCH GLUCOSE TEST) test strip, EVERY DAY, Disp: 100 each, Rfl: 11   insulin aspart (NOVOLOG FLEXPEN) 100 UNIT/ML FlexPen, Inject 10-17 Units into the skin 3 (three) times daily with meals., Disp: 45 mL, Rfl: PRN   insulin glargine (LANTUS) 100 UNIT/ML injection, INJECT 0.22-0.3MLS (22-30 UNITS) INTO THE SKIN AT BEDTIME (Patient taking differently: 10 Units at bedtime. INJECT 0.22-0.3MLS (22-30 UNITS) INTO THE SKIN AT BEDTIME Pt taking 10 units at hs), Disp: 30 mL, Rfl: PRN   Insulin Pen Needle (PEN NEEDLES) 32G X 4 MM MISC, UAD E11.69, Disp: 100 each, Rfl: 3   Insulin Syringe-Needle U-100 (INSULIN SYRINGE .5CC/31GX5/16") 31G X 5/16" 0.5 ML MISC, UAD with Lantus E11.69,  Disp: 100 each, Rfl: 3   levocetirizine (XYZAL) 5 MG tablet, Take 1 tablet (5 mg total) by mouth at bedtime as needed for allergies (itching/ rash)., Disp: 90 tablet, Rfl: 0   levothyroxine (SYNTHROID) 137 MCG tablet, TAKE ONE TABLET EACH MORNING BEFORE BREAKFAST, Disp: 90 tablet, Rfl: 3   losartan (COZAAR) 25 MG tablet, TAKE ONE (1) TABLET EACH DAY, Disp: 90 tablet, Rfl: 3   Multiple Vitamin (MULTIVITAMIN WITH MINERALS) TABS tablet, Take 1 tablet by mouth daily., Disp: , Rfl:    rosuvastatin (CRESTOR) 20 MG tablet, Take 1 tablet (20 mg total) by mouth daily. (Patient taking differently: Take 20 mg by mouth daily. Takes 2-3 x per week), Disp: 90 tablet, Rfl: 3   Semaglutide,0.25 or 0.5MG /DOS, (OZEMPIC, 0.25 OR 0.5 MG/DOSE,) 2 MG/3ML SOPN, Inject 0.5 mg into the skin every 7 (seven) days., Disp: 9 mL, Rfl: 3   SUMAtriptan (IMITREX) 100 MG tablet, May repeat in 2 hours if headache persists or recurs., Disp: 9 tablet, Rfl: PRN   triamcinolone cream (KENALOG) 0.1 %, Apply 1 application. topically 2 (two) times daily. X7-10 days for rash, Disp: 80 g, Rfl: 0  Cognitive Assessment Identity Confirmed: : Name; DOB Cognitive Status: Normal   Functional Assessment Hearing Difficulty or Deaf: yes Hearing Management: bil hearing aides Wear Glasses or Blind: yes Vision Management: can see well w/ glasses Concentrating, Remembering or Making Decisions Difficulty (CP): no Difficulty Communicating: no Difficulty Eating/Swallowing: no  Walking or Climbing Stairs Difficulty: no Dressing/Bathing Difficulty: no Doing Errands Independently Difficulty (such as shopping) (CP): no   Caregiver Assessment  Primary Source of Support/Comfort: spouse Name of Support/Comfort Primary Source: Melissa Schneider People in Home: spouse   Planned Interventions  Provided education to patient about basic DM disease process; Reviewed medications with patient and discussed importance of medication adherence;         Reviewed prescribed diet with patient carbohydrate modified; Counseled on importance of regular laboratory monitoring as prescribed;        Discussed plans with patient for ongoing care management follow up and provided patient with direct contact information for care management team;      Provided patient with written educational materials related to hypo and hyperglycemia and importance of correct treatment;       Advised patient, providing education and rationale, to check cbg per CGM  and record        call provider for findings outside established parameters;       Review of patient status, including review of consultants reports, relevant laboratory and other test results, and medications completed;       Screening for signs and symptoms of depression related to chronic disease state;        Assessed social determinant of health barriers;        Evaluation of current treatment plan related to hypertension self management and patient's adherence to plan as established by provider;   Reviewed prescribed diet low sodium Reviewed medications with patient and discussed importance of compliance;  Counseled on the importance of exercise goals with target of 150 minutes per week Discussed plans with patient for ongoing care management follow up and provided patient with direct contact information for care management team; Advised patient, providing education and rationale, to monitor blood pressure daily and record, calling PCP for findings outside established parameters;  Provided education on prescribed diet low sodium;  Discussed complications of poorly controlled blood pressure such as heart disease, stroke, circulatory complications, vision complications, kidney impairment, sexual dysfunction;  Screening for signs and symptoms of depression related to chronic disease state;  Assessed social determinant of health barriers;   Interaction and coordination with outside resources, practitioners,  and providers See CCM Referral  Care Plan: Available in MyChart

## 2022-10-08 NOTE — Patient Instructions (Signed)
Please call the care guide team at [phone] if you need to cancel or reschedule your appointment.   If you are experiencing a Mental Health or [place] or need someone to talk to, please call the Suicide and Crisis Lifeline: 988 call the [place] National Suicide Prevention Lifeline: 313-886-9257 or TTY: 570-738-8204 TTY (437)640-0466) to talk to a trained counselor call 1-800-273-TALK (toll free, 24 hour hotline) go to [facility] [address], [place] [phone]) call the [facility]: [phone] call 911   Following is a copy of the CCM Program Consent:  CCM service includes personalized support from designated clinical staff supervised by the physician, including individualized plan of care and coordination with other care providers 24/7 contact phone numbers for assistance for urgent and routine care needs. Service will only be billed when office clinical staff spend 20 minutes or more in a month to coordinate care. Only one practitioner may furnish and bill the service in a calendar month. The patient may stop CCM services at amy time (effective at the end of the month) by phone call to the office staff. The patient will be responsible for cost sharing (co-pay) or up to 20% of the service fee (after annual deductible is met)  Following is a copy of your full provider care plan:   Goals Addressed             This Visit's Progress    CCM (DIABETES) EXPECTED OUTCOME: MONITOR, SELF-MANAGE AND REDUCE SYMPTOMS OF DIABETES       Current Barriers:  Knowledge Deficits related to Diabetes management Chronic Disease Management support and education needs related to Diabetes, ciet No Advanced Directives in place- pt has documents on hand Patient reports CBG is monitored with Freestyle Libre and she checks readings randomly against her glucometer to make sure reading correctly. Patient reports FBS ranges 90-130  with 3 low readings over the past 7 days 60-80, monitor alarms to alert pt. Patient verbalizes understanding of correct treatment for hypoglycemiaand stays in touch with primary care provider about blood sugar readings, plan of care, and medication adjustments. Patient reports RBS ranges usually in 100's PharmD to outreach pt on 11/02/22  Planned Interventions: Provided education to patient about basic DM disease process; Reviewed medications with patient and discussed importance of medication adherence;        Reviewed prescribed diet with patient carbohydrate modified; Counseled on importance of regular laboratory monitoring as prescribed;        Discussed plans with patient for ongoing care management follow up and provided patient with direct contact information for care management team;      Provided patient with written educational materials related to hypo and hyperglycemia and importance of correct treatment;       Advised patient, providing education and rationale, to check cbg per CGM  and record        call provider for findings outside established parameters;       Review of patient status, including review of consultants reports, relevant laboratory and other test results, and medications completed;       Screening for signs and symptoms of depression related to chronic disease state;        Assessed social determinant of health barriers;         Symptom Management: Take medications as prescribed   Attend all scheduled provider appointments Call pharmacy for medication refills 3-7 days in advance of running out of medications Attend church or other  social activities Perform all self care activities independently  Perform IADL's (shopping, preparing meals, housekeeping, managing finances) independently Call provider office for new concerns or questions  check blood sugar at prescribed times: per [facility]  check feet daily for cuts, sores or redness enter blood sugar  readings and medication or insulin into daily log take the blood sugar log to all doctor visits take the blood sugar meter to all doctor visits trim toenails straight across fill half of plate with vegetables limit fast food meals to no more than 1 per week manage portion size read food labels for fat, fiber, carbohydrates and portion size set a realistic goal Look over education in my chart- hypoglycemia Follow RULE OF 15 for low blood sugar management:  How to treat low blood sugars (Blood sugar less than 70 mg/dl  Please follow the RULE OF 15 for the treatment of hypoglycemia treatment (When your blood sugars are less than 70 mg/ dl) STEP  1:  Take 15 grams of carbohydrates when your blood sugar is low, which includes:   3-4 glucose tabs or  3-4 oz of juice or regular soda or  One tube of glucose gel STEP 2:  Recheck blood sugar in 15 minutes STEP 3:  If your blood sugar is still low at the 15 minute recheck ---then, go back to STEP 1 and treat again with another 15 grams of carbohydrates  Follow Up Plan: Telephone follow up appointment with care management team member scheduled for:  12/13/22 at 130 pm       CCM (HYPERTENSION) EXPECTED OUTCOME: MONITOR, SELF-MANAGE AND REDUCE SYMPTOMS OF HYPERTENSION       Current Barriers:  Knowledge Deficits related to Hypertension management Care Coordination needs related to pharmacy, medication in a patient with Hypertension Chronic Disease Management support and education needs related to Hypertension No Advanced Directives in place- pt reports she has documents on hand to complete Patient reports she lives with spouse, is independent with all aspects of her care, drives, volunteers [address], walks daily for 1.5-3 miles Patient reports she checks blood pressure once per week and states "readings are good"  Planned Interventions: Evaluation of current treatment plan related to hypertension self management and patient's adherence to plan as  established by provider;   Reviewed prescribed diet low sodium Reviewed medications with patient and discussed importance of compliance;  Counseled on the importance of exercise goals with target of 150 minutes per week Discussed plans with patient for ongoing care management follow up and provided patient with direct contact information for care management team; Advised patient, providing education and rationale, to monitor blood pressure daily and record, calling PCP for findings outside established parameters;  Provided education on prescribed diet low sodium;  Discussed complications of poorly controlled blood pressure such as heart disease, stroke, circulatory complications, vision complications, kidney impairment, sexual dysfunction;  Screening for signs and symptoms of depression related to chronic disease state;  Assessed social determinant of health barriers;   Symptom Management: Take medications as prescribed   Attend all scheduled provider appointments Call pharmacy for medication refills 3-7 days in advance of running out of medications Attend church or other social activities Perform all self care activities independently  Perform IADL's (shopping, preparing meals, housekeeping, managing finances) independently Call provider office for new concerns or questions  check blood pressure weekly choose a place to take my blood pressure (home, clinic or office, retail store) write blood pressure results in a log or diary keep  a blood pressure log take blood pressure log to all doctor appointments keep all doctor appointments take medications for blood pressure exactly as prescribed report new symptoms to your doctor eat more whole grains, fruits and vegetables, lean meats and healthy fats Look over education in my chart- low sodium diet  Follow Up Plan: Telephone follow up appointment with care management team member scheduled for:  12/13/22 at 130 pm          Patient  verbalizes understanding of instructions and care plan provided today and agrees to view in [place]. Active MyChart status and patient understanding of how to access instructions and care plan via MyChart confirmed with patient.     Telephone follow up appointment with care management team member scheduled for:  12/13/22 at 130 pm  Low-Sodium Eating Plan Sodium, which is an element that makes up salt, helps you maintain a healthy balance of fluids in your body. Too much sodium can increase your blood pressure and cause fluid and waste to be held in your body. Your health care provider or dietitian may recommend following this plan if you have high blood pressure (hypertension), kidney disease, liver disease, or heart failure. Eating less sodium can help lower your blood pressure, reduce swelling, and protect your heart, liver, and kidneys. What are tips for following this plan? Reading food labels The Nutrition Facts label lists the amount of sodium in one serving of the food. If you eat more than one serving, you must multiply the listed amount of sodium by the number of servings. Choose foods with less than 140 mg of sodium per serving. Avoid foods with 300 mg of sodium or more per serving. Shopping  Look for lower-sodium products, often labeled as "low-sodium" or "no salt added." Always check the sodium content, even if foods are labeled as "unsalted" or "no salt added." Buy fresh foods. Avoid canned foods and pre-made or frozen meals. Avoid canned, cured, or processed meats. Buy breads that have less than 80 mg of sodium per slice. Cooking  Eat more home-cooked food and less restaurant, buffet, and fast food. Avoid adding salt when cooking. Use salt-free seasonings or herbs instead of table salt or sea salt. Check with your health care provider or pharmacist before using salt substitutes. Cook with plant-based oils, such as canola, sunflower, or olive oil. Meal planning When eating at a  restaurant, ask that your food be prepared with less salt or no salt, if possible. Avoid dishes labeled as brined, pickled, cured, smoked, or made with soy sauce, miso, or teriyaki sauce. Avoid foods that contain MSG (monosodium glutamate). MSG is sometimes added to [place] food, bouillon, and some canned foods. Make meals that can be grilled, baked, poached, roasted, or steamed. These are generally made with less sodium. General information Most people on this plan should limit their sodium intake to 1,500-2,000 mg (milligrams) of sodium each day. What foods should I eat? Fruits Fresh, frozen, or canned fruit. Fruit juice. Vegetables Fresh or frozen vegetables. "No salt added" canned vegetables. "No salt added" tomato sauce and paste. Low-sodium or reduced-sodium tomato and vegetable juice. Grains Low-sodium cereals, including oats, puffed wheat and rice, and shredded wheat. Low-sodium crackers. Unsalted rice. Unsalted pasta. Low-sodium bread. Whole-grain breads and whole-grain pasta. Meats and other proteins Fresh or frozen (no salt added) meat, poultry, seafood, and fish. Low-sodium canned tuna and salmon. Unsalted nuts. Dried peas, beans, and lentils without added salt. Unsalted canned beans. Eggs. Unsalted nut butters. Dairy Milk. Soy milk.  Cheese that is naturally low in sodium, such as ricotta cheese, fresh mozzarella, or Swiss cheese. Low-sodium or reduced-sodium cheese. Cream cheese. Yogurt. Seasonings and condiments Fresh and dried herbs and spices. Salt-free seasonings. Low-sodium mustard and ketchup. Sodium-free salad dressing. Sodium-free light mayonnaise. Fresh or refrigerated horseradish. Lemon juice. Vinegar. Other foods Homemade, reduced-sodium, or low-sodium soups. Unsalted popcorn and pretzels. Low-salt or salt-free chips. The items listed above may not be a complete list of foods and beverages you can eat. Contact a dietitian for more information. What foods should I  avoid? Vegetables Sauerkraut, pickled vegetables, and relishes. Olives. [place] fries. Onion rings. Regular canned vegetables (not low-sodium or reduced-sodium). Regular canned tomato sauce and paste (not low-sodium or reduced-sodium). Regular tomato and vegetable juice (not low-sodium or reduced-sodium). Frozen vegetables in sauces. Grains Instant hot cereals. Bread stuffing, pancake, and biscuit mixes. Croutons. Seasoned rice or pasta mixes. Noodle soup cups. Boxed or frozen macaroni and cheese. Regular salted crackers. Self-rising flour. Meats and other proteins Meat or fish that is salted, canned, smoked, spiced, or pickled. Precooked or cured meat, such as sausages or meat loaves. Berniece Salines. Ham. Pepperoni. Hot dogs. Corned beef. Chipped beef. Salt pork. Jerky. Pickled herring. Anchovies and sardines. Regular canned tuna. Salted nuts. Dairy Processed cheese and cheese spreads. Hard cheeses. Cheese curds. Blue cheese. Feta cheese. String cheese. Regular cottage cheese. Buttermilk. Canned milk. Fats and oils Salted butter. Regular margarine. Ghee. Bacon fat. Seasonings and condiments Onion salt, garlic salt, seasoned salt, table salt, and sea salt. Canned and packaged gravies. Worcestershire sauce. Tartar sauce. Barbecue sauce. Teriyaki sauce. Soy sauce, including reduced-sodium. Steak sauce. Fish sauce. Oyster sauce. Cocktail sauce. Horseradish that you find on the shelf. Regular ketchup and mustard. Meat flavorings and tenderizers. Bouillon cubes. Hot sauce. Pre-made or packaged marinades. Pre-made or packaged taco seasonings. Relishes. Regular salad dressings. Salsa. Other foods Salted popcorn and pretzels. Corn chips and puffs. Potato and tortilla chips. Canned or dried soups. Pizza. Frozen entrees and pot pies. The items listed above may not be a complete list of foods and beverages you should avoid. Contact a dietitian for more information. Summary Eating less sodium can help lower your blood  pressure, reduce swelling, and protect your heart, liver, and kidneys. Most people on this plan should limit their sodium intake to 1,500-2,000 mg (milligrams) of sodium each day. Canned, boxed, and frozen foods are high in sodium. Restaurant foods, fast foods, and pizza are also very high in sodium. You also get sodium by adding salt to food. Try to cook at home, eat more fresh fruits and vegetables, and eat less fast food and canned, processed, or prepared foods. This information is not intended to replace advice given to you by your health care provider. Make sure you discuss any questions you have with your health care provider. Document Revised: 06/08/2019 Document Reviewed: 06/03/2019 Elsevier Patient Education  [place]. Hypoglycemia Hypoglycemia is when the sugar (glucose) level in your blood is too low. Low blood sugar can happen to people who have diabetes and people who do not have diabetes. Low blood sugar can happen quickly, and it can be an emergency. What are the causes? This condition happens most often in people who have diabetes. It may be caused by: Diabetes medicine. Not eating enough, or not eating often enough. Doing more physical activity. Drinking alcohol on an empty stomach. If you do not have diabetes, this condition may be caused by: A tumor in the pancreas. Not eating enough, or not eating  for long periods at a time (fasting). A very bad infection or illness. Problems after having weight loss (bariatric) surgery. Kidney failure or liver failure. Certain medicines. What increases the risk? This condition is more likely to develop in people who: Have diabetes and take medicines to lower their blood sugar. Abuse alcohol. Have a very bad illness. What are the signs or symptoms? Mild Hunger. Sweating and feeling clammy. Feeling dizzy or light-headed. Being sleepy or having trouble sleeping. Feeling like you may vomit (nauseous). A fast heartbeat. A  headache. Blurry vision. Mood changes, such as: Being grouchy. Feeling worried or nervous (anxious). Tingling or loss of feeling (numbness) around your mouth, lips, or tongue. Moderate Confusion and poor judgment. Behavior changes. Weakness. Uneven heartbeat. Trouble with moving (coordination). Very low Very low blood sugar (severe hypoglycemia) is a medical emergency. It can cause: Fainting. Seizures. Loss of consciousness (coma). Death. How is this treated? Treating low blood sugar Low blood sugar is often treated by eating or drinking something that has sugar in it right away. The food or drink should contain 15 grams of a fast-acting carb (carbohydrate). Options include: 4 oz (120 mL) of fruit juice. 4 oz (120 mL) of regular soda (not diet soda). A few pieces of hard candy. Check food labels to see how many pieces to eat for 15 grams. 1 Tbsp (15 mL) of sugar or honey. 4 glucose tablets. 1 tube of glucose gel. Treating low blood sugar if you have diabetes If you can think clearly and swallow safely, follow the 15:15 rule: Take 15 grams of a fast-acting carb. Talk with your doctor about how much you should take. Always keep a source of fast-acting carb with you, such as: Glucose tablets (take 4 tablets). A few pieces of hard candy. Check food labels to see how many pieces to eat for 15 grams. 4 oz (120 mL) of fruit juice. 4 oz (120 mL) of regular soda (not diet soda). 1 Tbsp (15 mL) of honey or sugar. 1 tube of glucose gel. Check your blood sugar 15 minutes after you take the carb. If your blood sugar is still at or below 70 mg/dL (3.9 mmol/L), take 15 grams of a carb again. If your blood sugar does not go above 70 mg/dL (3.9 mmol/L) after 3 tries, get help right away. After your blood sugar goes back to normal, eat a meal or a snack within 1 hour.  Treating very low blood sugar If your blood sugar is below 54 mg/dL (3 mmol/L), you have very low blood sugar, or severe  hypoglycemia. This is an emergency. Get medical help right away. If you have very low blood sugar and you cannot eat or drink, you will need to be given a hormone called glucagon. A family member or friend should learn how to check your blood sugar and how to give you glucagon. Ask your doctor if you need to have an emergency glucagon kit at home. Very low blood sugar may also need to be treated in a hospital. Follow these instructions at home: General instructions Take over-the-counter and prescription medicines only as told by your doctor. Stay aware of your blood sugar as told by your doctor. If you drink alcohol: Limit how much you have to: 0-1 drink a day for women who are not pregnant. 0-2 drinks a day for men. Know how much alcohol is in your drink. In the [place] one drink equals one 12 oz bottle of beer (355 mL), one 5 oz glass  of wine (148 mL), or one 1 oz glass of hard liquor (44 mL). Be sure to eat food when you drink alcohol. Know that your body absorbs alcohol quickly. This may lead to low blood sugar later. Be sure to keep checking your blood sugar. Keep all follow-up visits. If you have diabetes:  Always have a fast-acting carb (15 grams) with you to treat low blood sugar. Follow your diabetes care plan as told by your doctor. Make sure you: Know the symptoms of low blood sugar. Check your blood sugar as often as told. Always check it before and after exercise. Always check your blood sugar before you drive. Take your medicines as told. Follow your meal plan. Eat on time. Do not skip meals. Share your diabetes care plan with: Your work or school. People you live with. Carry a card or wear jewelry that says you have diabetes. Where to find more information American Diabetes Association: www.diabetes.org Contact a doctor if: You have trouble keeping your blood sugar in your target range. You have low blood sugar often. Get help right away if: You still have symptoms  after you eat or drink something that contains 15 grams of fast-acting carb, and you cannot get your blood sugar above 70 mg/dL by following the 15:15 rule. Your blood sugar is below 54 mg/dL (3 mmol/L). You have a seizure. You faint. These symptoms may be an emergency. Get help right away. Call your local emergency services (911 in the [place] Do not wait to see if the symptoms will go away. Do not drive yourself to the hospital. Summary Hypoglycemia happens when the level of sugar (glucose) in your blood is too low. Low blood sugar can happen to people who have diabetes and people who do not have diabetes. Low blood sugar can happen quickly, and it can be an emergency. Make sure you know the symptoms of low blood sugar and know how to treat it. Always keep a source of sugar (fast-acting carb) with you to treat low blood sugar. This information is not intended to replace advice given to you by your health care provider. Make sure you discuss any questions you have with your health care provider. Document Revised: 06/02/2020 Document Reviewed: 06/02/2020 Elsevier Patient Education  [place].

## 2022-10-08 NOTE — Chronic Care Management (AMB) (Signed)
Chronic Care Management   CCM RN Visit Note  10/08/2022 Name: Melissa Schneider MRN: [id number] DOB: 09/02/1955  Subjective: Melissa Schneider is a 67 y.o. year old female who is a primary care patient of Melissa Norlander, DO. The patient was referred to the Chronic Care Management team for assistance with care management needs subsequent to provider initiation of CCM services and plan of care.    Today's Visit:  Engaged with patient by telephone for initial visit.     SDOH Interventions Today    Flowsheet Row Most Recent Value  SDOH Interventions   Food Insecurity Interventions Intervention Not Indicated  Housing Interventions Intervention Not Indicated  Transportation Interventions Intervention Not Indicated  Utilities Interventions Intervention Not Indicated  Financial Strain Interventions Intervention Not Indicated  Physical Activity Interventions Intervention Not Indicated  Stress Interventions Intervention Not Indicated  Social Connections Interventions Intervention Not Indicated         Goals Addressed             This Visit's Progress    CCM (DIABETES) EXPECTED OUTCOME: MONITOR, SELF-MANAGE AND REDUCE SYMPTOMS OF DIABETES       Current Barriers:  Knowledge Deficits related to Diabetes management Chronic Disease Management support and education needs related to Diabetes, ciet No Advanced Directives in place- pt has documents on hand Patient reports CBG is monitored with Freestyle Libre and she checks readings randomly against her glucometer to make sure reading correctly. Patient reports FBS ranges 90-130 with 3 low readings over the past 7 days 60-80, monitor alarms to alert pt. Patient verbalizes understanding of correct treatment for hypoglycemiaand stays in touch with primary care provider about blood sugar readings, plan of care, and medication adjustments. Patient reports RBS ranges usually in 100's PharmD to outreach pt on 11/02/22  Planned  Interventions: Provided education to patient about basic DM disease process; Reviewed medications with patient and discussed importance of medication adherence;        Reviewed prescribed diet with patient carbohydrate modified; Counseled on importance of regular laboratory monitoring as prescribed;        Discussed plans with patient for ongoing care management follow up and provided patient with direct contact information for care management team;      Provided patient with written educational materials related to hypo and hyperglycemia and importance of correct treatment;       Advised patient, providing education and rationale, to check cbg per CGM  and record        call provider for findings outside established parameters;       Review of patient status, including review of consultants reports, relevant laboratory and other test results, and medications completed;       Screening for signs and symptoms of depression related to chronic disease state;        Assessed social determinant of health barriers;         Symptom Management: Take medications as prescribed   Attend all scheduled provider appointments Call pharmacy for medication refills 3-7 days in advance of running out of medications Attend church or other social activities Perform all self care activities independently  Perform IADL's (shopping, preparing meals, housekeeping, managing finances) independently Call provider office for new concerns or questions  check blood sugar at prescribed times: per [facility]  check feet daily for cuts, sores or redness enter blood sugar readings and medication or insulin into daily log take the blood sugar log to all doctor visits take the blood sugar  meter to all doctor visits trim toenails straight across fill half of plate with vegetables limit fast food meals to no more than 1 per week manage portion size read food labels for fat, fiber, carbohydrates and portion size set a  realistic goal Look over education in my chart- hypoglycemia Follow RULE OF 15 for low blood sugar management:  How to treat low blood sugars (Blood sugar less than 70 mg/dl  Please follow the RULE OF 15 for the treatment of hypoglycemia treatment (When your blood sugars are less than 70 mg/ dl) STEP  1:  Take 15 grams of carbohydrates when your blood sugar is low, which includes:   3-4 glucose tabs or  3-4 oz of juice or regular soda or  One tube of glucose gel STEP 2:  Recheck blood sugar in 15 minutes STEP 3:  If your blood sugar is still low at the 15 minute recheck ---then, go back to STEP 1 and treat again with another 15 grams of carbohydrates  Follow Up Plan: Telephone follow up appointment with care management team member scheduled for:  12/13/22 at 130 pm       CCM (HYPERTENSION) EXPECTED OUTCOME: MONITOR, SELF-MANAGE AND REDUCE SYMPTOMS OF HYPERTENSION       Current Barriers:  Knowledge Deficits related to Hypertension management Care Coordination needs related to pharmacy, medication in a patient with Hypertension Chronic Disease Management support and education needs related to Hypertension No Advanced Directives in place- pt reports she has documents on hand to complete Patient reports she lives with spouse, is independent with all aspects of her care, drives, volunteers [address], walks daily for 1.5-3 miles Patient reports she checks blood pressure once per week and states "readings are good"  Planned Interventions: Evaluation of current treatment plan related to hypertension self management and patient's adherence to plan as established by provider;   Reviewed prescribed diet low sodium Reviewed medications with patient and discussed importance of compliance;  Counseled on the importance of exercise goals with target of 150 minutes per week Discussed plans with patient for ongoing care management follow up and provided patient with direct contact information for care  management team; Advised patient, providing education and rationale, to monitor blood pressure daily and record, calling PCP for findings outside established parameters;  Provided education on prescribed diet low sodium;  Discussed complications of poorly controlled blood pressure such as heart disease, stroke, circulatory complications, vision complications, kidney impairment, sexual dysfunction;  Screening for signs and symptoms of depression related to chronic disease state;  Assessed social determinant of health barriers;   Symptom Management: Take medications as prescribed   Attend all scheduled provider appointments Call pharmacy for medication refills 3-7 days in advance of running out of medications Attend church or other social activities Perform all self care activities independently  Perform IADL's (shopping, preparing meals, housekeeping, managing finances) independently Call provider office for new concerns or questions  check blood pressure weekly choose a place to take my blood pressure (home, clinic or office, retail store) write blood pressure results in a log or diary keep a blood pressure log take blood pressure log to all doctor appointments keep all doctor appointments take medications for blood pressure exactly as prescribed report new symptoms to your doctor eat more whole grains, fruits and vegetables, lean meats and healthy fats Look over education in my chart- low sodium diet  Follow Up Plan: Telephone follow up appointment with care management team member scheduled for:  12/13/22 at  130 pm          Plan:Telephone follow up appointment with care management team member scheduled for:  12/13/22 at 130 pm  Jacqlyn Larsen [place], BSN RN Case Manager [place] [phone]

## 2022-10-14 DIAGNOSIS — I10 Essential (primary) hypertension: Secondary | ICD-10-CM | POA: Diagnosis not present

## 2022-10-14 DIAGNOSIS — E1159 Type 2 diabetes mellitus with other circulatory complications: Secondary | ICD-10-CM | POA: Diagnosis not present

## 2022-10-14 DIAGNOSIS — Z794 Long term (current) use of insulin: Secondary | ICD-10-CM

## 2022-10-17 ENCOUNTER — Encounter: Payer: Self-pay | Admitting: Family Medicine

## 2022-10-30 ENCOUNTER — Telehealth: Payer: Self-pay | Admitting: Pharmacist

## 2022-10-30 NOTE — Telephone Encounter (Signed)
Please send me PAP for Ozempic 0.5mg   Thanks!

## 2022-11-02 ENCOUNTER — Ambulatory Visit: Payer: PPO

## 2022-11-06 NOTE — Telephone Encounter (Signed)
Submitted application for OZEMPIC to [employer] for patient assistance.   Phone: [phone]  

## 2022-11-12 NOTE — Telephone Encounter (Signed)
Received notification from [employer] regarding approval for OZEMPIC. Patient assistance approved from 11/08/22 to 11/02/23.  Phone: [phone]

## 2022-11-20 ENCOUNTER — Ambulatory Visit (INDEPENDENT_AMBULATORY_CARE_PROVIDER_SITE_OTHER): Payer: PPO | Admitting: Pharmacist

## 2022-11-20 DIAGNOSIS — I152 Hypertension secondary to endocrine disorders: Secondary | ICD-10-CM

## 2022-11-20 DIAGNOSIS — E1159 Type 2 diabetes mellitus with other circulatory complications: Secondary | ICD-10-CM

## 2022-11-20 NOTE — Progress Notes (Signed)
11/20/2022 Name: Melissa Schneider MRN: [id number] DOB: [birth year]  Melissa Schneider is a 67 y.o. year old female who presented for a telephone visit.   They were referred to the pharmacist by their PCP for assistance in managing diabetes.   Subjective:  Care Team: Primary Care Provider: Raliegh Ip, DO ; Next Scheduled Visit: 01/07/2023  Medication Access/Adherence  Current Pharmacy:  [facility] - [place], [place] - [address] [place] [address] Phone: [phone] Fax: [phone]  [facility], [place] (New Address) - [place], [place] - [address] AT Previously: [place], [place] [address] 4th [address] [place] [address] Phone: [phone] Fax: [phone]   Patient reports affordability concerns with their medications: Yes - approved for Ozempic PAP via [employer] Patient reports access/transportation concerns to their pharmacy: No  Patient reports adherence concerns with their medications:  No     Diabetes:  Current medications: Lantus 10 units QPM, Novolog 4-6 units TID, Ozempic 0.5 mg weekly   Current glucose readings: reports all readings <130, a few nocturnal lows in the 70s.   Patient denies dizziness, shakiness, sweating, polyuria, polydipsia, polyphagia, nocturia, neuropathy, blurred vision.  Current meal patterns:  -Eating less since starting Ozempic  Current physical activity: lost ~5 lbs since April 2024  Current medication access support: Ozempic PAP   Objective:  Lab Results  Component Value Date   HGBA1C 7.1 (H) 10/03/2022    Lab Results  Component Value Date   CREATININE 1.21 (H) 10/03/2022   BUN 16 10/03/2022   NA 139 10/03/2022   K 4.1 10/03/2022   CL 103 10/03/2022   CO2 21 10/03/2022    Lab Results  Component Value Date   CHOL 260 (H) 05/22/2022   HDL 90 05/22/2022   LDLCALC 157 (H) 05/22/2022   LDLDIRECT 130 (H)  10/03/2022   TRIG 80 05/22/2022   CHOLHDL 2.9 05/22/2022    Medications Reviewed Today     Reviewed by Rushie Goltz, RPH (Pharmacist) on 11/20/22 at 1049  Med List Status: <None>   Medication Order Taking? Sig Documenting Provider Last Dose Status Informant  aspirin 81 MG tablet [id number]  Take 81 mg by mouth daily. [provider]  Active   Continuous Blood Gluc Sensor (FREESTYLE LIBRE 3 SENSOR) [place] [id number] Yes Place 1 sensor on the skin every 14 days. Use to check glucose continuously E11.69 Delynn Flavin M, DO Taking Active   docusate sodium (COLACE) 100 MG capsule [id number]  Take 200 mg by mouth daily as needed.  [provider]  Active   glucose blood (GNP EASY TOUCH GLUCOSE TEST) test strip [id number]  EVERY DAY Delynn Flavin M, DO  Active   insulin aspart (NOVOLOG FLEXPEN) 100 UNIT/ML FlexPen [id number]  Inject 10-17 Units into the skin 3 (three) times daily with meals. Delynn Flavin M, DO  Active            Med Note Vance Gather, Clarece Drzewiecki S   Tue Nov 20, 2022 10:49 AM) 4-6 units TID  insulin glargine (LANTUS) 100 UNIT/ML injection [id number] Yes INJECT 0.22-0.3MLS (22-30 UNITS) INTO THE SKIN AT BEDTIME  Patient taking differently: Inject 10 Units into the skin at bedtime.   Raliegh Ip, DO Taking Active   Insulin Pen Needle (PEN NEEDLES) 32G X 4 MM MISC [id number]  UAD E11.69 Delynn Flavin M, DO  Active   Insulin Syringe-Needle U-100 (INSULIN SYRINGE .5CC/31GX5/16")  31G X 5/16" 0.5 ML MISC [id number]  UAD with Lantus E11.69 Delynn Flavin M, DO  Active   levocetirizine (XYZAL) 5 MG tablet [id number]  Take 1 tablet (5 mg total) by mouth at bedtime as needed for allergies (itching/ rash). Raliegh Ip, DO  Active   levothyroxine (SYNTHROID) 137 MCG tablet [id number]  TAKE ONE TABLET [facility] BEFORE BREAKFAST Delynn Flavin M, [place]  Active   losartan (COZAAR) 25 MG tablet [id number]  TAKE ONE (1) TABLET EACH DAY [place], [place] M, [place]   Active   Multiple Vitamin (MULTIVITAMIN WITH MINERALS) TABS tablet [id number]  Take 1 tablet by mouth daily. [provider]  Active Self  rosuvastatin (CRESTOR) 20 MG tablet [id number]  Take 1 tablet (20 mg total) by mouth daily.  Patient taking differently: Take 20 mg by mouth daily. Takes 2-3 x per week   Gottschalk, Ashly M, DO  Active   Semaglutide,0.25 or 0.5MG /DOS, (OZEMPIC, 0.25 OR 0.5 MG/DOSE,) 2 MG/3ML SOPN [id number]  Inject 0.5 mg into the skin every 7 (seven) days. Delynn Flavin M, DO  Active   SUMAtriptan (IMITREX) 100 MG tablet [id number]  May repeat in 2 hours if headache persists or recurs. Delynn Flavin M, DO  Active   triamcinolone cream (KENALOG) 0.1 % [id number]  Apply 1 application. topically 2 (two) times daily. X7-10 days for rash Delynn Flavin M, DO  Active               Assessment/Plan:   Diabetes: - Currently controlled per patient report. Ozempic PAP has been approved. Instructed patient the clinic will call her once it is ready for pick up.  - Reviewed long term cardiovascular and renal outcomes of uncontrolled blood sugar - Reviewed goal A1c, goal fasting, and goal 2 hour post prandial glucose - Recommend to continue current treatment. May consider reducing insulin dose at follow up if Ozempic is titrated to 1 mg.  Denies personal and family history of Medullary thyroid cancer (MTC)    Follow Up Plan:  Pharmacist: 1 month PCP: June 2024  Valeda Malm, Ilda Basset.D. PGY-2 Ambulatory Care Pharmacy Resident  Kieth Brightly, PharmD, BCACP Clinical Pharmacist, [facility]

## 2022-11-30 NOTE — Telephone Encounter (Signed)
Patient aware, samples ready for pickup

## 2022-11-30 NOTE — Telephone Encounter (Signed)
Patient has not received medication and wants to know if we have any samples. She requested to be called ASAP and to let her know even if we do not have any.

## 2022-12-13 ENCOUNTER — Telehealth: Payer: PPO

## 2022-12-13 ENCOUNTER — Ambulatory Visit (INDEPENDENT_AMBULATORY_CARE_PROVIDER_SITE_OTHER): Payer: PPO | Admitting: *Deleted

## 2022-12-13 DIAGNOSIS — E1169 Type 2 diabetes mellitus with other specified complication: Secondary | ICD-10-CM

## 2022-12-13 DIAGNOSIS — E1159 Type 2 diabetes mellitus with other circulatory complications: Secondary | ICD-10-CM

## 2022-12-13 NOTE — Patient Instructions (Signed)
Please call the care guide team at [phone] if you need to cancel or reschedule your appointment.   If you are experiencing a Mental Health or Behavioral Health Crisis or need someone to talk to, please call the Suicide and Crisis Lifeline: 988 call the [place] National Suicide Prevention Lifeline: 782-784-0354 or TTY: 249-034-1694 TTY 937-194-0644) to talk to a trained counselor call 1-800-273-TALK (toll free, 24 hour hotline) go to [facility] [address], [place] [phone]) call the [facility]: [phone] call 911   Following is a copy of the CCM Program Consent:  CCM service includes personalized support from designated clinical staff supervised by the physician, including individualized plan of care and coordination with other care providers 24/7 contact phone numbers for assistance for urgent and routine care needs. Service will only be billed when office clinical staff spend 20 minutes or more in a month to coordinate care. Only one practitioner may furnish and bill the service in a calendar month. The patient may stop CCM services at amy time (effective at the end of the month) by phone call to the office staff. The patient will be responsible for cost sharing (co-pay) or up to 20% of the service fee (after annual deductible is met)  Following is a copy of your full provider care plan:   Goals Addressed             This Visit's Progress    CCM (DIABETES) EXPECTED OUTCOME: MONITOR, SELF-MANAGE AND REDUCE SYMPTOMS OF DIABETES       Current Barriers:  Knowledge Deficits related to Diabetes management Chronic Disease Management support and education needs related to Diabetes, ciet No Advanced Directives in place- pt has documents on hand Patient reports CBG is monitored with Freestyle Libre and she checks readings randomly against her glucometer to make sure reading correctly. Patient reports FBS ranges in 200,  random ranges 124-129, Patient verbalizes understanding of correct treatment for hypoglycemia and stays in touch with primary care provider about blood sugar readings, plan of care, and medication adjustments PharmD has been working with pt  Planned Interventions: Provided education to patient about basic DM disease process; Reviewed medications with patient and discussed importance of medication adherence;        Counseled on importance of regular laboratory monitoring as prescribed;        Discussed plans with patient for ongoing care management follow up and provided patient with direct contact information for care management team;      Advised patient, providing education and rationale, to check cbg per CGM  and record        call provider for findings outside established parameters;       Review of patient status, including review of consultants reports, relevant laboratory and other test results, and medications completed;       Reinforced carbohydrate modified diet and food choices  Symptom Management: Take medications as prescribed   Attend all scheduled provider appointments Call pharmacy for medication refills 3-7 days in advance of running out of medications Attend church or other social activities Perform all self care activities independently  Perform IADL's (shopping, preparing meals, housekeeping, managing finances) independently Call provider office for new concerns or questions  check blood sugar at prescribed times: per [facility]  check feet daily for cuts, sores or redness enter blood sugar readings and medication or insulin into daily log take the blood sugar log to all doctor visits take the blood sugar meter to  all doctor visits trim toenails straight across fill half of plate with vegetables limit fast food meals to no more than 1 per week manage portion size prepare main meal at home 3 to 5 days each week read food labels for fat, fiber, carbohydrates and  portion size set a realistic goal Continue walking daily Follow RULE OF 15 for low blood sugar management:  How to treat low blood sugars (Blood sugar less than 70 mg/dl  Please follow the RULE OF 15 for the treatment of hypoglycemia treatment (When your blood sugars are less than 70 mg/ dl) STEP  1:  Take 15 grams of carbohydrates when your blood sugar is low, which includes:   3-4 glucose tabs or  3-4 oz of juice or regular soda or  One tube of glucose gel STEP 2:  Recheck blood sugar in 15 minutes STEP 3:  If your blood sugar is still low at the 15 minute recheck ---then, go back to STEP 1 and treat again with another 15 grams of carbohydrates  Follow Up Plan: Telephone follow up appointment with care management team member scheduled for:   02/27/23 at 215 pm       CCM (HYPERTENSION) EXPECTED OUTCOME: MONITOR, SELF-MANAGE AND REDUCE SYMPTOMS OF HYPERTENSION       Current Barriers:  Knowledge Deficits related to Hypertension management Care Coordination needs related to pharmacy, medication in a patient with Hypertension Chronic Disease Management support and education needs related to Hypertension No Advanced Directives in place- pt reports she has documents on hand to complete Patient reports she lives with spouse, is independent with all aspects of her care, drives, volunteers [address], walks daily for 1.5-3 miles Patient reports she checks blood pressure once per week and states "readings are good" with recent reading 125/73.  Planned Interventions: Evaluation of current treatment plan related to hypertension self management and patient's adherence to plan as established by provider;   Reviewed medications with patient and discussed importance of compliance;  Counseled on the importance of exercise goals with target of 150 minutes per week Advised patient, providing education and rationale, to monitor blood pressure daily and record, calling PCP for findings outside established  parameters;  Reinforced low sodium diet  Symptom Management: Take medications as prescribed   Attend all scheduled provider appointments Call pharmacy for medication refills 3-7 days in advance of running out of medications Attend church or other social activities Perform all self care activities independently  Perform IADL's (shopping, preparing meals, housekeeping, managing finances) independently Call provider office for new concerns or questions  check blood pressure weekly choose a place to take my blood pressure (home, clinic or office, retail store) write blood pressure results in a log or diary keep a blood pressure log take blood pressure log to all doctor appointments keep all doctor appointments take medications for blood pressure exactly as prescribed report new symptoms to your doctor eat more whole grains, fruits and vegetables, lean meats and healthy fats Continue walking daily - keep up the good work! Follow low sodium diet Read food labels for sodium content  Follow Up Plan: Telephone follow up appointment with care management team member scheduled for:   02/27/23 at 215 pm          Patient verbalizes understanding of instructions and care plan provided today and agrees to view in MyChart. Active MyChart status and patient understanding of how to access instructions and care plan via MyChart confirmed with patient.  Telephone follow up  appointment with care management team member scheduled for:  02/27/23 at 215 pm

## 2022-12-13 NOTE — Chronic Care Management (AMB) (Signed)
Chronic Care Management   CCM RN Visit Note  12/13/2022 Name: Melissa Schneider MRN: [id number] DOB: 16-Sep-1955  Subjective: Melissa Schneider is a 67 y.o. year old female who is a primary care patient of Melissa Ip, DO. The patient was referred to the Chronic Care Management team for assistance with care management needs subsequent to provider initiation of CCM services and plan of care.    Today's Visit:  Engaged with patient by telephone for follow up visit.        Goals Addressed             This Visit's Progress    CCM (DIABETES) EXPECTED OUTCOME: MONITOR, SELF-MANAGE AND REDUCE SYMPTOMS OF DIABETES       Current Barriers:  Knowledge Deficits related to Diabetes management Chronic Disease Management support and education needs related to Diabetes, ciet No Advanced Directives in place- pt has documents on hand Patient reports CBG is monitored with Freestyle Libre and she checks readings randomly against her glucometer to make sure reading correctly. Patient reports FBS ranges in 200, random ranges 124-129, Patient verbalizes understanding of correct treatment for hypoglycemia and stays in touch with primary care provider about blood sugar readings, plan of care, and medication adjustments PharmD has been working with pt  Planned Interventions: Provided education to patient about basic DM disease process; Reviewed medications with patient and discussed importance of medication adherence;        Counseled on importance of regular laboratory monitoring as prescribed;        Discussed plans with patient for ongoing care management follow up and provided patient with direct contact information for care management team;      Advised patient, providing education and rationale, to check cbg per CGM  and record        call provider for findings outside established parameters;       Review of patient status, including review of consultants reports, relevant laboratory and other  test results, and medications completed;       Reinforced carbohydrate modified diet and food choices  Symptom Management: Take medications as prescribed   Attend all scheduled provider appointments Call pharmacy for medication refills 3-7 days in advance of running out of medications Attend church or other social activities Perform all self care activities independently  Perform IADL's (shopping, preparing meals, housekeeping, managing finances) independently Call provider office for new concerns or questions  check blood sugar at prescribed times: per [facility]  check feet daily for cuts, sores or redness enter blood sugar readings and medication or insulin into daily log take the blood sugar log to all doctor visits take the blood sugar meter to all doctor visits trim toenails straight across fill half of plate with vegetables limit fast food meals to no more than 1 per week manage portion size prepare main meal at home 3 to 5 days each week read food labels for fat, fiber, carbohydrates and portion size set a realistic goal Continue walking daily Follow RULE OF 15 for low blood sugar management:  How to treat low blood sugars (Blood sugar less than 70 mg/dl  Please follow the RULE OF 15 for the treatment of hypoglycemia treatment (When your blood sugars are less than 70 mg/ dl) STEP  1:  Take 15 grams of carbohydrates when your blood sugar is low, which includes:   3-4 glucose tabs or  3-4 oz of juice or regular soda or  One tube of glucose gel STEP 2:  Recheck blood sugar in 15 minutes STEP 3:  If your blood sugar is still low at the 15 minute recheck ---then, go back to STEP 1 and treat again with another 15 grams of carbohydrates  Follow Up Plan: Telephone follow up appointment with care management team member scheduled for:   02/27/23 at 215 pm       CCM (HYPERTENSION) EXPECTED OUTCOME: MONITOR, SELF-MANAGE AND REDUCE SYMPTOMS OF HYPERTENSION       Current Barriers:   Knowledge Deficits related to Hypertension management Care Coordination needs related to pharmacy, medication in a patient with Hypertension Chronic Disease Management support and education needs related to Hypertension No Advanced Directives in place- pt reports she has documents on hand to complete Patient reports she lives with spouse, is independent with all aspects of her care, drives, volunteers [address], walks daily for 1.5-3 miles Patient reports she checks blood pressure once per week and states "readings are good" with recent reading 125/73.  Planned Interventions: Evaluation of current treatment plan related to hypertension self management and patient's adherence to plan as established by provider;   Reviewed medications with patient and discussed importance of compliance;  Counseled on the importance of exercise goals with target of 150 minutes per week Advised patient, providing education and rationale, to monitor blood pressure daily and record, calling PCP for findings outside established parameters;  Reinforced low sodium diet  Symptom Management: Take medications as prescribed   Attend all scheduled provider appointments Call pharmacy for medication refills 3-7 days in advance of running out of medications Attend church or other social activities Perform all self care activities independently  Perform IADL's (shopping, preparing meals, housekeeping, managing finances) independently Call provider office for new concerns or questions  check blood pressure weekly choose a place to take my blood pressure (home, clinic or office, retail store) write blood pressure results in a log or diary keep a blood pressure log take blood pressure log to all doctor appointments keep all doctor appointments take medications for blood pressure exactly as prescribed report new symptoms to your doctor eat more whole grains, fruits and vegetables, lean meats and healthy fats Continue  walking daily - keep up the good work! Follow low sodium diet Read food labels for sodium content  Follow Up Plan: Telephone follow up appointment with care management team member scheduled for:   02/27/23 at 215 pm          Plan:Telephone follow up appointment with care management team member scheduled for:  02/27/23 at 215 pm  Irving Shows [facility], BSN RN Case Manager [facility] [phone]

## 2022-12-14 DIAGNOSIS — E1159 Type 2 diabetes mellitus with other circulatory complications: Secondary | ICD-10-CM | POA: Diagnosis not present

## 2022-12-14 DIAGNOSIS — I10 Essential (primary) hypertension: Secondary | ICD-10-CM

## 2022-12-14 DIAGNOSIS — Z794 Long term (current) use of insulin: Secondary | ICD-10-CM

## 2023-01-07 ENCOUNTER — Ambulatory Visit (INDEPENDENT_AMBULATORY_CARE_PROVIDER_SITE_OTHER): Payer: PPO | Admitting: Family Medicine

## 2023-01-07 ENCOUNTER — Telehealth: Payer: Self-pay | Admitting: Family Medicine

## 2023-01-07 VITALS — BP 136/74 | HR 76 | Temp 96.6°F | Resp 20 | Ht 62.0 in | Wt 185.4 lb

## 2023-01-07 DIAGNOSIS — M7662 Achilles tendinitis, left leg: Secondary | ICD-10-CM

## 2023-01-07 DIAGNOSIS — E1159 Type 2 diabetes mellitus with other circulatory complications: Secondary | ICD-10-CM

## 2023-01-07 DIAGNOSIS — E785 Hyperlipidemia, unspecified: Secondary | ICD-10-CM | POA: Diagnosis not present

## 2023-01-07 DIAGNOSIS — Z1211 Encounter for screening for malignant neoplasm of colon: Secondary | ICD-10-CM | POA: Diagnosis not present

## 2023-01-07 DIAGNOSIS — I152 Hypertension secondary to endocrine disorders: Secondary | ICD-10-CM | POA: Diagnosis not present

## 2023-01-07 DIAGNOSIS — E1142 Type 2 diabetes mellitus with diabetic polyneuropathy: Secondary | ICD-10-CM

## 2023-01-07 DIAGNOSIS — E1169 Type 2 diabetes mellitus with other specified complication: Secondary | ICD-10-CM

## 2023-01-07 DIAGNOSIS — Z794 Long term (current) use of insulin: Secondary | ICD-10-CM | POA: Diagnosis not present

## 2023-01-07 DIAGNOSIS — E039 Hypothyroidism, unspecified: Secondary | ICD-10-CM | POA: Diagnosis not present

## 2023-01-07 LAB — BAYER DCA HB A1C WAIVED: HB A1C (BAYER DCA - WAIVED): 7.4 % — ABNORMAL HIGH (ref 4.8–5.6)

## 2023-01-07 MED ORDER — SEMAGLUTIDE (1 MG/DOSE) 4 MG/3ML ~~LOC~~ SOPN
1.0000 mg | PEN_INJECTOR | SUBCUTANEOUS | 3 refills | Status: DC
Start: 2023-01-07 — End: 2023-09-27

## 2023-01-07 MED ORDER — ROSUVASTATIN CALCIUM 20 MG PO TABS: 20.0000 mg | ORAL_TABLET | Freq: Every day | ORAL | 3 refills | Status: DC

## 2023-01-07 NOTE — Progress Notes (Signed)
Subjective: CC:DM PCP: Melissa Ip, DO WUJ:[id number] R Lieder is a 67 y.o. female presenting to clinic today for:  1. Type 2 Diabetes with hypertension, hyperlipidemia w/ DM neuropathy:  She has not really changed her diet much since I saw her last.  She states that the Ozempic has aided in appetite suppression but after 3 to 4 days she is back to where she normally eats.  She does report some episodes of hypoglycemia into the 50s.  She has not confirmed her freestyle libre with a fingerstick glucose but does proceed with eating cookies or juice when this happens.  As of late her blood sugars have been running in the 290s in the morning and then leveled off throughout the day.  Last eye exam: needs Last foot exam: UTD Last A1c:  Lab Results  Component Value Date   HGBA1C 7.1 (H) 10/03/2022   Nephropathy screen indicated?: UTD Last flu, zoster and/or pneumovax:  Immunization History  Administered Date(s) Administered   Influenza Whole 05/16/2010   Influenza,inj,Quad PF,6+ Mos 05/10/2015, 04/26/2016, 06/19/2017, 05/28/2018   Pneumococcal Conjugate-13 01/29/2014   Pneumococcal Polysaccharide-23 07/16/2008, 01/30/2016   Td 07/17/2007   Tdap 07/28/2019   2.Hypothyroidism Compliant with meds.  No reports of changes in bowel habits or tremor  3.  Left heel pain She reports ongoing "ankle pain".  She points to the tip of the back of the heel as the source of discomfort.  She notes that her gait is becoming more impaired due to the pain.  She occasionally uses ice on this area and elevates the ankle in efforts to alleviate it.  Sometimes she rolls her foot on an ice bottle.  She has been avoiding NSAIDs due to impairment of renal function noted on last lab draw.  Instead she is using Tylenol  ROS: Per HPI  Allergies  Allergen Reactions   Codeine Nausea Only and Other (See Comments)    NAUSEA AND CAUSES HER TO HAVE BAD DREAMS   Gabapentin     Confusion    Lisinopril      Coughing   Past Medical History:  Diagnosis Date   Anemia 07/23/2011   Anxiety    Diabetes mellitus    Diabetic gastroparesis (HCC) 07/23/2011   Suspected   Headache(784.0)    Hearing difficulty    Hyperlipidemia    Hypothyroidism    Kidney stones    Microcytic anemia 07/23/2011   Thalassemia minor    ????? not really worked up per pt but daughter has it???    Current Outpatient Medications:    aspirin 81 MG tablet, Take 81 mg by mouth daily., Disp: , Rfl:    Continuous Blood Gluc Sensor (FREESTYLE LIBRE 3 SENSOR) MISC, Place 1 sensor on the skin every 14 days. Use to check glucose continuously E11.69, Disp: 6 each, Rfl: 3   docusate sodium (COLACE) 100 MG capsule, Take 200 mg by mouth daily as needed. , Disp: , Rfl:    glucose blood (GNP EASY TOUCH GLUCOSE TEST) test strip, EVERY DAY, Disp: 100 each, Rfl: 11   insulin aspart (NOVOLOG FLEXPEN) 100 UNIT/ML FlexPen, Inject 10-17 Units into the skin 3 (three) times daily with meals., Disp: 45 mL, Rfl: PRN   insulin glargine (LANTUS) 100 UNIT/ML injection, INJECT 0.22-0.3MLS (22-30 UNITS) INTO THE SKIN AT BEDTIME (Patient taking differently: Inject 10 Units into the skin at bedtime.), Disp: 30 mL, Rfl: PRN   Insulin Pen Needle (PEN NEEDLES) 32G X 4 MM MISC, UAD E11.69, Disp:  100 each, Rfl: 3   Insulin Syringe-Needle U-100 (INSULIN SYRINGE .5CC/31GX5/16") 31G X 5/16" 0.5 ML MISC, UAD with Lantus E11.69, Disp: 100 each, Rfl: 3   levocetirizine (XYZAL) 5 MG tablet, Take 1 tablet (5 mg total) by mouth at bedtime as needed for allergies (itching/ rash)., Disp: 90 tablet, Rfl: 0   levothyroxine (SYNTHROID) 137 MCG tablet, TAKE ONE TABLET EACH MORNING BEFORE BREAKFAST, Disp: 90 tablet, Rfl: 3   losartan (COZAAR) 25 MG tablet, TAKE ONE (1) TABLET EACH DAY, Disp: 90 tablet, Rfl: 3   Multiple Vitamin (MULTIVITAMIN WITH MINERALS) TABS tablet, Take 1 tablet by mouth daily., Disp: , Rfl:    rosuvastatin (CRESTOR) 20 MG tablet, Take 1 tablet (20 mg total) by  mouth daily. (Patient taking differently: Take 20 mg by mouth daily. Takes 2-3 x per week), Disp: 90 tablet, Rfl: 3   Semaglutide,0.25 or 0.5MG /DOS, (OZEMPIC, 0.25 OR 0.5 MG/DOSE,) 2 MG/3ML SOPN, Inject 0.5 mg into the skin every 7 (seven) days., Disp: 9 mL, Rfl: 3   SUMAtriptan (IMITREX) 100 MG tablet, May repeat in 2 hours if headache persists or recurs., Disp: 9 tablet, Rfl: PRN   triamcinolone cream (KENALOG) 0.1 %, Apply 1 application. topically 2 (two) times daily. 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   Occupation: Retired  Tobacco Use   Smoking status: Never   Smokeless tobacco: Never   Tobacco comments:    Never smoked  Vaping Use   Vaping Use: Never used  Substance and Sexual Activity   Alcohol use: No    Alcohol/week: 0.0 standard drinks of alcohol   Drug use: No   Sexual activity: Not Currently  Other Topics Concern   Not on file  Social History Narrative   Lives home with husband - Daughter lives in [place] - she has a great grandchild also   Social Determinants of Health   Financial Resource Strain: Low Risk  (10/08/2022)   Overall Financial Resource Strain (CARDIA)    Difficulty of Paying Living Expenses: Not hard at all  Food Insecurity: No Food Insecurity (10/08/2022)   Hunger Vital Sign    Worried About Running Out of Food in the Last Year: Never true    Ran Out of Food in the Last Year: Never true  Transportation Needs: No Transportation Needs (10/08/2022)   PRAPARE - Administrator, Civil Service (Medical): No    Lack of Transportation (Non-Medical): No  Physical Activity: Sufficiently Active (10/08/2022)   Exercise Vital Sign    Days of Exercise per Week: 7  days    Minutes of Exercise per Session: 30 min  Stress: No Stress Concern Present (10/08/2022)   [employer] of Occupational Health - Occupational Stress Questionnaire    Feeling of Stress : Not at all  Social Connections: Socially Integrated (10/08/2022)   Social Connection and Isolation Panel [NHANES]    Frequency of Communication with Friends and Family: More than three times a week    Frequency of Social Gatherings with Friends and Family: Three times a week    Attends Religious Services: More than 4 times per year    Active Member of Clubs or Organizations:  Yes    Attends Club or Organization Meetings: More than 4 times per year    Marital Status: Married  Catering manager Violence: Not At Risk (10/08/2022)   Humiliation, Afraid, Rape, and Kick questionnaire    Fear of Current or Ex-Partner: No    Emotionally Abused: No    Physically Abused: No    Sexually Abused: No   Family History  Problem Relation Age of Onset   Colon polyps Mother    Rheum arthritis Sister    Breast cancer Maternal Aunt    Breast cancer Maternal Aunt    Colon cancer Maternal Grandmother        age 59   CAD Brother     Objective: Office vital signs reviewed. BP 136/74   Pulse 76   Temp (!) 96.6 F (35.9 C) (Oral)   Resp 20   Ht 5\' 2"  (1.575 m)   Wt 185 lb 6 oz (84.1 kg)   SpO2 95%   BMI 33.91 kg/m   Physical Examination:  General: Awake, alert, obese, No acute distress HEENT: Sclera white.  Moist mucous membranes.  No exophthalmos Cardio: regular rate and rhythm, S1S2 heard, no murmurs appreciated Pulm: clear to auscultation bilaterally, no wheezes, rhonchi or rales; normal work of breathing on room air MSK: Gait is antalgic.  She has point tenderness over the insertion of the Achilles tendon on the left.  There are some mild soft tissue swelling in this area as well.  She has tight calf muscles bilaterally and subsequently plantar flexes at rest Neuro: See diabetic foot  Diabetic Foot  Exam - Simple   Simple Foot Form Visual Inspection No deformities, no ulcerations, no other skin breakdown bilaterally: Yes Sensation Testing Intact to touch and monofilament testing bilaterally: Yes Pulse Check Posterior Tibialis and Dorsalis pulse intact bilaterally: Yes Comments      Assessment/ Plan: 67 y.o. female   Type 2 diabetes mellitus with other specified complication, with long-term current use of insulin (HCC) - Plan: CMP14+EGFR, CBC with Differential/Platelet, Bayer DCA Hb A1c Waived, Semaglutide, 1 MG/DOSE, 4 MG/3ML SOPN  Hypertension associated with diabetes (HCC) - Plan: CMP14+EGFR  Diabetic polyneuropathy associated with type 2 diabetes mellitus (HCC) - Plan: CMP14+EGFR  Hyperlipidemia associated with type 2 diabetes mellitus (HCC) - Plan: Lipid panel, CMP14+EGFR, rosuvastatin (CRESTOR) 20 MG tablet  Acquired hypothyroidism - Plan: TSH, T4, Free  Achilles tendinitis of left lower extremity - Plan: Ambulatory referral to Orthopedic Surgery  Colon cancer screening - Plan: Ambulatory referral to Gastroenterology  Sugar not controlled in fact sugar has risen despite addition of Ozempic.  I am going to advance her to 1 mg weekly.  I will CC CCM as FYI so they can get this changed for patient assistance.  She needs diabetic eye exam.  Will see if we can arrange this in office for her  Blood pressure is controlled.  No changes needed.  Check renal function given mild impairment noted last visit  Sensation was intact on exam today.  No focal abnormalities on exam  Check fasting lipid.  Continue Crestor  Check thyroid levels  I considered referral to the sports medicine clinic for direct visualization of the Achilles under ultrasound and possible nitroglycerin treatment but she wanted to see Dr. Ophelia Charter in [place] because her spouse has seen him before and she felt more comfortable with him so I have placed referral accordingly.  We discussed regular icing of the affected  area, stretching of the gastrocs to reduce tension  on the Achilles  Referral back to [facility] for colon cancer screening.  No orders of the defined types were placed in this encounter.  No orders of the defined types were placed in this encounter.    Melissa Ip, DO [facility] [phone]

## 2023-01-08 LAB — CBC WITH DIFFERENTIAL/PLATELET
Basophils Absolute: 0.1 10*3/uL (ref 0.0–0.2)
Basos: 1 %
EOS (ABSOLUTE): 0.3 10*3/uL (ref 0.0–0.4)
Eos: 4 %
Hematocrit: 42.6 % (ref 34.0–46.6)
Hemoglobin: 12.8 g/dL (ref 11.1–15.9)
Immature Grans (Abs): 0 10*3/uL (ref 0.0–0.1)
Immature Granulocytes: 0 %
Lymphocytes Absolute: 1.8 10*3/uL (ref 0.7–3.1)
Lymphs: 25 %
MCH: 21 pg — ABNORMAL LOW (ref 26.6–33.0)
MCHC: 30 g/dL — ABNORMAL LOW (ref 31.5–35.7)
MCV: 70 fL — ABNORMAL LOW (ref 79–97)
Monocytes Absolute: 0.5 10*3/uL (ref 0.1–0.9)
Monocytes: 7 %
Neutrophils Absolute: 4.6 10*3/uL (ref 1.4–7.0)
Neutrophils: 63 %
Platelets: 306 10*3/uL (ref 150–450)
RBC: 6.1 x10E6/uL — ABNORMAL HIGH (ref 3.77–5.28)
RDW: 14.9 % (ref 11.7–15.4)
WBC: 7.3 10*3/uL (ref 3.4–10.8)

## 2023-01-08 LAB — CMP14+EGFR
ALT: 15 IU/L (ref 0–32)
AST: 17 IU/L (ref 0–40)
Albumin: 4.2 g/dL (ref 3.9–4.9)
Alkaline Phosphatase: 146 IU/L — ABNORMAL HIGH (ref 44–121)
BUN/Creatinine Ratio: 22 (ref 12–28)
BUN: 18 mg/dL (ref 8–27)
Bilirubin Total: <0.2 mg/dL (ref 0.0–1.2)
CO2: 19 mmol/L — ABNORMAL LOW (ref 20–29)
Calcium: 9.9 mg/dL (ref 8.7–10.3)
Chloride: 103 mmol/L (ref 96–106)
Creatinine, Ser: 0.81 mg/dL (ref 0.57–1.00)
Globulin, Total: 3 g/dL (ref 1.5–4.5)
Glucose: 137 mg/dL — ABNORMAL HIGH (ref 70–99)
Potassium: 4.3 mmol/L (ref 3.5–5.2)
Sodium: 138 mmol/L (ref 134–144)
Total Protein: 7.2 g/dL (ref 6.0–8.5)
eGFR: 80 mL/min/{1.73_m2} (ref >59)

## 2023-01-08 LAB — TSH: TSH: 1.11 u[IU]/mL (ref 0.450–4.500)

## 2023-01-08 LAB — T4, FREE: Free T4: 1.61 ng/dL (ref 0.82–1.77)

## 2023-01-08 LAB — LIPID PANEL
Chol/HDL Ratio: 2.1 ratio (ref 0.0–4.4)
Cholesterol, Total: 168 mg/dL (ref 100–199)
HDL: 81 mg/dL (ref >39)
LDL Chol Calc (NIH): 75 mg/dL (ref 0–99)
Triglycerides: 60 mg/dL (ref 0–149)
VLDL Cholesterol Cal: 12 mg/dL (ref 5–40)

## 2023-01-08 NOTE — Telephone Encounter (Signed)
And it looks like she's ready to increase to 1mg  dose pens per recent appt. Let me know & I'll get the form to you!

## 2023-01-09 NOTE — Telephone Encounter (Signed)
Just spoke with patient regarding Ozmepic.   Pt was increased to 1mg .  Shipment issue was addressed. Whenever her last shipment was dispensed to her at the office, the 4 pens that were given to her shouldve been only her order. Pt was told those pens were for her and her husband and that they were SAMPLES. Since then, pt and her husband have been sharing the order, which has caused her to be out early. Her husbands actual shipment processed 12/19/22 and should arrive at the office any day now. He has an appt Friday and will follow up on this.   In the meantime, they will have to continue to share order until her July shipment processes, and so on. Company sees package as delivered and will not ship again until due.  I will fax her 1mg  dose increase form to office for signature.

## 2023-01-10 ENCOUNTER — Encounter: Payer: Self-pay | Admitting: Orthopaedic Surgery

## 2023-01-10 ENCOUNTER — Ambulatory Visit (INDEPENDENT_AMBULATORY_CARE_PROVIDER_SITE_OTHER): Payer: PPO | Admitting: Orthopaedic Surgery

## 2023-01-10 ENCOUNTER — Other Ambulatory Visit (INDEPENDENT_AMBULATORY_CARE_PROVIDER_SITE_OTHER): Payer: PPO

## 2023-01-10 ENCOUNTER — Ambulatory Visit: Payer: PPO | Admitting: Orthopaedic Surgery

## 2023-01-10 ENCOUNTER — Telehealth: Payer: Self-pay | Admitting: Family Medicine

## 2023-01-10 ENCOUNTER — Encounter: Payer: Self-pay | Admitting: *Deleted

## 2023-01-10 VITALS — Ht 62.0 in | Wt 185.0 lb

## 2023-01-10 DIAGNOSIS — M25572 Pain in left ankle and joints of left foot: Secondary | ICD-10-CM

## 2023-01-10 NOTE — Progress Notes (Signed)
Office Visit Note   Patient: Melissa Schneider           Date of Birth: 1955-07-20           MRN: [id number] Visit Date: 01/10/2023              Requested by: Raliegh Ip, DO [address] [place],  [place] [address] PCP: Raliegh Ip, DO   Assessment & Plan: Visit Diagnoses:  1. Pain in left ankle and joints of left foot     Plan: Patient has Achilles tendon insertional tendinopathy with some bursitis directly overlying the tendon.  She also has posterior tibial tendinitis with likely grade 1 tendinopathy with her intermittent symptoms.  Will place her in a cam boot she needs an MRI scan this is been going on for greater than 9 months having trouble getting to work and activities of daily living.  I will follow-up after scan she is placed in a long cam boot today.  Pathophysiology discussed and reviewed.  Follow-Up Instructions: No follow-ups on file.   Orders:  Orders Placed This Encounter  Procedures   XR Ankle Complete Left   MR Foot Left w/o contrast   No orders of the defined types were placed in this encounter.     Procedures: No procedures performed   Clinical Data: No additional findings.   Subjective: Chief Complaint  Patient presents with   Left Ankle - Pain    HPI 67 year old female with greater than a month history of intermittent severe left posterior ankle pain at the Achilles tendon insertion site also medial ankle pain with significant limping.  She was doing yard work working on an incline.  She had great difficulty walking after she got done.  She is use ibuprofen ice without relief.  Start taking Tylenol after too much ibuprofen and states her renal function returned back to normal with the Tylenol but her ankle is "killing her".  She does have diabetes diabetic neuropathy hyperlipidemia, hypertension.  BMI 33.  Review of Systems All systems noncontributory to HPI.  Objective: Vital Signs: Ht 5\' 2"  (1.575 m)   Wt 185 lb (83.9 kg)    BMI 33.84 kg/m   Physical Exam Constitutional:      Appearance: She is well-developed.  HENT:     Head: Normocephalic.     Right Ear: External ear normal.     Left Ear: External ear normal. There is no impacted cerumen.  Eyes:     Pupils: Pupils are equal, round, and reactive to light.  Neck:     Thyroid: No thyromegaly.     Trachea: No tracheal deviation.  Cardiovascular:     Rate and Rhythm: Normal rate.  Pulmonary:     Effort: Pulmonary effort is normal.  Abdominal:     Palpations: Abdomen is soft.  Musculoskeletal:     Cervical back: No rigidity.  Skin:    General: Skin is warm and dry.  Neurological:     Mental Status: She is alert and oriented to person, place, and time.  Psychiatric:        Behavior: Behavior normal.     Ortho Exam patient is pain with resisted posterior tibial testing and significant tenderness at Achilles tendon insertion site with soft tissue swelling over the insertion but no palpable bony ostosis.  No calf atrophy no palpable defect in the Achilles tendon opposite heel is normal.  Specialty Comments:  No specialty comments available.  Imaging: No results found.  PMFS History: Patient Active Problem List   Diagnosis Date Noted   Diabetes mellitus (HCC) 10/03/2022   Hyperlipidemia associated with type 2 diabetes mellitus (HCC) 09/01/2018   Depressed mood 09/01/2018   Low back pain 04/26/2016   Hearing problem of both ears 01/30/2016   Hypertension associated with diabetes (HCC) 11/07/2015   BMI 35.0-35.9,adult 08/11/2015   Migraine 03/17/2015   Diabetic neuropathy (HCC) 03/17/2015   Constipation 04/05/2014   Microcytic anemia 04/05/2014   Recurrent obstructive pyelonephritis 08/14/2013   Obstructive uropathy 08/13/2013   Stone in kidney 08/13/2013   Diabetic gastroparesis (HCC) 07/23/2011   Hypothyroidism 08/24/2010   DM (diabetes mellitus), type 2, uncontrolled 08/24/2010   Past Medical History:  Diagnosis Date   Anemia  07/23/2011   Anxiety    Diabetes mellitus    Diabetic gastroparesis (HCC) 07/23/2011   Suspected   Headache(784.0)    Hearing difficulty    Hyperlipidemia    Hypothyroidism    Kidney stones    Microcytic anemia 07/23/2011   Thalassemia minor    ????? not really worked up per pt but daughter has it???    Family History  Problem Relation Age of Onset   Colon polyps Mother    Rheum arthritis Sister    Breast cancer Maternal Aunt    Breast cancer Maternal Aunt    Colon cancer Maternal Grandmother        age 60   CAD Brother     Past Surgical History:  Procedure Laterality Date   COLONOSCOPY  2004   Dr. Sabino Gasser: small fissue and hemorrhoids   COLONOSCOPY  12/2011   Dr. Dulce Sellar: normal   CYSTOSCOPY W/ URETERAL STENT PLACEMENT Right 08/13/2013   Procedure: CYSTOSCOPY WITH RETROGRADE PYELOGRAM/URETERAL STENT PLACEMENT;  Surgeon: Ky Barban, MD;  Location: [facility];  Service: Urology;  Laterality: Right;   EXTRACORPOREAL SHOCK WAVE LITHOTRIPSY Right 08/19/2013   Procedure: EXTRACORPOREAL SHOCK WAVE LITHOTRIPSY (ESWL) RIGHT URETERAL CALCULUS;  Surgeon: Ky Barban, MD;  Location: [facility];  Service: Urology;  Laterality: Right;   WRIST SURGERY Right    Social History   Occupational History   Occupation: Training and development officer: [employer]   Occupation: Retired  Tobacco Use   Smoking status: Never   Smokeless tobacco: Never   Tobacco comments:    Never smoked  Vaping Use   Vaping Use: Never used  Substance and Sexual Activity   Alcohol use: No    Alcohol/week: 0.0 standard drinks of alcohol   Drug use: No   Sexual activity: Not Currently

## 2023-01-11 NOTE — Telephone Encounter (Signed)
Awaiting shipment  CPhT informed patient of arrival times & plan

## 2023-01-15 NOTE — Telephone Encounter (Signed)
Novo nordisk dose increase form emailed to [place] - for [employer] 1mg  dose pens

## 2023-01-21 ENCOUNTER — Encounter: Payer: Self-pay | Admitting: Family Medicine

## 2023-01-22 NOTE — Telephone Encounter (Signed)
Dose increase form was faxed and rec'd on 01/18/23 by novo nordisk

## 2023-01-22 NOTE — Telephone Encounter (Signed)
Is the remainder of her supply somewhere so we can provide?

## 2023-01-24 ENCOUNTER — Ambulatory Visit (HOSPITAL_BASED_OUTPATIENT_CLINIC_OR_DEPARTMENT_OTHER)
Admission: RE | Admit: 2023-01-24 | Discharge: 2023-01-24 | Disposition: A | Discharge status: Home / Self Care | Payer: PPO | Source: Ambulatory Visit | Attending: Orthopaedic Surgery | Admitting: Orthopaedic Surgery

## 2023-01-24 DIAGNOSIS — M25472 Effusion, left ankle: Secondary | ICD-10-CM | POA: Diagnosis not present

## 2023-01-24 DIAGNOSIS — M25572 Pain in left ankle and joints of left foot: Secondary | ICD-10-CM | POA: Insufficient documentation

## 2023-01-24 DIAGNOSIS — M65872 Other synovitis and tenosynovitis, left ankle and foot: Secondary | ICD-10-CM | POA: Diagnosis not present

## 2023-01-24 DIAGNOSIS — M79672 Pain in left foot: Secondary | ICD-10-CM | POA: Diagnosis not present

## 2023-01-24 DIAGNOSIS — R600 Localized edema: Secondary | ICD-10-CM | POA: Diagnosis not present

## 2023-01-31 ENCOUNTER — Telehealth: Payer: Self-pay | Admitting: Family Medicine

## 2023-02-05 ENCOUNTER — Encounter: Payer: Self-pay | Admitting: Pharmacist

## 2023-02-06 ENCOUNTER — Ambulatory Visit (INDEPENDENT_AMBULATORY_CARE_PROVIDER_SITE_OTHER): Payer: PPO | Admitting: Orthopaedic Surgery

## 2023-02-06 ENCOUNTER — Encounter: Payer: Self-pay | Admitting: Orthopaedic Surgery

## 2023-02-06 VITALS — Ht 62.0 in | Wt 185.0 lb

## 2023-02-06 DIAGNOSIS — M7750 Other enthesopathy of unspecified foot: Secondary | ICD-10-CM

## 2023-02-06 NOTE — Progress Notes (Signed)
Office Visit Note   Patient: Melissa Schneider           Date of Birth: 1956/04/16           MRN: [id number] Visit Date: 02/06/2023              Requested by: Raliegh Ip, DO [address] [place],  [place] [address] PCP: Raliegh Ip, DO   Assessment & Plan: Visit Diagnoses: No diagnosis found.  Plan: Continue cam boot for 3 more weeks then she can gradually wean out of it.  I will check her back in 2 months pathophysiology discussed.  Follow-Up Instructions: Return in about 2 months (around 04/09/2023).   Orders:  No orders of the defined types were placed in this encounter.  No orders of the defined types were placed in this encounter.     Procedures: No procedures performed   Clinical Data: No additional findings.   Subjective: Chief Complaint  Patient presents with   Left Foot - Pain, Follow-up    MRI review    HPI [age]-year-old female returns she is been admitted for 66 weeks for tendinopathy.  MRI scan images were reviewed I gave her a copy of the report.  She uses the boot even at night.  She not been walking her dog.  She states she is not limping out of the boot now but has some mild discomfort sometimes.  MRI shows mild distal Achilles tendon insertional peritendinitis.  Mild edema of Kager's fat pad mild tenosynovitis peroneal brevis peroneal longus and posterior tibial tendon.  Review of Systems updated unchanged   Objective: Vital Signs: Ht 5\' 2"  (1.575 m)   Wt 185 lb (83.9 kg)   BMI 33.84 kg/m   Physical Exam Constitutional:      Appearance: She is well-developed.  HENT:     Head: Normocephalic.     Right Ear: External ear normal.     Left Ear: External ear normal.  Eyes:     Pupils: Pupils are equal, round, and reactive to light.  Neck:     Thyroid: No thyromegaly.     Trachea: No tracheal deviation.  Cardiovascular:     Rate and Rhythm: Normal rate.  Pulmonary:     Effort: Pulmonary effort is normal.  Abdominal:      Palpations: Abdomen is soft.  Skin:    General: Skin is warm and dry.  Neurological:     Mental Status: She is alert and oriented to person, place, and time.  Psychiatric:        Behavior: Behavior normal.     Ortho Exam minimal tenderness distal posterior tibial tendon medial malleolus distal.  Good strength.  Mild tenderness distal Achilles tendon.  Plantar fascia is normal with normal pulses.  Specialty Comments:  No specialty comments available.  Imaging: rrative & Impression  CLINICAL DATA:  Heel pain, chronic posterior tibial tendon/Achilles tendinopathy.   EXAM: MRI OF THE LEFT FOOT WITHOUT CONTRAST   TECHNIQUE: Multiplanar, multisequence MR imaging of the left was performed. No intravenous contrast was administered.   COMPARISON:  None Available.   FINDINGS: TENDONS   Peroneal: Peroneal longus tendon intact. Peroneal brevis intact. Edema along the peroneus brevis and peroneus longus tendons suggesting tenosynovitis.   Posteromedial: Posterior tibial tendon intact with edema along the tibialis posterior suggesting tenosynovitis. Flexor hallucis longus tendon intact. Flexor digitorum longus tendon intact.   Anterior: Tibialis anterior tendon intact. Extensor hallucis longus tendon intact Extensor digitorum longus  tendon intact.   Achilles: Mild thickening of the distal Achilles tendon with edema about the insertion suggesting peritenonitis. Mild edema in the Kager's fat pad.   Plantar Fascia: Intact.   LIGAMENTS   Lateral: Anterior talofibular ligament intact. Calcaneofibular ligament intact. Posterior talofibular ligament intact. Anterior and posterior tibiofibular ligaments intact.   Medial: Deltoid ligament intact. Spring ligament intact.   CARTILAGE   Ankle Joint: Small joint effusion. Normal ankle mortise. No chondral defect.   Subtalar Joints/Sinus Tarsi: Normal subtalar joints. Small subtalar joint effusion. Mild edema of the sinus tarsi.    Bones: No marrow signal abnormality.  No fracture or dislocation.   Soft Tissue: No fluid collection or hematoma. Muscles are normal without edema or atrophy. Tarsal tunnel is normal.   IMPRESSION: 1. Mild thickening of the distal Achilles tendon with edema about the insertion suggesting peritenonitis. Mild edema in the Kager's fat pad. 2. Mild tenosynovitis of the peroneus brevis, peroneus longus and tibialis posterior. 3. Small tibiotalar and subtalar joint effusions. 4. Mild edema of the sinus tarsi. 5. No evidence of fracture or osteonecrosis.     Electronically Signed   By: Larose Hires D.O.   On: 01/31/2023 17:37     PMFS History: Patient Active Problem List   Diagnosis Date Noted   Diabetes mellitus (HCC) 10/03/2022   Hyperlipidemia associated with type 2 diabetes mellitus (HCC) 09/01/2018   Depressed mood 09/01/2018   Low back pain 04/26/2016   Hearing problem of both ears 01/30/2016   Hypertension associated with diabetes (HCC) 11/07/2015   BMI 35.0-35.9,adult 08/11/2015   Migraine 03/17/2015   Diabetic neuropathy (HCC) 03/17/2015   Constipation 04/05/2014   Microcytic anemia 04/05/2014   Recurrent obstructive pyelonephritis 08/14/2013   Obstructive uropathy 08/13/2013   Stone in kidney 08/13/2013   Diabetic gastroparesis (HCC) 07/23/2011   Hypothyroidism 08/24/2010   DM (diabetes mellitus), type 2, uncontrolled 08/24/2010   Past Medical History:  Diagnosis Date   Anemia 07/23/2011   Anxiety    Diabetes mellitus    Diabetic gastroparesis (HCC) 07/23/2011   Suspected   Headache(784.0)    Hearing difficulty    Hyperlipidemia    Hypothyroidism    Kidney stones    Microcytic anemia 07/23/2011   Thalassemia minor    ????? not really worked up per pt but daughter has it???    Family History  Problem Relation Age of Onset   Colon polyps Mother    Rheum arthritis Sister    Breast cancer Maternal Aunt    Breast cancer Maternal Aunt    Colon cancer Maternal  Grandmother        age 61   CAD Brother     Past Surgical History:  Procedure Laterality Date   COLONOSCOPY  2004   Dr. Sabino Gasser: small fissue and hemorrhoids   COLONOSCOPY  12/2011   Dr. Dulce Sellar: normal   CYSTOSCOPY W/ URETERAL STENT PLACEMENT Right 08/13/2013   Procedure: CYSTOSCOPY WITH RETROGRADE PYELOGRAM/URETERAL STENT PLACEMENT;  Surgeon: Ky Barban, MD;  Location: [facility];  Service: Urology;  Laterality: Right;   EXTRACORPOREAL SHOCK WAVE LITHOTRIPSY Right 08/19/2013   Procedure: EXTRACORPOREAL SHOCK WAVE LITHOTRIPSY (ESWL) RIGHT URETERAL CALCULUS;  Surgeon: Ky Barban, MD;  Location: [facility];  Service: Urology;  Laterality: Right;   WRIST SURGERY Right    Social History   Occupational History   Occupation: Training and development officer: [employer]   Occupation: Retired  Tobacco Use   Smoking status: Never  Smokeless tobacco: Never   Tobacco comments:    Never smoked  Vaping Use   Vaping status: Never Used  Substance and Sexual Activity   Alcohol use: No    Alcohol/week: 0.0 standard drinks of alcohol   Drug use: No   Sexual activity: Not Currently

## 2023-02-26 ENCOUNTER — Ambulatory Visit (INDEPENDENT_AMBULATORY_CARE_PROVIDER_SITE_OTHER): Payer: PPO

## 2023-02-26 VITALS — Ht 62.0 in | Wt 185.0 lb

## 2023-02-26 DIAGNOSIS — Z0000 Encounter for general adult medical examination without abnormal findings: Secondary | ICD-10-CM | POA: Diagnosis not present

## 2023-02-26 DIAGNOSIS — Z1211 Encounter for screening for malignant neoplasm of colon: Secondary | ICD-10-CM

## 2023-02-26 NOTE — Progress Notes (Signed)
Subjective:   Melissa Schneider is a 67 y.o. female who presents for Medicare Annual (Subsequent) preventive examination.  Visit Complete: Virtual  I connected with  Melissa Schneider on 02/26/23 by a audio enabled telemedicine application and verified that I am speaking with the correct person using two identifiers.  Patient Location: Home  Provider Location: Home Office  I discussed the limitations of evaluation and management by telemedicine. The patient expressed understanding and agreed to proceed.  Patient Medicare AWV questionnaire was completed by the patient on 02/26/2023; I have confirmed that all information answered by patient is correct and no changes since this date.  Review of Systems    Vital Signs: Unable to obtain new vitals due to this being a telehealth visit.  Cardiac Risk Factors include: advanced age (>64men, >48 women);diabetes mellitus;dyslipidemia;hypertension Nutrition Risk Assessment:  Has the patient had any N/V/D within the last 2 months?  No  Does the patient have any non-healing wounds?  No  Has the patient had any unintentional weight loss or weight gain?  No   Diabetes:  Is the patient diabetic?  Yes  If diabetic, was a CBG obtained today?  No  Did the patient bring in their glucometer from home?  No  How often do you monitor your CBG's? LIBRE.   Financial Strains and Diabetes Management:  Are you having any financial strains with the device, your supplies or your medication? No .  Does the patient want to be seen by Chronic Care Management for management of their diabetes?  No  Would the patient like to be referred to a Nutritionist or for Diabetic Management?  No   Diabetic Exams:  Diabetic Eye Exam: Overdue for diabetic eye exam. Pt has been advised about the importance in completing this exam. Patient advised to call and schedule an eye exam. Diabetic Foot Exam: Overdue, Pt has been advised about the importance in completing this exam.  Pt is scheduled for diabetic foot exam on next office visit .     Objective:    Today's Vitals   02/26/23 1504  Weight: 185 lb (83.9 kg)  Height: 5\' 2"  (1.575 m)   Body mass index is 33.84 kg/m.     02/26/2023    3:08 PM 10/08/2022   10:23 AM 01/22/2022    3:02 PM 01/19/2021   10:50 AM 01/19/2020   10:16 AM 01/12/2019   10:39 AM 01/12/2019   10:38 AM  Advanced Directives  Does Patient Have a Medical Advance Directive? Yes No Yes No No No No  Type of Estate agent of [place];Living will  Healthcare Power of [place];Living will      Does patient want to make changes to medical advance directive?    Yes (MAU/Ambulatory/Procedural Areas - Information given)     Copy of Healthcare Power of Attorney in Chart? No - copy requested  No - copy requested      Would patient like information on creating a medical advance directive?  No - Patient declined  Yes (MAU/Ambulatory/Procedural Areas - Information given) No - Patient declined Yes (MAU/Ambulatory/Procedural Areas - Information given) Yes (MAU/Ambulatory/Procedural Areas - Information given)    Current Medications (verified) Outpatient Encounter Medications as of 02/26/2023  Medication Sig   aspirin 81 MG tablet Take 81 mg by mouth daily.   Continuous Blood Gluc Sensor (FREESTYLE LIBRE 3 SENSOR) MISC Place 1 sensor on the skin every 14 days. Use to check glucose continuously E11.69   docusate sodium (COLACE)  100 MG capsule Take 200 mg by mouth daily as needed.    glucose blood (GNP EASY TOUCH GLUCOSE TEST) test strip EVERY DAY   insulin aspart (NOVOLOG FLEXPEN) 100 UNIT/ML FlexPen Inject 10-17 Units into the skin 3 (three) times daily with meals.   insulin glargine (LANTUS) 100 UNIT/ML injection INJECT 0.22-0.3MLS (22-30 UNITS) INTO THE SKIN AT BEDTIME (Patient taking differently: Inject 10 Units into the skin at bedtime.)   Insulin Pen Needle (PEN NEEDLES) 32G X 4 MM MISC UAD E11.69   Insulin Syringe-Needle U-100  (INSULIN SYRINGE .5CC/31GX5/16") 31G X 5/16" 0.5 ML MISC UAD with Lantus E11.69   levocetirizine (XYZAL) 5 MG tablet Take 1 tablet (5 mg total) by mouth at bedtime as needed for allergies (itching/ rash).   levothyroxine (SYNTHROID) 137 MCG tablet TAKE ONE TABLET EACH MORNING BEFORE BREAKFAST   losartan (COZAAR) 25 MG tablet TAKE ONE (1) TABLET EACH DAY   Multiple Vitamin (MULTIVITAMIN WITH MINERALS) TABS tablet Take 1 tablet by mouth daily.   rosuvastatin (CRESTOR) 20 MG tablet Take 1 tablet (20 mg total) by mouth daily.   Semaglutide, 1 MG/DOSE, 4 MG/3ML SOPN Inject 1 mg as directed once a week.   SUMAtriptan (IMITREX) 100 MG tablet May repeat in 2 hours if headache persists or recurs.   triamcinolone cream (KENALOG) 0.1 % Apply 1 application. topically 2 (two) times daily. X7-10 days for rash   No facility-administered encounter medications on file as of 02/26/2023.    Allergies (verified) Codeine, Gabapentin, and Lisinopril   History: Past Medical History:  Diagnosis Date   Anemia 07/23/2011   Anxiety    Diabetes mellitus    Diabetic gastroparesis (HCC) 07/23/2011   Suspected   Headache(784.0)    Hearing difficulty    Hyperlipidemia    Hypothyroidism    Kidney stones    Microcytic anemia 07/23/2011   Thalassemia minor    ????? not really worked up per pt but daughter has it???   Past Surgical History:  Procedure Laterality Date   COLONOSCOPY  2004   Dr. Sabino Gasser: small fissue and hemorrhoids   COLONOSCOPY  12/2011   Dr. Dulce Sellar: normal   CYSTOSCOPY W/ URETERAL STENT PLACEMENT Right 08/13/2013   Procedure: CYSTOSCOPY WITH RETROGRADE PYELOGRAM/URETERAL STENT PLACEMENT;  Surgeon: Ky Barban, MD;  Location: [facility];  Service: Urology;  Laterality: Right;   EXTRACORPOREAL SHOCK WAVE LITHOTRIPSY Right 08/19/2013   Procedure: EXTRACORPOREAL SHOCK WAVE LITHOTRIPSY (ESWL) RIGHT URETERAL CALCULUS;  Surgeon: Ky Barban, MD;  Location: [facility];  Service: Urology;  Laterality:  Right;   WRIST SURGERY Right    Family History  Problem Relation Age of Onset   Colon polyps Mother    Rheum arthritis Sister    Breast cancer Maternal Aunt    Breast cancer Maternal Aunt    Colon cancer Maternal Grandmother        age 20   CAD Brother    Social History   Socioeconomic History   Marital status: Married    Spouse name: Susann Givens   Number of children: 1   Years of education: Not on file   Highest education level: Some college, no degree  Occupational History   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   Occupation: Retired  Tobacco Use   Smoking status: Never   Smokeless tobacco: Never   Tobacco comments:    Never smoked  Vaping Use   Vaping status: Never Used  Substance and Sexual Activity  Alcohol use: No    Alcohol/week: 0.0 standard drinks of alcohol   Drug use: No   Sexual activity: Not Currently  Other Topics Concern   Not on file  Social History Narrative   Lives home with husband - Daughter lives in [place] - she has a great grandchild also   Social Determinants of Health   Financial Resource Strain: Low Risk  (02/26/2023)   Overall Financial Resource Strain (CARDIA)    Difficulty of Paying Living Expenses: Not hard at all  Food Insecurity: No Food Insecurity (02/26/2023)   Hunger Vital Sign    Worried About Running Out of Food in the Last Year: Never true    Ran Out of Food in the Last Year: Never true  Transportation Needs: No Transportation Needs (02/26/2023)   PRAPARE - Administrator, Civil Service (Medical): No    Lack of Transportation (Non-Medical): No  Physical Activity: Inactive (02/26/2023)   Exercise Vital Sign    Days of Exercise per Week: 0 days    Minutes of Exercise per Session: 0 min  Stress: No Stress Concern Present (02/26/2023)   [employer] of Occupational Health - Occupational Stress Questionnaire    Feeling of Stress : Not at all  Social Connections: Socially Integrated  (02/26/2023)   Social Connection and Isolation Panel [NHANES]    Frequency of Communication with Friends and Family: More than three times a week    Frequency of Social Gatherings with Friends and Family: More than three times a week    Attends Religious Services: More than 4 times per year    Active Member of [employer] or Organizations: Yes    Attends Engineer, structural: More than 4 times per year    Marital Status: Married    Tobacco Counseling Counseling given: Not Answered Tobacco comments: Never smoked   Clinical Intake:  Pre-visit preparation completed: Yes  Pain : No/denies pain     Nutritional Risks: None Diabetes: Yes CBG done?: No Did pt. bring in CBG monitor from home?: No  How often do you need to have someone help you when you read instructions, pamphlets, or other written materials from your doctor or pharmacy?: 1 - Never  Interpreter Needed?: No  Information entered by :: Renie Ora, LPN   Activities of Daily Living    02/26/2023    3:08 PM  In your present state of health, do you have any difficulty performing the following activities:  Hearing? 0  Vision? 0  Difficulty concentrating or making decisions? 0  Walking or climbing stairs? 0  Dressing or bathing? 0  Doing errands, shopping? 0  Preparing Food and eating ? N  Using the Toilet? N  In the past six months, have you accidently leaked urine? N  Do you have problems with loss of bowel control? N  Managing your Medications? N  Managing your Finances? N  Housekeeping or managing your Housekeeping? N    Patient Care Team: Raliegh Ip, DO as PCP - General (Family Medicine) Wyline Mood Dorothe Pea, MD as PCP - Cardiology (Cardiology) Jena Gauss Gerrit Friends, MD as Consulting Physician (Gastroenterology) Sharlene Dory, NP as Nurse Practitioner (Cardiology) Audrie Gallus, RN as [facility] Delora Fuel, OD (Optometry)  Indicate any recent Medical Services  you may have received from other than [facility] providers in the past year (date may be approximate).     Assessment:   This is a routine wellness examination for Jesiah.  Hearing/Vision screen  Vision Screening - Comments:: Wears rx glasses - up to date with routine eye exams with  dr.Dobias   Dietary issues and exercise activities discussed:     Goals Addressed   None    Depression Screen    02/26/2023    3:07 PM 01/07/2023   11:38 AM 10/08/2022    9:54 AM 10/03/2022    3:19 PM 05/22/2022    9:31 AM 02/20/2022    9:38 AM 01/22/2022    2:56 PM  PHQ 2/9 Scores  PHQ - 2 Score 0 0 0 0 0 0 0  PHQ- 9 Score 0 0  0       Fall Risk    01/07/2023   11:37 AM 10/03/2022    3:19 PM 05/22/2022    9:31 AM 02/20/2022    9:38 AM 01/22/2022    3:03 PM  Fall Risk   Falls in the past year? 0 0 0 0 0  Number falls in past yr:     0  Injury with Fall?  0   0  Risk for fall due to :  No Fall Risks   No Fall Risks  Follow up Falls evaluation completed Education provided   Falls prevention discussed    MEDICARE RISK AT HOME:  Medicare Risk at Home - 02/26/23 1506     Any stairs in or around the home? Yes    If so, are there any without handrails? No    Home free of loose throw rugs in walkways, pet beds, electrical cords, etc? Yes    Adequate lighting in your home to reduce risk of falls? Yes    Life alert? No    Use of a cane, walker or w/c? No    Grab bars in the bathroom? No    Shower chair or bench in shower? No    Elevated toilet seat or a handicapped toilet? Yes             TIMED UP AND GO:  Was the test performed?  No    Cognitive Function:        02/26/2023    3:08 PM 01/22/2022    3:05 PM 01/19/2020   10:17 AM 01/12/2019   10:39 AM  6CIT Screen  What Year? 0 points 0 points 0 points 0 points  What month? 0 points 0 points 0 points 0 points  What time? 0 points 0 points 0 points 0 points  Count back from 20 0 points 0 points 2 points 0 points  Months in reverse 0 points 0  points 0 points 0 points  Repeat phrase 0 points 0 points 0 points 0 points  Total Score 0 points 0 points 2 points 0 points    Immunizations Immunization History  Administered Date(s) Administered   Influenza Whole 05/16/2010   Influenza,inj,Quad PF,6+ Mos 05/10/2015, 04/26/2016, 06/19/2017, 05/28/2018   Pneumococcal Conjugate-13 01/29/2014   Pneumococcal Polysaccharide-23 07/16/2008, 01/30/2016   Td 07/17/2007   Tdap 07/28/2019    TDAP status: Up to date  Flu Vaccine status: Up to date  Pneumococcal vaccine status: Up to date  Covid-19 vaccine status: Completed vaccines  Qualifies for Shingles Vaccine? Yes   Zostavax completed No   Shingrix Completed?: No.    Education has been provided regarding the importance of this vaccine. Patient has been advised to call insurance company to determine out of pocket expense if they have not yet received this vaccine. Advised may also receive vaccine at local pharmacy or  Health Dept. Verbalized acceptance and understanding.  Screening Tests Health Maintenance  Topic Date Due   Zoster Vaccines- Shingrix (1 of 2) Never done   Colonoscopy  01/09/2017   OPHTHALMOLOGY EXAM  03/07/2022   COVID-19 Vaccine (1 - 2023-24 season) Never done   INFLUENZA VACCINE  02/14/2023   Pneumonia Vaccine 83+ Years old (3 of 3 - PPSV23 or PCV20) 05/22/2023 (Originally 10/22/2021)   MAMMOGRAM  05/22/2023   Diabetic kidney evaluation - Urine ACR  05/23/2023   HEMOGLOBIN A1C  07/09/2023   FOOT EXAM  10/03/2023   Diabetic kidney evaluation - eGFR measurement  01/07/2024   Medicare Annual Wellness (AWV)  02/26/2024   DEXA SCAN  05/23/2024   DTaP/Tdap/Td (3 - Td or Tdap) 07/27/2029   Hepatitis C Screening  Completed   HPV VACCINES  Aged Out    Health Maintenance  Health Maintenance Due  Topic Date Due   Zoster Vaccines- Shingrix (1 of 2) Never done   Colonoscopy  01/09/2017   OPHTHALMOLOGY EXAM  03/07/2022   COVID-19 Vaccine (1 - 2023-24 season) Never  done   INFLUENZA VACCINE  02/14/2023    Colorectal cancer screening: Referral to GI placed 02/26/2023. Pt aware the office will call re: appt.  Mammogram status: Completed 118/12/2021. Repeat every year  Bone Density status: Completed 05/23/2022. Results reflect: Bone density results: OSTEOPOROSIS. Repeat every 2 years.  Lung Cancer Screening: (Low Dose CT Chest recommended if Age 41-80 years, 20 pack-year currently smoking OR have quit w/in 15years.) does not qualify.   Lung Cancer Screening Referral: n/a  Additional Screening:  Hepatitis C Screening: does not qualify; Completed 01/30/2016  Vision Screening: Recommended annual ophthalmology exams for early detection of glaucoma and other disorders of the eye. Is the patient up to date with their annual eye exam?  Yes  Who is the provider or what is the name of the office in which the patient attends annual eye exams? Dr.Gigante  If pt is not established with a provider, would they like to be referred to a provider to establish care? No .   Dental Screening: Recommended annual dental exams for proper oral hygiene    Community Resource Referral / Chronic Care Management: CRR required this visit?  No   CCM required this visit?  No     Plan:     I have personally reviewed and noted the following in the patient's chart:   Medical and social history Use of alcohol, tobacco or illicit drugs  Current medications and supplements including opioid prescriptions. Patient is not currently taking opioid prescriptions. Functional ability and status Nutritional status Physical activity Advanced directives List of other physicians Hospitalizations, surgeries, and ER visits in previous 12 months Vitals Screenings to include cognitive, depression, and falls Referrals and appointments  In addition, I have reviewed and discussed with patient certain preventive protocols, quality metrics, and best practice recommendations. A written  personalized care plan for preventive services as well as general preventive health recommendations were provided to patient.     Lorrene Reid, LPN   1/61/0960   After Visit Summary: (MyChart) Due to this being a telephonic visit, the after visit summary with patients personalized plan was offered to patient via MyChart   Nurse Notes: none

## 2023-02-26 NOTE — Patient Instructions (Signed)
Melissa Schneider , Thank you for taking time to come for your Medicare Wellness Visit. I appreciate your ongoing commitment to your health goals. Please review the following plan we discussed and let me know if I can assist you in the future.   Referrals/Orders/Follow-Ups/Clinician Recommendations: Aim for 30 minutes of exercise or brisk walking, 6-8 glasses of water, and 5 servings of fruits and vegetables each day.   This is a list of the screening recommended for you and due dates:  Health Maintenance  Topic Date Due   Zoster (Shingles) Vaccine (1 of 2) Never done   Colon Cancer Screening  01/09/2017   Eye exam for diabetics  03/07/2022   COVID-19 Vaccine (1 - 2023-24 season) Never done   Flu Shot  02/14/2023   Pneumonia Vaccine (3 of 3 - PPSV23 or PCV20) 05/22/2023*   Mammogram  05/22/2023   Yearly kidney health urinalysis for diabetes  05/23/2023   Hemoglobin A1C  07/09/2023   Complete foot exam   10/03/2023   Yearly kidney function blood test for diabetes  01/07/2024   Medicare Annual Wellness Visit  02/26/2024   DEXA scan (bone density measurement)  05/23/2024   DTaP/Tdap/Td vaccine (3 - Td or Tdap) 07/27/2029   Hepatitis C Screening  Completed   HPV Vaccine  Aged Out  *Topic was postponed. The date shown is not the original due date.    Advanced directives: (Copy Requested) Please bring a copy of your health care power of attorney and living will to the office to be added to your chart at your convenience.  Next Medicare Annual Wellness Visit scheduled for next year: Yes  Preventive Care 74 Years and Older, Female Preventive care refers to lifestyle choices and visits with your health care provider that can promote health and wellness. What does preventive care include? A yearly physical exam. This is also called an annual well check. Dental exams once or twice a year. Routine eye exams. Ask your health care provider how often you should have your eyes checked. Personal  lifestyle choices, including: Daily care of your teeth and gums. Regular physical activity. Eating a healthy diet. Avoiding tobacco and drug use. Limiting alcohol use. Practicing safe sex. Taking low-dose aspirin every day. Taking vitamin and mineral supplements as recommended by your health care provider. What happens during an annual well check? The services and screenings done by your health care provider during your annual well check will depend on your age, overall health, lifestyle risk factors, and family history of disease. Counseling  Your health care provider may ask you questions about your: Alcohol use. Tobacco use. Drug use. Emotional well-being. Home and relationship well-being. Sexual activity. Eating habits. History of falls. Memory and ability to understand (cognition). Work and work Astronomer. Reproductive health. Screening  You may have the following tests or measurements: Height, weight, and BMI. Blood pressure. Lipid and cholesterol levels. These may be checked every 5 years, or more frequently if you are over 73 years old. Skin check. Lung cancer screening. You may have this screening every year starting at age 17 if you have a 30-pack-year history of smoking and currently smoke or have quit within the past 15 years. Fecal occult blood test (FOBT) of the stool. You may have this test every year starting at age 28. Flexible sigmoidoscopy or colonoscopy. You may have a sigmoidoscopy every 5 years or a colonoscopy every 10 years starting at age 62. Hepatitis C blood test. Hepatitis B blood test. Sexually transmitted disease (  STD) testing. Diabetes screening. This is done by checking your blood sugar (glucose) after you have not eaten for a while (fasting). You may have this done every 1-3 years. Bone density scan. This is done to screen for osteoporosis. You may have this done starting at age 72. Mammogram. This may be done every 1-2 years. Talk to your  health care provider about how often you should have regular mammograms. Talk with your health care provider about your test results, treatment options, and if necessary, the need for more tests. Vaccines  Your health care provider may recommend certain vaccines, such as: Influenza vaccine. This is recommended every year. Tetanus, diphtheria, and acellular pertussis (Tdap, Td) vaccine. You may need a Td booster every 10 years. Zoster vaccine. You may need this after age 40. Pneumococcal 13-valent conjugate (PCV13) vaccine. One dose is recommended after age 7. Pneumococcal polysaccharide (PPSV23) vaccine. One dose is recommended after age 57. Talk to your health care provider about which screenings and vaccines you need and how often you need them. This information is not intended to replace advice given to you by your health care provider. Make sure you discuss any questions you have with your health care provider. Document Released: 07/29/2015 Document Revised: 03/21/2016 Document Reviewed: 05/03/2015 Elsevier Interactive Patient Education  2017 [employer].  Fall Prevention in the Home Falls can cause injuries. They can happen to people of all ages. There are many things you can do to make your home safe and to help prevent falls. What can I do on the outside of my home? Regularly fix the edges of walkways and driveways and fix any cracks. Remove anything that might make you trip as you walk through a door, such as a raised step or threshold. Trim any bushes or trees on the path to your home. Use bright outdoor lighting. Clear any walking paths of anything that might make someone trip, such as rocks or tools. Regularly check to see if handrails are loose or broken. Make sure that both sides of any steps have handrails. Any raised decks and porches should have guardrails on the edges. Have any leaves, snow, or ice cleared regularly. Use sand or salt on walking paths during winter. Clean  up any spills in your garage right away. This includes oil or grease spills. What can I do in the bathroom? Use night lights. Install grab bars by the toilet and in the tub and shower. Do not use towel bars as grab bars. Use non-skid mats or decals in the tub or shower. If you need to sit down in the shower, use a plastic, non-slip stool. Keep the floor dry. Clean up any water that spills on the floor as soon as it happens. Remove soap buildup in the tub or shower regularly. Attach bath mats securely with double-sided non-slip rug tape. Do not have throw rugs and other things on the floor that can make you trip. What can I do in the bedroom? Use night lights. Make sure that you have a light by your bed that is easy to reach. Do not use any sheets or blankets that are too big for your bed. They should not hang down onto the floor. Have a firm chair that has side arms. You can use this for support while you get dressed. Do not have throw rugs and other things on the floor that can make you trip. What can I do in the kitchen? Clean up any spills right away. Avoid walking on  wet floors. Keep items that you use a lot in easy-to-reach places. If you need to reach something above you, use a strong step stool that has a grab bar. Keep electrical cords out of the way. Do not use floor polish or wax that makes floors slippery. If you must use wax, use non-skid floor wax. Do not have throw rugs and other things on the floor that can make you trip. What can I do with my stairs? Do not leave any items on the stairs. Make sure that there are handrails on both sides of the stairs and use them. Fix handrails that are broken or loose. Make sure that handrails are as long as the stairways. Check any carpeting to make sure that it is firmly attached to the stairs. Fix any carpet that is loose or worn. Avoid having throw rugs at the top or bottom of the stairs. If you do have throw rugs, attach them to the  floor with carpet tape. Make sure that you have a light switch at the top of the stairs and the bottom of the stairs. If you do not have them, ask someone to add them for you. What else can I do to help prevent falls? Wear shoes that: Do not have high heels. Have rubber bottoms. Are comfortable and fit you well. Are closed at the toe. Do not wear sandals. If you use a stepladder: Make sure that it is fully opened. Do not climb a closed stepladder. Make sure that both sides of the stepladder are locked into place. Ask someone to hold it for you, if possible. Clearly mark and make sure that you can see: Any grab bars or handrails. First and last steps. Where the edge of each step is. Use tools that help you move around (mobility aids) if they are needed. These include: Canes. Walkers. Scooters. Crutches. Turn on the lights when you go into a dark area. Replace any light bulbs as soon as they burn out. Set up your furniture so you have a clear path. Avoid moving your furniture around. If any of your floors are uneven, fix them. If there are any pets around you, be aware of where they are. Review your medicines with your doctor. Some medicines can make you feel dizzy. This can increase your chance of falling. Ask your doctor what other things that you can do to help prevent falls. This information is not intended to replace advice given to you by your health care provider. Make sure you discuss any questions you have with your health care provider. Document Released: 04/28/2009 Document Revised: 12/08/2015 Document Reviewed: 08/06/2014 Elsevier Interactive Patient Education  2017 [employer].

## 2023-02-27 ENCOUNTER — Other Ambulatory Visit: Payer: PPO | Admitting: *Deleted

## 2023-02-27 ENCOUNTER — Encounter: Payer: Self-pay | Admitting: *Deleted

## 2023-02-27 ENCOUNTER — Telehealth: Payer: PPO

## 2023-02-27 NOTE — Patient Instructions (Signed)
Visit Information  Thank you for taking time to visit with me today. Please don't hesitate to contact me if I can be of assistance to you before our next scheduled telephone appointment.  Following are the goals we discussed today:   Goals Addressed             This Visit's Progress    CCM (DIABETES) EXPECTED OUTCOME: MONITOR, SELF-MANAGE AND REDUCE SYMPTOMS OF DIABETES       Current Barriers:  Knowledge Deficits related to Diabetes management Chronic Disease Management support and education needs related to Diabetes, ciet No Advanced Directives in place- pt has documents on hand Patient reports CBG is monitored with Freestyle Libre and she checks readings randomly against her glucometer to make sure reading correctly. Patient reports FBS ranges in 100-110 range, random ranges 160-200, Patient verbalizes understanding of correct treatment for hypoglycemia and stays in touch with primary care provider about blood sugar readings, plan of care, and medication adjustments, pt feels CBG readings are much improved PharmD has been working with pt  Planned Interventions: Provided education to patient about basic DM disease process; Reviewed medications with patient and discussed importance of medication adherence;        Counseled on importance of regular laboratory monitoring as prescribed;        Discussed plans with patient for ongoing care management follow up and provided patient with direct contact information for care management team;      Advised patient, providing education and rationale, to check cbg per CGM  and record        call provider for findings outside established parameters;       Review of patient status, including review of consultants reports, relevant laboratory and other test results, and medications completed;       Reinforced carbohydrate modified diet and food choices  Symptom Management: Take medications as prescribed   Attend all scheduled provider  appointments Call pharmacy for medication refills 3-7 days in advance of running out of medications Attend church or other social activities Perform all self care activities independently  Perform IADL's (shopping, preparing meals, housekeeping, managing finances) independently Call provider office for new concerns or questions  check blood sugar at prescribed times: per [facility]  check feet daily for cuts, sores or redness enter blood sugar readings and medication or insulin into daily log take the blood sugar log to all doctor visits take the blood sugar meter to all doctor visits trim toenails straight across fill half of plate with vegetables limit fast food meals to no more than 1 per week manage portion size prepare main meal at home 3 to 5 days each week read food labels for fat, fiber, carbohydrates and portion size set a realistic goal Continue walking daily- keep up the good work! Follow RULE OF 15 for low blood sugar management:  How to treat low blood sugars (Blood sugar less than 70 mg/dl  Please follow the RULE OF 15 for the treatment of hypoglycemia treatment (When your blood sugars are less than 70 mg/ dl) STEP  1:  Take 15 grams of carbohydrates when your blood sugar is low, which includes:   3-4 glucose tabs or  3-4 oz of juice or regular soda or  One tube of glucose gel STEP 2:  Recheck blood sugar in 15 minutes STEP 3:  If your blood sugar is still low at the 15 minute recheck ---then, go back to STEP 1 and treat again with another 15 grams  of carbohydrates  Follow Up Plan: Telephone follow up appointment with care management team member scheduled for:   05/23/23 at 1045 am       CCM (HYPERTENSION) EXPECTED OUTCOME: MONITOR, SELF-MANAGE AND REDUCE SYMPTOMS OF HYPERTENSION       Current Barriers:  Knowledge Deficits related to Hypertension management Care Coordination needs related to pharmacy, medication in a patient with Hypertension Chronic Disease  Management support and education needs related to Hypertension No Advanced Directives in place- pt reports she has documents on hand to complete Patient reports she lives with spouse, is independent with all aspects of her care, drives, volunteers [address], walks daily for 1.5-3 miles Patient reports she checks blood pressure once per week and states "readings are good" Patient does still have occasional migrane and reports Imitrex is helping her manage  Planned Interventions: Evaluation of current treatment plan related to hypertension self management and patient's adherence to plan as established by provider;   Reviewed medications with patient and discussed importance of compliance;  Counseled on the importance of exercise goals with target of 150 minutes per week Advised patient, providing education and rationale, to monitor blood pressure daily and record, calling PCP for findings outside established parameters;  Reviewed low sodium diet  Symptom Management: Take medications as prescribed   Attend all scheduled provider appointments Call pharmacy for medication refills 3-7 days in advance of running out of medications Attend church or other social activities Perform all self care activities independently  Perform IADL's (shopping, preparing meals, housekeeping, managing finances) independently Call provider office for new concerns or questions  check blood pressure weekly choose a place to take my blood pressure (home, clinic or office, retail store) write blood pressure results in a log or diary keep a blood pressure log take blood pressure log to all doctor appointments keep all doctor appointments take medications for blood pressure exactly as prescribed report new symptoms to your doctor eat more whole grains, fruits and vegetables, lean meats and healthy fats Continue walking daily - keep up the good work! Follow low sodium diet Read food labels for sodium content Limit/  avoid fast food  Follow Up Plan: Telephone follow up appointment with care management team member scheduled for:   05/23/23 at 1045 am           Our next appointment is by telephone on 05/23/23 at 1045 am  Please call the care guide team at [phone] if you need to cancel or reschedule your appointment.   If you are experiencing a Mental Health or Behavioral Health Crisis or need someone to talk to, please call the Suicide and Crisis Lifeline: 988 call the [place] National Suicide Prevention Lifeline: 8507331742 or TTY: 6296813087 TTY (540) 363-8686) to talk to a trained counselor call 1-800-273-TALK (toll free, 24 hour hotline) go to [facility] [address], [place] [phone]) call the [facility] Crisis Line: ([phone]) [phone] call 911   Patient verbalizes understanding of instructions and care plan provided today and agrees to view in MyChart. Active MyChart status and patient understanding of how to access instructions and care plan via MyChart confirmed with patient.     Telephone follow up appointment with care management team member scheduled for: 05/23/23 at 1045 am  Irving Shows [facility], BSN [place]/ Ambulatory Care Management [phone]

## 2023-02-27 NOTE — Patient Outreach (Signed)
Care Management   Visit Note  02/27/2023 Name: [name] MRN: [id number] DOB: 01-17-1956  Subjective: Melissa Schneider is a 67 y.o. year old female who is a primary care patient of Raliegh Ip, DO. The Care Management team was consulted for assistance.      Engaged with patient spoke with patient by telephone for follow up   Goals Addressed             This Visit's Progress    CCM (DIABETES) EXPECTED OUTCOME: MONITOR, SELF-MANAGE AND REDUCE SYMPTOMS OF DIABETES       Current Barriers:  Knowledge Deficits related to Diabetes management Chronic Disease Management support and education needs related to Diabetes, ciet No Advanced Directives in place- pt has documents on hand Patient reports CBG is monitored with Freestyle Libre and she checks readings randomly against her glucometer to make sure reading correctly. Patient reports FBS ranges in 100-110 range, random ranges 160-200, Patient verbalizes understanding of correct treatment for hypoglycemia and stays in touch with primary care provider about blood sugar readings, plan of care, and medication adjustments, pt feels CBG readings are much improved PharmD has been working with pt  Planned Interventions: Provided education to patient about basic DM disease process; Reviewed medications with patient and discussed importance of medication adherence;        Counseled on importance of regular laboratory monitoring as prescribed;        Discussed plans with patient for ongoing care management follow up and provided patient with direct contact information for care management team;      Advised patient, providing education and rationale, to check cbg per CGM  and record        call provider for findings outside established parameters;       Review of patient status, including review of consultants reports, relevant laboratory and other test results, and medications completed;       Reinforced carbohydrate modified diet and  food choices  Symptom Management: Take medications as prescribed   Attend all scheduled provider appointments Call pharmacy for medication refills 3-7 days in advance of running out of medications Attend church or other social activities Perform all self care activities independently  Perform IADL's (shopping, preparing meals, housekeeping, managing finances) independently Call provider office for new concerns or questions  check blood sugar at prescribed times: per [facility]  check feet daily for cuts, sores or redness enter blood sugar readings and medication or insulin into daily log take the blood sugar log to all doctor visits take the blood sugar meter to all doctor visits trim toenails straight across fill half of plate with vegetables limit fast food meals to no more than 1 per week manage portion size prepare main meal at home 3 to 5 days each week read food labels for fat, fiber, carbohydrates and portion size set a realistic goal Continue walking daily- keep up the good work! Follow RULE OF 15 for low blood sugar management:  How to treat low blood sugars (Blood sugar less than 70 mg/dl  Please follow the RULE OF 15 for the treatment of hypoglycemia treatment (When your blood sugars are less than 70 mg/ dl) STEP  1:  Take 15 grams of carbohydrates when your blood sugar is low, which includes:   3-4 glucose tabs or  3-4 oz of juice or regular soda or  One tube of glucose gel STEP 2:  Recheck blood sugar in 15 minutes STEP 3:  If your blood  sugar is still low at the 15 minute recheck ---then, go back to STEP 1 and treat again with another 15 grams of carbohydrates  Follow Up Plan: Telephone follow up appointment with care management team member scheduled for:   05/23/23 at 1045 am       CCM (HYPERTENSION) EXPECTED OUTCOME: MONITOR, SELF-MANAGE AND REDUCE SYMPTOMS OF HYPERTENSION       Current Barriers:  Knowledge Deficits related to Hypertension management Care  Coordination needs related to pharmacy, medication in a patient with Hypertension Chronic Disease Management support and education needs related to Hypertension No Advanced Directives in place- pt reports she has documents on hand to complete Patient reports she lives with spouse, is independent with all aspects of her care, drives, volunteers [address], walks daily for 1.5-3 miles Patient reports she checks blood pressure once per week and states "readings are good" Patient does still have occasional migrane and reports Imitrex is helping her manage  Planned Interventions: Evaluation of current treatment plan related to hypertension self management and patient's adherence to plan as established by provider;   Reviewed medications with patient and discussed importance of compliance;  Counseled on the importance of exercise goals with target of 150 minutes per week Advised patient, providing education and rationale, to monitor blood pressure daily and record, calling PCP for findings outside established parameters;  Reviewed low sodium diet  Symptom Management: Take medications as prescribed   Attend all scheduled provider appointments Call pharmacy for medication refills 3-7 days in advance of running out of medications Attend church or other social activities Perform all self care activities independently  Perform IADL's (shopping, preparing meals, housekeeping, managing finances) independently Call provider office for new concerns or questions  check blood pressure weekly choose a place to take my blood pressure (home, clinic or office, retail store) write blood pressure results in a log or diary keep a blood pressure log take blood pressure log to all doctor appointments keep all doctor appointments take medications for blood pressure exactly as prescribed report new symptoms to your doctor eat more whole grains, fruits and vegetables, lean meats and healthy fats Continue walking  daily - keep up the good work! Follow low sodium diet Read food labels for sodium content Limit/ avoid fast food  Follow Up Plan: Telephone follow up appointment with care management team member scheduled for:   05/23/23 at 1045 am             Plan: Telephone follow up appointment with care management team member scheduled for:  05/23/23 at 1045 am  Irving Shows [facility], BSN [place]/ Ambulatory Care Management [phone]

## 2023-03-06 ENCOUNTER — Other Ambulatory Visit: Payer: Self-pay | Admitting: Family Medicine

## 2023-03-06 DIAGNOSIS — E1169 Type 2 diabetes mellitus with other specified complication: Secondary | ICD-10-CM

## 2023-03-15 ENCOUNTER — Telehealth: Payer: Self-pay | Admitting: Pharmacist

## 2023-03-15 NOTE — Telephone Encounter (Signed)
   Patient enrolled in the [employer] patient assistance program for [employer].  Dose recently increased to 1mg  (0.5mg  pens arrived).  I will send to [facility]Ph[facility] to clarify dose.  Patient should be on Ozempic 1mg  weekly.  Patient is stable on current regimen.  She will need to re-enroll for 2025 in November.  Kieth Brightly, PharmD, BCACP Clinical Pharmacist, [facility]

## 2023-03-20 ENCOUNTER — Encounter: Payer: Self-pay | Admitting: Pharmacist

## 2023-03-20 NOTE — Progress Notes (Signed)
Pharmacy Quality Measure Review  This patient is appearing on a report for being at risk of failing the adherence measure for hypertension (ACEi/ARB) medications this calendar year.   Medication: losartan 25 mg Last fill date: 01/18/23 for 90 day supply  Insurance report was not up to date. No action needed at this time.   Adam Phenix, PharmD PGY-1 Pharmacy Resident

## 2023-03-20 NOTE — Progress Notes (Signed)
Pharmacy Quality Measure Review  This patient is appearing on a report for being at risk of failing the adherence measure for cholesterol (statin) medications this calendar year.   Medication: rosuvastatin 20 mg Last fill date: 01/07/23 for 90 day supply  Insurance report was not up to date. No action needed at this time.    Adam Phenix, PharmD PGY-1 Pharmacy Resident

## 2023-04-04 ENCOUNTER — Encounter: Payer: Self-pay | Admitting: Pharmacist

## 2023-04-10 ENCOUNTER — Ambulatory Visit (INDEPENDENT_AMBULATORY_CARE_PROVIDER_SITE_OTHER): Payer: PPO | Admitting: Orthopaedic Surgery

## 2023-04-10 ENCOUNTER — Encounter: Payer: Self-pay | Admitting: Orthopaedic Surgery

## 2023-04-10 VITALS — Ht 62.0 in

## 2023-04-10 DIAGNOSIS — M7750 Other enthesopathy of unspecified foot: Secondary | ICD-10-CM | POA: Diagnosis not present

## 2023-04-10 NOTE — Progress Notes (Signed)
Office Visit Note   Patient: Melissa Schneider           Date of Birth: 1955-10-10           MRN: [id number] Visit Date: 04/10/2023              Requested by: Raliegh Ip, DO [address] [place],  [place] [address] [address] [place],  [place] [address] PCP: Raliegh Ip, DO   Assessment & Plan: Visit Diagnoses:  1. Tendinitis of ankle     Plan: Patient done well with the treatment with cam boot she will return if she has increased symptoms if the cam boot is not effective and fixing her problem.  She is happy with results of treatment return as needed.  Follow-Up Instructions: No follow-ups on file.   Orders:  No orders of the defined types were placed in this encounter.  No orders of the defined types were placed in this encounter.     Procedures: No procedures performed   Clinical Data: No additional findings.   Subjective: Chief Complaint  Patient presents with   Left Foot - Follow-up    Doing good, using a sleeve when it begins to hurt. Have worn the boot a couple of times for about 24 hours and then it's ok.    HPI patient returns for follow-up of left foot distal Achilles tendinopathy with.  Tendinitis.  She also had mild tenosynovitis of the posterior tibial tendon and has had some pain over the posterior tibial tendon.  She has a small sleeve she is use when her symptoms been mild she has been out of the boot after wearing it for more than 6 weeks and states she is walking well does not have any pain today.  Review of Systems all systems updated unchanged from 02/06/2023 office visit.   Objective: Vital Signs: Ht 5\' 2"  (1.575 m)   BMI 33.84 kg/m   Physical Exam Constitutional:      Appearance: She is well-developed.  HENT:     Head: Normocephalic.     Right Ear: External ear normal.     Left Ear: External ear normal. There is no impacted cerumen.  Eyes:     Pupils: Pupils are equal, round, and reactive to light.  Neck:     Thyroid: No thyromegaly.     Trachea: No  tracheal deviation.  Cardiovascular:     Rate and Rhythm: Normal rate.  Pulmonary:     Effort: Pulmonary effort is normal.  Abdominal:     Palpations: Abdomen is soft.  Musculoskeletal:     Cervical back: No rigidity.  Skin:    General: Skin is warm and dry.  Neurological:     Mental Status: She is alert and oriented to person, place, and time.  Psychiatric:        Behavior: Behavior normal.     Ortho Exam no pain with resisted posterior tibial tendon function no tenderness over the posterior tibial tendon with palpation.  Trace tenderness at the distal Achilles insertion site in the calcaneus.  She is ambulating without limping.  Specialty Comments:  No specialty comments available.  Imaging: No results found.   PMFS History: Patient Active Problem List   Diagnosis Date Noted   Tendinitis of ankle 02/06/2023   Diabetes mellitus (HCC) 10/03/2022   Hyperlipidemia associated with type 2 diabetes mellitus (HCC) 09/01/2018   Depressed mood 09/01/2018   Low back pain 04/26/2016   Hearing problem of both ears 01/30/2016   Hypertension associated  with diabetes (HCC) 11/07/2015   BMI 35.0-35.9,adult 08/11/2015   Migraine 03/17/2015   Diabetic neuropathy (HCC) 03/17/2015   Constipation 04/05/2014   Microcytic anemia 04/05/2014   Recurrent obstructive pyelonephritis 08/14/2013   Obstructive uropathy 08/13/2013   Stone in kidney 08/13/2013   Diabetic gastroparesis (HCC) 07/23/2011   Hypothyroidism 08/24/2010   DM (diabetes mellitus), type 2, uncontrolled 08/24/2010   Past Medical History:  Diagnosis Date   Anemia 07/23/2011   Anxiety    Diabetes mellitus    Diabetic gastroparesis (HCC) 07/23/2011   Suspected   Headache(784.0)    Hearing difficulty    Hyperlipidemia    Hypothyroidism    Kidney stones    Microcytic anemia 07/23/2011   Thalassemia minor    ????? not really worked up per pt but daughter has it???    Family History  Problem Relation Age of Onset   Colon  polyps Mother    Rheum arthritis Sister    Breast cancer Maternal Aunt    Breast cancer Maternal Aunt    Colon cancer Maternal Grandmother        age 22   CAD Brother     Past Surgical History:  Procedure Laterality Date   COLONOSCOPY  2004   Dr. Sabino Gasser: small fissue and hemorrhoids   COLONOSCOPY  12/2011   Dr. Dulce Sellar: normal   CYSTOSCOPY W/ URETERAL STENT PLACEMENT Right 08/13/2013   Procedure: CYSTOSCOPY WITH RETROGRADE PYELOGRAM/URETERAL STENT PLACEMENT;  Surgeon: Ky Barban, MD;  Location: [facility];  Service: Urology;  Laterality: Right;   EXTRACORPOREAL SHOCK WAVE LITHOTRIPSY Right 08/19/2013   Procedure: EXTRACORPOREAL SHOCK WAVE LITHOTRIPSY (ESWL) RIGHT URETERAL CALCULUS;  Surgeon: Ky Barban, MD;  Location: [facility];  Service: Urology;  Laterality: Right;   WRIST SURGERY Right    Social History   Occupational History   Occupation: Training and development officer: [employer]   Occupation: Retired  Tobacco Use   Smoking status: Never   Smokeless tobacco: Never   Tobacco comments:    Never smoked  Vaping Use   Vaping status: Never Used  Substance and Sexual Activity   Alcohol use: No    Alcohol/week: 0.0 standard drinks of alcohol   Drug use: No   Sexual activity: Not Currently

## 2023-04-15 NOTE — Progress Notes (Unsigned)
Subjective: CC:DM PCP: Raliegh Ip, DO [id number] [name] is a 67 y.o. female presenting to clinic today for:  1. Type 2 Diabetes with hypertension, hyperlipidemia:  She reports compliance with her semaglutide 1 milligram weekly.  She reports no nausea, vomiting or abdominal pain.  Sometimes she does get some loose stool but this is usually only about 2 days after her injection.  She does not have issues otherwise.  No hematochezia or melena observed.  She is compliant with her Crestor and Cozaar.  She has been taking only about 6 to 8 units sliding scale insulin and down to 10 units of Lantus per day.  At some point she does want a go up to the 2 mg but for now she wants to stick with 1 mg and her next visit  Diabetes Health Maintenance Due  Topic Date Due   OPHTHALMOLOGY EXAM  04/16/2023 (Originally 03/07/2022)   FOOT EXAM  10/03/2023   HEMOGLOBIN A1C  10/15/2023    Last A1c:  Lab Results  Component Value Date   HGBA1C 6.8 (H) 04/16/2023    2.  Hypothyroidism Compliant with Synthroid.  No reports of tremor, heart palpitations or changes in bowel habits except for occasional diarrhea as above   ROS: Per HPI  Allergies  Allergen Reactions   Codeine Nausea Only and Other (See Comments)    NAUSEA AND CAUSES HER TO HAVE BAD DREAMS   Gabapentin     Confusion    Lisinopril     Coughing   Past Medical History:  Diagnosis Date   Anemia 07/23/2011   Anxiety    Diabetes mellitus    Diabetic gastroparesis (HCC) 07/23/2011   Suspected   Headache(784.0)    Hearing difficulty    Hyperlipidemia    Hypothyroidism    Kidney stones    Microcytic anemia 07/23/2011   Thalassemia minor    ????? not really worked up per pt but daughter has it???    Current Outpatient Medications:    aspirin 81 MG tablet, Take 81 mg by mouth daily., Disp: , Rfl:    Continuous Glucose Sensor (FREESTYLE LIBRE 3 SENSOR) MISC, CHANGE EVERY 14 DAYS, Disp: 6 each, Rfl: 0   docusate sodium  (COLACE) 100 MG capsule, Take 200 mg by mouth daily as needed. , Disp: , Rfl:    glucose blood (GNP EASY TOUCH GLUCOSE TEST) test strip, EVERY DAY, Disp: 100 each, Rfl: 11   insulin aspart (NOVOLOG FLEXPEN) 100 UNIT/ML FlexPen, Inject 10-17 Units into the skin 3 (three) times daily with meals., Disp: 45 mL, Rfl: PRN   insulin glargine (LANTUS) 100 UNIT/ML injection, INJECT 0.22-0.3MLS (22-30 UNITS) INTO THE SKIN AT BEDTIME (Patient taking differently: Inject 10 Units into the skin at bedtime.), Disp: 30 mL, Rfl: PRN   Insulin Pen Needle (PEN NEEDLES) 32G X 4 MM MISC, UAD E11.69, Disp: 100 each, Rfl: 3   Insulin Syringe-Needle U-100 (INSULIN SYRINGE .5CC/31GX5/16") 31G X 5/16" 0.5 ML MISC, UAD with Lantus E11.69, Disp: 100 each, Rfl: 3   levocetirizine (XYZAL) 5 MG tablet, Take 1 tablet (5 mg total) by mouth at bedtime as needed for allergies (itching/ rash)., Disp: 90 tablet, Rfl: 0   levothyroxine (SYNTHROID) 137 MCG tablet, TAKE ONE TABLET EACH MORNING BEFORE BREAKFAST, Disp: 90 tablet, Rfl: 3   losartan (COZAAR) 25 MG tablet, TAKE ONE (1) TABLET EACH DAY, Disp: 90 tablet, Rfl: 3   Multiple Vitamin (MULTIVITAMIN WITH MINERALS) TABS tablet, Take 1 tablet by mouth daily., Disp: ,  Rfl:    rosuvastatin (CRESTOR) 20 MG tablet, Take 1 tablet (20 mg total) by mouth daily., Disp: 90 tablet, Rfl: 3   Semaglutide, 1 MG/DOSE, 4 MG/3ML SOPN, Inject 1 mg as directed once a week., Disp: 9 mL, Rfl: 3   SUMAtriptan (IMITREX) 100 MG tablet, May repeat in 2 hours if headache persists or recurs., Disp: 9 tablet, Rfl: PRN   triamcinolone cream (KENALOG) 0.1 %, Apply 1 application. topically 2 (two) times daily. X7-10 days for rash, Disp: 80 g, Rfl: 0 Social History   Socioeconomic History   Marital status: Married    Spouse name: Susann Givens   Number of children: 1   Years of education: Not on file   Highest education level: Some college, no degree  Occupational History   Occupation: Training and development officer:  [employer]   Occupation: Retired  Tobacco Use   Smoking status: Never   Smokeless tobacco: Never   Tobacco comments:    Never smoked  Vaping Use   Vaping status: Never Used  Substance and Sexual Activity   Alcohol use: No    Alcohol/week: 0.0 standard drinks of alcohol   Drug use: No   Sexual activity: Not Currently  Other Topics Concern   Not on file  Social History Narrative   Lives home with husband - Daughter lives in [place] - she has a great grandchild also   Social Determinants of Health   Financial Resource Strain: Low Risk  (02/26/2023)   Overall Financial Resource Strain (CARDIA)    Difficulty of Paying Living Expenses: Not hard at all  Food Insecurity: No Food Insecurity (02/26/2023)   Hunger Vital Sign    Worried About Running Out of Food in the Last Year: Never true    Ran Out of Food in the Last Year: Never true  Transportation Needs: No Transportation Needs (02/26/2023)   PRAPARE - Administrator, Civil Service (Medical): No    Lack of Transportation (Non-Medical): No  Physical Activity: Inactive (02/26/2023)   Exercise Vital Sign    Days of Exercise per Week: 0 days    Minutes of Exercise per Session: 0 min  Stress: No Stress Concern Present (02/26/2023)   [employer] of Occupational Health - Occupational Stress Questionnaire    Feeling of Stress : Not at all  Social Connections: Socially Integrated (02/26/2023)   Social Connection and Isolation Panel [NHANES]    Frequency of Communication with Friends and Family: More than three times a week    Frequency of Social Gatherings with Friends and Family: More than three times a week    Attends Religious Services: More than 4 times per year    Active Member of [employer] or Organizations: Yes    Attends Engineer, structural: More than 4 times per year    Marital Status: Married  Catering manager Violence: Not At Risk (02/26/2023)   Humiliation, Afraid, Rape, and Kick questionnaire     Fear of Current or Ex-Partner: No    Emotionally Abused: No    Physically Abused: No    Sexually Abused: No   Family History  Problem Relation Age of Onset   Colon polyps Mother    Rheum arthritis Sister    Breast cancer Maternal Aunt    Breast cancer Maternal Aunt    Colon cancer Maternal Grandmother        age 61   CAD Brother     Objective: Office vital signs reviewed. BP  138/71   Pulse 97   Temp 98.4 F (36.9 C)   Ht 5\' 2"  (1.575 m)   Wt 191 lb 6.4 oz (86.8 kg)   SpO2 99%   BMI 35.01 kg/m   Physical Examination:  General: Awake, alert, well nourished, No acute distress HEENT: sclera white, MMM Cardio: regular rate and rhythm, S1S2 heard, no murmurs appreciated Pulm: clear to auscultation bilaterally, no wheezes, rhonchi or rales; normal work of breathing on room air GI: nondistended. Extremities: warm, well perfused, No edema, cyanosis or clubbing; +2 pulses bilaterally  Assessment/ Plan: 67 y.o. female   Type 2 diabetes mellitus with other specified complication, with long-term current use of insulin (HCC) - Plan: Microalbumin / creatinine urine ratio, Bayer DCA Hb A1c Waived  Hypertension associated with diabetes (HCC)  Diabetic polyneuropathy associated with type 2 diabetes mellitus (HCC)  Hyperlipidemia associated with type 2 diabetes mellitus (HCC)  Long-term current use of injectable noninsulin antidiabetic medication  Sugar at goal.  No need to change medications until she is ready.  Continue lifestyle modification, balanced diet.  We we will make a goal of getting her off of sliding scale insulin and Lantus at some point.  She is really close and I think this could be achieved by changing her to 2 mg next year if needed.  She will schedule a close follow-up visit and we will plan to repeat A1c and make that decision in at least 3 to 4 months.  Polyneuropathy seems to be stable without medications.  Her blood pressure is well-controlled and no need to  change any medications at this time.  She will continue statin as prescribed.  Not due for fasting lipid panel.  Raliegh Ip, DO [facility] [phone]

## 2023-04-16 ENCOUNTER — Ambulatory Visit (INDEPENDENT_AMBULATORY_CARE_PROVIDER_SITE_OTHER): Payer: PPO | Admitting: Family Medicine

## 2023-04-16 ENCOUNTER — Encounter: Payer: Self-pay | Admitting: Family Medicine

## 2023-04-16 VITALS — BP 138/71 | HR 97 | Temp 98.4°F | Ht 62.0 in | Wt 191.4 lb

## 2023-04-16 DIAGNOSIS — I152 Hypertension secondary to endocrine disorders: Secondary | ICD-10-CM | POA: Diagnosis not present

## 2023-04-16 DIAGNOSIS — E1159 Type 2 diabetes mellitus with other circulatory complications: Secondary | ICD-10-CM | POA: Diagnosis not present

## 2023-04-16 DIAGNOSIS — E1142 Type 2 diabetes mellitus with diabetic polyneuropathy: Secondary | ICD-10-CM | POA: Diagnosis not present

## 2023-04-16 DIAGNOSIS — E785 Hyperlipidemia, unspecified: Secondary | ICD-10-CM | POA: Diagnosis not present

## 2023-04-16 DIAGNOSIS — Z7985 Long-term (current) use of injectable non-insulin antidiabetic drugs: Secondary | ICD-10-CM | POA: Diagnosis not present

## 2023-04-16 DIAGNOSIS — Z794 Long term (current) use of insulin: Secondary | ICD-10-CM

## 2023-04-16 DIAGNOSIS — E1169 Type 2 diabetes mellitus with other specified complication: Secondary | ICD-10-CM | POA: Diagnosis not present

## 2023-04-16 LAB — BAYER DCA HB A1C WAIVED: HB A1C (BAYER DCA - WAIVED): 6.8 % — ABNORMAL HIGH (ref 4.8–5.6)

## 2023-04-17 LAB — MICROALBUMIN / CREATININE URINE RATIO
Creatinine, Urine: 101.3 mg/dL
Microalb/Creat Ratio: 4 mg/g{creat} (ref 0–29)
Microalbumin, Urine: 3.8 ug/mL

## 2023-04-21 ENCOUNTER — Other Ambulatory Visit: Payer: Self-pay | Admitting: Pharmacist

## 2023-04-21 NOTE — Progress Notes (Signed)
Pharmacy Quality Measure Review  This patient is appearing on a report for being at risk of failing the adherence measure for cholesterol (statin) medications this calendar year.   Medication: rosuvastatin 20 mg Last fill date: 6/24 for 90 day supply  Outreached patient to discuss adherence to rosuvastatin and losartan (last fill date 7/5 for 90 day supply). Patient reported she still has supply of both medications at home. Reports she does occasionally miss doses of medications. Currently utilizes a pill box to help organize medications. Discussed placing pill box in a place that she will see that is a part of her daily routine to help her remember to take medications every day. She denied any issues obtaining medications from the pharmacy. She has refills on file and will reach out to pharmacy when she needs medications filled.  Jarrett Ables, PharmD PGY-1 Pharmacy Resident

## 2023-05-02 ENCOUNTER — Other Ambulatory Visit: Payer: Self-pay | Admitting: Family Medicine

## 2023-05-02 DIAGNOSIS — E1169 Type 2 diabetes mellitus with other specified complication: Secondary | ICD-10-CM

## 2023-05-02 DIAGNOSIS — E039 Hypothyroidism, unspecified: Secondary | ICD-10-CM

## 2023-05-06 ENCOUNTER — Ambulatory Visit: Payer: PPO | Admitting: Nurse Practitioner

## 2023-05-06 ENCOUNTER — Encounter: Payer: Self-pay | Admitting: Nurse Practitioner

## 2023-05-06 VITALS — BP 155/86 | HR 68 | Temp 97.4°F | Resp 20 | Ht 62.0 in | Wt 194.0 lb

## 2023-05-06 DIAGNOSIS — M546 Pain in thoracic spine: Secondary | ICD-10-CM | POA: Diagnosis not present

## 2023-05-06 LAB — URINALYSIS
Bilirubin, UA: NEGATIVE
Glucose, UA: NEGATIVE
Ketones, UA: NEGATIVE
Nitrite, UA: NEGATIVE
Protein,UA: NEGATIVE
RBC, UA: NEGATIVE
Specific Gravity, UA: 1.025 (ref 1.005–1.030)
Urobilinogen, Ur: 0.2 mg/dL (ref 0.2–1.0)
pH, UA: 6 (ref 5.0–7.5)

## 2023-05-06 MED ORDER — KETOROLAC TROMETHAMINE 60 MG/2ML IM SOLN
60.0000 mg | Freq: Once | INTRAMUSCULAR | Status: DC
Start: 2023-05-06 — End: 2023-05-06

## 2023-05-06 MED ORDER — KETOROLAC TROMETHAMINE 60 MG/2ML IM SOLN
60.0000 mg | Freq: Once | INTRAMUSCULAR | Status: AC
Start: 2023-05-06 — End: 2023-05-06
  Administered 2023-05-06: 60 mg via INTRAMUSCULAR

## 2023-05-06 MED ORDER — METHYLPREDNISOLONE ACETATE 80 MG/ML IJ SUSP
80.0000 mg | Freq: Once | INTRAMUSCULAR | Status: AC
Start: 2023-05-06 — End: 2023-05-06
  Administered 2023-05-06: 80 mg via INTRAMUSCULAR

## 2023-05-06 NOTE — Addendum Note (Signed)
Addended by: Bennie Pierini on: 05/06/2023 09:37 AM   Modules accepted: Orders

## 2023-05-06 NOTE — Progress Notes (Signed)
Subjective:    Patient ID: Melissa Schneider, female    DOB: January 09, 1956, 67 y.o.   MRN: [id number]   Chief Complaint: Back Pain (Mid back pain  Raking leaves last week)   Back Pain This is a new problem. The current episode started in the past 7 days. The problem occurs intermittently. The problem has been waxing and waning since onset. The pain is present in the lumbar spine. The pain does not radiate. The pain is at a severity of 8/10. The pain is moderate. The pain is The same all the time. The symptoms are aggravated by lying down, position and twisting. Pertinent negatives include no abdominal pain, dysuria or headaches. She has tried analgesics and NSAIDs for the symptoms. The treatment provided mild relief.    Patient Active Problem List   Diagnosis Date Noted   Tendinitis of ankle 02/06/2023   Diabetes mellitus (HCC) 10/03/2022   Hyperlipidemia associated with type 2 diabetes mellitus (HCC) 09/01/2018   Depressed mood 09/01/2018   Low back pain 04/26/2016   Hearing problem of both ears 01/30/2016   Hypertension associated with diabetes (HCC) 11/07/2015   BMI 35.0-35.9,adult 08/11/2015   Migraine 03/17/2015   Diabetic neuropathy (HCC) 03/17/2015   Constipation 04/05/2014   Microcytic anemia 04/05/2014   Recurrent obstructive pyelonephritis 08/14/2013   Obstructive uropathy 08/13/2013   Stone in kidney 08/13/2013   Diabetic gastroparesis (HCC) 07/23/2011   Hypothyroidism 08/24/2010   DM (diabetes mellitus), type 2, uncontrolled 08/24/2010       Review of Systems  Gastrointestinal:  Negative for abdominal pain.  Genitourinary:  Negative for dysuria.  Musculoskeletal:  Positive for back pain.  Neurological:  Negative for headaches.       Objective:   Physical Exam Constitutional:      Appearance: Normal appearance.  Cardiovascular:     Rate and Rhythm: Normal rate and regular rhythm.     Heart sounds: Normal heart sounds.  Pulmonary:     Effort: Pulmonary  effort is normal.     Breath sounds: Normal breath sounds.  Abdominal:     Tenderness: There is right CVA tenderness.  Musculoskeletal:     Comments: Decrease RIM of thoracic spine with pain on flexion  Skin:    General: Skin is warm.  Neurological:     General: No focal deficit present.     Mental Status: She is alert and oriented to person, place, and time.  Psychiatric:        Mood and Affect: Mood normal.        Behavior: Behavior normal.    BP (!) 155/86   Pulse 68   Temp (!) 97.4 F (36.3 C) (Temporal)   Resp 20   Ht 5\' 2"  (1.575 m)   Wt 194 lb (88 kg)   SpO2 97%   BMI 35.48 kg/m    Urine clear     Assessment & Plan:   Melissa Schneider in today with chief complaint of Back Pain (Mid back pain  Raking leaves last week)   1. Acute right-sided thoracic back pain Moist heat  Rest RTO prn - Urinalysis - methylPREDNISolone acetate (DEPO-MEDROL) injection 80 mg - ketorolac (TORADOL) injection 60 mg    The above assessment and management plan was discussed with the patient. The patient verbalized understanding of and has agreed to the management plan. Patient is aware to call the clinic if symptoms persist or worsen. Patient is aware when to return to the clinic for a follow-up  visit. Patient educated on when it is appropriate to go to the emergency department.   Mary-Margaret Daphine Deutscher, FNP

## 2023-05-06 NOTE — Patient Instructions (Signed)
Acute Back Pain, Adult Acute back pain is sudden and usually short-lived. It is often caused by an injury to the muscles and tissues in the back. The injury may result from: A muscle, tendon, or ligament getting overstretched or torn. Ligaments are tissues that connect bones to each other. Lifting something improperly can cause a back strain. Wear and tear (degeneration) of the spinal disks. Spinal disks are circular tissue that provide cushioning between the bones of the spine (vertebrae). Twisting motions, such as while playing sports or doing yard work. A hit to the back. Arthritis. You may have a physical exam, lab tests, and imaging tests to find the cause of your pain. Acute back pain usually goes away with rest and home care. Follow these instructions at home: Managing pain, stiffness, and swelling Take over-the-counter and prescription medicines only as told by your health care provider. Treatment may include medicines for pain and inflammation that are taken by mouth or applied to the skin, or muscle relaxants. Your health care provider may recommend applying ice during the first 24-48 hours after your pain starts. To do this: Put ice in a plastic bag. Place a towel between your skin and the bag. Leave the ice on for 20 minutes, 2-3 times a day. Remove the ice if your skin turns bright red. This is very important. If you cannot feel pain, heat, or cold, you have a greater risk of damage to the area. If directed, apply heat to the affected area as often as told by your health care provider. Use the heat source that your health care provider recommends, such as a moist heat pack or a heating pad. Place a towel between your skin and the heat source. Leave the heat on for 20-30 minutes. Remove the heat if your skin turns bright red. This is especially important if you are unable to feel pain, heat, or cold. You have a greater risk of getting burned. Activity  Do not stay in bed. Staying in  bed for more than 1-2 days can delay your recovery. Sit up and stand up straight. Avoid leaning forward when you sit or hunching over when you stand. If you work at a desk, sit close to it so you do not need to lean over. Keep your chin tucked in. Keep your neck drawn back, and keep your elbows bent at a 90-degree angle (right angle). Sit high and close to the steering wheel when you drive. Add lower back (lumbar) support to your car seat, if needed. Take short walks on even surfaces as soon as you are able. Try to increase the length of time you walk each day. Do not sit, drive, or stand in one place for more than 30 minutes at a time. Sitting or standing for long periods of time can put stress on your back. Do not drive or use heavy machinery while taking prescription pain medicine. Use proper lifting techniques. When you bend and lift, use positions that put less stress on your back: Bend your knees. Keep the load close to your body. Avoid twisting. Exercise regularly as told by your health care provider. Exercising helps your back heal faster and helps prevent back injuries by keeping muscles strong and flexible. Work with a physical therapist to make a safe exercise program, as recommended by your health care provider. Do any exercises as told by your physical therapist. Lifestyle Maintain a healthy weight. Extra weight puts stress on your back and makes it difficult to have good   posture. Avoid activities or situations that make you feel anxious or stressed. Stress and anxiety increase muscle tension and can make back pain worse. Learn ways to manage anxiety and stress, such as through exercise. General instructions Sleep on a firm mattress in a comfortable position. Try lying on your side with your knees slightly bent. If you lie on your back, put a pillow under your knees. Keep your head and neck in a straight line with your spine (neutral position) when using electronic equipment like  smartphones or pads. To do this: Raise your smartphone or pad to look at it instead of bending your head or neck to look down. Put the smartphone or pad at the level of your face while looking at the screen. Follow your treatment plan as told by your health care provider. This may include: Cognitive or behavioral therapy. Acupuncture or massage therapy. Meditation or yoga. Contact a health care provider if: You have pain that is not relieved with rest or medicine. You have increasing pain going down into your legs or buttocks. Your pain does not improve after 2 weeks. You have pain at night. You lose weight without trying. You have a fever or chills. You develop nausea or vomiting. You develop abdominal pain. Get help right away if: You develop new bowel or bladder control problems. You have unusual weakness or numbness in your arms or legs. You feel faint. These symptoms may represent a serious problem that is an emergency. Do not wait to see if the symptoms will go away. Get medical help right away. Call your local emergency services (911 in the [place] Do not drive yourself to the hospital. Summary Acute back pain is sudden and usually short-lived. Use proper lifting techniques. When you bend and lift, use positions that put less stress on your back. Take over-the-counter and prescription medicines only as told by your health care provider, and apply heat or ice as told. This information is not intended to replace advice given to you by your health care provider. Make sure you discuss any questions you have with your health care provider. Document Revised: 09/23/2020 Document Reviewed: 09/23/2020 Elsevier Patient Education  2024 [employer]  

## 2023-05-08 ENCOUNTER — Ambulatory Visit: Payer: PPO | Admitting: Family Medicine

## 2023-05-08 ENCOUNTER — Encounter: Payer: Self-pay | Admitting: Family Medicine

## 2023-05-08 VITALS — BP 128/82 | HR 72 | Ht 62.0 in | Wt 193.0 lb

## 2023-05-08 DIAGNOSIS — S29012A Strain of muscle and tendon of back wall of thorax, initial encounter: Secondary | ICD-10-CM

## 2023-05-08 MED ORDER — CYCLOBENZAPRINE HCL 10 MG PO TABS
10.0000 mg | ORAL_TABLET | Freq: Three times a day (TID) | ORAL | 0 refills | Status: DC | PRN
Start: 2023-05-08 — End: 2024-04-22

## 2023-05-08 MED ORDER — PREDNISONE 20 MG PO TABS
ORAL_TABLET | ORAL | 0 refills | Status: DC
Start: 2023-05-08 — End: 2023-10-03

## 2023-05-08 NOTE — Progress Notes (Signed)
BP 128/82   Pulse 72   Ht 5\' 2"  (1.575 m)   Wt 193 lb (87.5 kg)   SpO2 97%   BMI 35.30 kg/m    Subjective:   Patient ID: Melissa Schneider, female    DOB: 1955-11-30, 67 y.o.   MRN: [id number]  HPI: Melissa Schneider is a 67 y.o. female presenting on 05/08/2023 for Back Pain (Radiates around to right breast. )   HPI Back pain Patient comes in for back pain on her right mid back under her shoulder blade and she says it has been bothering her for about 5 or 6 days.  She says last week she was blowing leaves with her blower and that she started having issues the next day.  She does not know if just from the weight of it and stretching her muscles that she caused issues.  She was seen 3 days ago and given a steroid injection and a Toradol injection and she does say they helped but did not alleviate completely the issue.  She still is having the pain.  She does have a freestyle libre so keep another sugars well.  She denies any numbness or weakness.  Relevant past medical, surgical, family and social history reviewed and updated as indicated. Interim medical history since our last visit reviewed. Allergies and medications reviewed and updated.  Review of Systems  Constitutional:  Negative for chills and fever.  Eyes:  Negative for redness and visual disturbance.  Respiratory:  Negative for chest tightness and shortness of breath.   Cardiovascular:  Negative for chest pain and leg swelling.  Musculoskeletal:  Positive for arthralgias, back pain and myalgias. Negative for gait problem.  Skin:  Negative for rash.  Neurological:  Negative for dizziness, light-headedness and headaches.  Psychiatric/Behavioral:  Negative for agitation and behavioral problems.   All other systems reviewed and are negative.   Per HPI unless specifically indicated above   Allergies as of 05/08/2023       Reactions   Codeine Nausea Only, Other (See Comments)   NAUSEA AND CAUSES HER TO HAVE BAD DREAMS    Gabapentin    Confusion   Lisinopril    Coughing        Medication List        Accurate as of May 08, 2023  9:57 AM. If you have any questions, ask your nurse or doctor.          aspirin 81 MG tablet Take 81 mg by mouth daily.   cyclobenzaprine 10 MG tablet Commonly known as: FLEXERIL Take 1 tablet (10 mg total) by mouth 3 (three) times daily as needed for muscle spasms. Started by: Elige Radon Jaylani Mcguinn   docusate sodium 100 MG capsule Commonly known as: COLACE Take 200 mg by mouth daily as needed.   FreeStyle Libre 3 Sensor Misc CHANGE EVERY 14 DAYS   GNP Easy Touch Glucose Test test strip Generic drug: glucose blood EVERY DAY   insulin glargine 100 UNIT/ML injection Commonly known as: Lantus INJECT 0.22-0.3MLS (22-30 UNITS) INTO THE SKIN AT BEDTIME What changed:  how much to take how to take this when to take this additional instructions   INSULIN SYRINGE .5CC/31GX5/16" 31G X 5/16" 0.5 ML Misc UAD with Lantus E11.69   levocetirizine 5 MG tablet Commonly known as: XYZAL Take 1 tablet (5 mg total) by mouth at bedtime as needed for allergies (itching/ rash).   levothyroxine 137 MCG tablet Commonly known as: SYNTHROID TAKE ONE TABLET EACH  MORNING BEFORE BREAKFAST   losartan 25 MG tablet Commonly known as: COZAAR TAKE ONE (1) TABLET EACH DAY   multivitamin with minerals Tabs tablet Take 1 tablet by mouth daily.   NovoLOG FlexPen 100 UNIT/ML FlexPen Generic drug: insulin aspart Inject 10-17 Units into the skin 3 (three) times daily with meals.   Pen Needles 32G X 4 MM Misc UAD E11.69   predniSONE 20 MG tablet Commonly known as: DELTASONE 2 po at same time daily for 5 days Started by: Elige Radon Jerrol Helmers   rosuvastatin 20 MG tablet Commonly known as: Crestor Take 1 tablet (20 mg total) by mouth daily.   Semaglutide (1 MG/DOSE) 4 MG/3ML Sopn Inject 1 mg as directed once a week.   SUMAtriptan 100 MG tablet Commonly known as: IMITREX May  repeat in 2 hours if headache persists or recurs.   triamcinolone cream 0.1 % Commonly known as: KENALOG Apply 1 application. topically 2 (two) times daily. X7-10 days for rash         Objective:   BP 128/82   Pulse 72   Ht 5\' 2"  (1.575 m)   Wt 193 lb (87.5 kg)   SpO2 97%   BMI 35.30 kg/m   Wt Readings from Last 3 Encounters:  05/08/23 193 lb (87.5 kg)  05/06/23 194 lb (88 kg)  04/16/23 191 lb 6.4 oz (86.8 kg)    Physical Exam Vitals and nursing note reviewed.  Constitutional:      Appearance: Normal appearance.  Musculoskeletal:       Arms:  Neurological:     Mental Status: She is alert.       Assessment & Plan:   Problem List Items Addressed This Visit   None Visit Diagnoses     Strain of thoracic back region    -  Primary   Relevant Medications   predniSONE (DELTASONE) 20 MG tablet   cyclobenzaprine (FLEXERIL) 10 MG tablet   Other Relevant Orders   Ambulatory referral to Physical Therapy     Likely muscular pain in origin, will give her a short course of prednisone and some muscle relaxers and do physical therapy referral so they can work on stretching it out for her.  Follow up plan: Return if symptoms worsen or fail to improve.  Counseling provided for all of the vaccine components Orders Placed This Encounter  Procedures   Ambulatory referral to Physical Therapy    Arville Care, MD [facility] 05/08/2023, 9:57 AM

## 2023-05-13 ENCOUNTER — Encounter: Payer: Self-pay | Admitting: Nurse Practitioner

## 2023-05-13 ENCOUNTER — Ambulatory Visit (INDEPENDENT_AMBULATORY_CARE_PROVIDER_SITE_OTHER): Payer: PPO | Admitting: Nurse Practitioner

## 2023-05-13 VITALS — BP 127/65 | HR 85 | Temp 97.1°F | Ht 62.0 in | Wt 192.4 lb

## 2023-05-13 DIAGNOSIS — B027 Disseminated zoster: Secondary | ICD-10-CM | POA: Insufficient documentation

## 2023-05-13 MED ORDER — ALOE VERA 72 % EX CREA: 1.0000 | TOPICAL_CREAM | Freq: Two times a day (BID) | CUTANEOUS | 0 refills | Status: AC | PRN

## 2023-05-13 MED ORDER — ACYCLOVIR 800 MG PO TABS
800.0000 mg | ORAL_TABLET | Freq: Every day | ORAL | 0 refills | Status: AC
Start: 2023-05-13 — End: 2023-05-20

## 2023-05-13 NOTE — Progress Notes (Signed)
Established Patient Office Visit  Subjective   Patient ID: Melissa Schneider, female    DOB: 1955-07-21  Age: 67 y.o. MRN: [id number]  Chief Complaint  Patient presents with   rash    Has had rash since Friday night thinks it shingles. Rash is just on back and spreads to right breast     HPI This a new new problem DER FEI is a 67 yrs old female seen last week twice. 10/21 was seen for  back pain and treated with  prednisone and Toradol. She was again on 05/08/23 fro back pain  and was treated with Flexeril and a short course of prednisone. She reports that develop a painful rash on her back day history of a painful rash on the since Saturday . PMH: generally healthy. Has not had herpes zoster in the past.   Patient Active Problem List   Diagnosis Date Noted   Disseminated herpes zoster 05/13/2023   Tendinitis of ankle 02/06/2023   Diabetes mellitus (HCC) 10/03/2022   Hyperlipidemia associated with type 2 diabetes mellitus (HCC) 09/01/2018   Depressed mood 09/01/2018   Low back pain 04/26/2016   Hearing problem of both ears 01/30/2016   Hypertension associated with diabetes (HCC) 11/07/2015   BMI 35.0-35.9,adult 08/11/2015   Migraine 03/17/2015   Diabetic neuropathy (HCC) 03/17/2015   Constipation 04/05/2014   Microcytic anemia 04/05/2014   Recurrent obstructive pyelonephritis 08/14/2013   Obstructive uropathy 08/13/2013   Stone in kidney 08/13/2013   Diabetic gastroparesis (HCC) 07/23/2011   Hypothyroidism 08/24/2010   DM (diabetes mellitus), type 2, uncontrolled 08/24/2010   Past Medical History:  Diagnosis Date   Anemia 07/23/2011   Anxiety    Diabetes mellitus    Diabetic gastroparesis (HCC) 07/23/2011   Suspected   Headache(784.0)    Hearing difficulty    Hyperlipidemia    Hypothyroidism    Kidney stones    Microcytic anemia 07/23/2011   Thalassemia minor    ????? not really worked up per pt but daughter has it???   Past Surgical History:  Procedure  Laterality Date   COLONOSCOPY  2004   Dr. Sabino Gasser: small fissue and hemorrhoids   COLONOSCOPY  12/2011   Dr. Dulce Sellar: normal   CYSTOSCOPY W/ URETERAL STENT PLACEMENT Right 08/13/2013   Procedure: CYSTOSCOPY WITH RETROGRADE PYELOGRAM/URETERAL STENT PLACEMENT;  Surgeon: Ky Barban, MD;  Location: [facility];  Service: Urology;  Laterality: Right;   EXTRACORPOREAL SHOCK WAVE LITHOTRIPSY Right 08/19/2013   Procedure: EXTRACORPOREAL SHOCK WAVE LITHOTRIPSY (ESWL) RIGHT URETERAL CALCULUS;  Surgeon: Ky Barban, MD;  Location: [facility];  Service: Urology;  Laterality: Right;   WRIST SURGERY Right    Social History   Tobacco Use   Smoking status: Never   Smokeless tobacco: Never   Tobacco comments:    Never smoked  Vaping Use   Vaping status: Never Used  Substance Use Topics   Alcohol use: No    Alcohol/week: 0.0 standard drinks of alcohol   Drug use: No   Social History   Socioeconomic History   Marital status: Married    Spouse name: Susann Givens   Number of children: 1   Years of education: Not on file   Highest education level: Some college, no degree  Occupational History   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   Occupation: Retired  Tobacco Use   Smoking status: Never   Smokeless tobacco: Never   Tobacco comments:    Never smoked  Vaping Use   Vaping status: Never Used  Substance and Sexual Activity   Alcohol use: No    Alcohol/week: 0.0 standard drinks of alcohol   Drug use: No   Sexual activity: Not Currently  Other Topics Concern   Not on file  Social History Narrative   Lives home with husband - Daughter lives in [place] - she has a great grandchild also   Social Determinants of Health   Financial Resource Strain: Low Risk  (02/26/2023)   Overall Financial Resource Strain (CARDIA)    Difficulty of Paying Living Expenses: Not hard at all  Food Insecurity: No Food Insecurity (02/26/2023)   Hunger Vital Sign    Worried About Running  Out of Food in the Last Year: Never true    Ran Out of Food in the Last Year: Never true  Transportation Needs: No Transportation Needs (02/26/2023)   PRAPARE - Administrator, Civil Service (Medical): No    Lack of Transportation (Non-Medical): No  Physical Activity: Inactive (02/26/2023)   Exercise Vital Sign    Days of Exercise per Week: 0 days    Minutes of Exercise per Session: 0 min  Stress: No Stress Concern Present (02/26/2023)   [employer] of Occupational Health - Occupational Stress Questionnaire    Feeling of Stress : Not at all  Social Connections: Socially Integrated (02/26/2023)   Social Connection and Isolation Panel [NHANES]    Frequency of Communication with Friends and Family: More than three times a week    Frequency of Social Gatherings with Friends and Family: More than three times a week    Attends Religious Services: More than 4 times per year    Active Member of [employer] or Organizations: Yes    Attends Engineer, structural: More than 4 times per year    Marital Status: Married  Catering manager Violence: Not At Risk (02/26/2023)   Humiliation, Afraid, Rape, and Kick questionnaire    Fear of Current or Ex-Partner: No    Emotionally Abused: No    Physically Abused: No    Sexually Abused: No   Family Status  Relation Name Status   Mother  Deceased   Father  Deceased   Sister  Alive   Sister  Alive   Mat Aunt  (Not Specified)   Mat Aunt  Alive   MGM  (Not Specified)   Brother  Alive  No partnership data on file   Family History  Problem Relation Age of Onset   Colon polyps Mother    Rheum arthritis Sister    Breast cancer Maternal Aunt    Breast cancer Maternal Aunt    Colon cancer Maternal Grandmother        age 56   CAD Brother    Allergies  Allergen Reactions   Codeine Nausea Only and Other (See Comments)    NAUSEA AND CAUSES HER TO HAVE BAD DREAMS   Gabapentin     Confusion    Lisinopril     Coughing       ROS Negative unless indicated in HPI   Objective:     BP 127/65   Pulse 85   Temp (!) 97.1 F (36.2 C) (Temporal)   Ht 5\' 2"  (1.575 m)   Wt 192 lb 6.4 oz (87.3 kg)   SpO2 94%   BMI 35.19 kg/m  BP Readings from Last 3 Encounters:  05/13/23 127/65  05/08/23 128/82  05/06/23 (!) 155/86   Wt Readings from  Last 3 Encounters:  05/13/23 192 lb 6.4 oz (87.3 kg)  05/08/23 193 lb (87.5 kg)  05/06/23 194 lb (88 kg)      Physical Exam  Vital signs are normal, she appears well. Typical zoster lesions noted; vesicles on erythematous bases in clusters on the Thoracic dermatomal pattern.  No results found for any visits on 05/13/23.  Last CBC Lab Results  Component Value Date   WBC 7.3 01/07/2023   HGB 12.8 01/07/2023   HCT 42.6 01/07/2023   MCV 70 (L) 01/07/2023   MCH 21.0 (L) 01/07/2023   RDW 14.9 01/07/2023   PLT 306 01/07/2023   Last metabolic panel Lab Results  Component Value Date   GLUCOSE 137 (H) 01/07/2023   NA 138 01/07/2023   K 4.3 01/07/2023   CL 103 01/07/2023   CO2 19 (L) 01/07/2023   BUN 18 01/07/2023   CREATININE 0.81 01/07/2023   EGFR 80 01/07/2023   CALCIUM 9.9 01/07/2023   PHOS 2.3 08/13/2013   PROT 7.2 01/07/2023   ALBUMIN 4.2 01/07/2023   LABGLOB 3.0 01/07/2023   AGRATIO 1.5 10/03/2022   BILITOT <0.2 01/07/2023   ALKPHOS 146 (H) 01/07/2023   AST 17 01/07/2023   ALT 15 01/07/2023   ANIONGAP 9 09/13/2015   Last lipids Lab Results  Component Value Date   CHOL 168 01/07/2023   HDL 81 01/07/2023   LDLCALC 75 01/07/2023   LDLDIRECT 130 (H) 10/03/2022   TRIG 60 01/07/2023   CHOLHDL 2.1 01/07/2023   Last hemoglobin A1c Lab Results  Component Value Date   HGBA1C 6.8 (H) 04/16/2023   Last thyroid functions Lab Results  Component Value Date   TSH 1.110 01/07/2023   T4TOTAL 9.1 01/22/2020        Assessment & Plan:  Disseminated herpes zoster -     Acyclovir; Take 1 tablet (800 mg total) by mouth 5 (five) times daily for 7 days.   Dispense: 35 tablet; Refill: 0 -     Aloe Vera; Apply 1 Application topically 2 (two) times daily as needed.  Dispense: 114 g; Refill: 0  Graclyn is 67 yrs old caucasian female, no acute distress Herpes Zoster (shingles) The nature of herpes zoster is explained carefully. Lesions should be compressed/soaked with saline, topical antibiotic ointment to any infected lesions; Aloe Vera cream may help minor local pain. Intervention with antiviral agents is extremely helpful early in the course of the disease, less helpful after 2-3 days of symptoms. Postherpetic neuralgia is explained; this may occur especially in the elderly despite every attempt at prevention.  Prescription for acyclovir (Zovirax), which may shorten the course of acute symptoms and reduce the incidence of later neuralgia. The patient understands these issues, and will call as needed for further care.  Continue healthy lifestyle choices, including diet (rich in fruits, vegetables, and lean proteins, and low in salt and simple carbohydrates) and exercise (at least 30 minutes of moderate physical activity daily).     The above assessment and management plan was discussed with the patient. The patient verbalized understanding of and has agreed to the management plan. Patient is aware to call the clinic if they develop any new symptoms or if symptoms persist or worsen. Patient is aware when to return to the clinic for a follow-up visit. Patient educated on when it is appropriate to go to the emergency department.  Return if symptoms worsen or fail to improve.    Arrie Aran Santa Lighter, [place] [facility] [phone]  [address] [place], [place] [address] [phone]

## 2023-05-20 DIAGNOSIS — H25813 Combined forms of age-related cataract, bilateral: Secondary | ICD-10-CM | POA: Diagnosis not present

## 2023-05-20 DIAGNOSIS — Z794 Long term (current) use of insulin: Secondary | ICD-10-CM | POA: Diagnosis not present

## 2023-05-20 DIAGNOSIS — E109 Type 1 diabetes mellitus without complications: Secondary | ICD-10-CM | POA: Diagnosis not present

## 2023-05-20 LAB — HM DIABETES EYE EXAM

## 2023-05-21 ENCOUNTER — Ambulatory Visit: Payer: PPO | Admitting: Physical Therapy

## 2023-05-23 ENCOUNTER — Encounter: Payer: Self-pay | Admitting: Family Medicine

## 2023-05-23 ENCOUNTER — Other Ambulatory Visit: Payer: PPO | Admitting: *Deleted

## 2023-05-24 ENCOUNTER — Other Ambulatory Visit: Payer: Self-pay

## 2023-05-24 NOTE — Patient Instructions (Signed)
Visit Information  Thank you for taking time to visit with me today. Please don't hesitate to contact me if I can be of assistance to you before our next scheduled telephone appointment.  Following are the goals we discussed today:   Goals Addressed             This Visit's Progress    RNCM Care Management  (HYPERTENSION) EXPECTED OUTCOME: MONITOR, SELF-MANAGE AND REDUCE SYMPTOMS OF HYPERTENSION       Current Barriers:  Knowledge Deficits related to Hypertension management Care Coordination needs related to pharmacy, medication in a patient with Hypertension Chronic Disease Management support and education needs related to Hypertension No Advanced Directives in place- pt reports she has documents on hand to complete Patient reports she lives with spouse, is independent with all aspects of her care, drives, volunteers at [address], walks daily for 1.5-3 miles Patient reports she checks blood pressure once per week and states "readings are good" Patient does still have occasional migrane and reports Imitrex is helping her manage BP Readings from Last 3 Encounters:  05/13/23 127/65  05/08/23 128/82  05/06/23 (!) 155/86     Planned Interventions: Evaluation of current treatment plan related to hypertension self management and patient's adherence to plan as established by provider. Blood pressures are stable. Denies any new concerns with blood pressures. Knows to call for changes. Has a blood pressure cuff and can check when needed. ;   Reviewed medications with patient and discussed importance of compliance;  Counseled on the importance of exercise goals with target of 150 minutes per week Advised patient, providing education and rationale, to monitor blood pressure daily and record, calling PCP for findings outside established parameters;  Reviewed low sodium diet  Symptom Management: Take medications as prescribed   Attend all scheduled provider appointments Call pharmacy for  medication refills 3-7 days in advance of running out of medications Attend church or other social activities Perform all self care activities independently  Perform IADL's (shopping, preparing meals, housekeeping, managing finances) independently Call provider office for new concerns or questions  check blood pressure weekly choose a place to take my blood pressure (home, clinic or office, retail store) write blood pressure results in a log or diary keep a blood pressure log take blood pressure log to all doctor appointments keep all doctor appointments take medications for blood pressure exactly as prescribed report new symptoms to your doctor eat more whole grains, fruits and vegetables, lean meats and healthy fats Continue walking daily - keep up the good work! Follow low sodium diet Read food labels for sodium content Limit/ avoid fast food  Follow Up Plan: Telephone follow up appointment with care management team member scheduled for:   07-26-2023 at 1 pm       RNCM Care Mangement:  (DIABETES) EXPECTED OUTCOME: MONITOR, SELF-MANAGE AND REDUCE SYMPTOMS OF DIABETES       Current Barriers:  Knowledge Deficits related to Diabetes management Chronic Disease Management support and education needs related to Diabetes, ciet No Advanced Directives in place- pt has documents on hand Patient reports CBG is monitored with Freestyle Libre and she checks readings randomly against her glucometer to make sure reading correctly. Patient reports FBS ranges in 100-110 range, random ranges 160-200, Patient verbalizes understanding of correct treatment for hypoglycemia and stays in touch with primary care provider about blood sugar readings, plan of care, and medication adjustments, pt feels CBG readings are much improved PharmD has been working with pt  Lab Results  Component Value Date   HGBA1C 6.8 (H) 04/16/2023    Planned Interventions: Provided education to patient about basic DM disease  process. Review due to the patient having a diagnosis of shingles and had some elevations due to her receiving a steroid shot. The patient states she had readings as high as 300 but they have come down to normal now. ; Reviewed medications with patient and discussed importance of medication adherence. Patient is compliant with medications;        Counseled on importance of regular laboratory monitoring as prescribed. Has labs on a regular basis, labs are up to date;        Discussed plans with patient for ongoing care management follow up and provided patient with direct contact information for care management team;      Advised patient, providing education and rationale, to check cbg per CGM  and record. Has a freestyle libre and keeps good check on her blood sugar readings.        call provider for findings outside established parameters;       Review of patient status, including review of consultants reports, relevant laboratory and other test results, and medications completed;       Reinforced carbohydrate modified diet and food choices Had eye exam in October and has a script for new glasses. She has cataracts but wants to wait to have surgery for removal due to some health concerns of her sister and her helping her.  She is thankful that she is able to do her activities despite the pain she is having from recent shingles. Review of things to help alleviate shingles pain. Education provided. Will attach information in the chart for the patient. 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 She is active with her family and friends.   Symptom Management: Take medications as prescribed   Attend all scheduled provider appointments Call pharmacy for medication refills 3-7 days in advance of running out of medications Attend church or other social activities Perform all self care activities independently  Perform IADL's (shopping, preparing  meals, housekeeping, managing finances) independently Call provider office for new concerns or questions  check blood sugar at prescribed times: per [facility]  check feet daily for cuts, sores or redness enter blood sugar readings and medication or insulin into daily log take the blood sugar log to all doctor visits take the blood sugar meter to all doctor visits trim toenails straight across fill half of plate with vegetables limit fast food meals to no more than 1 per week manage portion size prepare main meal at home 3 to 5 days each week read food labels for fat, fiber, carbohydrates and portion size set a realistic goal Continue walking daily- keep up the good work! Follow RULE OF 15 for low blood sugar management:  How to treat low blood sugars (Blood sugar less than 70 mg/dl  Please follow the RULE OF 15 for the treatment of hypoglycemia treatment (When your blood sugars are less than 70 mg/ dl) STEP  1:  Take 15 grams of carbohydrates when your blood sugar is low, which includes:   3-4 glucose tabs or  3-4 oz of juice or regular soda or  One tube of glucose gel STEP 2:  Recheck blood sugar in 15 minutes STEP 3:  If your blood sugar is still low at the 15 minute recheck ---then, go back to STEP 1 and treat again  with another 15 grams of carbohydrates  Follow Up Plan: Telephone follow up appointment with care management team member scheduled for:   07-26-2023 at 1pm           Our next appointment is by telephone on 07-26-2023 at 1 pm  Please call the care guide team at [phone] if you need to cancel or reschedule your appointment.   If you are experiencing a Mental Health or Behavioral Health Crisis or need someone to talk to, please call the Suicide and Crisis Lifeline: 988 call the [place] National Suicide Prevention Lifeline: (660)468-8505 or TTY: (720)128-3948 TTY 307-266-7500) to talk to a trained counselor call 1-800-273-TALK (toll free, 24 hour hotline) call  the [facility]: (760)887-7547 call 911   Patient verbalizes understanding of instructions and care plan provided today and agrees to view in MyChart. Active MyChart status and patient understanding of how to access instructions and care plan via MyChart confirmed with patient.       Alto Denver RN, MSN, CCM RN Care Manager  [place]  [facility]  Direct Number: [phone]   Shingles  Shingles is an infection. It gives you a painful skin rash and blisters that have fluid in them. Shingles is caused by the same germ (virus) that causes chickenpox. Shingles only happens in people who: Have had chickenpox. Have been given a shot (vaccine) to protect against chickenpox. Shingles is rare in this group. What are the causes? This condition is caused by varicella-zoster virus. This is the same germ that causes chickenpox. After a person is exposed to the germ, the germ stays in the body but is not active (dormant). Shingles develops if the germ becomes active again (is reactivated). This can happen many years after the first exposure to the germ. It is not known what causes this germ to become active again. What increases the risk? People who have had chickenpox or received the chickenpox shot are at risk for shingles. This infection is more common in people who: Are older than 66 years of age. Have a weakened disease-fighting system (immune system), such as people with: HIV (human immunodeficiency virus). AIDS (acquired immunodeficiency syndrome). Cancer. Are taking medicines that weaken the immune system, such as organ transplant medicines. Have a lot of stress. What are the signs or symptoms? The first symptoms of shingles may be itching, tingling, or pain in an area on your skin. A rash will show on your skin a few days or weeks later. This is what usually happens: The rash is likely to be on one side of your body. The rash usually has a shape like a  belt or a band. Over time, the rash turns into fluid-filled blisters. The blisters will break open and change into scabs. The scabs usually dry up in about 2-3 weeks. You may also have: A fever. Chills. A headache. A feeling like you may vomit (nausea). How is this treated? The rash may last for several weeks. There is not a specific cure for this condition. Your doctor may prescribe medicines. Medicines may: Help with pain. Help you get better sooner. Help to prevent long-term problems. Help with itching (antihistamines). If the area involved is on your face, you may need to see a specialist. This may be an eye doctor or an ear, nose, and throat (ENT) doctor. Follow these instructions at home: Medicines Take over-the-counter and prescription medicines only as told by your doctor. Put on an anti-itch cream or numbing cream where you  have a rash, blisters, or scabs. Do this as told by your doctor. Helping with itching and discomfort  Put cold, wet cloths (cold compresses) on the area of the rash or blisters as told by your doctor. Cool baths can help you feel better. Try adding baking soda or dry oatmeal to the water to lessen itching. Do not bathe in hot water. Use calamine lotion as told by your doctor. Blister and rash care Keep your rash covered with a loose bandage (dressing). Wear loose clothing that does not rub on your rash. Wash your hands with soap and water for at least 20 seconds before and after you change your bandage. If you cannot use soap and water, use hand sanitizer. Change your bandage as told by your doctor. Keep your rash and blisters clean. To do this, wash the area with mild soap and cool water as told by your doctor. Check your rash every day for signs of infection. Check for: More redness, swelling, or pain. Fluid or blood. Warmth. Pus or a bad smell. Do not scratch your rash. Do not pick at your blisters. To help you to not scratch: Keep your fingernails  clean and cut short. Wear gloves or mittens when you sleep, if scratching is a problem. General instructions Rest as told by your doctor. Wash your hands often with soap and water for at least 20 seconds. If you cannot use soap and water, use hand sanitizer. Doing this lowers your chance of getting a skin infection. Your infection can cause chickenpox in people who have never had chickenpox or never got a chickenpox vaccine shot. If you have blisters that did not change into scabs yet, try not to touch other people or be around other people, especially: Babies. Pregnant women. Children who have areas of red, itchy, or rough skin (eczema). Older people who have organ transplants. People who have a long-term (chronic) illness, like cancer or AIDS. Keep all follow-up visits. How is this prevented? A vaccine shot is the best way to prevent shingles and protect against shingles problems. If you have not had a vaccine shot, talk with your doctor about getting it. Where to find more information Centers for Disease Control and Prevention: [employer] Contact a doctor if: Your pain does not get better with medicine. Your pain does not get better after the rash heals. You have any of these signs of infection around the rash: More redness, swelling, or pain. Fluid or blood. Warmth. Pus or a bad smell. You have a fever. Get help right away if: The rash is on your face or nose. You have pain in your face or pain by your eye. You lose feeling on one side of your face. You have trouble seeing. You have ear pain, or you have ringing in your ear. You have a loss of taste. Your condition gets worse. Summary Shingles gives you a painful skin rash and blisters that have fluid in them. Shingles is caused by the same germ (virus) that causes chickenpox. Keep your rash covered with a loose bandage. Wear loose clothing that does not rub on your rash. If you have blisters that did not change into scabs  yet, try not to touch other people or be around people. This information is not intended to replace advice given to you by your health care provider. Make sure you discuss any questions you have with your health care provider. Document Revised: 06/27/2020 Document Reviewed: 06/27/2020 Elsevier Patient Education  2024 [employer].

## 2023-05-24 NOTE — Patient Outreach (Signed)
Care Management   Visit Note  05/24/2023 Name: Melissa Schneider MRN: [id number] DOB: 1956-05-17  Subjective: Melissa Schneider is a 67 y.o. year old female who is a primary care patient of Raliegh Ip, DO. The Care Management team was consulted for assistance.      Engaged with patient spoke with patient by telephone.    Goals Addressed             This Visit's Progress    RNCM Care Management  (HYPERTENSION) EXPECTED OUTCOME: MONITOR, SELF-MANAGE AND REDUCE SYMPTOMS OF HYPERTENSION       Current Barriers:  Knowledge Deficits related to Hypertension management Care Coordination needs related to pharmacy, medication in a patient with Hypertension Chronic Disease Management support and education needs related to Hypertension No Advanced Directives in place- pt reports she has documents on hand to complete Patient reports she lives with spouse, is independent with all aspects of her care, drives, volunteers at [address], walks daily for 1.5-3 miles Patient reports she checks blood pressure once per week and states "readings are good" Patient does still have occasional migrane and reports Imitrex is helping her manage BP Readings from Last 3 Encounters:  05/13/23 127/65  05/08/23 128/82  05/06/23 (!) 155/86     Planned Interventions: Evaluation of current treatment plan related to hypertension self management and patient's adherence to plan as established by provider. Blood pressures are stable. Denies any new concerns with blood pressures. Knows to call for changes. Has a blood pressure cuff and can check when needed. ;   Reviewed medications with patient and discussed importance of compliance;  Counseled on the importance of exercise goals with target of 150 minutes per week Advised patient, providing education and rationale, to monitor blood pressure daily and record, calling PCP for findings outside established parameters;  Reviewed low sodium diet  Symptom  Management: Take medications as prescribed   Attend all scheduled provider appointments Call pharmacy for medication refills 3-7 days in advance of running out of medications Attend church or other social activities Perform all self care activities independently  Perform IADL's (shopping, preparing meals, housekeeping, managing finances) independently Call provider office for new concerns or questions  check blood pressure weekly choose a place to take my blood pressure (home, clinic or office, retail store) write blood pressure results in a log or diary keep a blood pressure log take blood pressure log to all doctor appointments keep all doctor appointments take medications for blood pressure exactly as prescribed report new symptoms to your doctor eat more whole grains, fruits and vegetables, lean meats and healthy fats Continue walking daily - keep up the good work! Follow low sodium diet Read food labels for sodium content Limit/ avoid fast food  Follow Up Plan: Telephone follow up appointment with care management team member scheduled for:   07-26-2023 at 1 pm       RNCM Care Mangement:  (DIABETES) EXPECTED OUTCOME: MONITOR, SELF-MANAGE AND REDUCE SYMPTOMS OF DIABETES       Current Barriers:  Knowledge Deficits related to Diabetes management Chronic Disease Management support and education needs related to Diabetes, ciet No Advanced Directives in place- pt has documents on hand Patient reports CBG is monitored with Freestyle Libre and she checks readings randomly against her glucometer to make sure reading correctly. Patient reports FBS ranges in 100-110 range, random ranges 160-200, Patient verbalizes understanding of correct treatment for hypoglycemia and stays in touch with primary care provider about blood sugar readings, plan  of care, and medication adjustments, pt feels CBG readings are much improved PharmD has been working with pt   Lab Results  Component Value Date    HGBA1C 6.8 (H) 04/16/2023    Planned Interventions: Provided education to patient about basic DM disease process. Review due to the patient having a diagnosis of shingles and had some elevations due to her receiving a steroid shot. The patient states she had readings as high as 300 but they have come down to normal now. ; Reviewed medications with patient and discussed importance of medication adherence. Patient is compliant with medications;        Counseled on importance of regular laboratory monitoring as prescribed. Has labs on a regular basis, labs are up to date;        Discussed plans with patient for ongoing care management follow up and provided patient with direct contact information for care management team;      Advised patient, providing education and rationale, to check cbg per CGM  and record. Has a freestyle libre and keeps good check on her blood sugar readings.        call provider for findings outside established parameters;       Review of patient status, including review of consultants reports, relevant laboratory and other test results, and medications completed;       Reinforced carbohydrate modified diet and food choices Had eye exam in October and has a script for new glasses. She has cataracts but wants to wait to have surgery for removal due to some health concerns of her sister and her helping her.  She is thankful that she is able to do her activities despite the pain she is having from recent shingles. Review of things to help alleviate shingles pain. Education provided. Will attach information in the chart for the patient. 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 She is active with her family and friends.   Symptom Management: Take medications as prescribed   Attend all scheduled provider appointments Call pharmacy for medication refills 3-7 days in advance of running out of medications Attend church or other  social activities Perform all self care activities independently  Perform IADL's (shopping, preparing meals, housekeeping, managing finances) independently Call provider office for new concerns or questions  check blood sugar at prescribed times: per [facility]  check feet daily for cuts, sores or redness enter blood sugar readings and medication or insulin into daily log take the blood sugar log to all doctor visits take the blood sugar meter to all doctor visits trim toenails straight across fill half of plate with vegetables limit fast food meals to no more than 1 per week manage portion size prepare main meal at home 3 to 5 days each week read food labels for fat, fiber, carbohydrates and portion size set a realistic goal Continue walking daily- keep up the good work! Follow RULE OF 15 for low blood sugar management:  How to treat low blood sugars (Blood sugar less than 70 mg/dl  Please follow the RULE OF 15 for the treatment of hypoglycemia treatment (When your blood sugars are less than 70 mg/ dl) STEP  1:  Take 15 grams of carbohydrates when your blood sugar is low, which includes:   3-4 glucose tabs or  3-4 oz of juice or regular soda or  One tube of glucose gel STEP 2:  Recheck blood sugar in 15 minutes STEP 3:  If  your blood sugar is still low at the 15 minute recheck ---then, go back to STEP 1 and treat again with another 15 grams of carbohydrates  Follow Up Plan: Telephone follow up appointment with care management team member scheduled for:   07-26-2023 at 1pm           Consent to Services:  Patient was given information about care management services, agreed to services, and gave verbal consent to participate.   Plan: Telephone follow up appointment with care management team member scheduled for: 07-26-2023 at 1 pm  Alto Denver RN, MSN, CCM RN Care Manager  [facility]  Ambulatory Care Management  Direct Number: [phone]

## 2023-05-28 ENCOUNTER — Other Ambulatory Visit: Payer: Self-pay | Admitting: Nurse Practitioner

## 2023-05-28 DIAGNOSIS — B0229 Other postherpetic nervous system involvement: Secondary | ICD-10-CM | POA: Insufficient documentation

## 2023-05-28 MED ORDER — ZIKS ARTHRITIS PAIN RELIEF 0.025-1-12 % EX CREA: 1.0000 | TOPICAL_CREAM | Freq: Two times a day (BID) | CUTANEOUS | 0 refills | Status: DC | PRN

## 2023-05-28 NOTE — Progress Notes (Signed)
Postherpetic Neuralgia is common with post shingles, a script for capsaicin sent to your pharmacy.

## 2023-05-31 ENCOUNTER — Telehealth: Payer: Self-pay | Admitting: Pharmacist

## 2023-05-31 NOTE — Telephone Encounter (Signed)
I think this patient got confused with the other Antony Blackbird (we have two on [facility] PAP) This patient is on 1mg  The other shadiamond legate I will route to you  Very hard to track since no DOB

## 2023-05-31 NOTE — Telephone Encounter (Signed)
Ozempic 0.5mg  was shipped for this patient Patient ID [id number] Ship date 05/27/23 5 boxes She is supposed to get the 1mg  I'll hold it until we follow up  They have messed this up multiple times  Thank you!!

## 2023-06-03 NOTE — Telephone Encounter (Signed)
Rec'd a fax from [facility] for patients Ozempic 0.25/0.5mg  pens; shipment processed 05/21/23 and 0 refills remain.   Enrollment ends 11/02/23.  That may have been her order. Going to email the dose increase form now!

## 2023-06-07 ENCOUNTER — Telehealth: Payer: Self-pay | Admitting: Family Medicine

## 2023-06-10 NOTE — Telephone Encounter (Signed)
Faxed Ozempic 1mg  dose refills to [facility].

## 2023-06-11 ENCOUNTER — Telehealth: Payer: Self-pay

## 2023-06-11 NOTE — Telephone Encounter (Unsigned)
This was handled in another encounter    Copied from CRM [id number]. Topic: Clinical - Medication Refill >> Jun 10, 2023  4:52 PM Thomes Dinning wrote: Most Recent Primary Care Visit:  Provider: [facility] [place], [place]  Department: [facility] MED  Visit Type: OFFICE VISIT  Date: 05/13/2023  Medication: Ozempic  Has the patient contacted their pharmacy? No (Agent: If no, request that the patient contact the pharmacy for the refill. If patient does not wish to contact the pharmacy document the reason why and proceed with request.) (Agent: If yes, when and what did the pharmacy advise?)  Is this the correct pharmacy for this prescription? Yes If no, delete pharmacy and type the correct one.  This is the patient's preferred pharmacy:  [facility] - [facility]a [facility],  - [address] [place] [address] Phone: [phone] Fax: [phone]  [facility] [place] (New Address) - [place], [place] - [address] AT Previously: [place] [address], [place] [address] [place] [address] Phone: [phone] Fax: [phone]   Has the prescription been filled recently? No  Is the patient out of the medication? Yes  Has the patient been seen for an appointment in the last year OR does the patient have an upcoming appointment? Yes  Can we respond through MyChart? Yes  Agent: Please be advised that Rx refills may take up to 3 business days. We ask that you follow-up with your pharmacy.

## 2023-06-11 NOTE — Patient Outreach (Signed)
  Care Management   Follow Up Note   06/11/2023 Name: Melissa Schneider MRN: [id number] DOB: 1956-01-28   Referred by: Raliegh Ip, DO Reason for referral : Care Management (RNCM: Patient called and left a VM asking for a call back. She needs help with getting her Ozempic)   The patient called and left a VM asking for assistance with medication needs of her Ozempic. Sent an in basket message to the pcp, Museum/gallery curator, and the clinical staff at [facility] asking for them to please follow up with the patient concerning her medication questions/need.     Alto Denver RN, MSN, CCM RN Care Manager  [facility]  Ambulatory Care Management  Direct Number: [phone]

## 2023-06-12 ENCOUNTER — Encounter: Payer: Self-pay | Admitting: Family Medicine

## 2023-06-12 NOTE — Telephone Encounter (Signed)
Can you grab her a sample from [facility] fridge & let her know it's here I submitted for her dose change (for the 1mg  weekly) recently Will call when we have 1mg  in Thanks!

## 2023-06-12 NOTE — Telephone Encounter (Signed)
What's the turn around time for this? Patient is wanting to know when she can expect her order?

## 2023-06-12 NOTE — Telephone Encounter (Signed)
Yes! I routed message to clinical pool to grab a sample We sent in dose change form for 1mg  dose Awaiting shipment; everything is delayed but in process

## 2023-06-28 ENCOUNTER — Telehealth: Payer: Self-pay | Admitting: *Deleted

## 2023-06-28 NOTE — Progress Notes (Unsigned)
  Care Coordination Note  06/28/2023 Name: CINIYAH TELLA MRN: [id number] DOB: 18-May-1956  ZAFINA ALDAVA is a 67 y.o. year old female who is a primary care patient of Raliegh Ip, DO and is actively engaged with the care management team. I reached out to Clyde Canterbury by phone today to assist with re-scheduling a follow up visit with the RN Case Manager  Follow up plan: Unsuccessful telephone outreach attempt made. A HIPAA compliant phone message was left for the patient providing contact information and requesting a return call.   [facility]  Care Coordination Care Guide  Direct Dial: [phone]

## 2023-07-01 NOTE — Progress Notes (Signed)
  Care Coordination Note  07/01/2023 Name: Melissa Schneider MRN: [id number] DOB: 1955-09-26  Melissa Schneider is a 67 y.o. year old female who is a primary care patient of Raliegh Ip, DO and is actively engaged with the care management team. I reached out to Clyde Canterbury by phone today to assist with scheduling a follow up visit with the RN Case Manager  Follow up plan: Telephone appointment with care management team member scheduled for:07/18/23  [facility] Coordination Care Guide  Direct Dial: [phone]

## 2023-07-05 ENCOUNTER — Telehealth: Payer: Self-pay | Admitting: Pharmacist

## 2023-07-05 ENCOUNTER — Encounter: Payer: Self-pay | Admitting: Pharmacist

## 2023-07-05 NOTE — Telephone Encounter (Signed)
   Patient enrolled in the [employer] patient assistance program for Ozempic.  Her 1mg  shipment arrived at PCP office for pick up (1mg  dose #4 boxes).  Labeled in lab fridge for. Patient is stable on current regimen.  She will be re-enroll for 2025.  Kieth Brightly, PharmD, BCACP, CPP Clinical Pharmacist, [facility]

## 2023-07-05 NOTE — Telephone Encounter (Signed)
 Results abstracted and Care Team updated

## 2023-07-15 ENCOUNTER — Encounter (INDEPENDENT_AMBULATORY_CARE_PROVIDER_SITE_OTHER): Payer: Self-pay | Admitting: *Deleted

## 2023-07-18 ENCOUNTER — Encounter: Payer: Self-pay | Admitting: *Deleted

## 2023-07-18 ENCOUNTER — Other Ambulatory Visit: Payer: Self-pay | Admitting: *Deleted

## 2023-07-18 NOTE — Patient Instructions (Signed)
 Visit Information  Thank you for taking time to visit with me today. Please don't hesitate to contact me if I can be of assistance to you before our next scheduled telephone appointment.  Following are the goals we discussed today:   Goals Addressed             This Visit's Progress    RNCM Care Management  (HYPERTENSION) EXPECTED OUTCOME: MONITOR, SELF-MANAGE AND REDUCE SYMPTOMS OF HYPERTENSION       Current Barriers:  Knowledge Deficits related to Hypertension management Care Coordination needs related to pharmacy, medication in a patient with Hypertension Chronic Disease Management support and education needs related to Hypertension No Advanced Directives in place- pt reports she has documents on hand to complete Patient reports she lives with spouse, is independent with all aspects of her care, drives, volunteers [address], walks daily for 1.5-3 miles Patient reports she checks blood pressure once per week and states readings are good Patient does still have occasional migrane and reports Imitrex  is helping her manage BP Readings from Last 3 Encounters:  05/13/23 127/65  05/08/23 128/82  05/06/23 (!) 155/86     Planned Interventions: Evaluation of current treatment plan related to hypertension self management and patient's adherence to plan as established by provider. Checks blood pressure regularly. Last 3 readings: 119/80, 125/73, 138/79. RNCM reviewed goals of SBP <140, DBP<90.   Reviewed medications with patient and discussed importance of compliance; Reports compliance with all medications Counseled on the importance of exercise goals with target of 150 minutes per week. Walks at minimum one mile daily with her dogs. Advised patient, providing education and rationale, to monitor blood pressure daily and record, calling PCP for findings outside established parameters. Does not check daily but does check often and record findings. Reviewed low sodium diet  Symptom  Management: Take medications as prescribed   Attend all scheduled provider appointments Call pharmacy for medication refills 3-7 days in advance of running out of medications Attend church or other social activities Perform all self care activities independently  Perform IADL's (shopping, preparing meals, housekeeping, managing finances) independently Call provider office for new concerns or questions  check blood pressure weekly choose a place to take my blood pressure (home, clinic or office, retail store) write blood pressure results in a log or diary keep a blood pressure log take blood pressure log to all doctor appointments keep all doctor appointments take medications for blood pressure exactly as prescribed report new symptoms to your doctor eat more whole grains, fruits and vegetables, lean meats and healthy fats Continue walking daily - keep up the good work! Follow low sodium diet Read food labels for sodium content Limit/ avoid fast food  Follow Up Plan: Telephone follow up appointment with care management team member scheduled for:   09-09-2023 at 10:15 am       RNCM Care Mangement:  (DIABETES) EXPECTED OUTCOME: MONITOR, SELF-MANAGE AND REDUCE SYMPTOMS OF DIABETES       Current Barriers:  Knowledge Deficits related to Diabetes management Chronic Disease Management support and education needs related to Diabetes, ciet No Advanced Directives in place- pt has documents on hand   Lab Results  Component Value Date   HGBA1C 6.8 (H) 04/16/2023    Planned Interventions: Provided education to patient about basic DM disease process. Reports fasting of 115 this morning. Lowest reading 53 and highest 285 which she contributes to holiday season. She is actively working to get back on track. RNCM reviewed fasting goal  of <130 and post prandial of <180. Reviewed medications with patient and discussed importance of medication adherence. Compliant with all medications Counseled on  importance of regular laboratory monitoring as prescribed. Has labs on a regular basis, labs are up to date;        Discussed plans with patient for ongoing care management follow up and provided patient with direct contact information for care management team;      Advised patient, providing education and rationale, to check cbg per CGM  and record. Has a freestyle libre and keeps good check on her blood sugar readings.        call provider for findings outside established parameters;       Review of patient status, including review of consultants reports, relevant laboratory and other test results, and medications completed;       Reinforced carbohydrate modified diet and food choices  Symptom Management: Take medications as prescribed   Attend all scheduled provider appointments Call pharmacy for medication refills 3-7 days in advance of running out of medications Attend church or other social activities Perform all self care activities independently  Perform IADL's (shopping, preparing meals, housekeeping, managing finances) independently Call provider office for new concerns or questions  check blood sugar at prescribed times: per [facility]  check feet daily for cuts, sores or redness enter blood sugar readings and medication or insulin  into daily log take the blood sugar log to all doctor visits take the blood sugar meter to all doctor visits trim toenails straight across fill half of plate with vegetables limit fast food meals to no more than 1 per week manage portion size prepare main meal at home 3 to 5 days each week read food labels for fat, fiber, carbohydrates and portion size set a realistic goal Continue walking daily- keep up the good work! Follow RULE OF 15 for low blood sugar management:  How to treat low blood sugars (Blood sugar less than 70 mg/dl  Please follow the RULE OF 15 for the treatment of hypoglycemia treatment (When your blood sugars are less than 70  mg/ dl) STEP  1:  Take 15 grams of carbohydrates when your blood sugar is low, which includes:   3-4 glucose tabs or  3-4 oz of juice or regular soda or  One tube of glucose gel STEP 2:  Recheck blood sugar in 15 minutes STEP 3:  If your blood sugar is still low at the 15 minute recheck ---then, go back to STEP 1 and treat again with another 15 grams of carbohydrates  Follow Up Plan: Telephone follow up appointment with care management team member scheduled for:   09-09-2023 at 10:15 am           Our next appointment is by telephone on 09-09-2023 at 10:15 am  Please call the care guide team at [phone] if you need to cancel or reschedule your appointment.   If you are experiencing a Mental Health or Behavioral Health Crisis or need someone to talk to, please call the Suicide and Crisis Lifeline: 988 call the [place]  National Suicide Prevention Lifeline: (779)380-0637 or TTY: 463 109 4212 TTY 703-050-6006) to talk to a trained counselor call 1-800-273-TALK (toll free, 24 hour hotline) call 911   Patient verbalizes understanding of instructions and care plan provided today and agrees to view in MyChart. Active MyChart status and patient understanding of how to access instructions and care plan via MyChart confirmed with patient.     Telephone follow up appointment with  care management team member scheduled for:09-09-2023 at 10:15 am  Rosina Forte, BSN RN RN Care Manager  [facility]  Ambulatory Care Management  Direct Number: [phone]

## 2023-07-18 NOTE — Patient Outreach (Signed)
 Care Management   Visit Note  07/18/2023 Name: Melissa Schneider MRN: [id number] DOB: 1955-09-12  Subjective: Melissa Schneider is a 68 y.o. year old female who is a primary care patient of Jolinda Norene HERO, DO. The Care Management team was consulted for assistance.      Engaged with patient spoke with patient by telephone.    Goals Addressed             This Visit's Progress    RNCM Care Management  (HYPERTENSION) EXPECTED OUTCOME: MONITOR, SELF-MANAGE AND REDUCE SYMPTOMS OF HYPERTENSION       Current Barriers:  Knowledge Deficits related to Hypertension management Care Coordination needs related to pharmacy, medication in a patient with Hypertension Chronic Disease Management support and education needs related to Hypertension No Advanced Directives in place- pt reports she has documents on hand to complete Patient reports she lives with spouse, is independent with all aspects of her care, drives, volunteers [address], walks daily for 1.5-3 miles Patient reports she checks blood pressure once per week and states readings are good Patient does still have occasional migrane and reports Imitrex  is helping her manage BP Readings from Last 3 Encounters:  05/13/23 127/65  05/08/23 128/82  05/06/23 (!) 155/86     Planned Interventions: Evaluation of current treatment plan related to hypertension self management and patient's adherence to plan as established by provider. Checks blood pressure regularly. Last 3 readings: 119/80, 125/73, 138/79. RNCM reviewed goals of SBP <140, DBP<90.   Reviewed medications with patient and discussed importance of compliance; Reports compliance with all medications Counseled on the importance of exercise goals with target of 150 minutes per week. Walks at minimum one mile daily with her dogs. Advised patient, providing education and rationale, to monitor blood pressure daily and record, calling PCP for findings outside established parameters. Does  not check daily but does check often and record findings. Reviewed low sodium diet  Symptom Management: Take medications as prescribed   Attend all scheduled provider appointments Call pharmacy for medication refills 3-7 days in advance of running out of medications Attend church or other social activities Perform all self care activities independently  Perform IADL's (shopping, preparing meals, housekeeping, managing finances) independently Call provider office for new concerns or questions  check blood pressure weekly choose a place to take my blood pressure (home, clinic or office, retail store) write blood pressure results in a log or diary keep a blood pressure log take blood pressure log to all doctor appointments keep all doctor appointments take medications for blood pressure exactly as prescribed report new symptoms to your doctor eat more whole grains, fruits and vegetables, lean meats and healthy fats Continue walking daily - keep up the good work! Follow low sodium diet Read food labels for sodium content Limit/ avoid fast food  Follow Up Plan: Telephone follow up appointment with care management team member scheduled for:   09-09-2023 at 10:15 am       RNCM Care Mangement:  (DIABETES) EXPECTED OUTCOME: MONITOR, SELF-MANAGE AND REDUCE SYMPTOMS OF DIABETES       Current Barriers:  Knowledge Deficits related to Diabetes management Chronic Disease Management support and education needs related to Diabetes, ciet No Advanced Directives in place- pt has documents on hand   Lab Results  Component Value Date   HGBA1C 6.8 (H) 04/16/2023    Planned Interventions: Provided education to patient about basic DM disease process. Reports fasting of 115 this morning. Lowest reading 53 and highest  285 which she contributes to holiday season. She is actively working to get back on track. RNCM reviewed fasting goal of <130 and post prandial of <180. Reviewed medications with patient  and discussed importance of medication adherence. Compliant with all medications Counseled on importance of regular laboratory monitoring as prescribed. Has labs on a regular basis, labs are up to date;        Discussed plans with patient for ongoing care management follow up and provided patient with direct contact information for care management team;      Advised patient, providing education and rationale, to check cbg per CGM  and record. Has a freestyle libre and keeps good check on her blood sugar readings.        call provider for findings outside established parameters;       Review of patient status, including review of consultants reports, relevant laboratory and other test results, and medications completed;       Reinforced carbohydrate modified diet and food choices  Symptom Management: Take medications as prescribed   Attend all scheduled provider appointments Call pharmacy for medication refills 3-7 days in advance of running out of medications Attend church or other social activities Perform all self care activities independently  Perform IADL's (shopping, preparing meals, housekeeping, managing finances) independently Call provider office for new concerns or questions  check blood sugar at prescribed times: per [facility]  check feet daily for cuts, sores or redness enter blood sugar readings and medication or insulin  into daily log take the blood sugar log to all doctor visits take the blood sugar meter to all doctor visits trim toenails straight across fill half of plate with vegetables limit fast food meals to no more than 1 per week manage portion size prepare main meal at home 3 to 5 days each week read food labels for fat, fiber, carbohydrates and portion size set a realistic goal Continue walking daily- keep up the good work! Follow RULE OF 15 for low blood sugar management:  How to treat low blood sugars (Blood sugar less than 70 mg/dl  Please follow the  RULE OF 15 for the treatment of hypoglycemia treatment (When your blood sugars are less than 70 mg/ dl) STEP  1:  Take 15 grams of carbohydrates when your blood sugar is low, which includes:   3-4 glucose tabs or  3-4 oz of juice or regular soda or  One tube of glucose gel STEP 2:  Recheck blood sugar in 15 minutes STEP 3:  If your blood sugar is still low at the 15 minute recheck ---then, go back to STEP 1 and treat again with another 15 grams of carbohydrates  Follow Up Plan: Telephone follow up appointment with care management team member scheduled for:   09-09-2023 at 10:15 am              Consent to Services:  Patient was given information about care management services, agreed to services, and gave verbal consent to participate.   Plan: Telephone follow up appointment with care management team member scheduled for:09-09-2023 at 10:15 am  Rosina Forte, BSN RN RN Care Manager  [facility]  Ambulatory Care Management  Direct Number: [phone]

## 2023-07-26 ENCOUNTER — Other Ambulatory Visit: Payer: PPO

## 2023-08-30 ENCOUNTER — Ambulatory Visit (INDEPENDENT_AMBULATORY_CARE_PROVIDER_SITE_OTHER): Payer: PPO | Admitting: Family Medicine

## 2023-08-30 ENCOUNTER — Encounter: Payer: Self-pay | Admitting: Family Medicine

## 2023-08-30 VITALS — BP 119/78 | HR 75 | Temp 98.7°F | Ht 62.0 in | Wt 194.0 lb

## 2023-08-30 DIAGNOSIS — R5382 Chronic fatigue, unspecified: Secondary | ICD-10-CM | POA: Diagnosis not present

## 2023-08-30 DIAGNOSIS — E039 Hypothyroidism, unspecified: Secondary | ICD-10-CM

## 2023-08-30 DIAGNOSIS — R41840 Attention and concentration deficit: Secondary | ICD-10-CM

## 2023-08-30 DIAGNOSIS — E1169 Type 2 diabetes mellitus with other specified complication: Secondary | ICD-10-CM

## 2023-08-30 DIAGNOSIS — R718 Other abnormality of red blood cells: Secondary | ICD-10-CM | POA: Diagnosis not present

## 2023-08-30 DIAGNOSIS — Z7985 Long-term (current) use of injectable non-insulin antidiabetic drugs: Secondary | ICD-10-CM

## 2023-08-30 DIAGNOSIS — Z794 Long term (current) use of insulin: Secondary | ICD-10-CM

## 2023-08-30 DIAGNOSIS — E1142 Type 2 diabetes mellitus with diabetic polyneuropathy: Secondary | ICD-10-CM | POA: Diagnosis not present

## 2023-08-30 DIAGNOSIS — E1159 Type 2 diabetes mellitus with other circulatory complications: Secondary | ICD-10-CM

## 2023-08-30 DIAGNOSIS — E785 Hyperlipidemia, unspecified: Secondary | ICD-10-CM | POA: Diagnosis not present

## 2023-08-30 MED ORDER — BUPROPION HCL ER (SR) 150 MG PO TB12: 150.0000 mg | ORAL_TABLET | Freq: Every morning | ORAL | 0 refills | Status: DC

## 2023-08-30 NOTE — Patient Instructions (Signed)
See me back on 09/27/2023 at 12:05pm Message me on your mychart sooner if any concerns arise with the new med.

## 2023-08-30 NOTE — Progress Notes (Signed)
Subjective: CC:DM PCP: Raliegh Ip, DO NWG:NFAOZHY Melissa Schneider is a 68 y.o. female presenting to clinic today for:  1. Type 2 Diabetes with hypertension, hyperlipidemia:  Compliant with freestyle libre 3 sensor for CGM.  She is compliant with 10 units of Lantus at bedtime.  Continues to use a sliding scale NovoLog, Ozempic 1 mg weekly, Crestor 20 mg daily and losartan 25mg  Diabetes Health Maintenance Due  Topic Date Due   FOOT EXAM  10/03/2023   HEMOGLOBIN A1C  10/15/2023   OPHTHALMOLOGY EXAM  05/19/2024    Last A1c:  Lab Results  Component Value Date   HGBA1C 6.8 (H) 04/16/2023    ROS: denies dizziness, LOC, polyuria, polydipsia, unintended weight loss/gain, foot ulcerations, numbness or tingling in extremities, shortness of breath or chest pain.   2.  Fatigue Sugar feeling fatigued.  She often feels overwhelmed and finds it difficulty to complete tasks.  She denies any depressive symptoms.  She still enjoys things that normally bring her happiness and is not sad at all.  She has a good life and feels well supported by her friends and family.  She has not discussed the symptoms with her family however.  Wondering if maybe she has some type of vitamin deficiency.  She drinks coffee in efforts to stimulate herself.  She really has not slept well since she had shingles and she still has some back pain at times form where the shingles was.  Sometimes this area is numb as well.  She has been utilizing a back brace as needed.  ROS: Per HPI  Allergies  Allergen Reactions   Codeine Nausea Only and Other (See Comments)    NAUSEA AND CAUSES HER TO HAVE BAD DREAMS   Gabapentin     Confusion    Lisinopril     Coughing   Past Medical History:  Diagnosis Date   Anemia 07/23/2011   Anxiety    Diabetes mellitus    Diabetic gastroparesis (HCC) 07/23/2011   Suspected   Headache(784.0)    Hearing difficulty    Hyperlipidemia    Hypothyroidism    Kidney stones    Microcytic  anemia 07/23/2011   Thalassemia minor    ????? not really worked up per pt but daughter has it???    Current Outpatient Medications:    Aloe Vera 72 % CREA, Apply 1 Application topically 2 (two) times daily as needed., Disp: 114 g, Rfl: 0   aspirin 81 MG tablet, Take 81 mg by mouth daily., Disp: , Rfl:    Capsaicin-Menthol-Methyl Sal (CAPSAICIN-METHYL SAL-MENTHOL) 0.025-1-12 % CREA, Apply 1 Application topically 2 (two) times daily as needed., Disp: 56.6 g, Rfl: 0   Continuous Glucose Sensor (FREESTYLE LIBRE 3 SENSOR) MISC, CHANGE EVERY 14 DAYS, Disp: 6 each, Rfl: 3   cyclobenzaprine (FLEXERIL) 10 MG tablet, Take 1 tablet (10 mg total) by mouth 3 (three) times daily as needed for muscle spasms., Disp: 30 tablet, Rfl: 0   docusate sodium (COLACE) 100 MG capsule, Take 200 mg by mouth daily as needed. , Disp: , Rfl:    glucose blood (GNP EASY TOUCH GLUCOSE TEST) test strip, EVERY DAY, Disp: 100 each, Rfl: 11   insulin aspart (NOVOLOG FLEXPEN) 100 UNIT/ML FlexPen, Inject 10-17 Units into the skin 3 (three) times daily with meals., Disp: 45 mL, Rfl: PRN   insulin glargine (LANTUS) 100 UNIT/ML injection, INJECT 0.22-0.3MLS (22-30 UNITS) INTO THE SKIN AT BEDTIME (Patient taking differently: Inject 10 Units into the skin at bedtime.),  Disp: 30 mL, Rfl: PRN   Insulin Pen Needle (PEN NEEDLES) 32G X 4 MM MISC, UAD E11.69, Disp: 100 each, Rfl: 3   Insulin Syringe-Needle U-100 (INSULIN SYRINGE .5CC/31GX5/16") 31G X 5/16" 0.5 ML MISC, UAD with Lantus E11.69, Disp: 100 each, Rfl: 3   levocetirizine (XYZAL) 5 MG tablet, Take 1 tablet (5 mg total) by mouth at bedtime as needed for allergies (itching/ rash)., Disp: 90 tablet, Rfl: 0   levothyroxine (SYNTHROID) 137 MCG tablet, TAKE ONE TABLET EACH MORNING BEFORE BREAKFAST, Disp: 90 tablet, Rfl: 1   losartan (COZAAR) 25 MG tablet, TAKE ONE (1) TABLET EACH DAY, Disp: 90 tablet, Rfl: 3   Multiple Vitamin (MULTIVITAMIN WITH MINERALS) TABS tablet, Take 1 tablet by mouth  daily., Disp: , Rfl:    predniSONE (DELTASONE) 20 MG tablet, 2 po at same time daily for 5 days, Disp: 10 tablet, Rfl: 0   rosuvastatin (CRESTOR) 20 MG tablet, Take 1 tablet (20 mg total) by mouth daily., Disp: 90 tablet, Rfl: 3   Semaglutide, 1 MG/DOSE, 4 MG/3ML SOPN, Inject 1 mg as directed once a week., Disp: 9 mL, Rfl: 3   SUMAtriptan (IMITREX) 100 MG tablet, May repeat in 2 hours if headache persists or recurs., Disp: 9 tablet, Rfl: PRN   triamcinolone cream (KENALOG) 0.1 %, Apply 1 application. topically 2 (two) times daily. 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   Occupation: Retired  Tobacco Use   Smoking status: Never   Smokeless tobacco: Never   Tobacco comments:    Never smoked  Vaping Use   Vaping status: Never Used  Substance and Sexual Activity   Alcohol use: No    Alcohol/week: 0.0 standard drinks of alcohol   Drug use: No   Sexual activity: Not Currently  Other Topics Concern   Not on file  Social History Narrative   Lives home with husband - Daughter lives in [place] - she has a great grandchild also   Social Drivers of Corporate investment banker Strain: Low Risk  (07/18/2023)   Overall Financial Resource Strain (CARDIA)    Difficulty of Paying Living Expenses: Not hard at all  Food Insecurity: No Food Insecurity (07/18/2023)   Hunger Vital Sign    Worried About Running Out of Food in the Last Year: Never true    Ran Out of Food in the Last Year: Never true  Transportation Needs: No Transportation Needs (07/18/2023)   PRAPARE - Administrator, Civil Service (Medical): No    Lack of Transportation (Non-Medical): No  Physical Activity: Sufficiently Active (07/18/2023)    Exercise Vital Sign    Days of Exercise per Week: 7 days    Minutes of Exercise per Session: 60 min  Stress: No Stress Concern Present (02/26/2023)   [employer] of Occupational Health - Occupational Stress Questionnaire    Feeling of Stress : Not at all  Social Connections: Socially Integrated (07/18/2023)   Social Connection and Isolation Panel [NHANES]    Frequency of Communication with Friends and Family: More than three times a week    Frequency of Social Gatherings with Friends and Family: More than three times a  week    Attends Religious Services: More than 4 times per year    Active Member of Clubs or Organizations: Yes    Attends Banker Meetings: More than 4 times per year    Marital Status: Married  Catering manager Violence: Not At Risk (07/18/2023)   Humiliation, Afraid, Rape, and Kick questionnaire    Fear of Current or Ex-Partner: No    Emotionally Abused: No    Physically Abused: No    Sexually Abused: No   Family History  Problem Relation Age of Onset   Colon polyps Mother    Rheum arthritis Sister    Breast cancer Maternal Aunt    Breast cancer Maternal Aunt    Colon cancer Maternal Grandmother        age 72   CAD Brother     Objective: Office vital signs reviewed. BP 119/78   Pulse 75   Temp 98.7 F (37.1 C)   Ht 5\' 2"  (1.575 m)   Wt 194 lb (88 kg)   SpO2 97%   BMI 35.48 kg/m   Physical Examination:  General: Awake, alert, well nourished, No acute distress HEENT: Sclera white.  Moist mucous membranes.  No exophthalamos Cardio: regular rate and rhythm, S1S2 heard, no murmurs appreciated Pulm: clear to auscultation bilaterally, no wheezes, rhonchi or rales; normal work of breathing on room air    08/30/2023   11:37 AM 05/08/2023    9:42 AM 05/06/2023    9:13 AM  Depression screen PHQ 2/9  Decreased Interest 0 0 0  Down, Depressed, Hopeless 1 0 0  PHQ - 2 Score 1 0 0  Altered sleeping 1 1 1   Tired, decreased energy 1 0 0   Change in appetite 1 0 0  Feeling bad or failure about yourself  1 0 0  Trouble concentrating 0 1 1  Moving slowly or fidgety/restless 0 0 0  Suicidal thoughts 0 0 0  PHQ-9 Score 5 2 2   Difficult doing work/chores Somewhat difficult Not difficult at all Very difficult      08/30/2023   11:37 AM 05/08/2023    9:43 AM 05/06/2023    9:13 AM 01/07/2023   11:45 AM  GAD 7 : Generalized Anxiety Score  Nervous, Anxious, on Edge 0 0 0 0  Control/stop worrying 0 0 0 0  Worry too much - different things 0 0 0 0  Trouble relaxing 0 1 1 0  Restless 0 1 1 0  Easily annoyed or irritable 0 0 0 0  Afraid - awful might happen 0 0 0 0  Total GAD 7 Score 0 2 2 0  Anxiety Difficulty Not difficult at all Somewhat difficult Very difficult     Assessment/ Plan: 68 y.o. female   Type 2 diabetes mellitus with other specified complication, with long-term current use of insulin (HCC) - Plan: Bayer DCA Hb A1c Waived, CMP14+EGFR  Long-term current use of injectable noninsulin antidiabetic medication  Diabetic polyneuropathy associated with type 2 diabetes mellitus (HCC)  Hyperlipidemia associated with type 2 diabetes mellitus (HCC) - Plan: CMP14+EGFR  Acquired hypothyroidism - Plan: TSH + free T4  Concentration deficit - Plan: buPROPion (WELLBUTRIN SR) 150 MG 12 hr tablet  Low mean corpuscular volume (MCV) - Plan: Anemia Profile B  Chronic fatigue - Plan: Anemia Profile B  Check A1c, CMP.  Continue current regimen.  Neuropathy is chronic and stable  Check liver enzymes.  Continue statin  Check thyroid levels.  She is having  some decreased energy and I am not sure if this is from a thyroid mediated etiology or something else.  Since she has concentration deficit and mildly elevated PHQ we will start Wellbutrin.  I would like to reevaluate her again in 1 month.  We discussed consideration for sleep study.  May need to consider something like doxepin for sleep aid/postherpetic neuralgia  treatment  I will also check an anemia profile given chronic fatigue and history of low MCV  Mickle Campton Hulen Skains, DO [facility] [phone]

## 2023-08-31 LAB — CMP14+EGFR
ALT: 18 [IU]/L (ref 0–32)
AST: 20 [IU]/L (ref 0–40)
Albumin: 4 g/dL (ref 3.9–4.9)
Alkaline Phosphatase: 145 [IU]/L — ABNORMAL HIGH (ref 44–121)
BUN/Creatinine Ratio: 20 (ref 12–28)
BUN: 15 mg/dL (ref 8–27)
Bilirubin Total: <0.2 mg/dL (ref 0.0–1.2)
CO2: 20 mmol/L (ref 20–29)
Calcium: 8.9 mg/dL (ref 8.7–10.3)
Chloride: 100 mmol/L (ref 96–106)
Creatinine, Ser: 0.76 mg/dL (ref 0.57–1.00)
Globulin, Total: 2.8 g/dL (ref 1.5–4.5)
Glucose: 146 mg/dL — ABNORMAL HIGH (ref 70–99)
Potassium: 4.1 mmol/L (ref 3.5–5.2)
Sodium: 134 mmol/L (ref 134–144)
Total Protein: 6.8 g/dL (ref 6.0–8.5)
eGFR: 86 mL/min/{1.73_m2} (ref >59)

## 2023-08-31 LAB — ANEMIA PROFILE B
Basophils Absolute: 0 10*3/uL (ref 0.0–0.2)
Basos: 1 %
EOS (ABSOLUTE): 0 10*3/uL (ref 0.0–0.4)
Eos: 0 %
Ferritin: 47 ng/mL (ref 15–150)
Folate: 7.7 ng/mL (ref >3.0)
Hematocrit: 41 % (ref 34.0–46.6)
Hemoglobin: 12.4 g/dL (ref 11.1–15.9)
Immature Grans (Abs): 0 10*3/uL (ref 0.0–0.1)
Immature Granulocytes: 0 %
Iron Saturation: 32 % (ref 15–55)
Iron: 97 ug/dL (ref 27–139)
Lymphocytes Absolute: 2.7 10*3/uL (ref 0.7–3.1)
Lymphs: 53 %
MCH: 20.9 pg — ABNORMAL LOW (ref 26.6–33.0)
MCHC: 30.2 g/dL — ABNORMAL LOW (ref 31.5–35.7)
MCV: 69 fL — ABNORMAL LOW (ref 79–97)
Monocytes Absolute: 0.8 10*3/uL (ref 0.1–0.9)
Monocytes: 15 %
Neutrophils Absolute: 1.6 10*3/uL (ref 1.4–7.0)
Neutrophils: 31 %
Platelets: 286 10*3/uL (ref 150–450)
RBC: 5.93 x10E6/uL — ABNORMAL HIGH (ref 3.77–5.28)
RDW: 17.4 % — ABNORMAL HIGH (ref 11.7–15.4)
Retic Ct Pct: 0.7 % (ref 0.6–2.6)
Total Iron Binding Capacity: 300 ug/dL (ref 250–450)
UIBC: 203 ug/dL (ref 118–369)
Vitamin B-12: 1084 pg/mL (ref 232–1245)
WBC: 5.1 10*3/uL (ref 3.4–10.8)

## 2023-08-31 LAB — TSH+FREE T4
Free T4: 1.46 ng/dL (ref 0.82–1.77)
TSH: 1.8 u[IU]/mL (ref 0.450–4.500)

## 2023-09-02 ENCOUNTER — Encounter: Payer: Self-pay | Admitting: Family Medicine

## 2023-09-02 LAB — BAYER DCA HB A1C WAIVED: HB A1C (BAYER DCA - WAIVED): 7.7 % — ABNORMAL HIGH (ref 4.8–5.6)

## 2023-09-09 ENCOUNTER — Other Ambulatory Visit: Payer: Self-pay | Admitting: *Deleted

## 2023-09-09 NOTE — Patient Outreach (Signed)
 Care Management   Visit Note  09/09/2023 Name: Melissa Schneider MRN: [id number] DOB: Jan 06, 1956  Subjective: Melissa Schneider is a 68 y.o. year old female who is a primary care patient of Melissa Ip, DO. The Care Management team was consulted for assistance.      Engaged with patient spoke with patient by telephone.    Goals Addressed             This Visit's Progress    RNCM Care Management  (HYPERTENSION) EXPECTED OUTCOME: MONITOR, SELF-MANAGE AND REDUCE SYMPTOMS OF HYPERTENSION       Current Barriers:  Knowledge Deficits related to Hypertension management Care Coordination needs related to pharmacy, medication in a patient with Hypertension Chronic Disease Management support and education needs related to Hypertension No Advanced Directives in place- pt reports she has documents on hand to complete Patient reports she lives with spouse, is independent with all aspects of her care, drives, volunteers [id number], walks daily for 1.5-3 miles Patient reports she checks blood pressure once per week and states "readings are good" Patient does still have occasional migrane and reports Imitrex is helping her manage BP Readings from Last 3 Encounters:  08/30/23 119/78  05/13/23 127/65  05/08/23 128/82     Planned Interventions: Evaluation of current treatment plan related to hypertension self management and patient's adherence to plan as established by provider. Checks BP on occasion, RNCM recommended to keep log. RNCM reviewed goals of SBP <140, DBP<90. Denies any chest pain, swelling, dizziness or headache. Reviewed medications with patient and discussed importance of compliance; Reports compliance with all medications Counseled on the importance of exercise goals with target of 150 minutes per week.  Advised patient, providing education and rationale, to monitor blood pressure daily and record, calling PCP for findings outside established parameters. Does not check daily  but does check often and record findings. Reviewed low sodium diet  Symptom Management: Take medications as prescribed   Attend all scheduled provider appointments Call pharmacy for medication refills 3-7 days in advance of running out of medications Attend church or other social activities Perform all self care activities independently  Perform IADL's (shopping, preparing meals, housekeeping, managing finances) independently Call provider office for new concerns or questions  check blood pressure weekly choose a place to take my blood pressure (home, clinic or office, retail store) write blood pressure results in a log or diary keep a blood pressure log take blood pressure log to all doctor appointments keep all doctor appointments take medications for blood pressure exactly as prescribed report new symptoms to your doctor eat more whole grains, fruits and vegetables, lean meats and healthy fats Continue walking daily - keep up the good work! Follow low sodium diet Read food labels for sodium content Limit/ avoid fast food  Follow Up Plan: Telephone follow up appointment with care management team member scheduled for:   10-07-2023 at 1:00 pm       RNCM Care Mangement:  (DIABETES) EXPECTED OUTCOME: MONITOR, SELF-MANAGE AND REDUCE SYMPTOMS OF DIABETES       Current Barriers:  Knowledge Deficits related to Diabetes management Chronic Disease Management support and education needs related to Diabetes, ciet No Advanced Directives in place- pt has documents on hand   Lab Results  Component Value Date   HGBA1C 7.7 (H) 08/30/2023    Planned Interventions: Provided education to patient about basic DM disease process. Reports fasting of 120 this morning. Lowest reading 50s and highest 250 which she contributes  to holiday season. She is actively working to get back on track. RNCM reviewed fasting goal of <130 and post prandial of <180. Reviewed medications with patient and discussed  importance of medication adherence. Compliant with all medications Counseled on importance of regular laboratory monitoring as prescribed. Has labs on a regular basis, labs are up to date;        Discussed plans with patient for ongoing care management follow up and provided patient with direct contact information for care management team;      Advised patient, providing education and rationale, to check cbg per CGM  and record. Has a freestyle libre and keeps good check on her blood sugar readings.        call provider for findings outside established parameters;       Review of patient status, including review of consultants reports, relevant laboratory and other test results, and medications completed;       Reinforced carbohydrate modified diet and food choices  Symptom Management: Take medications as prescribed   Attend all scheduled provider appointments Call pharmacy for medication refills 3-7 days in advance of running out of medications Attend church or other social activities Perform all self care activities independently  Perform IADL's (shopping, preparing meals, housekeeping, managing finances) independently Call provider office for new concerns or questions  check blood sugar at prescribed times: per [facility]  check feet daily for cuts, sores or redness enter blood sugar readings and medication or insulin into daily log take the blood sugar log to all doctor visits take the blood sugar meter to all doctor visits trim toenails straight across fill half of plate with vegetables limit fast food meals to no more than 1 per week manage portion size prepare main meal at home 3 to 5 days each week read food labels for fat, fiber, carbohydrates and portion size set a realistic goal Follow RULE OF 15 for low blood sugar management:  How to treat low blood sugars (Blood sugar less than 70 mg/dl  Please follow the RULE OF 15 for the treatment of hypoglycemia treatment (When  your blood sugars are less than 70 mg/ dl) STEP  1:  Take 15 grams of carbohydrates when your blood sugar is low, which includes:   3-4 glucose tabs or  3-4 oz of juice or regular soda or  One tube of glucose gel STEP 2:  Recheck blood sugar in 15 minutes STEP 3:  If your blood sugar is still low at the 15 minute recheck ---then, go back to STEP 1 and treat again with another 15 grams of carbohydrates  Follow Up Plan: Telephone follow up appointment with care management team member scheduled for:   10-07-2023 at 1:00 pm             Consent to Services:  Patient was given information about care management services, agreed to services, and gave verbal consent to participate.   Plan: Telephone follow up appointment with care management team member scheduled for:10-07-2023 at 1:00 pm  Larey Brick, BSN RN [facility], [facility] RN Care Manager Direct Dial: [phone]  Fax: [phone]

## 2023-09-09 NOTE — Patient Instructions (Signed)
 Visit Information  Thank you for taking time to visit with me today. Please don't hesitate to contact me if I can be of assistance to you before our next scheduled telephone appointment.  Following are the goals we discussed today:   Goals Addressed             This Visit's Progress    RNCM Care Management  (HYPERTENSION) EXPECTED OUTCOME: MONITOR, SELF-MANAGE AND REDUCE SYMPTOMS OF HYPERTENSION       Current Barriers:  Knowledge Deficits related to Hypertension management Care Coordination needs related to pharmacy, medication in a patient with Hypertension Chronic Disease Management support and education needs related to Hypertension No Advanced Directives in place- pt reports she has documents on hand to complete Patient reports she lives with spouse, is independent with all aspects of her care, drives, volunteers [address], walks daily for 1.5-3 miles Patient reports she checks blood pressure once per week and states "readings are good" Patient does still have occasional migrane and reports Imitrex is helping her manage BP Readings from Last 3 Encounters:  08/30/23 119/78  05/13/23 127/65  05/08/23 128/82     Planned Interventions: Evaluation of current treatment plan related to hypertension self management and patient's adherence to plan as established by provider. Checks BP on occasion, RNCM recommended to keep log. RNCM reviewed goals of SBP <140, DBP<90. Denies any chest pain, swelling, dizziness or headache. Reviewed medications with patient and discussed importance of compliance; Reports compliance with all medications Counseled on the importance of exercise goals with target of 150 minutes per week.  Advised patient, providing education and rationale, to monitor blood pressure daily and record, calling PCP for findings outside established parameters. Does not check daily but does check often and record findings. Reviewed low sodium diet  Symptom Management: Take  medications as prescribed   Attend all scheduled provider appointments Call pharmacy for medication refills 3-7 days in advance of running out of medications Attend church or other social activities Perform all self care activities independently  Perform IADL's (shopping, preparing meals, housekeeping, managing finances) independently Call provider office for new concerns or questions  check blood pressure weekly choose a place to take my blood pressure (home, clinic or office, retail store) write blood pressure results in a log or diary keep a blood pressure log take blood pressure log to all doctor appointments keep all doctor appointments take medications for blood pressure exactly as prescribed report new symptoms to your doctor eat more whole grains, fruits and vegetables, lean meats and healthy fats Continue walking daily - keep up the good work! Follow low sodium diet Read food labels for sodium content Limit/ avoid fast food  Follow Up Plan: Telephone follow up appointment with care management team member scheduled for:   10-07-2023 at 1:00 pm       RNCM Care Mangement:  (DIABETES) EXPECTED OUTCOME: MONITOR, SELF-MANAGE AND REDUCE SYMPTOMS OF DIABETES       Current Barriers:  Knowledge Deficits related to Diabetes management Chronic Disease Management support and education needs related to Diabetes, ciet No Advanced Directives in place- pt has documents on hand   Lab Results  Component Value Date   HGBA1C 7.7 (H) 08/30/2023    Planned Interventions: Provided education to patient about basic DM disease process. Reports fasting of 120 this morning. Lowest reading 50s and highest 250 which she contributes to holiday season. She is actively working to get back on track. RNCM reviewed fasting goal of <130 and post  prandial of <180. Reviewed medications with patient and discussed importance of medication adherence. Compliant with all medications Counseled on importance of  regular laboratory monitoring as prescribed. Has labs on a regular basis, labs are up to date;        Discussed plans with patient for ongoing care management follow up and provided patient with direct contact information for care management team;      Advised patient, providing education and rationale, to check cbg per CGM  and record. Has a freestyle libre and keeps good check on her blood sugar readings.        call provider for findings outside established parameters;       Review of patient status, including review of consultants reports, relevant laboratory and other test results, and medications completed;       Reinforced carbohydrate modified diet and food choices  Symptom Management: Take medications as prescribed   Attend all scheduled provider appointments Call pharmacy for medication refills 3-7 days in advance of running out of medications Attend church or other social activities Perform all self care activities independently  Perform IADL's (shopping, preparing meals, housekeeping, managing finances) independently Call provider office for new concerns or questions  check blood sugar at prescribed times: per [facility]  check feet daily for cuts, sores or redness enter blood sugar readings and medication or insulin into daily log take the blood sugar log to all doctor visits take the blood sugar meter to all doctor visits trim toenails straight across fill half of plate with vegetables limit fast food meals to no more than 1 per week manage portion size prepare main meal at home 3 to 5 days each week read food labels for fat, fiber, carbohydrates and portion size set a realistic goal Follow RULE OF 15 for low blood sugar management:  How to treat low blood sugars (Blood sugar less than 70 mg/dl  Please follow the RULE OF 15 for the treatment of hypoglycemia treatment (When your blood sugars are less than 70 mg/ dl) STEP  1:  Take 15 grams of carbohydrates when your  blood sugar is low, which includes:   3-4 glucose tabs or  3-4 oz of juice or regular soda or  One tube of glucose gel STEP 2:  Recheck blood sugar in 15 minutes STEP 3:  If your blood sugar is still low at the 15 minute recheck ---then, go back to STEP 1 and treat again with another 15 grams of carbohydrates  Follow Up Plan: Telephone follow up appointment with care management team member scheduled for:   10-07-2023 at 1:00 pm           Our next appointment is by telephone on 10-07-2023 at 1:00 pm  Please call the care guide team at [phone] if you need to cancel or reschedule your appointment.   If you are experiencing a Mental Health or Behavioral Health Crisis or need someone to talk to, please call the Suicide and Crisis Lifeline: 988 call the [place] National Suicide Prevention Lifeline: (407)523-2245 or TTY: (617) 759-7930 TTY 507-270-7085) to talk to a trained counselor call 1-800-273-TALK (toll free, 24 hour hotline)   Patient verbalizes understanding of instructions and care plan provided today and agrees to view in MyChart. Active MyChart status and patient understanding of how to access instructions and care plan via MyChart confirmed with patient.     Telephone follow up appointment with care management team member scheduled for:10-07-2023 at 1:00 pm  Larey Brick, BSN RN  [facility]  [facility], [facility] RN Care Manager Direct Dial: [phone]  Fax: [phone]   Diabetes Mellitus and Nutrition, Adult When you have diabetes, or diabetes mellitus, it is very important to have healthy eating habits because your blood sugar (glucose) levels are greatly affected by what you eat and drink. Eating healthy foods in the right amounts, at about the same times every day, can help you: Manage your blood glucose. Lower your risk of heart disease. Improve your blood pressure. Reach or maintain a healthy weight. What can affect my meal plan? Every  person with diabetes is different, and each person has different needs for a meal plan. Your health care provider may recommend that you work with a dietitian to make a meal plan that is best for you. Your meal plan may vary depending on factors such as: The calories you need. The medicines you take. Your weight. Your blood glucose, blood pressure, and cholesterol levels. Your activity level. Other health conditions you have, such as heart or kidney disease. How do carbohydrates affect me? Carbohydrates, also called carbs, affect your blood glucose level more than any other type of food. Eating carbs raises the amount of glucose in your blood. It is important to know how many carbs you can safely have in each meal. This is different for every person. Your dietitian can help you calculate how many carbs you should have at each meal and for each snack. How does alcohol affect me? Alcohol can cause a decrease in blood glucose (hypoglycemia), especially if you use insulin or take certain diabetes medicines by mouth. Hypoglycemia can be a life-threatening condition. Symptoms of hypoglycemia, such as sleepiness, dizziness, and confusion, are similar to symptoms of having too much alcohol. Do not drink alcohol if: Your health care provider tells you not to drink. You are pregnant, may be pregnant, or are planning to become pregnant. If you drink alcohol: Limit how much you have to: 0-1 drink a day for women. 0-2 drinks a day for men. Know how much alcohol is in your drink. In the [place] one drink equals one 12 oz bottle of beer (355 mL), one 5 oz glass of wine (148 mL), or one 1 oz glass of hard liquor (44 mL). Keep yourself hydrated with water, diet soda, or unsweetened iced tea. Keep in mind that regular soda, juice, and other mixers may contain a lot of sugar and must be counted as carbs. What are tips for following this plan?  Reading food labels Start by checking the serving size on the  Nutrition Facts label of packaged foods and drinks. The number of calories and the amount of carbs, fats, and other nutrients listed on the label are based on one serving of the item. Many items contain more than one serving per package. Check the total grams (g) of carbs in one serving. Check the number of grams of saturated fats and trans fats in one serving. Choose foods that have a low amount or none of these fats. Check the number of milligrams (mg) of salt (sodium) in one serving. Most people should limit total sodium intake to less than 2,300 mg per day. Always check the nutrition information of foods labeled as "low-fat" or "nonfat." These foods may be higher in added sugar or refined carbs and should be avoided. Talk to your dietitian to identify your daily goals for nutrients listed on the label. Shopping Avoid buying canned, pre-made, or processed foods. These foods tend to be  high in fat, sodium, and added sugar. Shop around the outside edge of the grocery store. This is where you will most often find fresh fruits and vegetables, bulk grains, fresh meats, and fresh dairy products. Cooking Use low-heat cooking methods, such as baking, instead of high-heat cooking methods, such as deep frying. Cook using healthy oils, such as olive, canola, or sunflower oil. Avoid cooking with butter, cream, or high-fat meats. Meal planning Eat meals and snacks regularly, preferably at the same times every day. Avoid going long periods of time without eating. Eat foods that are high in fiber, such as fresh fruits, vegetables, beans, and whole grains. Eat 4-6 oz (112-168 g) of lean protein each day, such as lean meat, chicken, fish, eggs, or tofu. One ounce (oz) (28 g) of lean protein is equal to: 1 oz (28 g) of meat, chicken, or fish. 1 egg.  cup (62 g) of tofu. Eat some foods each day that contain healthy fats, such as avocado, nuts, seeds, and fish. What foods should I eat? Fruits Berries. Apples.  Oranges. Peaches. Apricots. Plums. Grapes. Mangoes. Papayas. Pomegranates. Kiwi. Cherries. Vegetables Leafy greens, including lettuce, spinach, kale, chard, collard greens, mustard greens, and cabbage. Beets. Cauliflower. Broccoli. Carrots. Green beans. Tomatoes. Peppers. Onions. Cucumbers. Brussels sprouts. Grains Whole grains, such as whole-wheat or whole-grain bread, crackers, tortillas, cereal, and pasta. Unsweetened oatmeal. Quinoa. Brown or wild rice. Meats and other proteins Seafood. Poultry without skin. Lean cuts of poultry and beef. Tofu. Nuts. Seeds. Dairy Low-fat or fat-free dairy products such as milk, yogurt, and cheese. The items listed above may not be a complete list of foods and beverages you can eat and drink. Contact a dietitian for more information. What foods should I avoid? Fruits Fruits canned with syrup. Vegetables Canned vegetables. Frozen vegetables with butter or cream sauce. Grains Refined white flour and flour products such as bread, pasta, snack foods, and cereals. Avoid all processed foods. Meats and other proteins Fatty cuts of meat. Poultry with skin. Breaded or fried meats. Processed meat. Avoid saturated fats. Dairy Full-fat yogurt, cheese, or milk. Beverages Sweetened drinks, such as soda or iced tea. The items listed above may not be a complete list of foods and beverages you should avoid. Contact a dietitian for more information. Questions to ask a health care provider Do I need to meet with a certified diabetes care and education specialist? Do I need to meet with a dietitian? What number can I call if I have questions? When are the best times to check my blood glucose? Where to find more information: American Diabetes Association: diabetes.org Academy of Nutrition and Dietetics: eatright.[employer] of Diabetes and Digestive and Kidney Diseases: StageSync.si Association of Diabetes Care & Education Specialists:  diabeteseducator.org Summary It is important to have healthy eating habits because your blood sugar (glucose) levels are greatly affected by what you eat and drink. It is important to use alcohol carefully. A healthy meal plan will help you manage your blood glucose and lower your risk of heart disease. Your health care provider may recommend that you work with a dietitian to make a meal plan that is best for you. This information is not intended to replace advice given to you by your health care provider. Make sure you discuss any questions you have with your health care provider. Document Revised: 02/02/2020 Document Reviewed: 02/03/2020 Elsevier Patient Education  2024 [employer].

## 2023-09-11 ENCOUNTER — Encounter (HOSPITAL_COMMUNITY): Payer: Self-pay

## 2023-09-11 ENCOUNTER — Emergency Department (HOSPITAL_COMMUNITY)
Admission: EM | Admit: 2023-09-11 | Discharge: 2023-09-11 | Disposition: A | Discharge status: Home / Self Care | Payer: PPO | Attending: Student | Admitting: Student

## 2023-09-11 ENCOUNTER — Other Ambulatory Visit: Payer: Self-pay

## 2023-09-11 ENCOUNTER — Ambulatory Visit: Payer: Self-pay | Admitting: Family Medicine

## 2023-09-11 DIAGNOSIS — Z7982 Long term (current) use of aspirin: Secondary | ICD-10-CM | POA: Insufficient documentation

## 2023-09-11 DIAGNOSIS — E039 Hypothyroidism, unspecified: Secondary | ICD-10-CM | POA: Diagnosis not present

## 2023-09-11 DIAGNOSIS — Z794 Long term (current) use of insulin: Secondary | ICD-10-CM | POA: Insufficient documentation

## 2023-09-11 DIAGNOSIS — Z79899 Other long term (current) drug therapy: Secondary | ICD-10-CM | POA: Diagnosis not present

## 2023-09-11 DIAGNOSIS — H5712 Ocular pain, left eye: Secondary | ICD-10-CM | POA: Diagnosis not present

## 2023-09-11 DIAGNOSIS — E119 Type 2 diabetes mellitus without complications: Secondary | ICD-10-CM | POA: Diagnosis not present

## 2023-09-11 MED ORDER — FLUORESCEIN SODIUM 1 MG OP STRP
1.0000 | ORAL_STRIP | Freq: Once | OPHTHALMIC | Status: AC
  Administered 2023-09-11: 1 via OPHTHALMIC
  Filled 2023-09-11: qty 1

## 2023-09-11 MED ORDER — TETRACAINE HCL 0.5 % OP SOLN
1.0000 [drp] | Freq: Once | OPHTHALMIC | Status: AC
  Administered 2023-09-11: 1 [drp] via OPHTHALMIC
  Filled 2023-09-11: qty 4

## 2023-09-11 MED ORDER — ERYTHROMYCIN 5 MG/GM OP OINT
TOPICAL_OINTMENT | Freq: Once | OPHTHALMIC | Status: AC
  Administered 2023-09-11: 1 via OPHTHALMIC
  Filled 2023-09-11: qty 3.5

## 2023-09-11 MED ORDER — ERYTHROMYCIN 5 MG/GM OP OINT: 1.0000 | TOPICAL_OINTMENT | Freq: Four times a day (QID) | OPHTHALMIC | 0 refills | Status: AC

## 2023-09-11 NOTE — Discharge Instructions (Addendum)
 You were evaluated in the ER today for sharp pain in your left eye.  This got better after using the topical numbing drops.  This likely related to an injury to your cornea.  Your exam was overall very reassuring but we agreed to prescribe you some topical antibiotic ointment.  It is important for you to follow-up with the eye doctor.  Come back to the ER if you have new or worsening symptoms.

## 2023-09-11 NOTE — Telephone Encounter (Signed)
 Copied from CRM [id number]. Topic: Clinical - Red Word Triage >> Sep 11, 2023  9:10 AM Prudencio Pair wrote: Red Word that prompted transfer to Nurse Triage: Patient states she has a left droopy eyelid & there is pain behind her eye. Concerned because she says it can be symptoms of many things & she wants to make sure she doesn't need to go to the hospital. Wants to be seen today, if possible.   Chief Complaint: Droopy Eyelid with Pain Symptoms: Blurred Vision, Parasthesia Frequency: Acute Pertinent Negatives: Patient denies numbness, tingling,  Disposition: [x] ED /[] Urgent Care (no appt availability in office) / [] Appointment(In office/virtual)/ []  [facility]/ [] Home Care/ [] Refused Recommended Disposition /[] [facility]/ []  Follow-up with PCP Additional Notes: CJ called in regarding a droopy eyelid on the left side and pain behind the eye. The patient has a history of migraines, hypertension, and hyperlipidemia. The patient also reports having intermittent chest pain throughout the week along with transient tingling of the upper extremities. Due to history and presentation, recommended the patient go to the ER for further evaluation and treatment. The patient states she currently takes medications for her blood pressure and cholesterol along with headaches. Instructed patient to call back if she felt as if she could not drive to the ER or felt unsafe as she is alone, due to her husband being at work.   Reason for Disposition  [1] Numbness (i.e., loss of sensation) of the face, arm / hand, or leg / foot on one side of the body AND [2] sudden onset AND [3] brief (now gone)  Answer Assessment - Initial Assessment Questions 1. SYMPTOM: "What is the main symptom you are concerned about?" (e.g., weakness, numbness)     Drooping eyelid, left, pain behind  2. ONSET: "When did this start?" (minutes, hours, days; while sleeping)     A few days ago  3. LAST NORMAL: "When was the last  time you (the patient) were normal (no symptoms)?"     Friday  4. PATTERN "Does this come and go, or has it been constant since it started?"  "Is it present now?"     Constant  5. CARDIAC SYMPTOMS: "Have you had any of the following symptoms: chest pain, difficulty breathing, palpitations?"     Intermittent Chest Pain, Hypertension, Hyperlipidemia  6. NEUROLOGIC SYMPTOMS: "Have you had any of the following symptoms: headache, dizziness, vision loss, double vision, changes in speech, unsteady on your feet?"     Blurred Vision, History of cataracts,   7. OTHER SYMPTOMS: "Do you have any other symptoms?"     Forgetful  8. PREGNANCY: "Is there any chance you are pregnant?" "When was your last menstrual period?"     No and No  Protocols used: Neurologic Deficit-A-AH

## 2023-09-11 NOTE — ED Provider Notes (Signed)
 [place] EMERGENCY DEPARTMENT AT [facility] Provider Note   CSN: [id number] Arrival date & time: 09/11/23  1059     History  Chief Complaint  Patient presents with   Eye Pain    Melissa Schneider is a 68 y.o. female.  PMH of type 2 diabetes, gastroparesis, hypothyroidism, kidney stone, migraines.  Presents the ER complaining of sharp pain in her left eye when she moves her eye around, started yesterday.  She states that several days ago she noticed she felt her left eyelid was drooping, she states this usually happens when she is about to get a migraine to significant migraine, the left eyelid drooping has gotten better but she had this sharp eye pain when she moves her eye around today.  Denies redness or drainage, no blurry vision, no floaters, no vision loss.  Denies trauma to the eye.  Wears glasses but not contacts.  Denies trauma to the eye.  Denies headache.  No numbness tingling or weakness.   Eye Pain       Home Medications Prior to Admission medications   Medication Sig Start Date End Date Taking? Authorizing Provider  erythromycin ophthalmic ointment Place 1 Application into the right eye 4 (four) times daily for 5 days. 09/11/23 09/16/23 Yes Rogan Ecklund A, PA-C  Aloe Vera 72 % CREA Apply 1 Application topically 2 (two) times daily as needed. Patient not taking: Reported on 09/09/2023 05/13/23   Martina Sinner, NP  aspirin 81 MG tablet Take 81 mg by mouth daily.    [provider]  buPROPion (WELLBUTRIN SR) 150 MG 12 hr tablet Take 1 tablet (150 mg total) by mouth in the morning. 08/30/23   Raliegh Ip, DO  Capsaicin-Menthol-Methyl Sal (CAPSAICIN-METHYL SAL-MENTHOL) 0.025-1-12 % CREA Apply 1 Application topically 2 (two) times daily as needed. Patient not taking: Reported on 09/09/2023 05/28/23   Martina Sinner, NP  Continuous Glucose Sensor (FREESTYLE LIBRE 3 SENSOR) MISC CHANGE EVERY 14 DAYS 05/02/23   Raliegh Ip, DO  cyclobenzaprine (FLEXERIL) 10 MG tablet Take 1 tablet (10 mg total) by mouth 3 (three) times daily as needed for muscle spasms. Patient not taking: Reported on 09/09/2023 05/08/23   Dettinger, Elige Radon, MD  docusate sodium (COLACE) 100 MG capsule Take 200 mg by mouth daily as needed.     [provider]  glucose blood (GNP EASY TOUCH GLUCOSE TEST) test strip EVERY DAY 11/23/19   Delynn Flavin M, DO  insulin aspart (NOVOLOG FLEXPEN) 100 UNIT/ML FlexPen Inject 10-17 Units into the skin 3 (three) times daily with meals. 10/03/22   Raliegh Ip, DO  insulin glargine (LANTUS) 100 UNIT/ML injection INJECT 0.22-0.3MLS (22-30 UNITS) INTO THE SKIN AT BEDTIME Patient taking differently: Inject 10 Units into the skin at bedtime. 10/03/22   Raliegh Ip, DO  Insulin Pen Needle (PEN NEEDLES) 32G X 4 MM MISC UAD E11.69 10/03/22   Delynn Flavin M, DO  Insulin Syringe-Needle U-100 (INSULIN SYRINGE .5CC/31GX5/16") 31G X 5/16" 0.5 ML MISC UAD with Lantus E11.69 10/03/22   Delynn Flavin M, DO  levocetirizine (XYZAL) 5 MG tablet Take 1 tablet (5 mg total) by mouth at bedtime as needed for allergies (itching/ rash). Patient not taking: Reported on 09/09/2023 11/17/21   Raliegh Ip, DO  levothyroxine (SYNTHROID) 137 MCG tablet TAKE ONE TABLET EACH MORNING BEFORE BREAKFAST 05/02/23   Delynn Flavin M, DO  losartan (COZAAR) 25 MG tablet TAKE ONE (1) TABLET EACH DAY  10/03/22   Raliegh Ip, DO  Multiple Vitamin (MULTIVITAMIN WITH MINERALS) TABS tablet Take 1 tablet by mouth daily.    [provider]  predniSONE (DELTASONE) 20 MG tablet 2 po at same time daily for 5 days Patient not taking: Reported on 09/09/2023 05/08/23   Dettinger, Elige Radon, MD  rosuvastatin (CRESTOR) 20 MG tablet Take 1 tablet (20 mg total) by mouth daily. 01/07/23   Raliegh Ip, DO  Semaglutide, 1 MG/DOSE, 4 MG/3ML SOPN Inject 1 mg as directed once a week. 01/07/23   Raliegh Ip,  DO  SUMAtriptan (IMITREX) 100 MG tablet May repeat in 2 hours if headache persists or recurs. 10/03/22   Raliegh Ip, DO  triamcinolone cream (KENALOG) 0.1 % Apply 1 application. topically 2 (two) times daily. X7-10 days for rash Patient not taking: Reported on 09/09/2023 11/17/21   Raliegh Ip, DO      Allergies    Codeine, Gabapentin, and Lisinopril    Review of Systems   Review of Systems  Eyes:  Positive for pain.    Physical Exam Updated Vital Signs BP (!) 144/92 (BP Location: Right Arm)   Pulse 78   Temp 98.1 F (36.7 C) (Oral)   Resp 18   Ht 5\' 2"  (1.575 m)   Wt 87.9 kg   SpO2 98%   BMI 35.44 kg/m  Physical Exam Vitals and nursing note reviewed.  Constitutional:      General: She is not in acute distress.    Appearance: She is well-developed.  HENT:     Head: Normocephalic and atraumatic.  Eyes:     General: Lids are normal. No visual field deficit.       Left eye: No foreign body, discharge or hordeolum.     Intraocular pressure: Left eye pressure is 22 mmHg. Measurements were taken using a handheld tonometer.    Extraocular Movements: Extraocular movements intact.     Right eye: Normal extraocular motion and no nystagmus.     Left eye: Normal extraocular motion and no nystagmus.     Conjunctiva/sclera: Conjunctivae normal.     Right eye: Right conjunctiva is not injected. No chemosis, exudate or hemorrhage.    Left eye: Left conjunctiva is not injected. No exudate or hemorrhage.    Pupils: Pupils are equal, round, and reactive to light.     Left eye: No corneal abrasion or fluorescein uptake. Seidel exam negative.    Slit lamp exam:    Left eye: No corneal flare, corneal ulcer or photophobia.  Cardiovascular:     Rate and Rhythm: Normal rate and regular rhythm.     Heart sounds: No murmur heard. Pulmonary:     Effort: Pulmonary effort is normal. No respiratory distress.     Breath sounds: Normal breath sounds.  Abdominal:     Palpations:  Abdomen is soft.     Tenderness: There is no abdominal tenderness.  Musculoskeletal:        General: No swelling.     Cervical back: Neck supple.  Skin:    General: Skin is warm and dry.     Capillary Refill: Capillary refill takes less than 2 seconds.  Neurological:     Mental Status: She is alert.  Psychiatric:        Mood and Affect: Mood normal.     ED Results / Procedures / Treatments   Labs (all labs ordered are listed, but only abnormal results are displayed) Labs Reviewed - No data  to display  EKG None  Radiology No results found.  Procedures Procedures    Medications Ordered in ED Medications  tetracaine (PONTOCAINE) 0.5 % ophthalmic solution 1 drop (1 drop Left Eye Given by Other 09/11/23 1330)  fluorescein ophthalmic strip 1 strip (1 strip Left Eye Given by Other 09/11/23 1330)  erythromycin ophthalmic ointment (1 Application Left Eye Given 09/11/23 1457)    ED Course/ Medical Decision Making/ A&P                                 Medical Decision Making Differential diagnosis includes but not limited to corneal abrasion, corneal ulcer, zoster ophthalmicus, iritis, glaucoma, periorbital cellulitis, other  ED course: Patient was sent to the ED for evaluation of left eye pain that is sharp.  Looking at the telephone call she had mentioned drooping eyelid and they told to come to the ER and get a scan for the patient.  She has no neurologic deficits on exam, she has a sharp eye pain with movement of her eye that fully resolved with tetracaine drops she has no vision loss or deficits.  No concern for retinal or vitreous detachment, no concern for, given her normal intraocular pressure.  Discussed with patient this is likely a corneal problem given that she had full resolution with the tetracaine drops, will prescribe erythromycin ointment and have her follow-up closely with ophthalmology.  She had no neurologic deficit on exam, had some minor eyelid drooping several days  ago but she states she has had this in the past usually precedes her migraines and she has history of Bell's palsy.  Symptoms today not consistent with stroke.  No symptoms consistent with optic neuritis.  I do not feel any imaging is indicated at this time.  Risk Prescription drug management.           Final Clinical Impression(s) / ED Diagnoses Final diagnoses:  Left eye pain    Rx / DC Orders ED Discharge Orders          Ordered    erythromycin ophthalmic ointment  4 times daily        09/11/23 [address] 09/11/23 1508    Glendora Score, MD 09/12/23 1217

## 2023-09-11 NOTE — ED Triage Notes (Signed)
 Pt arrived via POV from home endorsing left sharp eye pain, and endorses Hx of migraines, but reports she is currently not experiencing a migraine.

## 2023-09-13 DIAGNOSIS — S0502XA Injury of conjunctiva and corneal abrasion without foreign body, left eye, initial encounter: Secondary | ICD-10-CM | POA: Diagnosis not present

## 2023-09-19 ENCOUNTER — Emergency Department (HOSPITAL_COMMUNITY)

## 2023-09-19 ENCOUNTER — Other Ambulatory Visit: Payer: Self-pay

## 2023-09-19 ENCOUNTER — Emergency Department (HOSPITAL_COMMUNITY)
Admission: EM | Admit: 2023-09-19 | Discharge: 2023-09-20 | Disposition: A | Discharge status: Home / Self Care | Attending: Student | Admitting: Student

## 2023-09-19 ENCOUNTER — Encounter (HOSPITAL_COMMUNITY): Payer: Self-pay

## 2023-09-19 DIAGNOSIS — R079 Chest pain, unspecified: Secondary | ICD-10-CM | POA: Diagnosis not present

## 2023-09-19 DIAGNOSIS — R9389 Abnormal findings on diagnostic imaging of other specified body structures: Secondary | ICD-10-CM | POA: Diagnosis not present

## 2023-09-19 DIAGNOSIS — Z794 Long term (current) use of insulin: Secondary | ICD-10-CM | POA: Insufficient documentation

## 2023-09-19 DIAGNOSIS — R0602 Shortness of breath: Secondary | ICD-10-CM | POA: Diagnosis not present

## 2023-09-19 DIAGNOSIS — R0789 Other chest pain: Secondary | ICD-10-CM | POA: Diagnosis not present

## 2023-09-19 DIAGNOSIS — I119 Hypertensive heart disease without heart failure: Secondary | ICD-10-CM | POA: Diagnosis not present

## 2023-09-19 DIAGNOSIS — E119 Type 2 diabetes mellitus without complications: Secondary | ICD-10-CM | POA: Diagnosis not present

## 2023-09-19 DIAGNOSIS — I2510 Atherosclerotic heart disease of native coronary artery without angina pectoris: Secondary | ICD-10-CM | POA: Diagnosis not present

## 2023-09-19 DIAGNOSIS — Z79899 Other long term (current) drug therapy: Secondary | ICD-10-CM | POA: Diagnosis not present

## 2023-09-19 DIAGNOSIS — R110 Nausea: Secondary | ICD-10-CM | POA: Diagnosis not present

## 2023-09-19 DIAGNOSIS — R911 Solitary pulmonary nodule: Secondary | ICD-10-CM | POA: Diagnosis not present

## 2023-09-19 DIAGNOSIS — Z7982 Long term (current) use of aspirin: Secondary | ICD-10-CM | POA: Diagnosis not present

## 2023-09-19 DIAGNOSIS — E039 Hypothyroidism, unspecified: Secondary | ICD-10-CM | POA: Insufficient documentation

## 2023-09-19 DIAGNOSIS — I10 Essential (primary) hypertension: Secondary | ICD-10-CM | POA: Insufficient documentation

## 2023-09-19 DIAGNOSIS — E785 Hyperlipidemia, unspecified: Secondary | ICD-10-CM | POA: Insufficient documentation

## 2023-09-19 DIAGNOSIS — R918 Other nonspecific abnormal finding of lung field: Secondary | ICD-10-CM | POA: Diagnosis not present

## 2023-09-19 DIAGNOSIS — R0902 Hypoxemia: Secondary | ICD-10-CM | POA: Diagnosis not present

## 2023-09-19 LAB — COMPREHENSIVE METABOLIC PANEL
ALT: 22 U/L (ref 0–44)
AST: 25 U/L (ref 15–41)
Albumin: 3.8 g/dL (ref 3.5–5.0)
Alkaline Phosphatase: 95 U/L (ref 38–126)
Anion gap: 9 (ref 5–15)
BUN: 13 mg/dL (ref 8–23)
CO2: 25 mmol/L (ref 22–32)
Calcium: 9 mg/dL (ref 8.9–10.3)
Chloride: 104 mmol/L (ref 98–111)
Creatinine, Ser: 0.81 mg/dL (ref 0.44–1.00)
GFR, Estimated: >60 mL/min (ref >60)
Glucose, Bld: 130 mg/dL — ABNORMAL HIGH (ref 70–99)
Potassium: 4 mmol/L (ref 3.5–5.1)
Sodium: 138 mmol/L (ref 135–145)
Total Bilirubin: 0.4 mg/dL (ref 0.0–1.2)
Total Protein: 7.1 g/dL (ref 6.5–8.1)

## 2023-09-19 LAB — TROPONIN I (HIGH SENSITIVITY): Troponin I (High Sensitivity): <2 ng/L (ref <18)

## 2023-09-19 NOTE — ED Triage Notes (Signed)
 Pt BIB [facility] from home. Pt was getting ready for bed when she experienced left sided chest pain. Pain traveled down her left arm, shoulder, neck and back. No relief with movement or medications given by EMS. Meds given were 4 zofran, 324 aspirin, and 1 nitroglycerin. Pain rated 10/10.

## 2023-09-20 ENCOUNTER — Emergency Department (HOSPITAL_COMMUNITY)

## 2023-09-20 DIAGNOSIS — R918 Other nonspecific abnormal finding of lung field: Secondary | ICD-10-CM | POA: Diagnosis not present

## 2023-09-20 DIAGNOSIS — R079 Chest pain, unspecified: Secondary | ICD-10-CM | POA: Diagnosis not present

## 2023-09-20 DIAGNOSIS — I2510 Atherosclerotic heart disease of native coronary artery without angina pectoris: Secondary | ICD-10-CM | POA: Diagnosis not present

## 2023-09-20 DIAGNOSIS — R911 Solitary pulmonary nodule: Secondary | ICD-10-CM | POA: Diagnosis not present

## 2023-09-20 LAB — CBC WITH DIFFERENTIAL/PLATELET
Abs Immature Granulocytes: 0.02 10*3/uL (ref 0.00–0.07)
Basophils Absolute: 0.1 10*3/uL (ref 0.0–0.1)
Basophils Relative: 1 %
Eosinophils Absolute: 0.1 10*3/uL (ref 0.0–0.5)
Eosinophils Relative: 1 %
HCT: 39.1 % (ref 36.0–46.0)
Hemoglobin: 11.8 g/dL — ABNORMAL LOW (ref 12.0–15.0)
Immature Granulocytes: 0 %
Lymphocytes Relative: 34 %
Lymphs Abs: 2.9 10*3/uL (ref 0.7–4.0)
MCH: 20.3 pg — ABNORMAL LOW (ref 26.0–34.0)
MCHC: 30.2 g/dL (ref 30.0–36.0)
MCV: 67.2 fL — ABNORMAL LOW (ref 80.0–100.0)
Monocytes Absolute: 0.9 10*3/uL (ref 0.1–1.0)
Monocytes Relative: 11 %
Neutro Abs: 4.5 10*3/uL (ref 1.7–7.7)
Neutrophils Relative %: 53 %
Platelets: 222 10*3/uL (ref 150–400)
RBC: 5.82 MIL/uL — ABNORMAL HIGH (ref 3.87–5.11)
RDW: 16.8 % — ABNORMAL HIGH (ref 11.5–15.5)
WBC: 8.3 10*3/uL (ref 4.0–10.5)
nRBC: 0 % (ref 0.0–0.2)

## 2023-09-20 LAB — TROPONIN I (HIGH SENSITIVITY): Troponin I (High Sensitivity): <2 ng/L (ref <18)

## 2023-09-20 LAB — BRAIN NATRIURETIC PEPTIDE: B Natriuretic Peptide: 92 pg/mL (ref 0.0–100.0)

## 2023-09-20 MED ORDER — KETOROLAC TROMETHAMINE 15 MG/ML IJ SOLN
15.0000 mg | Freq: Once | INTRAMUSCULAR | Status: AC
  Administered 2023-09-20: 15 mg via INTRAVENOUS
  Filled 2023-09-20: qty 1

## 2023-09-20 MED ORDER — NITROGLYCERIN 0.4 MG SL SUBL
0.4000 mg | SUBLINGUAL_TABLET | Freq: Once | SUBLINGUAL | Status: AC
  Administered 2023-09-20: 0.4 mg via SUBLINGUAL
  Filled 2023-09-20: qty 1

## 2023-09-20 MED ORDER — IOHEXOL 350 MG/ML SOLN
75.0000 mL | Freq: Once | INTRAVENOUS | Status: AC | PRN
  Administered 2023-09-20: 75 mL via INTRAVENOUS

## 2023-09-20 NOTE — ED Provider Notes (Signed)
 [place] EMERGENCY DEPARTMENT AT [facility] Provider Note  CSN: [id number] Arrival date & time: 09/19/23 2305  Chief Complaint(s) Chest Pain  HPI Melissa Schneider is a 68 y.o. female with PMH anxiety, anemia, HTN, HLD, T2DM, diabetic gastroparesis who presents emergency room for evaluation of chest pain.  States that pain began suddenly when attempting to go to sleep tonight.  Left-sided chest pain radiating down the left arm and up into the shoulder and neck.  Given nitroglycerin and aspirin prior to arrival without improvement of symptoms.  Denies associated nausea and vomiting.  Denies diaphoresis or shortness of breath.  No exertional component to the pain.   Past Medical History Past Medical History:  Diagnosis Date   Anemia 07/23/2011   Anxiety    Diabetes mellitus    Diabetic gastroparesis (HCC) 07/23/2011   Suspected   Headache(784.0)    Hearing difficulty    Hyperlipidemia    Hypothyroidism    Kidney stones    Microcytic anemia 07/23/2011   Thalassemia minor    ????? not really worked up per pt but daughter has it???   Patient Active Problem List   Diagnosis Date Noted   Postherpetic neuralgia 05/28/2023   Disseminated herpes zoster 05/13/2023   Tendinitis of ankle 02/06/2023   Diabetes mellitus (HCC) 10/03/2022   Hyperlipidemia associated with type 2 diabetes mellitus (HCC) 09/01/2018   Depressed mood 09/01/2018   Low back pain 04/26/2016   Hearing problem of both ears 01/30/2016   Hypertension associated with diabetes (HCC) 11/07/2015   BMI 35.0-35.9,adult 08/11/2015   Migraine 03/17/2015   Diabetic neuropathy (HCC) 03/17/2015   Constipation 04/05/2014   Microcytic anemia 04/05/2014   Recurrent obstructive pyelonephritis 08/14/2013   Obstructive uropathy 08/13/2013   Stone in kidney 08/13/2013   Diabetic gastroparesis (HCC) 07/23/2011   Hypothyroidism 08/24/2010   DM (diabetes mellitus), type 2, uncontrolled 08/24/2010   Home Medication(s) Prior  to Admission medications   Medication Sig Start Date End Date Taking? Authorizing Provider  Aloe Vera 72 % CREA Apply 1 Application topically 2 (two) times daily as needed. Patient not taking: Reported on 09/09/2023 05/13/23   Martina Sinner, NP  aspirin 81 MG tablet Take 81 mg by mouth daily.    [provider]  buPROPion (WELLBUTRIN SR) 150 MG 12 hr tablet Take 1 tablet (150 mg total) by mouth in the morning. 08/30/23   Raliegh Ip, DO  Capsaicin-Menthol-Methyl Sal (CAPSAICIN-METHYL SAL-MENTHOL) 0.025-1-12 % CREA Apply 1 Application topically 2 (two) times daily as needed. Patient not taking: Reported on 09/09/2023 05/28/23   Martina Sinner, NP  Continuous Glucose Sensor (FREESTYLE LIBRE 3 SENSOR) MISC CHANGE EVERY 14 DAYS 05/02/23   Raliegh Ip, DO  cyclobenzaprine (FLEXERIL) 10 MG tablet Take 1 tablet (10 mg total) by mouth 3 (three) times daily as needed for muscle spasms. Patient not taking: Reported on 09/09/2023 05/08/23   Dettinger, Elige Radon, MD  docusate sodium (COLACE) 100 MG capsule Take 200 mg by mouth daily as needed.     [provider]  glucose blood (GNP EASY TOUCH GLUCOSE TEST) test strip EVERY DAY 11/23/19   Delynn Flavin M, DO  insulin aspart (NOVOLOG FLEXPEN) 100 UNIT/ML FlexPen Inject 10-17 Units into the skin 3 (three) times daily with meals. 10/03/22   Raliegh Ip, DO  insulin glargine (LANTUS) 100 UNIT/ML injection INJECT 0.22-0.3MLS (22-30 UNITS) INTO THE SKIN AT BEDTIME Patient taking differently: Inject 10 Units into the skin at bedtime.  10/03/22   Raliegh Ip, DO  Insulin Pen Needle (PEN NEEDLES) 32G X 4 MM MISC UAD E11.69 10/03/22   Delynn Flavin M, DO  Insulin Syringe-Needle U-100 (INSULIN SYRINGE .5CC/31GX5/16") 31G X 5/16" 0.5 ML MISC UAD with Lantus E11.69 10/03/22   Delynn Flavin M, DO  levocetirizine (XYZAL) 5 MG tablet Take 1 tablet (5 mg total) by mouth at bedtime as needed for allergies  (itching/ rash). Patient not taking: Reported on 09/09/2023 11/17/21   Raliegh Ip, DO  levothyroxine (SYNTHROID) 137 MCG tablet TAKE ONE TABLET EACH MORNING BEFORE BREAKFAST 05/02/23   Delynn Flavin M, DO  losartan (COZAAR) 25 MG tablet TAKE ONE (1) TABLET EACH DAY 10/03/22   Delynn Flavin M, DO  Multiple Vitamin (MULTIVITAMIN WITH MINERALS) TABS tablet Take 1 tablet by mouth daily.    [provider]  predniSONE (DELTASONE) 20 MG tablet 2 po at same time daily for 5 days Patient not taking: Reported on 09/09/2023 05/08/23   Dettinger, Elige Radon, MD  rosuvastatin (CRESTOR) 20 MG tablet Take 1 tablet (20 mg total) by mouth daily. 01/07/23   Raliegh Ip, DO  Semaglutide, 1 MG/DOSE, 4 MG/3ML SOPN Inject 1 mg as directed once a week. 01/07/23   Raliegh Ip, DO  SUMAtriptan (IMITREX) 100 MG tablet May repeat in 2 hours if headache persists or recurs. 10/03/22   Raliegh Ip, DO  triamcinolone cream (KENALOG) 0.1 % Apply 1 application. topically 2 (two) times daily. X7-10 days for rash Patient not taking: Reported on 09/09/2023 11/17/21   Raliegh Ip, DO                                                                                                                                    Past Surgical History Past Surgical History:  Procedure Laterality Date   COLONOSCOPY  2004   Dr. Sabino Gasser: small fissue and hemorrhoids   COLONOSCOPY  12/2011   Dr. Dulce Sellar: normal   CYSTOSCOPY W/ URETERAL STENT PLACEMENT Right 08/13/2013   Procedure: CYSTOSCOPY WITH RETROGRADE PYELOGRAM/URETERAL STENT PLACEMENT;  Surgeon: Ky Barban, MD;  Location: [facility];  Service: Urology;  Laterality: Right;   EXTRACORPOREAL SHOCK WAVE LITHOTRIPSY Right 08/19/2013   Procedure: EXTRACORPOREAL SHOCK WAVE LITHOTRIPSY (ESWL) RIGHT URETERAL CALCULUS;  Surgeon: Ky Barban, MD;  Location: [facility];  Service: Urology;  Laterality: Right;   WRIST SURGERY Right    Family History Family  History  Problem Relation Age of Onset   Colon polyps Mother    Rheum arthritis Sister    Breast cancer Maternal Aunt    Breast cancer Maternal Aunt    Colon cancer Maternal Grandmother        age 69   CAD Brother     Social History Social History   Tobacco Use   Smoking status: Never   Smokeless tobacco: Never   Tobacco comments:    Never  smoked  Vaping Use   Vaping status: Never Used  Substance Use Topics   Alcohol use: No    Alcohol/week: 0.0 standard drinks of alcohol   Drug use: No   Allergies Codeine, Gabapentin, and Lisinopril  Review of Systems Review of Systems  Cardiovascular:  Positive for chest pain.    Physical Exam Vital Signs  I have reviewed the triage vital signs BP (!) 131/91   Pulse 97   Temp 98.3 F (36.8 C) (Oral)   Resp 16   SpO2 94%   Physical Exam Vitals and nursing note reviewed.  Constitutional:      General: She is not in acute distress.    Appearance: She is well-developed.  HENT:     Head: Normocephalic and atraumatic.  Eyes:     Conjunctiva/sclera: Conjunctivae normal.  Cardiovascular:     Rate and Rhythm: Normal rate and regular rhythm.     Heart sounds: No murmur heard. Pulmonary:     Effort: Pulmonary effort is normal. No respiratory distress.     Breath sounds: Normal breath sounds.  Abdominal:     Palpations: Abdomen is soft.     Tenderness: There is no abdominal tenderness.  Musculoskeletal:        General: No swelling.     Cervical back: Neck supple.  Skin:    General: Skin is warm and dry.     Capillary Refill: Capillary refill takes less than 2 seconds.  Neurological:     Mental Status: She is alert.  Psychiatric:        Mood and Affect: Mood normal.     ED Results and Treatments Labs (all labs ordered are listed, but only abnormal results are displayed) Labs Reviewed  COMPREHENSIVE METABOLIC PANEL - Abnormal; Notable for the following components:      Result Value   Glucose, Bld 130 (*)    All  other components within normal limits  CBC WITH DIFFERENTIAL/PLATELET - Abnormal; Notable for the following components:   RBC 5.82 (*)    Hemoglobin 11.8 (*)    MCV 67.2 (*)    MCH 20.3 (*)    RDW 16.8 (*)    All other components within normal limits  BRAIN NATRIURETIC PEPTIDE  TROPONIN I (HIGH SENSITIVITY)  TROPONIN I (HIGH SENSITIVITY)                                                                                                                          Radiology No results found.  Pertinent labs & imaging results that were available during my care of the patient were reviewed by me and considered in my medical decision making (see MDM for details).  Medications Ordered in ED Medications  ketorolac (TORADOL) 15 MG/ML injection 15 mg (has no administration in time range)  nitroGLYCERIN (NITROSTAT) SL tablet 0.4 mg (0.4 mg Sublingual Given 09/20/23 0030)  iohexol (OMNIPAQUE) 350 MG/ML injection 75 mL (75 mLs Intravenous Contrast Given 09/20/23 0139)  Procedures Procedures  (including critical care time)  Medical Decision Making / ED Course   This patient presents to the ED for concern of chest pain, this involves an extensive number of treatment options, and is a complaint that carries with it a high risk of complications and morbidity.  The differential diagnosis includes ACS, Aortic Dissection, Pneumothorax, Pneumonia, Esophageal Rupture, PE, Tamponade/Pericardial Effusion, pericarditis, esophageal spasm, dysrhythmia, GERD, costochondritis.  MDM: Patient seen emergency room for evaluation of chest pain.  Physical exam is largely unremarkable.  Laboratory evaluation with a hemoglobin of 11.8 which is near patient's baseline with an MCV of 67.2, high-sensitivity troponin and delta troponin are negative, BNP negative.  Chest x-ray unremarkable.  PE study  obtained in the setting of pleuritic chest pain which was also reassuringly negative for PE and showed some mild pneumonitis.  Patient given nitroglycerin and Toradol and on reevaluation symptoms have almost completely resolved.  She is able to ambulate without difficulty without return of pain.  Heart score is moderate and after shared decision-making, we will opt for outpatient cardiology follow-up and I placed a urgent referral to cardiology.  She was given strict return precautions of which she voiced understanding she was discharged.   Additional history obtained: -External records from outside source obtained and reviewed including: Chart review including previous notes, labs, imaging, consultation notes   Lab Tests: -I ordered, reviewed, and interpreted labs.   The pertinent results include:   Labs Reviewed  COMPREHENSIVE METABOLIC PANEL - Abnormal; Notable for the following components:      Result Value   Glucose, Bld 130 (*)    All other components within normal limits  CBC WITH DIFFERENTIAL/PLATELET - Abnormal; Notable for the following components:   RBC 5.82 (*)    Hemoglobin 11.8 (*)    MCV 67.2 (*)    MCH 20.3 (*)    RDW 16.8 (*)    All other components within normal limits  BRAIN NATRIURETIC PEPTIDE  TROPONIN I (HIGH SENSITIVITY)  TROPONIN I (HIGH SENSITIVITY)      EKG   EKG Interpretation Date/Time:  Thursday September 19 2023 23:23:41 EST Ventricular Rate:  80 PR Interval:  160 QRS Duration:  91 QT Interval:  386 QTC Calculation: 446 R Axis:   70  Text Interpretation: Sinus rhythm Low voltage, precordial leads Confirmed by Zakira Ressel ([id number]) on 09/19/2023 11:25:45 PM         Imaging Studies ordered: I ordered imaging studies including chest x-ray, CT PE I independently visualized and interpreted imaging. I agree with the radiologist interpretation   Medicines ordered and prescription drug management: Meds ordered this encounter  Medications    nitroGLYCERIN (NITROSTAT) SL tablet 0.4 mg   iohexol (OMNIPAQUE) 350 MG/ML injection 75 mL   ketorolac (TORADOL) 15 MG/ML injection 15 mg    -I have reviewed the patients home medicines and have made adjustments as needed  Critical interventions none    Cardiac Monitoring: The patient was maintained on a cardiac monitor.  I personally viewed and interpreted the cardiac monitored which showed an underlying rhythm of: NSR  Social Determinants of Health:  Factors impacting patients care include: none   Reevaluation: After the interventions noted above, I reevaluated the patient and found that they have :improved  Co morbidities that complicate the patient evaluation  Past Medical History:  Diagnosis Date   Anemia 07/23/2011   Anxiety    Diabetes mellitus    Diabetic gastroparesis (HCC) 07/23/2011   Suspected   Headache(784.0)  Hearing difficulty    Hyperlipidemia    Hypothyroidism    Kidney stones    Microcytic anemia 07/23/2011   Thalassemia minor    ????? not really worked up per pt but daughter has it???      Dispostion: I considered admission for this patient, but given resolution of symptoms, we used shared decision making and decided on urgent outpatient cardiology follow-up with strict return precautions if chest pain returns.     Final Clinical Impression(s) / ED Diagnoses Final diagnoses:  None     @PCDICTATION @    Glendora Score, MD 09/20/23 [phone]

## 2023-09-27 ENCOUNTER — Encounter: Payer: Self-pay | Admitting: Family Medicine

## 2023-09-27 ENCOUNTER — Telehealth: Payer: Self-pay | Admitting: Pharmacist

## 2023-09-27 ENCOUNTER — Ambulatory Visit (INDEPENDENT_AMBULATORY_CARE_PROVIDER_SITE_OTHER): Payer: PPO | Admitting: Family Medicine

## 2023-09-27 VITALS — BP 124/67 | HR 79 | Temp 97.9°F | Ht 62.0 in | Wt 191.0 lb

## 2023-09-27 DIAGNOSIS — R41840 Attention and concentration deficit: Secondary | ICD-10-CM

## 2023-09-27 DIAGNOSIS — R911 Solitary pulmonary nodule: Secondary | ICD-10-CM | POA: Diagnosis not present

## 2023-09-27 DIAGNOSIS — Z794 Long term (current) use of insulin: Secondary | ICD-10-CM | POA: Diagnosis not present

## 2023-09-27 DIAGNOSIS — E1169 Type 2 diabetes mellitus with other specified complication: Secondary | ICD-10-CM

## 2023-09-27 DIAGNOSIS — K219 Gastro-esophageal reflux disease without esophagitis: Secondary | ICD-10-CM

## 2023-09-27 DIAGNOSIS — Z7985 Long-term (current) use of injectable non-insulin antidiabetic drugs: Secondary | ICD-10-CM | POA: Diagnosis not present

## 2023-09-27 MED ORDER — BUPROPION HCL ER (SR) 150 MG PO TB12
150.0000 mg | ORAL_TABLET | Freq: Every morning | ORAL | 3 refills | Status: DC
Start: 2023-09-27 — End: 2024-04-22

## 2023-09-27 MED ORDER — PANTOPRAZOLE SODIUM 40 MG PO TBEC: 40.0000 mg | DELAYED_RELEASE_TABLET | Freq: Every day | ORAL | 0 refills | Status: DC

## 2023-09-27 MED ORDER — SEMAGLUTIDE (2 MG/DOSE) 8 MG/3ML ~~LOC~~ SOPN: 2.0000 mg | PEN_INJECTOR | SUBCUTANEOUS | Status: AC

## 2023-09-27 NOTE — Telephone Encounter (Signed)
 Patient also here today and asking about Ozempic 1mg  shipment I thought we had already completed their re-enrollment Can you follow up with Patient? I'm happy to sign anything

## 2023-09-27 NOTE — Progress Notes (Signed)
 Subjective: CC: Attention PCP: Raliegh Ip, DO ZOX:[id number] R Jagielski is a 68 y.o. female presenting to clinic today for:  1.  Attention and concentration She reports a tremendous improvement with Wellbutrin.  She said that she is gotten more done last week than she has in the last several years.  She is very pleased with how things are going and denies any impact on sleep or other concerning side effects  2.  ER visit She had a couple ER visits since her last visit.  She apparently sustained corneal abrasion to the left eye and was subsequently seen by ophthalmology who agreed with ongoing treatment with erythromycin.  Overall it seems to be much better than it was.  She also saw him for some atypical chest pain.  She had CT angiogram which ruled out pulmonary embolism but incidentally found some changes in her heart that were of concern and so she was referred to cardiology as well as pulmonary nodule that needs follow-up.  She has never been a smoker.  She has never had any exposure to any chemicals that she knows of.  She has had maybe some GERD symptoms since being on the semaglutide but does not recall aspirating at all.   ROS: Per HPI  Allergies  Allergen Reactions   Codeine Nausea Only and Other (See Comments)    NAUSEA AND CAUSES HER TO HAVE BAD DREAMS   Gabapentin     Confusion    Lisinopril     Coughing   Past Medical History:  Diagnosis Date   Anemia 07/23/2011   Anxiety    Diabetes mellitus    Diabetic gastroparesis (HCC) 07/23/2011   Suspected   Headache(784.0)    Hearing difficulty    Hyperlipidemia    Hypothyroidism    Kidney stones    Microcytic anemia 07/23/2011   Thalassemia minor    ????? not really worked up per pt but daughter has it???    Current Outpatient Medications:    Aloe Vera 72 % CREA, Apply 1 Application topically 2 (two) times daily as needed. (Patient not taking: Reported on 09/09/2023), Disp: 114 g, Rfl: 0   aspirin 81 MG tablet, Take  81 mg by mouth daily., Disp: , Rfl:    buPROPion (WELLBUTRIN SR) 150 MG 12 hr tablet, Take 1 tablet (150 mg total) by mouth in the morning., Disp: 30 tablet, Rfl: 0   Capsaicin-Menthol-Methyl Sal (CAPSAICIN-METHYL SAL-MENTHOL) 0.025-1-12 % CREA, Apply 1 Application topically 2 (two) times daily as needed. (Patient not taking: Reported on 09/09/2023), Disp: 56.6 g, Rfl: 0   Continuous Glucose Sensor (FREESTYLE LIBRE 3 SENSOR) MISC, CHANGE EVERY 14 DAYS, Disp: 6 each, Rfl: 3   cyclobenzaprine (FLEXERIL) 10 MG tablet, Take 1 tablet (10 mg total) by mouth 3 (three) times daily as needed for muscle spasms. (Patient not taking: Reported on 09/09/2023), Disp: 30 tablet, Rfl: 0   docusate sodium (COLACE) 100 MG capsule, Take 200 mg by mouth daily as needed. , Disp: , Rfl:    glucose blood (GNP EASY TOUCH GLUCOSE TEST) test strip, EVERY DAY, Disp: 100 each, Rfl: 11   insulin aspart (NOVOLOG FLEXPEN) 100 UNIT/ML FlexPen, Inject 10-17 Units into the skin 3 (three) times daily with meals., Disp: 45 mL, Rfl: PRN   insulin glargine (LANTUS) 100 UNIT/ML injection, INJECT 0.22-0.3MLS (22-30 UNITS) INTO THE SKIN AT BEDTIME (Patient taking differently: Inject 10 Units into the skin at bedtime.), Disp: 30 mL, Rfl: PRN   Insulin Pen Needle (PEN  NEEDLES) 32G X 4 MM MISC, UAD E11.69, Disp: 100 each, Rfl: 3   Insulin Syringe-Needle U-100 (INSULIN SYRINGE .5CC/31GX5/16") 31G X 5/16" 0.5 ML MISC, UAD with Lantus E11.69, Disp: 100 each, Rfl: 3   levocetirizine (XYZAL) 5 MG tablet, Take 1 tablet (5 mg total) by mouth at bedtime as needed for allergies (itching/ rash). (Patient not taking: Reported on 09/09/2023), Disp: 90 tablet, Rfl: 0   levothyroxine (SYNTHROID) 137 MCG tablet, TAKE ONE TABLET EACH MORNING BEFORE BREAKFAST, Disp: 90 tablet, Rfl: 1   losartan (COZAAR) 25 MG tablet, TAKE ONE (1) TABLET EACH DAY, Disp: 90 tablet, Rfl: 3   Multiple Vitamin (MULTIVITAMIN WITH MINERALS) TABS tablet, Take 1 tablet by mouth daily., Disp:  , Rfl:    predniSONE (DELTASONE) 20 MG tablet, 2 po at same time daily for 5 days (Patient not taking: Reported on 09/09/2023), Disp: 10 tablet, Rfl: 0   rosuvastatin (CRESTOR) 20 MG tablet, Take 1 tablet (20 mg total) by mouth daily., Disp: 90 tablet, Rfl: 3   Semaglutide, 1 MG/DOSE, 4 MG/3ML SOPN, Inject 1 mg as directed once a week., Disp: 9 mL, Rfl: 3   SUMAtriptan (IMITREX) 100 MG tablet, May repeat in 2 hours if headache persists or recurs., Disp: 9 tablet, Rfl: PRN   triamcinolone cream (KENALOG) 0.1 %, Apply 1 application. topically 2 (two) times daily. 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   Occupation: Retired  Tobacco Use   Smoking status: Never   Smokeless tobacco: Never   Tobacco comments:    Never smoked  Vaping Use   Vaping status: Never Used  Substance and Sexual Activity   Alcohol use: No    Alcohol/week: 0.0 standard drinks of alcohol   Drug use: No   Sexual activity: Not Currently  Other Topics Concern   Not on file  Social History Narrative   Lives home with husband - Daughter lives in [place] - she has a great grandchild also   Social Drivers of Corporate investment banker Strain: Low Risk  (07/18/2023)   Overall Financial Resource Strain (CARDIA)    Difficulty of Paying Living Expenses: Not hard at all  Food Insecurity: No Food Insecurity (07/18/2023)   Hunger Vital Sign    Worried About Running Out of Food in the Last Year: Never true    Ran Out of Food in the Last Year: Never true  Transportation Needs: No Transportation Needs (07/18/2023)   PRAPARE - Administrator, Civil Service (Medical): No    Lack of  Transportation (Non-Medical): No  Physical Activity: Sufficiently Active (07/18/2023)   Exercise Vital Sign    Days of Exercise per Week: 7 days    Minutes of Exercise per Session: 60 min  Stress: No Stress Concern Present (02/26/2023)   [employer] of Occupational Health - Occupational Stress Questionnaire    Feeling of Stress : Not at all  Social Connections: Socially Integrated (07/18/2023)   Social Connection and Isolation Panel [NHANES]    Frequency of Communication with Friends and Family: More than three times a week    Frequency of Social Gatherings with Friends  and Family: More than three times a week    Attends Religious Services: More than 4 times per year    Active Member of Clubs or Organizations: Yes    Attends Banker Meetings: More than 4 times per year    Marital Status: Married  Catering manager Violence: Not At Risk (07/18/2023)   Humiliation, Afraid, Rape, and Kick questionnaire    Fear of Current or Ex-Partner: No    Emotionally Abused: No    Physically Abused: No    Sexually Abused: No   Family History  Problem Relation Age of Onset   Colon polyps Mother    Rheum arthritis Sister    Breast cancer Maternal Aunt    Breast cancer Maternal Aunt    Colon cancer Maternal Grandmother        age 50   CAD Brother     Objective: Office vital signs reviewed. BP 124/67   Pulse 79   Temp 97.9 F (36.6 C)   Ht 5\' 2"  (1.575 m)   Wt 191 lb (86.6 kg)   SpO2 99%   BMI 34.93 kg/m   Physical Examination:  General: Awake, alert, well nourished, No acute distress HEENT: sclera white, MMM Cardio: regular rate and rhythm  Pulm:  normal work of breathing on room air GI: nondistended     09/27/2023   12:05 PM 08/30/2023   11:37 AM 05/08/2023    9:42 AM  Depression screen PHQ 2/9  Decreased Interest 0 0 0  Down, Depressed, Hopeless 0 1 0  PHQ - 2 Score 0 1 0  Altered sleeping 0 1 1  Tired, decreased energy 0 1 0  Change in appetite 0 1 0  Feeling  bad or failure about yourself  0 1 0  Trouble concentrating 0 0 1  Moving slowly or fidgety/restless 0 0 0  Suicidal thoughts 0 0 0  PHQ-9 Score 0 5 2  Difficult doing work/chores Not difficult at all Somewhat difficult Not difficult at all      09/27/2023   12:05 PM 08/30/2023   11:37 AM 05/08/2023    9:43 AM 05/06/2023    9:13 AM  GAD 7 : Generalized Anxiety Score  Nervous, Anxious, on Edge 0 0 0 0  Control/stop worrying 0 0 0 0  Worry too much - different things 0 0 0 0  Trouble relaxing 0 0 1 1  Restless 0 0 1 1  Easily annoyed or irritable 0 0 0 0  Afraid - awful might happen 0 0 0 0  Total GAD 7 Score 0 0 2 2  Anxiety Difficulty  Not difficult at all Somewhat difficult Very difficult      Assessment/ Plan: 68 y.o. female   Concentration deficit - Plan: buPROPion (WELLBUTRIN SR) 150 MG 12 hr tablet  Pulmonary nodule seen on imaging study - Plan: CT Chest Wo Contrast  Gastroesophageal reflux disease without esophagitis - Plan: pantoprazole (PROTONIX) 40 MG tablet  Type 2 diabetes mellitus with other specified complication, with long-term current use of insulin (HCC) - Plan: Semaglutide, 2 MG/DOSE, 8 MG/3ML SOPN  Long-term current use of injectable noninsulin antidiabetic medication - Plan: Semaglutide, 2 MG/DOSE, 8 MG/3ML SOPN  Doing better from a concentration standpoint.  I renewed Wellbutrin.  I have ordered the CT chest without contrast to follow-up on the 7 mm pulmonary nodule noted on CTA.  This is requested to be scheduled sometime in September.  Further interventions including referral to pulmonology pending that  result  Will trial Protonix given reports of GERD on GIP.  Uncertain etiology of the pneumonitis she had but perhaps it was some irritation from GERD??  I will increase her semaglutide given elevations in A1c at 7.7 last visit.  I have talked to our clinical pharmacist who is ordering the new dose through the patient assistance program   Raliegh Ip, DO [facility] [phone]

## 2023-09-27 NOTE — Telephone Encounter (Signed)
 Correction Patient to increase to 2mg  Ozempic

## 2023-09-27 NOTE — Patient Instructions (Addendum)
 Since your sugar wasn't controlled last visit, I've asked Raynelle Fanning to order the 2mg  for you.  The pen will look different next time.  Pulmonary Nodule  A pulmonary nodule is a small, round growth of tissue in the lung. A nodule may be cancer, but most nodules are not cancer. What are the causes? Infection from a germ (bacteria, fungus, or virus), such as tuberculosis. Tissue that is cancer, such as: Cancer in the lung. Cancer that has spread to the lung from another part of the body. A growth of tissue (mass) that is not cancer. Swelling and irritation from conditions such as rheumatoid arthritis. Having blood vessels that are not normal in the lungs. What are the signs or symptoms? Many times, there are no symptoms. If you get symptoms, they normally have another cause, such as infection. How is this treated? Treatment depends on: If your nodule is cancer or if it is not cancer. What your risk of getting cancer is. Some nodules are not cancer. If this is the case for you, you may not need treatment. Your doctor may do tests to watch the nodule for changes. If the nodule is cancer: You will need tests, such as CT and PET scans. You may need treatment. This may include: Surgery. Treatment with high-energy X-rays (radiation therapy). Medicines. Some nodules need to be taken out. You may have a procedure to have the nodule taken out. During the procedure, your doctor will make a cut (incision) into your chest and take out the part of your lung that has the nodule. Follow these instructions at home: Take over-the-counter and prescription medicines only as told by your doctor. Do not smoke or use any products that contain nicotine or tobacco. If you need help quitting, ask your doctor. Keep all follow-up visits. Contact a doctor if: You have pain in your chest, back, or shoulder. You are short of breath or have trouble breathing when you are active. You get a cough. Your voice starts to  sound raspy, breathy, or strained (hoarse), and you do not know why. You feel sick or more tired than normal. You do not feel like eating. You lose weight without trying. You get chills, or you start to sweat a lot during sleep. You need two or more pillows to sleep on at night. You have: A fever and your symptoms get worse all of a sudden. A fever or symptoms for more than 2-3 days. Get help right away if: You cannot catch your breath. You have sudden chest pain. You start making high-pitched whistling sounds when you breathe, most often when you breathe out (you wheeze). You cannot stop coughing. You cough up blood or bloody mucus from your lungs (sputum). You get dizzy or feel like you may faint. These symptoms may represent a serious problem that is an emergency. Do not wait to see if the symptoms will go away. Get medical help right away. Call your local emergency services (911 in the [place] Do not drive yourself to the hospital. Summary A pulmonary nodule is a small, round growth of tissue in the lung. Most of these nodules are not cancer. Common causes of nodules in the lung include infection, swelling and irritation, and growths that are not cancer. Treatment depends on whether the nodule is cancer or is not cancer. Treatment also depends on your risk of getting cancer. If the nodule is cancer, you will need certain tests and treatments as told by your doctor. This information is not intended  to replace advice given to you by your health care provider. Make sure you discuss any questions you have with your health care provider. Document Revised: 01/19/2020 Document Reviewed: 01/20/2020 Elsevier Patient Education  2024 [employer].

## 2023-10-03 ENCOUNTER — Ambulatory Visit: Attending: Nurse Practitioner | Admitting: Nurse Practitioner

## 2023-10-03 ENCOUNTER — Ambulatory Visit: Attending: Nurse Practitioner

## 2023-10-03 ENCOUNTER — Encounter: Payer: Self-pay | Admitting: Nurse Practitioner

## 2023-10-03 ENCOUNTER — Telehealth: Payer: Self-pay

## 2023-10-03 ENCOUNTER — Other Ambulatory Visit: Payer: Self-pay | Admitting: Nurse Practitioner

## 2023-10-03 VITALS — BP 118/68 | HR 72 | Ht 62.0 in | Wt 191.0 lb

## 2023-10-03 DIAGNOSIS — I10 Essential (primary) hypertension: Secondary | ICD-10-CM

## 2023-10-03 DIAGNOSIS — E669 Obesity, unspecified: Secondary | ICD-10-CM | POA: Diagnosis not present

## 2023-10-03 DIAGNOSIS — R002 Palpitations: Secondary | ICD-10-CM | POA: Diagnosis not present

## 2023-10-03 DIAGNOSIS — R42 Dizziness and giddiness: Secondary | ICD-10-CM

## 2023-10-03 DIAGNOSIS — E785 Hyperlipidemia, unspecified: Secondary | ICD-10-CM

## 2023-10-03 DIAGNOSIS — R0609 Other forms of dyspnea: Secondary | ICD-10-CM | POA: Diagnosis not present

## 2023-10-03 DIAGNOSIS — R911 Solitary pulmonary nodule: Secondary | ICD-10-CM | POA: Diagnosis not present

## 2023-10-03 DIAGNOSIS — I25119 Atherosclerotic heart disease of native coronary artery with unspecified angina pectoris: Secondary | ICD-10-CM

## 2023-10-03 DIAGNOSIS — R0789 Other chest pain: Secondary | ICD-10-CM

## 2023-10-03 MED ORDER — NITROGLYCERIN 0.4 MG SL SUBL: 0.4000 mg | SUBLINGUAL_TABLET | SUBLINGUAL | 3 refills | Status: AC | PRN

## 2023-10-03 NOTE — Progress Notes (Signed)
 Pharmacy Medication Assistance Program Note    10/03/2023  Patient ID: Melissa Schneider, female   DOB: 1955-11-03, 69 y.o.   MRN: [id number]     10/03/2023  Outreach Medication One  Manufacturer Medication One [employer] [facility] Ozempic  Dose of Ozempic 2MG   Type of Radiographer, therapeutic Assistance  Date Application Sent to Prescriber 10/03/2023  Date Application Submitted to Manufacturer 10/03/2023  Method Application Sent to [employer], increased to 2mg .   Provider pages submitted 10/18/23

## 2023-10-03 NOTE — Progress Notes (Unsigned)
 Office Visit    Patient Name: Melissa Schneider Date of Encounter: 10/03/2023 PCP:  Raliegh Ip DO [facility]  Cardiologist:  Dina Rich, MD Advanced Practice Provider:  Sharlene Dory, NP Electrophysiologist:  None   Chief Complaint and HPI    Melissa Schneider is a 68 y.o. female with a hx of CAD, history of chest pain, T2DM, HLD, and HTN who presents today for scheduled follow-up.   Previous negative NST in 2018, history of minimal CAD seen via coronary CTA in 2021, coronary calcium score of 1.  Last seen by Dr. Dina Rich on March 28, 2021.  Was doing well at the time.   I last saw her on September 17, 2022. Was doing well at the time.   ED visit on September 19, 2023 for atypical chest pain.  Workup was overall unremarkable however CTA of chest revealed no evidence of PE/aortic dissection with minimal aortic and coronary artery atherosclerosis, slight cardiomegaly, linear intermixed with ground-glass opacities in basal segments of both lower lobes and in lingular base, which could all be due to atelectasis/combination of atelectasis and pneumonia.  7 mm noncalcified nodule noted in the anterior segment of the right upper lobe.  Recommended to repeat CT of chest in 6 to 12 months.  Today she presents for regular follow-up and chest pain evaluation.  She notes sensation of mild/moderate dull ache along left side of chest, describes as deep along left breast tissue, typically notices in the evenings before bedtime, has been ongoing within the past few weeks.  She says her symptoms do seem to be better after being started on Protonix.  Does admit to some dyspnea on exertion has been ongoing for some time.  Typically noted when going up and down stairs or when working outside, says she has to rest for symptoms to be improved.  She has been having palpitations along with this, has been chronic and ongoing for the last 1 to 2 years.  She has some questions  today regarding her recent CT scan performed in the ED. Denies any syncope, presyncope, dizziness, orthopnea, PND, swelling or significant weight changes, acute bleeding, or claudication.  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  Volunteers with [employer].  EKGs/Labs/Other Studies Reviewed:   The following studies were reviewed today:   EKG:  EKG is not ordered today.  EKG dated September 19, 2023 was reviewed and showed normal sinus rhythm, 80 bpm, unremarkable EKG.  CCTA 08/2019: IMPRESSION: 1. Minimal CAD, CADRADS = 1.   2. The patient's coronary artery calcium score is 10, which places the patient in the 67th percentile for age and sex matched controls.   3. Normal coronary origin with right dominance.   Lexiscan 01/2017: No diagnostic ST segment changes to indicate ischemia. Low risk Duke treadmill score 4.5. Blood pressure demonstrated a hypertensive response to exercise. Small, mild intensity, fixed anteroapical defect consistent with breast attenuation. This is a low risk study. Nuclear stress EF: 76%.   ETT 01/2016: inalized by Pricilla Riffle, MD on Mon Jan 23, 2016  3:32 PM There was no ST segment deviation noted during stress. No T wave inversion was noted during stress. Pressure rate product is 20, 701. Test at marginal workload For heart rate achieved, there were no ischemic EKG changes Conclusion limited.  Echo 08/2015: Study Conclusions   - Left ventricle: The cavity size was normal. Wall thickness was at    the  upper limits of normal. Systolic function was normal. The    estimated ejection fraction was in the range of 60% to 65%. Wall    motion was normal; there were no regional wall motion    abnormalities. Left ventricular diastolic function parameters    were normal.  - Ascending aorta: The ascending aorta was mildly ectatic.  - Mitral valve: There was trivial regurgitation.  - Right atrium: Central venous pressure (est): 3  mm Hg.  - Tricuspid valve: There was trivial regurgitation.  - Pulmonary arteries: PA peak pressure: 23 mm Hg (S).  - Pericardium, extracardiac: There was no pericardial effusion.   Impressions:   - Upper normal LV wall thickness with LVEF 60-65% and grossly    normal diastolic function. Mildly ectatic ascending aorta.    Trivial mitral and tricuspid regurgitation. Estimated PASP 23    mmHg.   Recen Risk Assessment/Calculations:    The 10-year ASCVD risk score (Arnett DK, et al., 2019) is: 10.6%   Values used to calculate the score:     Age: 77 years     Sex: Female     Is Non-Hispanic African American: No     Diabetic: Yes     Tobacco smoker: No     Systolic Blood Pressure: 118 mmHg     Is BP treated: Yes     HDL Cholesterol: 81 mg/dL     Total Cholesterol: 168 mg/dL  Review of Systems    All other systems reviewed and are otherwise negative except as noted above.  Physical Exam    VS:  BP 118/68 (BP Location: Right Arm, Patient Position: Sitting, Cuff Size: Large)   Pulse 72   Ht 5\' 2"  (1.575 m)   Wt 191 lb (86.6 kg)   SpO2 98%   BMI 34.93 kg/m  , BMI Body mass index is 34.93 kg/m.  Wt Readings from Last 3 Encounters:  10/03/23 191 lb (86.6 kg)  09/27/23 191 lb (86.6 kg)  09/11/23 193 lb 12.6 oz (87.9 kg)     GEN: Obese, 68 y.o. female in no acute distress. HEENT: normal. Neck: Supple, no JVD, carotid bruits, or masses. Cardiac: S1/S2, RRR, no murmurs, rubs, or gallops. No clubbing, cyanosis, edema. Radials/PT 2+ and equal bilaterally.  Respiratory:  Respirations regular and unlabored, clear to auscultation bilaterally. GI: Soft, nontender, nondistended. MS: No deformity or atrophy. Skin: Warm and dry, no rash. Neuro:  Strength and sensation are intact. Psych: Normal affect.  Assessment & Plan    CAD, atypical CP Admits to improvement in chest pain within the past few weeks, etiology atypical.  Recent workup in ED was overall unremarkable, ruled out  for ACS.  CCTA in 2021 showed minimal CAD, coronary calcium score was 10.  No indication for ischemic evaluation at this time.  Continue aspirin, losartan, rosuvastatin.  Will prescribe nitroglycerin as needed for chest pain.  I educated her regarding this medicine and she verbalized understanding. Heart healthy diet and regular cardiovascular exercise encouraged.  Care and ED precautions discussed.  HTN Blood pressure stable.  Continue current medication regimen. Heart healthy diet and regular cardiovascular exercise encouraged.   HLD LDL 75 in June 2024.  Continue rosuvastatin. Heart healthy diet and regular cardiovascular exercise encouraged.   4.  Dyspnea on exertion, palpitations Etiology unclear.  Palpitations improve with rest.  Will arrange echocardiogram for further evaluation for her DOE.  Continue current medication regimen.  Continue follow-up with PCP.  Care and ED precautions discussed.  Discussed lifestyle  changes.  Discussed to avoid caffeine.  If no improvement with her palpitations by next follow-up visit, plan to arrange monitor.  5.  Obesity  Weight loss via diet and exercise encouraged. Discussed the impact being overweight would have on cardiovascular risk.  6. Pulmonary nodule Recent CTA of chest revealed no evidence of PE/aortic dissection with minimal aortic and coronary artery atherosclerosis, slight cardiomegaly, linear intermixed with ground-glass opacities in basal segments of both lower lobes and in lingular base, which could all be due to atelectasis/combination of atelectasis and pneumonia.  7 mm noncalcified nodule noted in the anterior segment of the right upper lobe.  Went over and reviewed these findings with patient.  She verbalized understanding.  Recommended to repeat CT of chest in 6 to 12 months, plan to arrange next year. Continue to follow with PCP.  Disposition: Follow up in 6-8 weeks with Dina Rich, MD or APP.  Signed, Sharlene Dory, NP

## 2023-10-03 NOTE — Patient Instructions (Signed)
 Medication Instructions:   Nitroglycerin as needed for chest pain  Continue all other medications.     Labwork:  none  Testing/Procedures:  Your physician has requested that you have an echocardiogram. Echocardiography is a painless test that uses sound waves to create images of your heart. It provides your doctor with information about the size and shape of your heart and how well your heart's chambers and valves are working. This procedure takes approximately one hour. There are no restrictions for this procedure. Please do NOT wear cologne, perfume, aftershave, or lotions (deodorant is allowed). Please arrive 15 minutes prior to your appointment time.  Please note: We ask at that you not bring children with you during ultrasound (echo/ vascular) testing. Due to room size and safety concerns, children are not allowed in the ultrasound rooms during exams. Our front office staff cannot provide observation of children in our lobby area while testing is being conducted. An adult accompanying a patient to their appointment will only be allowed in the ultrasound room at the discretion of the ultrasound technician under special circumstances. We apologize for any inconvenience. Your physician has recommended that you wear a 14 day event monitor. Event monitors are medical devices that record the heart's electrical activity. Doctors most often [place] these monitors to diagnose arrhythmias. Arrhythmias are problems with the speed or rhythm of the heartbeat. The monitor is a small, portable device. You can wear one while you do your normal daily activities. This is usually used to diagnose what is causing palpitations/syncope (passing out). Office will contact with results via phone, letter or mychart.     Follow-Up:  6 - 8 weeks   Any Other Special Instructions Will Be Listed Below (If Applicable).   If you need a refill on your cardiac medications before your next appointment, please call your  pharmacy.

## 2023-10-05 ENCOUNTER — Other Ambulatory Visit: Payer: Self-pay | Admitting: Family Medicine

## 2023-10-05 DIAGNOSIS — E1169 Type 2 diabetes mellitus with other specified complication: Secondary | ICD-10-CM

## 2023-10-07 ENCOUNTER — Other Ambulatory Visit: Payer: Self-pay | Admitting: *Deleted

## 2023-10-07 NOTE — Patient Instructions (Signed)
 Visit Information  Thank you for taking time to visit with me today. Please don't hesitate to contact me if I can be of assistance to you before our next scheduled telephone appointment.  Following are the goals we discussed today:   Goals Addressed             This Visit's Progress    RNCM Care Management  (HYPERTENSION) EXPECTED OUTCOME: MONITOR, SELF-MANAGE AND REDUCE SYMPTOMS OF HYPERTENSION       Current Barriers:  Knowledge Deficits related to Hypertension management Care Coordination needs related to pharmacy, medication in a patient with Hypertension Chronic Disease Management support and education needs related to Hypertension No Advanced Directives in place- pt reports she has documents on hand to complete Patient reports she lives with spouse, is independent with all aspects of her care, drives, volunteers [address], walks daily for 1.5-3 miles Patient reports she checks blood pressure once per week and states "readings are good" Patient does still have occasional migrane and reports Imitrex is helping her manage BP Readings from Last 3 Encounters:  10/03/23 118/68  09/27/23 124/67  09/20/23 (!) 146/125     Planned Interventions: Evaluation of current treatment plan related to hypertension self management and patient's adherence to plan as established by provider. Reports checking BP, recent reading of 117/70. Reports that she is schedule to wear a home heart monitor after she completes her echo. Denies any chest pain, swelling, dizziness or headache. Reviewed medications with patient and discussed importance of compliance; Reports compliance with all medications Counseled on the importance of exercise goals with target of 150 minutes per week.  Advised patient, providing education and rationale, to monitor blood pressure daily and record, calling PCP for findings outside established parameters. Does not check daily but does check often and record findings. Reviewed low  sodium diet  Symptom Management: Take medications as prescribed   Attend all scheduled provider appointments Call pharmacy for medication refills 3-7 days in advance of running out of medications Attend church or other social activities Perform all self care activities independently  Perform IADL's (shopping, preparing meals, housekeeping, managing finances) independently Call provider office for new concerns or questions  check blood pressure weekly choose a place to take my blood pressure (home, clinic or office, retail store) write blood pressure results in a log or diary keep a blood pressure log take blood pressure log to all doctor appointments keep all doctor appointments take medications for blood pressure exactly as prescribed report new symptoms to your doctor eat more whole grains, fruits and vegetables, lean meats and healthy fats Continue walking daily - keep up the good work! Follow low sodium diet Read food labels for sodium content Limit/ avoid fast food  Follow Up Plan: Telephone follow up appointment with care management team member scheduled for:   11-07-2023 at 9:45 am       RNCM Care Mangement:  (DIABETES) EXPECTED OUTCOME: MONITOR, SELF-MANAGE AND REDUCE SYMPTOMS OF DIABETES       Current Barriers:  Knowledge Deficits related to Diabetes management Chronic Disease Management support and education needs related to Diabetes, ciet No Advanced Directives in place- pt has documents on hand   Lab Results  Component Value Date   HGBA1C 7.7 (H) 08/30/2023    Planned Interventions: Provided education to patient about basic DM disease process. Uses Libre for CGM.  Reports fasting of 120 this morning. Lowest reading 60s and highest 180. She is actively working to get back on track. RNCM  reviewed fasting goal of <130 and post prandial of <180. Reviewed medications with patient and discussed importance of medication adherence. Compliant with all medications Counseled  on importance of regular laboratory monitoring as prescribed. Has labs on a regular basis, labs are up to date;        Discussed plans with patient for ongoing care management follow up and provided patient with direct contact information for care management team;      Advised patient, providing education and rationale, to check cbg per CGM  and record. Has a freestyle libre and keeps good check on her blood sugar readings.        call provider for findings outside established parameters;       Review of patient status, including review of consultants reports, relevant laboratory and other test results, and medications completed;       Reinforced carbohydrate modified diet and food choices  Symptom Management: Take medications as prescribed   Attend all scheduled provider appointments Call pharmacy for medication refills 3-7 days in advance of running out of medications Attend church or other social activities Perform all self care activities independently  Perform IADL's (shopping, preparing meals, housekeeping, managing finances) independently Call provider office for new concerns or questions  check blood sugar at prescribed times: per [facility]  check feet daily for cuts, sores or redness enter blood sugar readings and medication or insulin into daily log take the blood sugar log to all doctor visits take the blood sugar meter to all doctor visits trim toenails straight across fill half of plate with vegetables limit fast food meals to no more than 1 per week manage portion size prepare main meal at home 3 to 5 days each week read food labels for fat, fiber, carbohydrates and portion size set a realistic goal Follow RULE OF 15 for low blood sugar management:  How to treat low blood sugars (Blood sugar less than 70 mg/dl  Please follow the RULE OF 15 for the treatment of hypoglycemia treatment (When your blood sugars are less than 70 mg/ dl) STEP  1:  Take 15 grams of  carbohydrates when your blood sugar is low, which includes:   3-4 glucose tabs or  3-4 oz of juice or regular soda or  One tube of glucose gel STEP 2:  Recheck blood sugar in 15 minutes STEP 3:  If your blood sugar is still low at the 15 minute recheck ---then, go back to STEP 1 and treat again with another 15 grams of carbohydrates  Follow Up Plan: Telephone follow up appointment with care management team member scheduled for:   11-07-2023 at 9:45 am           Our next appointment is by telephone on 11-07-2023 at 9:45 am  Please call the care guide team at [phone] if you need to cancel or reschedule your appointment.   If you are experiencing a Mental Health or Behavioral Health Crisis or need someone to talk to, please call the Suicide and Crisis Lifeline: 988 call the [place] National Suicide Prevention Lifeline: 510-223-8986 or TTY: 419 565 1421 TTY (385) 089-3075) to talk to a trained counselor call 1-800-273-TALK (toll free, 24 hour hotline)   Patient verbalizes understanding of instructions and care plan provided today and agrees to view in MyChart. Active MyChart status and patient understanding of how to access instructions and care plan via MyChart confirmed with patient.     Telephone follow up appointment with care management team member scheduled for:11-07-2023 at  9:45 am  Larey Brick, BSN RN [facility], [facility] RN Care Manager Direct Dial: [phone]  Fax: [phone]

## 2023-10-07 NOTE — Patient Outreach (Signed)
 Care Management   Visit Note  10/07/2023 Name: Melissa Schneider MRN: [id number] DOB: Jul 18, 1955  Subjective: Melissa Schneider is a 68 y.o. year old female who is a primary care patient of Raliegh Ip, DO. The Care Management team was consulted for assistance.      Engaged with patient spoke with patient by telephone.    Goals Addressed             This Visit's Progress    RNCM Care Management  (HYPERTENSION) EXPECTED OUTCOME: MONITOR, SELF-MANAGE AND REDUCE SYMPTOMS OF HYPERTENSION       Current Barriers:  Knowledge Deficits related to Hypertension management Care Coordination needs related to pharmacy, medication in a patient with Hypertension Chronic Disease Management support and education needs related to Hypertension No Advanced Directives in place- pt reports she has documents on hand to complete Patient reports she lives with spouse, is independent with all aspects of her care, drives, volunteers [address], walks daily for 1.5-3 miles Patient reports she checks blood pressure once per week and states "readings are good" Patient does still have occasional migrane and reports Imitrex is helping her manage BP Readings from Last 3 Encounters:  10/03/23 118/68  09/27/23 124/67  09/20/23 (!) 146/125     Planned Interventions: Evaluation of current treatment plan related to hypertension self management and patient's adherence to plan as established by provider. Reports checking BP, recent reading of 117/70. Reports that she is schedule to wear a home heart monitor after she completes her echo. Denies any chest pain, swelling, dizziness or headache. Reviewed medications with patient and discussed importance of compliance; Reports compliance with all medications Counseled on the importance of exercise goals with target of 150 minutes per week.  Advised patient, providing education and rationale, to monitor blood pressure daily and record, calling PCP for findings outside  established parameters. Does not check daily but does check often and record findings. Reviewed low sodium diet  Symptom Management: Take medications as prescribed   Attend all scheduled provider appointments Call pharmacy for medication refills 3-7 days in advance of running out of medications Attend church or other social activities Perform all self care activities independently  Perform IADL's (shopping, preparing meals, housekeeping, managing finances) independently Call provider office for new concerns or questions  check blood pressure weekly choose a place to take my blood pressure (home, clinic or office, retail store) write blood pressure results in a log or diary keep a blood pressure log take blood pressure log to all doctor appointments keep all doctor appointments take medications for blood pressure exactly as prescribed report new symptoms to your doctor eat more whole grains, fruits and vegetables, lean meats and healthy fats Continue walking daily - keep up the good work! Follow low sodium diet Read food labels for sodium content Limit/ avoid fast food  Follow Up Plan: Telephone follow up appointment with care management team member scheduled for:   11-07-2023 at 9:45 am       RNCM Care Mangement:  (DIABETES) EXPECTED OUTCOME: MONITOR, SELF-MANAGE AND REDUCE SYMPTOMS OF DIABETES       Current Barriers:  Knowledge Deficits related to Diabetes management Chronic Disease Management support and education needs related to Diabetes, ciet No Advanced Directives in place- pt has documents on hand   Lab Results  Component Value Date   HGBA1C 7.7 (H) 08/30/2023    Planned Interventions: Provided education to patient about basic DM disease process. Uses Libre for CGM.  Reports fasting  of 120 this morning. Lowest reading 60s and highest 180. She is actively working to get back on track. RNCM reviewed fasting goal of <130 and post prandial of <180. Reviewed medications with  patient and discussed importance of medication adherence. Compliant with all medications Counseled on importance of regular laboratory monitoring as prescribed. Has labs on a regular basis, labs are up to date;        Discussed plans with patient for ongoing care management follow up and provided patient with direct contact information for care management team;      Advised patient, providing education and rationale, to check cbg per CGM  and record. Has a freestyle libre and keeps good check on her blood sugar readings.        call provider for findings outside established parameters;       Review of patient status, including review of consultants reports, relevant laboratory and other test results, and medications completed;       Reinforced carbohydrate modified diet and food choices  Symptom Management: Take medications as prescribed   Attend all scheduled provider appointments Call pharmacy for medication refills 3-7 days in advance of running out of medications Attend church or other social activities Perform all self care activities independently  Perform IADL's (shopping, preparing meals, housekeeping, managing finances) independently Call provider office for new concerns or questions  check blood sugar at prescribed times: per [facility]  check feet daily for cuts, sores or redness enter blood sugar readings and medication or insulin into daily log take the blood sugar log to all doctor visits take the blood sugar meter to all doctor visits trim toenails straight across fill half of plate with vegetables limit fast food meals to no more than 1 per week manage portion size prepare main meal at home 3 to 5 days each week read food labels for fat, fiber, carbohydrates and portion size set a realistic goal Follow RULE OF 15 for low blood sugar management:  How to treat low blood sugars (Blood sugar less than 70 mg/dl  Please follow the RULE OF 15 for the treatment of  hypoglycemia treatment (When your blood sugars are less than 70 mg/ dl) STEP  1:  Take 15 grams of carbohydrates when your blood sugar is low, which includes:   3-4 glucose tabs or  3-4 oz of juice or regular soda or  One tube of glucose gel STEP 2:  Recheck blood sugar in 15 minutes STEP 3:  If your blood sugar is still low at the 15 minute recheck ---then, go back to STEP 1 and treat again with another 15 grams of carbohydrates  Follow Up Plan: Telephone follow up appointment with care management team member scheduled for:   11-07-2023 at 9:45 am              Consent to Services:  Patient was given information about care management services, agreed to services, and gave verbal consent to participate.   Plan: Telephone follow up appointment with care management team member scheduled for:11-07-2023 at 9:45 am Larey Brick, BSN RN [facility], [facility] RN Care Manager Direct Dial: [phone]  Fax: [phone]

## 2023-10-08 ENCOUNTER — Other Ambulatory Visit: Payer: Self-pay | Admitting: Family Medicine

## 2023-10-08 DIAGNOSIS — Z1231 Encounter for screening mammogram for malignant neoplasm of breast: Secondary | ICD-10-CM

## 2023-10-09 ENCOUNTER — Ambulatory Visit
Admission: RE | Admit: 2023-10-09 | Discharge: 2023-10-09 | Discharge status: Home / Self Care | Source: Ambulatory Visit | Attending: Family Medicine

## 2023-10-09 DIAGNOSIS — Z1231 Encounter for screening mammogram for malignant neoplasm of breast: Secondary | ICD-10-CM | POA: Diagnosis not present

## 2023-10-17 ENCOUNTER — Ambulatory Visit: Attending: Nurse Practitioner

## 2023-10-17 DIAGNOSIS — R0609 Other forms of dyspnea: Secondary | ICD-10-CM | POA: Diagnosis not present

## 2023-10-20 LAB — ECHOCARDIOGRAM COMPLETE
AR max vel: 2.36 cm2
AV Area VTI: 2.45 cm2
AV Area mean vel: 2.26 cm2
AV Mean grad: 5 mmHg
AV Peak grad: 9.6 mmHg
Ao pk vel: 1.55 m/s
Area-P 1/2: 4.6 cm2
Calc EF: 63.1 %
MV VTI: 2.03 cm2
S' Lateral: 2.9 cm
Single Plane A2C EF: 60.6 %
Single Plane A4C EF: 67.9 %

## 2023-11-01 NOTE — Telephone Encounter (Signed)
 Per novo nordisk, pcp pages never rec'd. Refaxed 11/01/23.

## 2023-11-04 ENCOUNTER — Other Ambulatory Visit: Payer: Self-pay | Admitting: Family Medicine

## 2023-11-04 DIAGNOSIS — E039 Hypothyroidism, unspecified: Secondary | ICD-10-CM

## 2023-11-04 DIAGNOSIS — E1169 Type 2 diabetes mellitus with other specified complication: Secondary | ICD-10-CM

## 2023-11-06 NOTE — Progress Notes (Signed)
 Pharmacy Medication Assistance Program Note    11/06/2023  Patient ID: Melissa Schneider, female   DOB: [birth year], 68 y.o.   MRN: [id number]     10/03/2023  Outreach Medication One  Manufacturer Medication One Novo Nordisk  Nordisk Drugs Ozempic   Dose of Ozempic  2MG   Type of Sport and exercise psychologist  Date Application Sent to Prescriber 10/03/2023  Date Application Submitted to Manufacturer 10/03/2023  Method Application Sent to Manufacturer Online  Patient Assistance Determination Approved  Approval Start Date 11/05/2023  Approval End Date 10/28/2024     Renewal approved - medication should arrive to office in 10-14 business days

## 2023-11-07 ENCOUNTER — Other Ambulatory Visit: Payer: Self-pay | Admitting: *Deleted

## 2023-11-07 DIAGNOSIS — E118 Type 2 diabetes mellitus with unspecified complications: Secondary | ICD-10-CM

## 2023-11-07 NOTE — Patient Instructions (Signed)
 Visit Information  Thank you for taking time to visit with me today. Please don't hesitate to contact me if I can be of assistance to you before our next scheduled appointment.  Your next care management appointment is by telephone on 12-05-2023 at 9:00 am  Telephone follow-up in 1 month  Please call the care guide team at [phone] if you need to cancel, schedule, or reschedule an appointment.   Please call the Suicide and Crisis Lifeline: 988 call the [place]  National Suicide Prevention Lifeline: 905 426 0408 or TTY: (559)321-5997 TTY 930-575-3762) to talk to a trained counselor call 1-800-273-TALK (toll free, 24 hour hotline) call the [facility]: 267-640-4969 call 911 if you are experiencing a Mental Health or Behavioral Health Crisis or need someone to talk to.  Grandville Lax, BSN RN [facility], [facility] RN Care Manager Direct Dial: [phone]  Fax: [phone]   Preventing Hypoglycemia Hypoglycemia is when the amount of sugar, or glucose, in your blood is too low. Low blood sugar can happen if you have diabetes or if you don't have diabetes. It may be an emergency. Work with your health care provider to make and change your meal plan as needed. This can help prevent low blood sugar. What can increase my risk? You may be more likely to get low blood sugar if: You take insulin  or other diabetes medicines. You skip or delay a meal or snack. You get sick. How can low blood sugar affect me? Mild symptoms Mild cases may not cause symptoms. If you do have symptoms, they may include: Hunger or feeling like you may vomit. Sweating and feeling cold to the touch. Feeling dizzy or light-headed. Being sleepy or having trouble sleeping. A fast heart rate. A headache. Blurry eyesight. Mood changes. These include feeling worried, nervous, or easily annoyed. Tingling or numbness around your mouth, lips, or tongue. If a mild  case of low blood sugar isn't treated, it can become moderate or severe. Moderate symptoms If you have a moderate case, you may: Feel confused. Have changes in the way you act or move. Feel weak. Have an uneven heartbeat. Severe symptoms Having very low blood sugar is an emergency. It can cause: Fainting. Seizures. A coma. Death. What nutrition changes can I make? Work with your provider or an expert in healthy eating called a dietitian to make a meal plan. Eat meals at set times. Have snacks between meals, as told by your provider. Donot skip or delay meals or snacks. What other actions can I take to prevent low blood sugar?  Work closely with your provider to manage your blood sugar. Make sure you know: What your blood sugar should be. How and when to check your blood sugar. The symptoms of low blood sugar. Be sure to eat food when you drink alcohol. When you're sick, check your blood sugar more often. Make a sick day plan in advance with your provider. Follow this plan when you can't eat or drink like normal. Always check your blood sugar before, during, and after exercise. How is this treated? Treating low blood sugar If you have low blood sugar, eat or drink something with sugar in it right away. The food or drink should have 15 grams of a fast-acting carbohydrate (carb). Options include: 4 oz (120 mL) of fruit juice. 4 oz (120 mL) of soda (not diet soda). A few pieces of hard candy. Check food labels to see how many pieces to eat. 1 Tbsp (15  mL) of sugar or honey. 4 glucose tablets. 1 tube of glucose gel. Treating low blood sugar if you have diabetes If you're alert and can swallow safely, follow the 15:15 rule: Take 15 grams of a fast-acting carb. Talk with your provider about how much carb you should take. Check your blood sugar 15 minutes after you take the carb. If your blood sugar is still at or below 70 mg/dL (3.9 mmol/L), take 15 grams of a carb again. If your  blood sugar doesn't go above 70 mg/dL (3.9 mmol/L) after 3 tries, get help right away. After your blood sugar goes back to normal, eat a meal or a snack within 1 hour. Treating very low blood sugar If your blood sugar is less than 54 mg/dL (3 mmol/L), it's an emergency. Get help right away. If you can't eat or drink, you will need to be given glucagon. A family member or friend should learn how to check your blood sugar and give you glucagon. Ask your provider if you should keep a glucagon kit at home. You may also need to be treated in a hospital. Where to find more information American Diabetes Association (ADA): diabetes.[employer] of Diabetes and Digestive and Kidney Diseases (NIDDK): [employer] Association of Diabetes Care & Education Specialists: diabeteseducator.org Contact a health care provider if: You have diabetes and are having trouble keeping your blood sugar in the right range. You have low blood sugar often. Get help right away if: You can't get your blood sugar above 70 mg/dL (3.9 mmol/L) after 3 tries. Your blood sugar is below 54 mg/dL (3 mmol/L). You faint. You have a seizure. These symptoms may be an emergency. Call 911 right away. Do not wait to see if the symptoms will go away. Do not drive yourself to the hospital. This information is not intended to replace advice given to you by your health care provider. Make sure you discuss any questions you have with your health care provider. Document Revised: 09/20/2022 Document Reviewed: 09/20/2022 Elsevier Patient Education  2024 [employer].

## 2023-11-07 NOTE — Patient Outreach (Signed)
 Complex Care Management   Visit Note  11/07/2023  Name:  Melissa Schneider MRN: [id number] DOB: 21-Jan-1956  Situation: Referral received for Complex Care Management related to Diabetes with Complications I obtained verbal consent from Patient.  Visit completed with patient  on the phone  Background:   Past Medical History:  Diagnosis Date   Anemia 07/23/2011   Anxiety    Diabetes mellitus    Diabetic gastroparesis (HCC) 07/23/2011   Suspected   Headache(784.0)    Hearing difficulty    Hyperlipidemia    Hypothyroidism    Kidney stones    Microcytic anemia 07/23/2011   Thalassemia minor    ????? not really worked up per pt but daughter has it???    Assessment: Patient Reported Symptoms:  Cognitive Cognitive Status: Alert and oriented to person, place, and time   Health Maintenance Behaviors: Annual physical exam Healing Pattern: Average Health Facilitated by: Rest  Neurological Neurological Review of Symptoms: No symptoms reported Neurological Self-Management Outcome: 4 (good)  HEENT HEENT Symptoms Reported: No symptoms reported HEENT Management Strategies: Routine screening HEENT Self-Management Outcome: 4 (good)    Cardiovascular Cardiovascular Symptoms Reported: No symptoms reported Does patient have uncontrolled Hypertension?: No Cardiovascular Conditions: Hypertension, High blood cholesterol Cardiovascular Management Strategies: Medication therapy, Routine screening Cardiovascular Self-Management Outcome: 4 (good)  Respiratory Respiratory Symptoms Reported: No symptoms reported Respiratory Conditions: Seasonal allergies Respiratory Self-Management Outcome: 4 (good)  Endocrine Patient reports the following symptoms related to hypoglycemia or hyperglycemia : No symptoms reported Is patient diabetic?: Yes Is patient checking blood sugars at home?: Yes Endocrine Conditions: Thyroid  disorder, Diabetes Endocrine Management Strategies: Medication therapy, Routine screening,  Diet modification Endocrine Self-Management Outcome: 4 (good)  Gastrointestinal Gastrointestinal Symptoms Reported: No symptoms reported Gastrointestinal Self-Management Outcome: 4 (good) Nutrition Risk Screen (CP): No indicators present  Genitourinary Genitourinary Symptoms Reported: No symptoms reported Genitourinary Self-Management Outcome: 4 (good)  Integumentary Integumentary Symptoms Reported: No symptoms reported Skin Self-Management Outcome: 4 (good)  Musculoskeletal Musculoskelatal Symptoms Reviewed: No symptoms reported Musculoskeletal Self-Management Outcome: 4 (good) Falls in the past year?: No Number of falls in past year: 1 or less Was there an injury with Fall?: No Fall Risk Category Calculator: 0 Patient Fall Risk Level: Low Fall Risk Patient at Risk for Falls Due to: No Fall Risks Fall risk Follow up: Falls evaluation completed  Psychosocial Psychosocial Symptoms Reported: No symptoms reported Behavioral Health Self-Management Outcome: 4 (good) Techniques to Cope with Loss/Stress/Change: Not applicable Quality of Family Relationships: helpful, involved, supportive Do you feel physically threatened by others?: No      11/07/2023    9:48 AM  Depression screen PHQ 2/9  Decreased Interest 0  Down, Depressed, Hopeless 0  PHQ - 2 Score 0    There were no vitals filed for this visit.  Medications Reviewed Today     Reviewed by Remona Carmel, RN (Registered Nurse) on 11/07/23 at 0930  Med List Status: <None>   Medication Order Taking? Sig Documenting Provider Last Dose Status Informant  Aloe Vera 72 % CREA [id number] No Apply 1 Application topically 2 (two) times daily as needed.  Patient not taking: Reported on 11/07/2023   Anton Baton, NP Not Taking Consider Medication Status and Discontinue   aspirin 81 MG tablet [id number] Yes Take 81 mg by mouth daily. [provider] Taking Active   buPROPion  (WELLBUTRIN  SR) 150 MG 12 hr tablet  [id number] Yes Take 1 tablet (150 mg total) by mouth in the morning. Vicky Grange M, DO  Taking Active   Capsaicin-Menthol-Methyl Sal (CAPSAICIN-METHYL SAL-MENTHOL) 0.025-1-12 % CREA [id number] No Apply 1 Application topically 2 (two) times daily as needed.  Patient not taking: Reported on 10/03/2023   Anton Baton, NP Not Taking Consider Medication Status and Discontinue   Continuous Glucose Sensor (FREESTYLE LIBRE 3 SENSOR) [place] [id number] Yes CHANGE EVERY 14 DAYS Vicky Grange M, DO Taking Active   cyclobenzaprine  (FLEXERIL ) 10 MG tablet [id number] No Take 1 tablet (10 mg total) by mouth 3 (three) times daily as needed for muscle spasms.  Patient not taking: Reported on 11/07/2023   Dettinger, Lucio Sabin, MD Not Taking Consider Medication Status and Discontinue   docusate sodium  (COLACE) 100 MG capsule [id number] Yes Take 200 mg by mouth daily as needed.  [provider] Taking Active   glucose blood (GNP EASY TOUCH GLUCOSE TEST) test strip [id number] Yes EVERY DAY Vicky Grange M, DO Taking Active   insulin  glargine (LANTUS ) 100 UNIT/ML injection [id number] Yes INJECT 0.22-0.3ML (22-30 UNITS) INTO THESKIN AT BEDTIME Eliodoro Guerin, DO Taking Active            Med Note Oletta Berry, Mubarak Bevens M   Mon Oct 07, 2023  1:19 PM) Patient taking differently. Injecting 6 units at bedtime.  Insulin  Pen Needle (PEN NEEDLES) 32G X 4 MM MISC [id number] Yes UAD E11.69 Vicky Grange M, DO Taking Active   Insulin  Syringe-Needle U-100 (INSULIN  SYRINGE .5CC/31GX5/16") 31G X 5/16" 0.5 ML MISC [id number] Yes UAD with Lantus  E11.69 Vicky Grange M, DO Taking Active   levocetirizine (XYZAL ) 5 MG tablet [id number] No Take 1 tablet (5 mg total) by mouth at bedtime as needed for allergies (itching/ rash).  Patient not taking: Reported on 11/07/2023   Eliodoro Guerin, DO Not Taking Active   levothyroxine  (SYNTHROID ) 137 MCG tablet [id number] Yes TAKE ONE TABLET [facility] BEFORE BREAKFAST  Vicky Grange M, [place] Taking Active   losartan  (COZAAR ) 25 MG tablet [id number] Yes TAKE ONE (1) TABLET EACH DAY [place], [place], DO Taking Active   Multiple Vitamin (MULTIVITAMIN WITH MINERALS) TABS tablet [id number] No Take 1 tablet by mouth daily.  Patient not taking: Reported on 11/07/2023   [provider] Not Taking Active Self  nitroGLYCERIN  (NITROSTAT ) 0.4 MG SL tablet [id number] No Place 1 tablet (0.4 mg total) under the tongue every 5 (five) minutes as needed for chest pain.  Patient not taking: Reported on 11/07/2023   Lasalle Pointer, NP Not Taking Active   NOVOLOG  FLEXPEN 100 UNIT/ML FlexPen [id number] Yes INJECT 10-17 UNITS INTO THE SKIN 3 TIMESDAILY WITH MEALS Vicky Grange M, DO Taking Active   pantoprazole  (PROTONIX ) 40 MG tablet [id number] Yes Take 1 tablet (40 mg total) by mouth daily. X1 month. Then take daily as needed for reflux Vicky Grange M, DO Taking Active   rosuvastatin  (CRESTOR ) 20 MG tablet [id number] Yes TAKE ONE (1) TABLET BY MOUTH EVERY DAY Vicky Grange M, DO Taking Active   Semaglutide , 2 MG/DOSE, 8 MG/3ML SOPN [id number] Yes Inject 2 mg as directed once a week. Vicky Grange M, DO Taking Active   SUMAtriptan  (IMITREX ) 100 MG tablet [id number] Yes MAY REPEAT IN 2 HOURS IF HEADACHE PERSISTS OR RECURS Vicky Grange M, DO Taking Active   triamcinolone  cream (KENALOG ) 0.1 % [id number] No Apply 1 application. topically 2 (two) times daily. X7-10 days for rash  Patient not taking: Reported on 11/07/2023   Eliodoro Guerin, DO Not Taking Consider Medication Status and Discontinue  Recommendation:   Referral to: PharmD for disease management  Follow Up Plan:   Telephone follow up appointment date/time:  12-05-2023 at 9:00 am  Grandville Lax, BSN RN [facility], [facility] RN Care Manager Direct Dial: [phone]  Fax: [phone]

## 2023-11-12 ENCOUNTER — Telehealth: Payer: Self-pay

## 2023-11-12 NOTE — Progress Notes (Signed)
 Care Guide Pharmacy Note  11/12/2023 Name: Melissa Schneider MRN: [id number] DOB: 1956/04/06  Referred By: Eliodoro Guerin, DO Reason for referral: Complex Care Management (Outreach to schedule with Pharm d )   Melissa Schneider is a 68 y.o. year old female who is a primary care patient of Eliodoro Guerin, DO.  Melissa Schneider was referred to the pharmacist for assistance related to: DMII  Successful contact was made with the patient to discuss pharmacy services including being ready for the pharmacist to call at least 5 minutes before the scheduled appointment time and to have medication bottles and any blood pressure readings ready for review. The patient agreed to meet with the pharmacist via telephone visit on (date/time).11/13/2023  Lenton Rail , RMA     [place]  [facility], [facility] Guide  Direct Dial: [phone]  Website: [email or web address]

## 2023-11-13 ENCOUNTER — Other Ambulatory Visit: Payer: Self-pay

## 2023-11-13 ENCOUNTER — Encounter: Payer: Self-pay | Admitting: Pharmacist

## 2023-11-13 ENCOUNTER — Other Ambulatory Visit

## 2023-11-13 DIAGNOSIS — Z1211 Encounter for screening for malignant neoplasm of colon: Secondary | ICD-10-CM

## 2023-11-13 NOTE — Progress Notes (Deleted)
   11/13/2023 Name: Melissa Schneider MRN: [id number] DOB: [birth year]  No chief complaint on file.   {Visit Type:26650}   Subjective:  Reports using less insulin   Only 4 units at night Using 4-6 units with meal Reports 98 this AM   Care Team: Primary Care Provider: Eliodoro Guerin, DO ; Next Scheduled Visit: *** {careteamprovider:27366}  Medication Access/Adherence  Current Pharmacy:  [facility] - Eulene Hickman, [place] - [address] [place] [address] Phone: [phone] Fax: [phone]  [address] [place] (New Address) - [place], [place] - [address] [facility] AT Previously: [address], [place] [address] [facility] [address] 4[address] [place] [address] Phone: [phone] Fax: [phone]   Patient reports affordability concerns with their medications: {YES/NO:21197} Patient reports access/transportation concerns to their pharmacy: {YES/NO:21197} Patient reports adherence concerns with their medications:  {YES/NO:21197} ***   Diabetes:  Current medications: Ozempic  Medications tried in the past:   Current glucose readings: *** Using *** meter; testing *** times daily  Date of Download: *** % Time CGM is active: ***% Average Glucose: *** mg/dL Glucose Management Indicator: ***  Glucose Variability: *** (goal <36%) Time in Goal:  - Time in range 70-180: ***% - Time above range: ***% - Time below range: ***% Observed patterns:  Patient {Actions; denies-reports:120008} hypoglycemic s/sx including ***dizziness, shakiness, sweating. Patient {Actions; denies-reports:120008} hyperglycemic symptoms including ***polyuria, polydipsia, polyphagia, nocturia, neuropathy, blurred vision.  Current meal patterns:  - Breakfast: *** - Lunch *** - Supper *** - Snacks *** - Drinks ***  Current physical activity: ***  Current medication access support: ***   Objective:  Lab Results  Component  Value Date   HGBA1C 7.7 (H) 08/30/2023    Lab Results  Component Value Date   CREATININE 0.81 09/19/2023   BUN 13 09/19/2023   NA 138 09/19/2023   K 4.0 09/19/2023   CL 104 09/19/2023   CO2 25 09/19/2023    Lab Results  Component Value Date   CHOL 168 01/07/2023   HDL 81 01/07/2023   LDLCALC 75 01/07/2023   LDLDIRECT 130 (H) 10/03/2022   TRIG 60 01/07/2023   CHOLHDL 2.1 01/07/2023    Medications Reviewed Today   Medications were not reviewed in this encounter       Assessment/Plan:   {Pharmacy A/P Choices:26421} Stop meal time Only give if >200 after meals 4 units lantus   Likely drop When increase to 2mg  ozempic , stop all insulin  Follow Up Plan: ***  ***

## 2023-11-14 ENCOUNTER — Other Ambulatory Visit (INDEPENDENT_AMBULATORY_CARE_PROVIDER_SITE_OTHER): Payer: Self-pay | Admitting: Pharmacist

## 2023-11-14 DIAGNOSIS — Z794 Long term (current) use of insulin: Secondary | ICD-10-CM

## 2023-11-14 DIAGNOSIS — Z7985 Long-term (current) use of injectable non-insulin antidiabetic drugs: Secondary | ICD-10-CM

## 2023-11-14 DIAGNOSIS — E119 Type 2 diabetes mellitus without complications: Secondary | ICD-10-CM

## 2023-11-18 ENCOUNTER — Encounter: Payer: Self-pay | Admitting: Nurse Practitioner

## 2023-11-18 ENCOUNTER — Ambulatory Visit: Attending: Nurse Practitioner | Admitting: Nurse Practitioner

## 2023-11-18 VITALS — BP 112/70 | HR 80 | Ht 62.0 in | Wt 188.8 lb

## 2023-11-18 DIAGNOSIS — R911 Solitary pulmonary nodule: Secondary | ICD-10-CM

## 2023-11-18 DIAGNOSIS — R002 Palpitations: Secondary | ICD-10-CM | POA: Diagnosis not present

## 2023-11-18 DIAGNOSIS — I2510 Atherosclerotic heart disease of native coronary artery without angina pectoris: Secondary | ICD-10-CM

## 2023-11-18 DIAGNOSIS — E669 Obesity, unspecified: Secondary | ICD-10-CM

## 2023-11-18 DIAGNOSIS — I10 Essential (primary) hypertension: Secondary | ICD-10-CM

## 2023-11-18 DIAGNOSIS — R0609 Other forms of dyspnea: Secondary | ICD-10-CM

## 2023-11-18 DIAGNOSIS — E785 Hyperlipidemia, unspecified: Secondary | ICD-10-CM

## 2023-11-18 NOTE — Patient Instructions (Addendum)

## 2023-11-18 NOTE — Progress Notes (Unsigned)
 Office Visit    Patient Name: Melissa Schneider Date of Encounter: 11/18/2023 PCP:  Eliodoro Guerin DO [facility]  Cardiologist:  Armida Lander, MD Advanced Practice Provider:  Lasalle Pointer, NP Electrophysiologist:  None   Chief Complaint and HPI    Melissa Schneider is a 68 y.o. female with a hx of CAD, history of chest pain, T2DM, HLD, and HTN who presents today for scheduled follow-up.   Previous negative NST in 2018, history of minimal CAD seen via coronary CTA in 2021, coronary calcium  score of 1.  Last seen by Dr. Armida Lander on March 28, 2021.  Was doing well at the time.   I last saw her on September 17, 2022. Was doing well at the time.   ED visit on September 19, 2023 for atypical chest pain.  Workup was overall unremarkable however CTA of chest revealed no evidence of PE/aortic dissection with minimal aortic and coronary artery atherosclerosis, slight cardiomegaly, linear intermixed with ground-glass opacities in basal segments of both lower lobes and in lingular base, which could all be due to atelectasis/combination of atelectasis and pneumonia.  7 mm noncalcified nodule noted in the anterior segment of the right upper lobe.  Recommended to repeat CT of chest in 6 to 12 months.  Today she presents for regular follow-up and chest pain evaluation.  She notes sensation of mild/moderate dull ache along left side of chest, describes as deep along left breast tissue, typically notices in the evenings before bedtime, has been ongoing within the past few weeks.  She says her symptoms do seem to be better after being started on Protonix .  Does admit to some dyspnea on exertion has been ongoing for some time.  Typically noted when going up and down stairs or when working outside, says she has to rest for symptoms to be improved.  She has been having palpitations along with this, has been chronic and ongoing for the last 1 to 2 years.  She has some questions  today regarding her recent CT scan performed in the ED. Denies any syncope, presyncope, dizziness, orthopnea, PND, swelling or significant weight changes, acute bleeding, or claudication.  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  Volunteers with [employer].  EKGs/Labs/Other Studies Reviewed:   The following studies were reviewed today:   EKG:  EKG is not ordered today.  EKG dated September 19, 2023 was reviewed and showed normal sinus rhythm, 80 bpm, unremarkable EKG.  CCTA 08/2019: IMPRESSION: 1. Minimal CAD, CADRADS = 1.   2. The patient's coronary artery calcium  score is 10, which places the patient in the 67th percentile for age and sex matched controls.   3. Normal coronary origin with right dominance.   Lexiscan  01/2017: No diagnostic ST segment changes to indicate ischemia. Low risk Duke treadmill score 4.5. Blood pressure demonstrated a hypertensive response to exercise. Small, mild intensity, fixed anteroapical defect consistent with breast attenuation. This is a low risk study. Nuclear stress EF: 76%.   ETT 01/2016: inalized by Elmyra Haggard, MD on Mon Jan 23, 2016  3:32 PM There was no ST segment deviation noted during stress. No T wave inversion was noted during stress. Pressure rate product is 20, 701. Test at marginal workload For heart rate achieved, there were no ischemic EKG changes Conclusion limited.  Echo 08/2015: Study Conclusions   - Left ventricle: The cavity size was normal. Wall thickness was at    the  upper limits of normal. Systolic function was normal. The    estimated ejection fraction was in the range of 60% to 65%. Wall    motion was normal; there were no regional wall motion    abnormalities. Left ventricular diastolic function parameters    were normal.  - Ascending aorta: The ascending aorta was mildly ectatic.  - Mitral valve: There was trivial regurgitation.  - Right atrium: Central venous pressure (est): 3  mm Hg.  - Tricuspid valve: There was trivial regurgitation.  - Pulmonary arteries: PA peak pressure: 23 mm Hg (S).  - Pericardium, extracardiac: There was no pericardial effusion.   Impressions:   - Upper normal LV wall thickness with LVEF 60-65% and grossly    normal diastolic function. Mildly ectatic ascending aorta.    Trivial mitral and tricuspid regurgitation. Estimated PASP 23    mmHg.   Recen Risk Assessment/Calculations:    The 10-year ASCVD risk score (Arnett DK, et al., 2019) is: 10.9%   Values used to calculate the score:     Age: 55 years     Sex: Female     Is Non-Hispanic African American: No     Diabetic: Yes     Tobacco smoker: No     Systolic Blood Pressure: 112 mmHg     Is BP treated: Yes     HDL Cholesterol: 81 mg/dL     Total Cholesterol: 168 mg/dL  Review of Systems    All other systems reviewed and are otherwise negative except as noted above.  Physical Exam    VS:  BP 112/70   Pulse 80   Ht 5\' 2"  (1.575 m)   Wt 188 lb 12.8 oz (85.6 kg)   SpO2 98%   BMI 34.53 kg/m  , BMI Body mass index is 34.53 kg/m.  Wt Readings from Last 3 Encounters:  11/18/23 188 lb 12.8 oz (85.6 kg)  10/03/23 191 lb (86.6 kg)  09/27/23 191 lb (86.6 kg)     GEN: Obese, 68 y.o. female in no acute distress. HEENT: normal. Neck: Supple, no JVD, carotid bruits, or masses. Cardiac: S1/S2, RRR, no murmurs, rubs, or gallops. No clubbing, cyanosis, edema. Radials/PT 2+ and equal bilaterally.  Respiratory:  Respirations regular and unlabored, clear to auscultation bilaterally. GI: Soft, nontender, nondistended. MS: No deformity or atrophy. Skin: Warm and dry, no rash. Neuro:  Strength and sensation are intact. Psych: Normal affect.  Assessment & Plan    CAD, atypical CP Admits to improvement in chest pain within the past few weeks, etiology atypical.  Recent workup in ED was overall unremarkable, ruled out for ACS.  CCTA in 2021 showed minimal CAD, coronary calcium   score was 10.  No indication for ischemic evaluation at this time.  Continue aspirin, losartan , rosuvastatin .  Will prescribe nitroglycerin  as needed for chest pain.  I educated her regarding this medicine and she verbalized understanding. Heart healthy diet and regular cardiovascular exercise encouraged.  Care and ED precautions discussed.  HTN Blood pressure stable.  Continue current medication regimen. Heart healthy diet and regular cardiovascular exercise encouraged.   HLD LDL 75 in June 2024.  Continue rosuvastatin . Heart healthy diet and regular cardiovascular exercise encouraged.   4.  Dyspnea on exertion, palpitations Etiology unclear.  Palpitations improve with rest.  Will arrange echocardiogram for further evaluation for her DOE.  Continue current medication regimen.  Continue follow-up with PCP.  Care and ED precautions discussed.  Discussed lifestyle changes.  Discussed to avoid caffeine.  If  no improvement with her palpitations by next follow-up visit, plan to arrange monitor.  5.  Obesity  Weight loss via diet and exercise encouraged. Discussed the impact being overweight would have on cardiovascular risk.  6. Pulmonary nodule Recent CTA of chest revealed no evidence of PE/aortic dissection with minimal aortic and coronary artery atherosclerosis, slight cardiomegaly, linear intermixed with ground-glass opacities in basal segments of both lower lobes and in lingular base, which could all be due to atelectasis/combination of atelectasis and pneumonia.  7 mm noncalcified nodule noted in the anterior segment of the right upper lobe.  Went over and reviewed these findings with patient.  She verbalized understanding.  Recommended to repeat CT of chest in 6 to 12 months, plan to arrange next year. Continue to follow with PCP.  Disposition: Follow up in 6-8 weeks with Armida Lander, MD or APP.  Signed, Lasalle Pointer, NP

## 2023-11-20 ENCOUNTER — Encounter: Payer: Self-pay | Admitting: *Deleted

## 2023-12-04 ENCOUNTER — Other Ambulatory Visit: Payer: Self-pay | Admitting: Family Medicine

## 2023-12-04 ENCOUNTER — Telehealth: Payer: Self-pay

## 2023-12-04 DIAGNOSIS — R42 Dizziness and giddiness: Secondary | ICD-10-CM | POA: Diagnosis not present

## 2023-12-04 DIAGNOSIS — K219 Gastro-esophageal reflux disease without esophagitis: Secondary | ICD-10-CM

## 2023-12-04 DIAGNOSIS — E1169 Type 2 diabetes mellitus with other specified complication: Secondary | ICD-10-CM

## 2023-12-04 NOTE — Progress Notes (Signed)
 Complex Care Management Care Guide Note  12/04/2023 Name: Melissa Schneider MRN: [id number] DOB: 1956-04-22  [name] is a 68 y.o. year old female who is a primary care patient of Eliodoro Guerin, DO and is actively engaged with the care management team. I reached out to Leah Primus by phone today to assist with re-scheduling  with the Pharmacist.  Follow up plan: Unsuccessful telephone outreach attempt made. A HIPAA compliant phone message was left for the patient providing contact information and requesting a return call.  Lenton Rail , RMA     [facility]  [facility], [facility] Guide  Direct Dial: [phone]  Website: [email or web address]

## 2023-12-05 ENCOUNTER — Other Ambulatory Visit: Payer: Self-pay | Admitting: *Deleted

## 2023-12-05 NOTE — Patient Instructions (Signed)
 Visit Information  Thank you for taking time to visit with me today. Please don't hesitate to contact me if I can be of assistance to you before our next scheduled appointment.  Your next care management appointment is by telephone on 01-02-2024 at 9:00 am  Telephone follow-up in 1 month  Please call the care guide team at [phone] if you need to cancel, schedule, or reschedule an appointment.   Please call the Suicide and Crisis Lifeline: 988 call the [place]  National Suicide Prevention Lifeline: 450-131-0203 or TTY: 702 259 6643 TTY (754)865-0922) to talk to a trained counselor call 1-800-273-TALK (toll free, 24 hour hotline) call the [facility]: [phone] call 911 if you are experiencing a Mental Health or Behavioral Health Crisis or need someone to talk to.  Grandville Lax, BSN RN [facility], [facility] RN Care Manager Direct Dial: [phone]  Fax: [phone]  Carbohydrate Counting for Diabetes Mellitus, Adult Carbohydrate counting is a method of keeping track of how many carbohydrates you eat. Eating carbohydrates increases the amount of sugar (glucose) in the blood. Counting how many carbohydrates you eat improves how well you manage your blood glucose. This, in turn, helps you manage your diabetes. Carbohydrates are measured in grams (g) per serving. It is important to know how many carbohydrates (in grams or by serving size) you can have in each meal. This is different for every person. A dietitian can help you make a meal plan and calculate how many carbohydrates you should have at each meal and snack. What foods contain carbohydrates? Carbohydrates are found in the following foods: Grains, such as breads and cereals. Dried beans and soy products. Starchy vegetables, such as potatoes, peas, and corn. Fruit and fruit juices. Milk and yogurt. Sweets and snack foods, such as cake, cookies, candy, chips, and soft  drinks. How do I count carbohydrates in foods? There are two ways to count carbohydrates in food. You can read food labels or learn standard serving sizes of foods. You can use either of these methods or a combination of both. Using the Nutrition Facts label The Nutrition Facts list is included on the labels of almost all packaged foods and beverages in the [place] . It includes: The serving size. Information about nutrients in each serving, including the grams of carbohydrate per serving. To use the Nutrition Facts, decide how many servings you will have. Then, multiply the number of servings by the number of carbohydrates per serving. The resulting number is the total grams of carbohydrates that you will be having. Learning the standard serving sizes of foods When you eat carbohydrate foods that are not packaged or do not include Nutrition Facts on the label, you need to measure the servings in order to count the grams of carbohydrates. Measure the foods that you will eat with a food scale or measuring cup, if needed. Decide how many standard-size servings you will eat. Multiply the number of servings by 15. For foods that contain carbohydrates, one serving equals 15 g of carbohydrates. For example, if you eat 2 cups or 10 oz (300 g) of strawberries, you will have eaten 2 servings and 30 g of carbohydrates (2 servings x 15 g = 30 g). For foods that have more than one food mixed, such as soups and casseroles, you must count the carbohydrates in each food that is included. The following list contains standard serving sizes of common carbohydrate-rich foods. Each of these servings has about 15 g of carbohydrates: 1  slice of bread. 1 six-inch (15 cm) tortilla. ? cup or 2 oz (53 g) cooked rice or pasta.  cup or 3 oz (85 g) cooked or canned, drained and rinsed beans or lentils.  cup or 3 oz (85 g) starchy vegetable, such as peas, corn, or squash.  cup or 4 oz (120 g) hot cereal.  cup or 3  oz (85 g) boiled or mashed potatoes, or  or 3 oz (85 g) of a large baked potato.  cup or 4 fl oz (118 mL) fruit juice. 1 cup or 8 fl oz (237 mL) milk. 1 small or 4 oz (106 g) apple.  or 2 oz (63 g) of a medium banana. 1 cup or 5 oz (150 g) strawberries. 3 cups or 1 oz (28.3 g) popped popcorn. What is an example of carbohydrate counting? To calculate the grams of carbohydrates in this sample meal, follow the steps shown below. Sample meal 3 oz (85 g) chicken breast. ? cup or 4 oz (106 g) brown rice.  cup or 3 oz (85 g) corn. 1 cup or 8 fl oz (237 mL) milk. 1 cup or 5 oz (150 g) strawberries with sugar-free whipped topping. Carbohydrate calculation Identify the foods that contain carbohydrates: Rice. Corn. Milk. Strawberries. Calculate how many servings you have of each food: 2 servings rice. 1 serving corn. 1 serving milk. 1 serving strawberries. Multiply each number of servings by 15 g: 2 servings rice x 15 g = 30 g. 1 serving corn x 15 g = 15 g. 1 serving milk x 15 g = 15 g. 1 serving strawberries x 15 g = 15 g. Add together all of the amounts to find the total grams of carbohydrates eaten: 30 g + 15 g + 15 g + 15 g = 75 g of carbohydrates total. What are tips for following this plan? Shopping Develop a meal plan and then make a shopping list. Buy fresh and frozen vegetables, fresh and frozen fruit, dairy, eggs, beans, lentils, and whole grains. Look at food labels. Choose foods that have more fiber and less sugar. Avoid processed foods and foods with added sugars. Meal planning Aim to have the same number of grams of carbohydrates at each meal and for each snack time. Plan to have regular, balanced meals and snacks. Where to find more information American Diabetes Association: diabetes.org Centers for Disease Control and Prevention: TonerPromos.no Academy of Nutrition and Dietetics: eatright.org Association of Diabetes Care & Education Specialists:  diabeteseducator.org Summary Carbohydrate counting is a method of keeping track of how many carbohydrates you eat. Eating carbohydrates increases the amount of sugar (glucose) in your blood. Counting how many carbohydrates you eat improves how well you manage your blood glucose. This helps you manage your diabetes. A dietitian can help you make a meal plan and calculate how many carbohydrates you should have at each meal and snack. This information is not intended to replace advice given to you by your health care provider. Make sure you discuss any questions you have with your health care provider. Document Revised: 02/02/2020 Document Reviewed: 02/03/2020 Elsevier Patient Education  2024 [employer].

## 2023-12-05 NOTE — Patient Outreach (Signed)
 Complex Care Management   Visit Note  12/05/2023  Name:  Melissa Schneider MRN: [id number] DOB: [birth year]  Situation: Referral received for Complex Care Management related to Diabetes with Complications I obtained verbal consent from Patient.  Visit completed with patient  on the phone  Background:   Past Medical History:  Diagnosis Date   Anemia 07/23/2011   Anxiety    Diabetes mellitus    Diabetic gastroparesis (HCC) 07/23/2011   Suspected   Headache(784.0)    Hearing difficulty    Hyperlipidemia    Hypothyroidism    Kidney stones    Microcytic anemia 07/23/2011   Thalassemia minor    ????? not really worked up per pt but daughter has it???    Assessment: Patient Reported Symptoms:  Cognitive Cognitive Status: Alert and oriented to person, place, and time, Insightful and able to interpret abstract concepts, Normal speech and language skills Cognitive/Intellectual Conditions Management [RPT]: None reported or documented in medical history or problem list   Health Maintenance Behaviors: Annual physical exam  Neurological Neurological Review of Symptoms: No symptoms reported    HEENT HEENT Symptoms Reported: No symptoms reported      Cardiovascular Cardiovascular Symptoms Reported: No symptoms reported Cardiovascular Conditions: High blood cholesterol, Hypertension Cardiovascular Management Strategies: Medication therapy, Routine screening  Respiratory Respiratory Symptoms Reported: No symptoms reported Respiratory Conditions: Seasonal allergies  Endocrine Patient reports the following symptoms related to hypoglycemia or hyperglycemia : No symptoms reported Is patient diabetic?: Yes Is patient checking blood sugars at home?: Yes Endocrine Conditions: Thyroid  disorder, Diabetes Endocrine Management Strategies: Medication therapy, Routine screening  Gastrointestinal Gastrointestinal Symptoms Reported: No symptoms reported   Nutrition Risk Screen (CP): No indicators present   Genitourinary Genitourinary Symptoms Reported: No symptoms reported    Integumentary Integumentary Symptoms Reported: No symptoms reported    Musculoskeletal Musculoskelatal Symptoms Reviewed: No symptoms reported   Falls in the past year?: No Number of falls in past year: 1 or less Was there an injury with Fall?: No Fall Risk Category Calculator: 0 Patient Fall Risk Level: Low Fall Risk Patient at Risk for Falls Due to: No Fall Risks Fall risk Follow up: Falls evaluation completed  Psychosocial Psychosocial Symptoms Reported: No symptoms reported   Techniques to Cope with Loss/Stress/Change: Not applicable Quality of Family Relationships: helpful, involved, supportive Do you feel physically threatened by others?: No      12/05/2023    9:27 AM  Depression screen PHQ 2/9  Decreased Interest 0  Down, Depressed, Hopeless 0  PHQ - 2 Score 0    There were no vitals filed for this visit.  Medications Reviewed Today     Reviewed by Remona Carmel, RN (Registered Nurse) on 12/05/23 at [phone]  Med List Status: <None>   Medication Order Taking? Sig Documenting Provider Last Dose Status Informant  Aloe Vera 72 % CREA [id number] No Apply 1 Application topically 2 (two) times daily as needed.  Patient not taking: Reported on 10/07/2023   Anton Baton, NP Not Taking Active   aspirin 81 MG tablet [id number] No Take 81 mg by mouth daily. [provider] Taking Active   buPROPion  (WELLBUTRIN  SR) 150 MG 12 hr tablet [id number] No Take 1 tablet (150 mg total) by mouth in the morning. Vicky Grange M, DO Taking Active   Capsaicin-Menthol-Methyl Sal (CAPSAICIN-METHYL SAL-MENTHOL) 0.025-1-12 % CREA [id number] No Apply 1 Application topically 2 (two) times daily as needed.  Patient not taking: Reported on 11/18/2023   [facility],  NP Not Taking Active   Continuous Glucose Sensor (FREESTYLE LIBRE 3 SENSOR) MISC [id number] No CHANGE EVERY 14 DAYS Vicky Grange  M, DO Taking Active   cyclobenzaprine  (FLEXERIL ) 10 MG tablet [id number] No Take 1 tablet (10 mg total) by mouth 3 (three) times daily as needed for muscle spasms.  Patient not taking: Reported on 10/07/2023   Dettinger, Lucio Sabin, MD Not Taking Active   docusate sodium  (COLACE) 100 MG capsule [id number] No Take 200 mg by mouth daily as needed.  [provider] Taking Active   glucose blood (GNP EASY TOUCH GLUCOSE TEST) test strip [id number] No EVERY DAY Vicky Grange M, DO Taking Active   insulin  glargine (LANTUS ) 100 UNIT/ML injection [id number]  INJECT 0.22-0.3ML (22-30 UNITS) INTO THESKIN AT BEDTIME Vicky Grange M, DO  Active   Insulin  Pen Needle (PEN NEEDLES) 32G X 4 MM MISC [id number] No UAD E11.69 Vicky Grange M, DO Taking Active   Insulin  Syringe-Needle U-100 (INSULIN  SYRINGE .5CC/31GX5/16") 31G X 5/16" 0.5 ML MISC [id number] No UAD with Lantus  E11.69 Vicky Grange M, DO Taking Active   levocetirizine (XYZAL ) 5 MG tablet [id number] No Take 1 tablet (5 mg total) by mouth at bedtime as needed for allergies (itching/ rash).  Patient not taking: Reported on 11/18/2023   Eliodoro Guerin, DO Not Taking Active   levothyroxine  (SYNTHROID ) 137 MCG tablet [id number] No TAKE ONE TABLET [facility] BEFORE BREAKFAST Vicky Grange M, [place] Taking Active   losartan  (COZAAR ) 25 MG tablet [id number] No TAKE ONE (1) TABLET EACH DAY [place], [place], DO Taking Active   Multiple Vitamin (MULTIVITAMIN WITH MINERALS) TABS tablet [id number] No Take 1 tablet by mouth daily.  Patient not taking: Reported on 11/07/2023   [provider] Not Taking Active Self  nitroGLYCERIN  (NITROSTAT ) 0.4 MG SL tablet [id number] No Place 1 tablet (0.4 mg total) under the tongue every 5 (five) minutes as needed for chest pain.  Patient not taking: Reported on 11/18/2023   Lasalle Pointer, NP Not Taking Active   NOVOLOG  FLEXPEN 100 UNIT/ML FlexPen [id number]  INJECT 10-17 UNITS INTO THE SKIN 3 TIMESDAILY  WITH MEALS Vicky Grange M, [place]  Active   pantoprazole  (PROTONIX ) 40 MG tablet [id number]  TAKE 1 TABLET BY MOUTH DAILY FOR 1 MONTH THEN 1 TABLET DAILY AS NEEDED FOR REFLUX Vicky Grange M, DO  Active   rosuvastatin  (CRESTOR ) 20 MG tablet [id number] No TAKE ONE (1) TABLET BY MOUTH EVERY DAY Vicky Grange M, DO Taking Active   Semaglutide , 2 MG/DOSE, 8 MG/3ML SOPN [id number] No Inject 2 mg as directed once a week. Vicky Grange M, DO Taking Active   SUMAtriptan  (IMITREX ) 100 MG tablet [id number] No MAY REPEAT IN 2 HOURS IF HEADACHE PERSISTS OR RECURS Vicky Grange M, DO Taking Active   triamcinolone  cream (KENALOG ) 0.1 % [id number] No Apply 1 application. topically 2 (two) times daily. X7-10 days for rash  Patient not taking: Reported on 11/18/2023   Eliodoro Guerin, DO Not Taking Active             Recommendation:   PCP Follow-up  Follow Up Plan:   Telephone follow up appointment date/time:  01-02-2024 at 9:00 am  Grandville Lax, BSN RN [facility], [facility] RN Care Manager Direct Dial: [phone]  Fax: [phone]

## 2023-12-11 ENCOUNTER — Other Ambulatory Visit (INDEPENDENT_AMBULATORY_CARE_PROVIDER_SITE_OTHER)

## 2023-12-11 DIAGNOSIS — Z794 Long term (current) use of insulin: Secondary | ICD-10-CM

## 2023-12-11 DIAGNOSIS — Z7985 Long-term (current) use of injectable non-insulin antidiabetic drugs: Secondary | ICD-10-CM

## 2023-12-11 DIAGNOSIS — E119 Type 2 diabetes mellitus without complications: Secondary | ICD-10-CM

## 2023-12-11 NOTE — Progress Notes (Signed)
 12/11/2023 Name: Melissa Schneider MRN: [id number] DOB: 1956-04-20  Chief Complaint  Patient presents with   Diabetes    Melissa Schneider is a 68 y.o. year old female who presented for a telephone visit.   They were referred to the pharmacist by their PCP for assistance in managing diabetes and medication access.    Subjective:  Patient reports she is tolerating Ozempic  2mg  well with no additional side effects.  She continues to use only small amounts of insulin  and is very diligent about using her freestyle libre 3 CGM.  Care Team: Primary Care Provider: Eliodoro Guerin, DO    Medication Access/Adherence  Current Pharmacy[facility]  [facility] [place], [place] - [address] [place] [address] Phone: [phone] Fax: [phone]  [facility] [place] (New Address) - [place], [place] - [address] AT Previously: [address], [place] [address] [facility] [address] 4[address] Floor [address] [place] [address] Phone: [phone] Fax: [phone]   Patient reports affordability concerns with their medications: Yes --enrolled in novo PAP Patient reports access/transportation concerns to their pharmacy: No  Patient reports adherence concerns with their medications:  No    Diabetes:  Current medications: Lantus , Novolog , Ozempic   Using FSL 3 CGM 5/23 Friday.  4:00 118  6:10 188 6 U fasting  10:15 221 8 U biscotti w/peanut butter  1:15 120 4 U pasta  1:45 84  2:50 104  5:00 132  6:30 164 8 U chicken salad cheese & tom bun  9:00 Ozempic  2 U no Lantus   11:00 92  11:45 64 yogurt   5/22 Thursday.  1:45 89 juice  6:05 67 juice  7:00 164 6 U fasting  7:45 209  9:15 154  12:00 190 burger/fries 10 U  6:00 140 6 U pizza slice  9:50 97 Lantus  6 U   5/21 Wednesday.  7:30 150 6 U fasting  9:00 112 breakfast tomato sandwich  9:40 131 6 U  11:30 88 2 cookies  1:30 188 6 U  3:00 74 late lunch pasta  4:30  138 6 U  6:10 86 shrimp with cocktail sauce  7:15 157 6 U  8:30 95 popcorn  11:00 180 Lantus  6 U bedtime  11:45 84 juice   5/20 Tuesday.  Sugar keeps going up, even after Novolog . Not feeling well. Headache.  Breakfast.2 eggs with cheese, tomato, avocado, and salsa  Lunch.salad  Supper.Parmesan garlic chicken breast with roasted pineapple, 1/2 baked potato  Sugar ran high all day 250-300 ??   Current physical activity: encouraged as able  Current medication access support: Novo Nordisk PAP   Objective:  Lab Results  Component Value Date   HGBA1C 7.7 (H) 08/30/2023    Lab Results  Component Value Date   CREATININE 0.81 09/19/2023   BUN 13 09/19/2023   NA 138 09/19/2023   K 4.0 09/19/2023   CL 104 09/19/2023   CO2 25 09/19/2023    Lab Results  Component Value Date   CHOL 168 01/07/2023   HDL 81 01/07/2023   LDLCALC 75 01/07/2023   LDLDIRECT 130 (H) 10/03/2022   TRIG 60 01/07/2023   CHOLHDL 2.1 01/07/2023    Medications Reviewed Today   Medications were not reviewed in this encounter     Assessment/Plan:   Diabetes: - Reviewed long term cardiovascular and renal outcomes of uncontrolled blood sugar - Reviewed goal A1c, goal fasting, and goal 2 hour post prandial  glucose - Reviewed dietary modifications including ensure 2-3 steady/healthy plate meals - Recommend to: - Recommend to : Continue Ozempic  2mg  weekly Continue Novolog  as needed--would only give if BG>200 PPBG Continue Lantus  4 units daily  - Patient denies personal or family history of multiple endocrine neoplasia type 2, medullary thyroid  cancer; personal history of pancreatitis or gallbladder disease. - Recommend to check glucose daily (fasting) or if symptomatic--has FSL 3 CGM - Patient denies personal or family history of multiple endocrine neoplasia type 2, medullary thyroid  cancer; personal history of pancreatitis or gallbladder disease.  Follow Up Plan: 1 month   Marvell Slider,  PharmD, BCACP, CPP Clinical Pharmacist, [facility]

## 2023-12-11 NOTE — Progress Notes (Signed)
   11/14/2023 Name: Melissa Schneider MRN: [id number] DOB: 11-24-1955  Diabetes  Melissa Schneider is a 68 y.o. year old female who presented for a telephone visit.   They were referred to the pharmacist by their PCP for assistance in managing diabetes and medication access.    Subjective: Patient is now stable on Ozempic  1mg  and reports using less insulin  overall.  She is down to Lnatus 4 units daily and using anywhere from 4-6 units of short acting meal time insulin  with each meal.  Her FBG was 98 this AM.  Care Team: Primary Care Provider: Eliodoro Guerin, DO    Medication Access/Adherence  Current Pharmacy:  [facility] - [place], [place] - [address] [place] [address] Phone: [phone] Fax: [phone]  [facility], [place] (New Address) - [place], [place] - [address] [facility] AT Previously: [address], [place] [address] [facility] [address] 4th Floor [address] [place] [address] Phone: [phone] Fax: [phone]   Patient reports affordability concerns with their medications: Yes  Patient reports access/transportation concerns to their pharmacy: No  Patient reports adherence concerns with their medications:  No     Diabetes:  Current medications: Ozempic , Lantus  , Novolog   Current glucose readings: freestyle libre 3 CGM Overall Bgs are <200 PPBG FBG<130 Working on eating steady meals  Current physical activity: encouraged as able   Current medication access support: novo PAP   Objective:  Lab Results  Component Value Date   HGBA1C 7.7 (H) 08/30/2023    Lab Results  Component Value Date   CREATININE 0.81 09/19/2023   BUN 13 09/19/2023   NA 138 09/19/2023   K 4.0 09/19/2023   CL 104 09/19/2023   CO2 25 09/19/2023    Lab Results  Component Value Date   CHOL 168 01/07/2023   HDL 81 01/07/2023   LDLCALC 75 01/07/2023   LDLDIRECT 130 (H) 10/03/2022   TRIG 60 01/07/2023    CHOLHDL 2.1 01/07/2023     Assessment/Plan:   Diabetes: - Currently controlled; adjusting regimen for addition of Ozempic  2mg  - Reviewed long term cardiovascular and renal outcomes of uncontrolled blood sugar - Reviewed goal A1c, goal fasting, and goal 2 hour post prandial glucose - Recommend to : Start Ozempic  2mg  weekly Back down/discontinue meal time--would only give if BG>200 PPBG Continue Lantus  4 units daily  - Patient denies personal or family history of multiple endocrine neoplasia type 2, medullary thyroid  cancer; personal history of pancreatitis or gallbladder disease. - Recommend to check glucose daily (fasting) or if symptomatic--has FSL 3 CGM   Follow Up Plan: 3 weeks  Marvell Slider, PharmD, BCACP, CPP Clinical Pharmacist, [facility]

## 2024-01-02 ENCOUNTER — Other Ambulatory Visit: Payer: Self-pay | Admitting: *Deleted

## 2024-01-02 NOTE — Patient Outreach (Addendum)
 Complex Care Management   Visit Note  01/02/2024  Name:  Melissa Schneider MRN: [id number] DOB: 1956-07-15  Situation: Referral received for Complex Care Management related to Diabetes with Complications and HTN I obtained verbal consent from Patient.  Visit completed with patient  on the phone  Background:   Past Medical History:  Diagnosis Date   Anemia 07/23/2011   Anxiety    Diabetes mellitus    Diabetic gastroparesis (HCC) 07/23/2011   Suspected   Headache(784.0)    Hearing difficulty    Hyperlipidemia    Hypothyroidism    Kidney stones    Microcytic anemia 07/23/2011   Thalassemia minor    ????? not really worked up per pt but daughter has it???    Assessment: Patient Reported Symptoms:  Cognitive Cognitive Status: Alert and oriented to person, place, and time, Able to follow simple commands, Insightful and able to interpret abstract concepts, Normal speech and language skills Cognitive/Intellectual Conditions Management [RPT]: None reported or documented in medical history or problem list   Health Maintenance Behaviors: Annual physical exam Healing Pattern: Average Health Facilitated by: Rest  Neurological Neurological Review of Symptoms: No symptoms reported    HEENT HEENT Symptoms Reported: No symptoms reported      Cardiovascular Cardiovascular Symptoms Reported: No symptoms reported Does patient have uncontrolled Hypertension?: No Cardiovascular Conditions: High blood cholesterol, Hypertension Cardiovascular Management Strategies: Medication therapy, Routine screening Weight: 178 lb (80.7 kg) (patient reported)  Respiratory Respiratory Symptoms Reported: No symptoms reported Respiratory Conditions: Seasonal allergies  Endocrine Patient reports the following symptoms related to hypoglycemia or hyperglycemia : No symptoms reported Is patient diabetic?: Yes Is patient checking blood sugars at home?: Yes Endocrine Conditions: Thyroid  disorder, Diabetes Endocrine  Management Strategies: Medication therapy, Routine screening  Gastrointestinal Gastrointestinal Symptoms Reported: No symptoms reported   Nutrition Risk Screen (CP): No indicators present  Genitourinary Genitourinary Symptoms Reported: No symptoms reported Genitourinary Conditions: Burning/pain Genitourinary Self-Management Outcome: 3 (uncertain) Genitourinary Comment: Patient reports approximately one week ago she had symptoms of UTI and now she is having right lower back pain and thinks she may have a kidney stone  Integumentary Integumentary Symptoms Reported: No symptoms reported    Musculoskeletal Musculoskelatal Symptoms Reviewed: No symptoms reported   Falls in the past year?: No Number of falls in past year: 1 or less Was there an injury with Fall?: No Fall Risk Category Calculator: 0 Patient Fall Risk Level: Low Fall Risk Patient at Risk for Falls Due to: No Fall Risks Fall risk Follow up: Falls evaluation completed  Psychosocial Psychosocial Symptoms Reported: No symptoms reported     Quality of Family Relationships: helpful, involved, supportive Do you feel physically threatened by others?: No      01/02/2024    9:13 AM  Depression screen PHQ 2/9  Decreased Interest 0  Down, Depressed, Hopeless 0  PHQ - 2 Score 0    Vitals:   01/02/24 0921  BP: 125/66  Pulse: 79    Medications Reviewed Today     Reviewed by Remona Carmel, RN (Registered Nurse) on 01/02/24 at 0920  Med List Status: <None>   Medication Order Taking? Sig Documenting Provider Last Dose Status Informant  Aloe Vera 72 % CREA [id number]  Apply 1 Application topically 2 (two) times daily as needed.  Patient not taking: Reported on 01/02/2024   Anton Baton, NP  Consider Medication Status and Discontinue   aspirin 81 MG tablet [id number] Yes Take 81 mg by mouth daily. [provider]  Active   buPROPion  (WELLBUTRIN  SR) 150 MG 12 hr tablet [id number] Yes Take 1 tablet (150 mg  total) by mouth in the morning. Vicky Grange M, DO  Active   Capsaicin-Menthol-Methyl Sal (CAPSAICIN-METHYL SAL-MENTHOL) 0.025-1-12 % CREA [id number]  Apply 1 Application topically 2 (two) times daily as needed.  Patient not taking: Reported on 01/02/2024   Anton Baton, NP  Consider Medication Status and Discontinue   Continuous Glucose Sensor (FREESTYLE LIBRE 3 SENSOR) [place] [id number] Yes CHANGE EVERY 14 DAYS Vicky Grange M, DO  Active   cyclobenzaprine  (FLEXERIL ) 10 MG tablet [id number]  Take 1 tablet (10 mg total) by mouth 3 (three) times daily as needed for muscle spasms.  Patient not taking: Reported on 01/02/2024   Dettinger, Lucio Sabin, MD  Active   docusate sodium  (COLACE) 100 MG capsule [id number] Yes Take 200 mg by mouth daily as needed.  [provider]  Active   glucose blood (GNP EASY TOUCH GLUCOSE TEST) test strip [id number] Yes EVERY DAY Vicky Grange M, DO  Active   insulin  glargine (LANTUS ) 100 UNIT/ML injection [id number] Yes INJECT 0.22-0.3ML (22-30 UNITS) INTO THESKIN AT BEDTIME  Patient taking differently: 6 Units at bedtime. INJECT 0.22-0.3ML (22-30 UNITS) INTO THESKIN AT BEDTIME   Vicky Grange M, DO  Active   Insulin  Pen Needle (PEN NEEDLES) 32G X 4 MM MISC [id number] Yes UAD E11.69 Vicky Grange M, DO  Active   Insulin  Syringe-Needle U-100 (INSULIN  SYRINGE .5CC/31GX5/16) 31G X 5/16 0.5 ML MISC [id number] Yes UAD with Lantus  E11.69 Vicky Grange M, DO  Active   levocetirizine (XYZAL ) 5 MG tablet [id number]  Take 1 tablet (5 mg total) by mouth at bedtime as needed for allergies (itching/ rash).  Patient not taking: Reported on 01/02/2024   Eliodoro Guerin, DO  Active   levothyroxine  (SYNTHROID ) 137 MCG tablet [id number] Yes TAKE ONE TABLET [facility] BEFORE BREAKFAST Vicky Grange M, [place]  Active   losartan  (COZAAR ) 25 MG tablet [id number] Yes TAKE ONE (1) TABLET EACH DAY Gottschalk, Ashly M, [place]  Active   Multiple Vitamin  (MULTIVITAMIN WITH MINERALS) TABS tablet [id number]  Take 1 tablet by mouth daily.  Patient not taking: Reported on 01/02/2024   [provider]  Consider Medication Status and Discontinue Self  nitroGLYCERIN  (NITROSTAT ) 0.4 MG SL tablet [id number]  Place 1 tablet (0.4 mg total) under the tongue every 5 (five) minutes as needed for chest pain.  Patient not taking: Reported on 01/02/2024   Lasalle Pointer, NP  Expired 01/01/24 2359   NOVOLOG  FLEXPEN 100 UNIT/ML FlexPen [id number] Yes INJECT 10-17 UNITS INTO THE SKIN 3 TIMESDAILY WITH MEALS Vicky Grange M, DO  Active   pantoprazole  (PROTONIX ) 40 MG tablet [id number] Yes TAKE 1 TABLET BY MOUTH DAILY FOR 1 MONTH THEN 1 TABLET DAILY AS NEEDED FOR REFLUX Vicky Grange M, DO  Active   rosuvastatin  (CRESTOR ) 20 MG tablet [id number] Yes TAKE ONE (1) TABLET BY MOUTH EVERY DAY Vicky Grange M, DO  Active   Semaglutide , 2 MG/DOSE, 8 MG/3ML SOPN [id number] Yes Inject 2 mg as directed once a week. Vicky Grange M, DO  Active   SUMAtriptan  (IMITREX ) 100 MG tablet [id number] Yes MAY REPEAT IN 2 HOURS IF HEADACHE PERSISTS OR RECURS Vicky Grange M, DO  Active   triamcinolone  cream (KENALOG ) 0.1 % [id number]  Apply 1 application. topically 2 (two) times daily. X7-10 days for rash  Patient not taking: Reported on 01/02/2024   Eliodoro Guerin, DO  Consider  Medication Status and Discontinue             Recommendation:   Acute PCP follow-up possible kidney stone/UTI symptoms reported.  Follow Up Plan:   Telephone follow-up in 1 month  Grandville Lax, BSN RN [facility], [facility] RN Care Manager Direct Dial: [phone]  Fax: [phone]

## 2024-01-02 NOTE — Patient Instructions (Signed)
 Visit Information  Thank you for taking time to visit with me today. Please don't hesitate to contact me if I can be of assistance to you before our next scheduled appointment.  Your next care management appointment is by telephone on 01-30-2024 at 9:00 am  Telephone follow-up in 1 month  Please call the care guide team at [phone] if you need to cancel, schedule, or reschedule an appointment.   Please call the Suicide and Crisis Lifeline: 988 call the [place]  National Suicide Prevention Lifeline: 802 179 5915 or TTY: 450-136-1061 TTY 548-869-0061) to talk to a trained counselor call 1-800-273-TALK (toll free, 24 hour hotline) call the [facility]: [phone] call 911 if you are experiencing a Mental Health or Behavioral Health Crisis or need someone to talk to.  Grandville Lax, BSN RN [facility], [facility] RN Care Manager Direct Dial: [phone]  Fax: [phone]

## 2024-01-03 ENCOUNTER — Other Ambulatory Visit: Payer: Self-pay | Admitting: Family Medicine

## 2024-01-03 ENCOUNTER — Telehealth: Payer: Self-pay

## 2024-01-03 NOTE — Telephone Encounter (Signed)
-----   Message from Nurse Lanney Pitts sent at 01/03/2024  8:49 AM EDT ----- Regarding: Acute appointment Good Morning!  Can someone assist with getting this patient scheduled for an acute visit? ----- Message ----- From: Eliodoro Guerin, DO Sent: 01/03/2024   7:02 AM EDT To: Remona Carmel, RN  Sounds like someone that should be seen. ----- Message ----- From: Remona Carmel, RN Sent: 01/02/2024   9:35 AM EDT To: Eliodoro Guerin, DO; [facility]  Good morning. Patient is reports that last week she was having some symptoms related to a UTI and she took a few antibiotics she found at home left over and stated that this week she feels like she may be developing a kidney stone due to some lower back pain. She denies any fever, chills at this time. She states she felt like this in the past when she had to have lithotripsy . Please advise. Thanks! Grandville Lax, BSN RN [facility], [facility] RN Care Manager Direct Dial: [phone]  Fax:[phone]

## 2024-01-03 NOTE — Telephone Encounter (Signed)
 Patient is hard of hearing. Sent her a Clinical cytogeneticist message about this.

## 2024-01-08 ENCOUNTER — Ambulatory Visit: Payer: Self-pay | Admitting: Nurse Practitioner

## 2024-01-08 ENCOUNTER — Encounter: Payer: Self-pay | Admitting: Nurse Practitioner

## 2024-01-08 ENCOUNTER — Ambulatory Visit: Admitting: Nurse Practitioner

## 2024-01-08 VITALS — BP 126/73 | HR 85 | Temp 98.0°F | Ht 62.0 in | Wt 185.0 lb

## 2024-01-08 DIAGNOSIS — N200 Calculus of kidney: Secondary | ICD-10-CM

## 2024-01-08 DIAGNOSIS — R399 Unspecified symptoms and signs involving the genitourinary system: Secondary | ICD-10-CM | POA: Diagnosis not present

## 2024-01-08 DIAGNOSIS — M549 Dorsalgia, unspecified: Secondary | ICD-10-CM | POA: Diagnosis not present

## 2024-01-08 LAB — URINALYSIS, COMPLETE
Bilirubin, UA: NEGATIVE
Glucose, UA: NEGATIVE
Leukocytes,UA: NEGATIVE
Nitrite, UA: NEGATIVE
Protein,UA: NEGATIVE
RBC, UA: NEGATIVE
Specific Gravity, UA: >1.03 — ABNORMAL HIGH (ref 1.005–1.030)
Urobilinogen, Ur: 0.2 mg/dL (ref 0.2–1.0)
pH, UA: 5.5 (ref 5.0–7.5)

## 2024-01-08 LAB — MICROSCOPIC EXAMINATION
Bacteria, UA: NONE SEEN
RBC, Urine: NONE SEEN /HPF (ref 0–2)
Renal Epithel, UA: NONE SEEN /HPF
WBC, UA: NONE SEEN /HPF (ref 0–5)
Yeast, UA: NONE SEEN

## 2024-01-08 MED ORDER — KETOROLAC TROMETHAMINE 30 MG/ML IJ SOLN
30.0000 mg | Freq: Once | INTRAMUSCULAR | Status: AC
  Administered 2024-01-08: 15 mg via INTRAMUSCULAR

## 2024-01-08 MED ORDER — TAMSULOSIN HCL 0.4 MG PO CAPS: 0.4000 mg | ORAL_CAPSULE | Freq: Every day | ORAL | 0 refills | Status: DC

## 2024-01-08 MED ORDER — KETOROLAC TROMETHAMINE 15 MG/ML IJ SOLN: 15.0000 mg | Freq: Once | INTRAMUSCULAR | 0 refills | Status: DC | PRN

## 2024-01-08 NOTE — Telephone Encounter (Signed)
 Pt is coming today to see Nena this afternoon.

## 2024-01-08 NOTE — Progress Notes (Unsigned)
 Acute Office Visit  Subjective:     Patient ID: Melissa Schneider, female    DOB: February 01, 1956, 68 y.o.   MRN: [id number]  Chief Complaint  Patient presents with   Urinary Tract Infection    HPI Melissa Schneider is a 68 year old female who presents with complaints of urinary frequency, urgency, and dysuria for the past 6 days. She reports partial symptom relief after self-medicating with leftover antibiotics prescribed to her husband, stating, I took a few of my husband's leftover antibiotics and the dysuria went away. However, she now reports new onset of right flank pain and associated nausea. She denies fever, chills, hematuria, or abnormal vaginal discharge or bleeding.  She has a history of kidney stones in 2016, for which she required surgical intervention. She describes the current pain as less severe than during her previous episode, stating, "It is not as painful as it was back then." The patient inquired about the expected timing of passing the suspected stone.  Active Ambulatory Problems    Diagnosis Date Noted   Hypothyroidism 08/24/2010   DM (diabetes mellitus), type 2, uncontrolled 08/24/2010   Diabetic gastroparesis (HCC) 07/23/2011   Obstructive uropathy 08/13/2013   Stone in kidney 08/13/2013   Recurrent obstructive pyelonephritis 08/14/2013   Constipation 04/05/2014   Microcytic anemia 04/05/2014   Migraine 03/17/2015   Diabetic neuropathy (HCC) 03/17/2015   BMI 35.0-35.9,adult 08/11/2015   Hypertension associated with diabetes (HCC) 11/07/2015   Hearing problem of both ears 01/30/2016   Low back pain 04/26/2016   Hyperlipidemia associated with type 2 diabetes mellitus (HCC) 09/01/2018   Depressed mood 09/01/2018   Diabetes mellitus (HCC) 10/03/2022   Tendinitis of ankle 02/06/2023   Disseminated herpes zoster 05/13/2023   Postherpetic neuralgia 05/28/2023   Kidney stones, calcium  oxalate 01/08/2024   Acute back pain 01/08/2024   UTI symptoms 01/08/2024    Resolved Ambulatory Problems    Diagnosis Date Noted   HYPERCHOLESTEROLEMIA 08/24/2010   Migraine headache 08/24/2010   Hearing loss 08/24/2010   DKA, type 2 (HCC) 07/20/2011   Leukocytosis 07/20/2011   Hypokalemia 07/21/2011   Constipation 07/22/2011   Hyponatremia 07/23/2011   Anemia 07/23/2011   Microcytic anemia 07/23/2011   DKA (diabetic ketoacidosis) (HCC) 08/13/2013   UTI (urinary tract infection) 08/13/2013   Leukocytosis 08/13/2013   Acute renal failure (HCC) 08/13/2013   DKA (diabetic ketoacidosis) (HCC) 08/16/2013   Intractable nausea and vomiting 08/22/2013   Hypokalemia 08/22/2013   Dehydration 08/22/2013   Generalized weakness 08/22/2013   DKA, type 2 (HCC) 08/22/2013   Flu-like symptoms 04/29/2015   Hyperlipidemia 06/16/2015   Chest pain 08/11/2015   Thyroid  activity decreased 11/07/2015   Bilateral impacted cerumen 01/22/2020   Past Medical History:  Diagnosis Date   Anxiety    Diabetes mellitus    Headache(784.0)    Hearing difficulty    Kidney stones    Thalassemia minor    . Review of Systems  Constitutional:  Negative for chills and fever.  Respiratory:  Negative for cough and sputum production.   Genitourinary:  Positive for frequency and urgency.  Musculoskeletal:        Right flank pain  Skin:  Negative for rash.  Neurological:  Negative for dizziness and headaches.   Negative unless indicated in HPI    Objective:    BP 126/73   Pulse 85   Temp 98 F (36.7 C)   Ht 5' 2 (1.575 m)   Wt 185 lb (83.9 kg)   SpO2  97%   BMI 33.84 kg/m  BP Readings from Last 3 Encounters:  01/08/24 126/73  01/02/24 125/66  11/18/23 112/70   Wt Readings from Last 3 Encounters:  01/08/24 185 lb (83.9 kg)  01/02/24 178 lb (80.7 kg)  11/18/23 188 lb 12.8 oz (85.6 kg)      Physical Exam Vitals and nursing note reviewed.  Constitutional:      General: She is not in acute distress. HENT:     Head: Normocephalic and atraumatic.     Nose: Nose  normal.     Mouth/Throat:     Mouth: Mucous membranes are moist.   Eyes:     General: No scleral icterus.    Extraocular Movements: Extraocular movements intact.     Conjunctiva/sclera: Conjunctivae normal.     Pupils: Pupils are equal, round, and reactive to light.    Cardiovascular:     Heart sounds: Normal heart sounds.  Pulmonary:     Effort: Pulmonary effort is normal.     Breath sounds: Normal breath sounds.  Abdominal:     Palpations: Abdomen is soft.     Tenderness: There is right CVA tenderness.   Musculoskeletal:        General: Normal range of motion.     Right lower leg: No edema.     Left lower leg: No edema.   Skin:    General: Skin is warm and dry.     Findings: No rash.   Neurological:     Mental Status: She is alert and oriented to person, place, and time.   Psychiatric:        Mood and Affect: Mood normal.        Behavior: Behavior normal.        Thought Content: Thought content normal.        Judgment: Judgment normal.    Urine dipstick shows positive for ketones, Ket 1 and bill 1+.  Micro exam: epitelial cell crystals seen, Ca oxalate  casts seen.   No results found for any visits on 01/08/24.      Assessment & Plan:  UTI symptoms -     Urinalysis, Complete -     Urine Culture  Kidney stones, calcium  oxalate -     Tamsulosin HCl; Take 1 capsule (0.4 mg total) by mouth daily.  Dispense: 30 capsule; Refill: 0  Acute back pain, unspecified back location, unspecified back pain laterality -     Urinalysis, Complete -     Urine Culture -     Tamsulosin HCl; Take 1 capsule (0.4 mg total) by mouth daily.  Dispense: 30 capsule; Refill: 0  Other orders -     Ketorolac  Tromethamine ; Inject 1 mL (15 mg total) into the vein once as needed for up to 1 dose.  Dispense: 1 mL; Refill: 0    [name] is a 68 year old Caucasian female seen today for right flank pain, no acute distress Possible kidney stones, IM Toradol  administered, increase hydration;  tamulosin 0.4 mg daily Call or return to clinic prn if these symptoms worsen or fail to improve as anticipated.   Future: possible CT The above assessment and management plan was discussed with the patient. The patient verbalized understanding of and has agreed to the management plan. Patient is aware to call the clinic if they develop any new symptoms or if symptoms persist or worsen. Patient is aware when to return to the clinic for a follow-up visit. Patient educated on when it is appropriate to  go to the emergency department.  Return for follow-up.  Jayme Mednick [place], DNP [facility] [address] [place], [place] [address] [phone]  Note: This document was prepared by [employer] voice dictation technology and any errors that results from this process are unintentional.

## 2024-01-09 ENCOUNTER — Telehealth: Payer: Self-pay

## 2024-01-10 LAB — URINE CULTURE

## 2024-01-27 ENCOUNTER — Encounter: Payer: Self-pay | Admitting: Family Medicine

## 2024-01-27 ENCOUNTER — Ambulatory Visit (INDEPENDENT_AMBULATORY_CARE_PROVIDER_SITE_OTHER): Admitting: Family Medicine

## 2024-01-27 ENCOUNTER — Ambulatory Visit: Payer: Self-pay | Admitting: Family Medicine

## 2024-01-27 ENCOUNTER — Ambulatory Visit (HOSPITAL_COMMUNITY)
Admission: RE | Admit: 2024-01-27 | Discharge: 2024-01-27 | Disposition: A | Discharge status: Home / Self Care | Source: Ambulatory Visit | Attending: Family Medicine | Admitting: Family Medicine

## 2024-01-27 VITALS — BP 133/71 | HR 73 | Temp 97.8°F | Ht 62.0 in | Wt 182.0 lb

## 2024-01-27 DIAGNOSIS — E039 Hypothyroidism, unspecified: Secondary | ICD-10-CM | POA: Diagnosis not present

## 2024-01-27 DIAGNOSIS — E1169 Type 2 diabetes mellitus with other specified complication: Secondary | ICD-10-CM | POA: Diagnosis not present

## 2024-01-27 DIAGNOSIS — Z87442 Personal history of urinary calculi: Secondary | ICD-10-CM

## 2024-01-27 DIAGNOSIS — E785 Hyperlipidemia, unspecified: Secondary | ICD-10-CM | POA: Diagnosis not present

## 2024-01-27 DIAGNOSIS — E1142 Type 2 diabetes mellitus with diabetic polyneuropathy: Secondary | ICD-10-CM | POA: Diagnosis not present

## 2024-01-27 DIAGNOSIS — E119 Type 2 diabetes mellitus without complications: Secondary | ICD-10-CM

## 2024-01-27 DIAGNOSIS — Z794 Long term (current) use of insulin: Secondary | ICD-10-CM | POA: Diagnosis not present

## 2024-01-27 DIAGNOSIS — D649 Anemia, unspecified: Secondary | ICD-10-CM | POA: Diagnosis not present

## 2024-01-27 DIAGNOSIS — R109 Unspecified abdominal pain: Secondary | ICD-10-CM | POA: Insufficient documentation

## 2024-01-27 DIAGNOSIS — N200 Calculus of kidney: Secondary | ICD-10-CM

## 2024-01-27 LAB — URINALYSIS, ROUTINE W REFLEX MICROSCOPIC
Bilirubin, UA: NEGATIVE
Glucose, UA: NEGATIVE
Leukocytes,UA: NEGATIVE
Nitrite, UA: NEGATIVE
Protein,UA: NEGATIVE
RBC, UA: NEGATIVE
Specific Gravity, UA: 1.025 (ref 1.005–1.030)
Urobilinogen, Ur: 0.2 mg/dL (ref 0.2–1.0)
pH, UA: 5.5 (ref 5.0–7.5)

## 2024-01-27 LAB — BAYER DCA HB A1C WAIVED: HB A1C (BAYER DCA - WAIVED): 7.2 % — ABNORMAL HIGH (ref 4.8–5.6)

## 2024-01-27 LAB — MICROSCOPIC EXAMINATION
Bacteria, UA: NONE SEEN
Epithelial Cells (non renal): NONE SEEN /HPF (ref 0–10)
RBC, Urine: NONE SEEN /HPF (ref 0–2)
Renal Epithel, UA: NONE SEEN /HPF
WBC, UA: NONE SEEN /HPF (ref 0–5)
Yeast, UA: NONE SEEN

## 2024-01-27 LAB — LIPID PANEL

## 2024-01-27 MED ORDER — FREESTYLE LIBRE 3 PLUS SENSOR MISC: 4 refills | Status: DC

## 2024-01-27 MED ORDER — ONDANSETRON 4 MG PO TBDP: 4.0000 mg | ORAL_TABLET | Freq: Three times a day (TID) | ORAL | 0 refills | Status: AC | PRN

## 2024-01-27 NOTE — Progress Notes (Signed)
 Subjective: CC:DM PCP: Melissa Schneider HERO, DO [name] is a 68 y.o. female presenting to clinic today for:  1. Type 2 Diabetes with hyperlipidemia w/ neuropathy/ renal stone:  Glucometer: Freestyle libre 3.  Needs to be transition to 3+ today.  No reports of any major hypoglycemic episodes but does try to keep something sugary on her just in case.  GLP increased to 2mg  last OV. Continues insulin  as directed.  She reports some nausea but thinks this is related to a renal stone that she was seen for about a month ago.  She has not passed the stone but reports that the right sided flank pain has started traversing the right lower pelvis.  She has a history of renal stone on this side before the required stenting.  She reports no hematuria, dysuria, vomiting or fevers.  She reports that her urine does look somewhat foamy however.  She was given Flomax  but notes that she started becoming hypotensive with it and when she contacted the office for instruction she did not receive a call back from the examining provider  Diabetes Health Maintenance Due  Topic Date Due   FOOT EXAM  10/03/2023   HEMOGLOBIN A1C  02/27/2024   OPHTHALMOLOGY EXAM  05/19/2024    Last A1c:  Lab Results  Component Value Date   HGBA1C 7.7 (H) 08/30/2023    ROS: No chest pain, shortness of breath or falls reported.   ROS: Per HPI  Allergies  Allergen Reactions   Codeine Nausea Only and Other (See Comments)    NAUSEA AND CAUSES HER TO HAVE BAD DREAMS   Gabapentin      Confusion    Lisinopril     Coughing   Past Medical History:  Diagnosis Date   Anemia 07/23/2011   Anxiety    Diabetes mellitus    Diabetic gastroparesis (HCC) 07/23/2011   Suspected   Headache(784.0)    Hearing difficulty    Hyperlipidemia    Hypothyroidism    Kidney stones    Microcytic anemia 07/23/2011   Recurrent obstructive pyelonephritis 08/14/2013   Thalassemia minor    ????? not really worked up per pt but daughter  has it???    Current Outpatient Medications:    Aloe Vera 72 % CREA, Apply 1 Application topically 2 (two) times daily as needed. (Patient not taking: Reported on 01/02/2024), Disp: 114 g, Rfl: 0   aspirin 81 MG tablet, Take 81 mg by mouth daily., Disp: , Rfl:    buPROPion  (WELLBUTRIN  SR) 150 MG 12 hr tablet, Take 1 tablet (150 mg total) by mouth in the morning., Disp: 90 tablet, Rfl: 3   Capsaicin-Menthol-Methyl Sal (CAPSAICIN-METHYL SAL-MENTHOL) 0.025-1-12 % CREA, Apply 1 Application topically 2 (two) times daily as needed. (Patient not taking: Reported on 01/02/2024), Disp: 56.6 g, Rfl: 0   Continuous Glucose Sensor (FREESTYLE LIBRE 3 SENSOR) MISC, CHANGE EVERY 14 DAYS, Disp: 6 each, Rfl: 3   cyclobenzaprine  (FLEXERIL ) 10 MG tablet, Take 1 tablet (10 mg total) by mouth 3 (three) times daily as needed for muscle spasms. (Patient not taking: Reported on 01/02/2024), Disp: 30 tablet, Rfl: 0   docusate sodium  (COLACE) 100 MG capsule, Take 200 mg by mouth daily as needed. , Disp: , Rfl:    glucose blood (GNP EASY TOUCH GLUCOSE TEST) test strip, EVERY DAY, Disp: 100 each, Rfl: 11   insulin  glargine (LANTUS ) 100 UNIT/ML injection, INJECT 0.22-0.3ML (22-30 UNITS) INTO THESKIN AT BEDTIME (Patient taking differently: 6 Units at bedtime. INJECT  0.22-0.3ML (22-30 UNITS) INTO THESKIN AT BEDTIME), Disp: 30 mL, Rfl: 0   Insulin  Pen Needle (PEN NEEDLES) 32G X 4 MM MISC, UAD E11.69, Disp: 100 each, Rfl: 3   Insulin  Syringe-Needle U-100 (INSULIN  SYRINGE .5CC/31GX5/16) 31G X 5/16 0.5 ML MISC, UAD with Lantus  E11.69, Disp: 100 each, Rfl: 3   ketorolac  (TORADOL ) 15 MG/ML injection, Inject 1 mL (15 mg total) into the vein once as needed for up to 1 dose., Disp: 1 mL, Rfl: 0   levocetirizine (XYZAL ) 5 MG tablet, Take 1 tablet (5 mg total) by mouth at bedtime as needed for allergies (itching/ rash). (Patient not taking: Reported on 01/02/2024), Disp: 90 tablet, Rfl: 0   levothyroxine  (SYNTHROID ) 137 MCG tablet, TAKE ONE  TABLET EACH MORNING BEFORE BREAKFAST, Disp: 90 tablet, Rfl: 2   losartan  (COZAAR ) 25 MG tablet, TAKE ONE (1) TABLET EACH DAY, Disp: 90 tablet, Rfl: 3   Multiple Vitamin (MULTIVITAMIN WITH MINERALS) TABS tablet, Take 1 tablet by mouth daily. (Patient not taking: Reported on 01/02/2024), Disp: , Rfl:    nitroGLYCERIN  (NITROSTAT ) 0.4 MG SL tablet, Place 1 tablet (0.4 mg total) under the tongue every 5 (five) minutes as needed for chest pain. (Patient not taking: Reported on 01/02/2024), Disp: 25 tablet, Rfl: 3   NOVOLOG  FLEXPEN 100 UNIT/ML FlexPen, INJECT 10-17 UNITS INTO THE SKIN 3 TIMESDAILY WITH MEALS, Disp: 45 mL, Rfl: 0   pantoprazole  (PROTONIX ) 40 MG tablet, TAKE 1 TABLET BY MOUTH DAILY FOR 1 MONTH THEN 1 TABLET DAILY AS NEEDED FOR REFLUX, Disp: 90 tablet, Rfl: 0   rosuvastatin  (CRESTOR ) 20 MG tablet, TAKE ONE (1) TABLET BY MOUTH EVERY DAY, Disp: 90 tablet, Rfl: 0   Semaglutide , 2 MG/DOSE, 8 MG/3ML SOPN, Inject 2 mg as directed once a week., Disp: , Rfl:    SUMAtriptan  (IMITREX ) 100 MG tablet, MAY REPEAT IN 2 HOURS IF HEADACHE PERSISTS OR RECURS, Disp: 9 tablet, Rfl: 1   tamsulosin  (FLOMAX ) 0.4 MG CAPS capsule, Take 1 capsule (0.4 mg total) by mouth daily., Disp: 30 capsule, Rfl: 0   triamcinolone  cream (KENALOG ) 0.1 %, Apply 1 application. topically 2 (two) times daily. 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   Occupation: Retired  Tobacco Use   Smoking status: Never   Smokeless tobacco: Never   Tobacco comments:    Never smoked  Vaping Use   Vaping status: Never Used  Substance and Sexual Activity   Alcohol use: No    Alcohol/week: 0.0 standard drinks of  alcohol   Drug use: No   Sexual activity: Not Currently  Other Topics Concern   Not on file  Social History Narrative   Lives home with husband - Daughter lives in [place] - she has a great grandchild also   Social Drivers of Corporate investment banker Strain: Low Risk  (07/18/2023)   Overall Financial Resource Strain (CARDIA)    Difficulty of Paying Living Expenses: Not hard at all  Food Insecurity: No Food Insecurity (01/02/2024)   Hunger Vital Sign    Worried About Running Out of Food in the Last Year: Never true    Ran Out  of Food in the Last Year: Never true  Transportation Needs: No Transportation Needs (01/02/2024)   PRAPARE - Administrator, Civil Service (Medical): No    Lack of Transportation (Non-Medical): No  Physical Activity: Sufficiently Active (07/18/2023)   Exercise Vital Sign    Days of Exercise per Week: 7 days    Minutes of Exercise per Session: 60 min  Stress: No Stress Concern Present (02/26/2023)   [employer] of Occupational Health - Occupational Stress Questionnaire    Feeling of Stress : Not at all  Social Connections: Socially Integrated (07/18/2023)   Social Connection and Isolation Panel    Frequency of Communication with Friends and Family: More than three times a week    Frequency of Social Gatherings with Friends and Family: More than three times a week    Attends Religious Services: More than 4 times per year    Active Member of [employer] or Organizations: Yes    Attends Engineer, structural: More than 4 times per year    Marital Status: Married  Catering manager Violence: Not At Risk (01/02/2024)   Humiliation, Afraid, Rape, and Kick questionnaire    Fear of Current or Ex-Partner: No    Emotionally Abused: No    Physically Abused: No    Sexually Abused: No   Family History  Problem Relation Age of Onset   Colon polyps Mother    Rheum arthritis Sister    Breast cancer Maternal Aunt    Breast cancer Maternal Aunt    Colon cancer  Maternal Grandmother        age 53   CAD Brother     Objective: Office vital signs reviewed. BP 133/71   Pulse 73   Temp 97.8 F (36.6 C)   Ht 5' 2 (1.575 m)   Wt 182 lb (82.6 kg)   SpO2 98%   BMI 33.29 kg/m   Physical Examination:  General: Awake, alert, well nourished, No acute distress HEENT: sclera white, MMM Cardio: regular rate and rhythm, S1S2 heard, no murmurs appreciated Pulm: clear to auscultation bilaterally, no wheezes, rhonchi or rales; normal work of breathing on room air GU: No suprapubic tenderness palpation.  No CVA tenderness to palpation.  She does have some mild tenderness along the right middle lower abdomen   Assessment/ Plan: 68 y.o. female   Insulin -requiring or dependent type II diabetes mellitus (HCC) - Plan: Bayer DCA Hb A1c Waived, Continuous Glucose Sensor (FREESTYLE LIBRE 3 PLUS SENSOR) MISC  Diabetic polyneuropathy associated with type 2 diabetes mellitus (HCC)  Hyperlipidemia associated with type 2 diabetes mellitus (HCC) - Plan: Lipid Panel  Acquired hypothyroidism - Plan: TSH + free T4  Anemia, unspecified type - Plan: CBC  Right flank pain - Plan: CT RENAL STONE STUDY, Basic Metabolic Panel, Urinalysis, Routine w reflex microscopic, ondansetron  (ZOFRAN -ODT) 4 MG disintegrating tablet  History of renal stone - Plan: CT RENAL STONE STUDY, Basic Metabolic Panel, Urinalysis, Routine w reflex microscopic, ondansetron  (ZOFRAN -ODT) 4 MG disintegrating tablet   I have ordered her freestyle libre 3+ and given her sample today.  Check A1c, renal function  Neuropathy is chronic and stable.  Lipid panel collected.  Check thyroid  levels.  Continue all medications as prescribed  Check CBC given history of anemia and now renal stone.  Recheck urinalysis to look for any evidence of secondary infection.  Check BMP given reports of foamy urine.  Zofran  sent for as needed use.  Offered tramadol but she declined this.  CT renal stone study stat ordered.   Further interventions pending this result.  Anticipate need for referral to urology in [facility] CHRISTELLA Fielding, DO [facility] [phone]

## 2024-01-28 LAB — LIPID PANEL
Cholesterol, Total: 225 mg/dL — AB (ref 100–199)
HDL: 75 mg/dL (ref >39)
LDL CALC COMMENT:: 3 ratio (ref 0.0–4.4)
LDL Chol Calc (NIH): 132 mg/dL — AB (ref 0–99)
Triglycerides: 102 mg/dL (ref 0–149)
VLDL Cholesterol Cal: 18 mg/dL (ref 5–40)

## 2024-01-28 LAB — BASIC METABOLIC PANEL WITH GFR
BUN/Creatinine Ratio: 16 (ref 12–28)
BUN: 13 mg/dL (ref 8–27)
CO2: 21 mmol/L (ref 20–29)
Calcium: 9.4 mg/dL (ref 8.7–10.3)
Chloride: 103 mmol/L (ref 96–106)
Creatinine, Ser: 0.79 mg/dL (ref 0.57–1.00)
Glucose: 106 mg/dL — AB (ref 70–99)
Potassium: 4.2 mmol/L (ref 3.5–5.2)
Sodium: 139 mmol/L (ref 134–144)
eGFR: 82 mL/min/1.73 (ref >59)

## 2024-01-28 LAB — CBC
Hematocrit: 43.3 % (ref 34.0–46.6)
Hemoglobin: 12.5 g/dL (ref 11.1–15.9)
MCH: 19.9 pg — ABNORMAL LOW (ref 26.6–33.0)
MCHC: 28.9 g/dL — ABNORMAL LOW (ref 31.5–35.7)
MCV: 69 fL — ABNORMAL LOW (ref 79–97)
Platelets: 285 x10E3/uL (ref 150–450)
RBC: 6.28 x10E6/uL — ABNORMAL HIGH (ref 3.77–5.28)
RDW: 18.4 % — ABNORMAL HIGH (ref 11.7–15.4)
WBC: 5.1 x10E3/uL (ref 3.4–10.8)

## 2024-01-28 LAB — TSH+FREE T4
Free T4: 0.99 ng/dL (ref 0.82–1.77)
TSH: 5.31 u[IU]/mL — AB (ref 0.450–4.500)

## 2024-01-30 ENCOUNTER — Other Ambulatory Visit: Payer: Self-pay | Admitting: *Deleted

## 2024-01-30 NOTE — Patient Instructions (Signed)
 Visit Information  Thank you for taking time to visit with me today. Please don't hesitate to contact me if I can be of assistance to you before our next scheduled appointment.  Your next care management appointment is by telephone on 03-02-2024 at 9:00 am  Telephone follow-up in 1 month  Please call the care guide team at [phone] if you need to cancel, schedule, or reschedule an appointment.   Please call the Suicide and Crisis Lifeline: 988 call the [place]  National Suicide Prevention Lifeline: 940-673-2599 or TTY: (971)359-9642 TTY 651-243-5136) to talk to a trained counselor call 1-800-273-TALK (toll free, 24 hour hotline) call the [facility]: [phone] call 911 if you are experiencing a Mental Health or Behavioral Health Crisis or need someone to talk to.  Rosina Forte, BSN RN [facility], [facility] RN Care Manager Direct Dial: [phone]  Fax: [phone]

## 2024-01-30 NOTE — Patient Outreach (Signed)
 Complex Care Management   Visit Note  01/30/2024  Name:  Melissa Schneider MRN: [id number] DOB: 1956/06/19  Situation: Referral received for Complex Care Management related to Diabetes with Complications I obtained verbal consent from Patient.  Visit completed with patient  on the phone  Background:   Past Medical History:  Diagnosis Date   Anemia 07/23/2011   Anxiety    Diabetes mellitus    Diabetic gastroparesis (HCC) 07/23/2011   Suspected   Headache(784.0)    Hearing difficulty    Hyperlipidemia    Hypothyroidism    Kidney stones    Microcytic anemia 07/23/2011   Recurrent obstructive pyelonephritis 08/14/2013   Thalassemia minor    ????? not really worked up per pt but daughter has it???    Assessment: Patient Reported Symptoms:  Cognitive Cognitive Status: No symptoms reported Cognitive/Intellectual Conditions Management [RPT]: None reported or documented in medical history or problem list   Health Maintenance Behaviors: Annual physical exam Healing Pattern: Average Health Facilitated by: Rest, Pain control  Neurological Neurological Review of Symptoms: No symptoms reported Neurological Management Strategies: Routine screening Neurological Self-Management Outcome: 4 (good)  HEENT HEENT Symptoms Reported: No symptoms reported HEENT Management Strategies: Routine screening HEENT Self-Management Outcome: 4 (good)    Cardiovascular Cardiovascular Symptoms Reported: No symptoms reported Does patient have uncontrolled Hypertension?: No Cardiovascular Management Strategies: Routine screening Weight: 180 lb 3.2 oz (81.7 kg) (patient reported) Cardiovascular Self-Management Outcome: 4 (good)  Respiratory Respiratory Symptoms Reported: No symptoms reported Respiratory Self-Management Outcome: 4 (good)  Endocrine Endocrine Symptoms Reported: No symptoms reported Is patient diabetic?: Yes Is patient checking blood sugars at home?: Yes List most recent blood sugar  readings, include date and time of day: 01-30-2024 A1C-7.2 FBG 98 Endocrine Self-Management Outcome: 4 (good)  Gastrointestinal Gastrointestinal Symptoms Reported: Constipation Gastrointestinal Management Strategies: Medication therapy Gastrointestinal Self-Management Outcome: 4 (good) Nutrition Risk Screen (CP): No indicators present  Genitourinary Genitourinary Symptoms Reported: No symptoms reported, Other Other Genitourinary Symptoms: Kidney stone reported Genitourinary Self-Management Outcome: 3 (uncertain)  Integumentary Integumentary Symptoms Reported: No symptoms reported Skin Management Strategies: Routine screening Skin Self-Management Outcome: 4 (good)  Musculoskeletal Musculoskelatal Symptoms Reviewed: No symptoms reported Musculoskeletal Self-Management Outcome: 4 (good) Falls in the past year?: No Number of falls in past year: 1 or less Was there an injury with Fall?: No Fall Risk Category Calculator: 0 Patient Fall Risk Level: Low Fall Risk Patient at Risk for Falls Due to: No Fall Risks Fall risk Follow up: Falls evaluation completed  Psychosocial Psychosocial Symptoms Reported: No symptoms reported Behavioral Health Self-Management Outcome: 4 (good) Major Change/Loss/Stressor/Fears (CP): Denies Techniques to Cope with Loss/Stress/Change: Not applicable Quality of Family Relationships: helpful, involved, supportive Do you feel physically threatened by others?: No      01/30/2024    9:18 AM  Depression screen PHQ 2/9  Decreased Interest 0  Down, Depressed, Hopeless 0  PHQ - 2 Score 0    Vitals:   01/30/24 0914  BP: 138/78  Pulse: 71    Medications Reviewed Today     Reviewed by Bertrum Rosina HERO, RN (Registered Nurse) on 01/30/24 at 0912  Med List Status: <None>   Medication Order Taking? Sig Documenting Provider Last Dose Status Informant  Aloe Vera 72 % CREA [id number]  Apply 1 Application topically 2 (two) times daily as needed.  Patient not taking:  Reported on 01/30/2024   Deitra Morton Sebastian Nena, NP  Consider Medication Status and Discontinue   aspirin 81 MG tablet [id number] Yes Take 81  mg by mouth daily. [provider]  Active   buPROPion  (WELLBUTRIN  SR) 150 MG 12 hr tablet [id number] Yes Take 1 tablet (150 mg total) by mouth in the morning. Jolinda Potter M, DO  Active   Capsaicin-Menthol-Methyl Sal (CAPSAICIN-METHYL SAL-MENTHOL) 0.025-1-12 % CREA [id number]  Apply 1 Application topically 2 (two) times daily as needed.  Patient not taking: Reported on 01/30/2024   Deitra Morton Sebastian Nena, NP  Consider Medication Status and Discontinue   Continuous Glucose Sensor (FREESTYLE LIBRE 3 PLUS SENSOR) MISC [id number] Yes Check BGs continuously E11.9. Change sensor every 15 days. Jolinda Potter M, DO  Active   cyclobenzaprine  (FLEXERIL ) 10 MG tablet [id number]  Take 1 tablet (10 mg total) by mouth 3 (three) times daily as needed for muscle spasms.  Patient not taking: Reported on 01/30/2024   Dettinger, Fonda LABOR, MD  Active   docusate sodium  (COLACE) 100 MG capsule [id number] Yes Take 200 mg by mouth daily as needed.  [provider]  Active   glucose blood (GNP EASY TOUCH GLUCOSE TEST) test strip [id number] Yes EVERY DAY Jolinda Potter M, DO  Active   insulin  glargine (LANTUS ) 100 UNIT/ML injection [id number] Yes INJECT 0.22-0.3ML (22-30 UNITS) INTO THESKIN AT BEDTIME  Patient taking differently: 6 Units at bedtime. INJECT 0.22-0.3ML (22-30 UNITS) INTO THESKIN AT BEDTIME   Jolinda Potter M, DO  Active   Insulin  Pen Needle (PEN NEEDLES) 32G X 4 MM MISC [id number] Yes UAD E11.69 Jolinda Potter M, DO  Active   Insulin  Syringe-Needle U-100 (INSULIN  SYRINGE .5CC/31GX5/16) 31G X 5/16 0.5 ML MISC [id number] Yes UAD with Lantus  E11.69 Jolinda Potter M, DO  Active   ketorolac  (TORADOL ) 15 MG/ML injection [id number]  Inject 1 mL (15 mg total) into the vein once as needed for up to 1 dose.  Patient not taking: Reported on  01/30/2024   Deitra Morton Sebastian Nena, NP  Active   levocetirizine (XYZAL ) 5 MG tablet [id number]  Take 1 tablet (5 mg total) by mouth at bedtime as needed for allergies (itching/ rash).  Patient not taking: Reported on 01/30/2024   Jolinda Potter HERO, DO  Active   levothyroxine  (SYNTHROID ) 137 MCG tablet [id number] Yes TAKE ONE TABLET [facility] BEFORE BREAKFAST Jolinda Potter M, [place]  Active   losartan  (COZAAR ) 25 MG tablet [id number] Yes TAKE ONE (1) TABLET EACH DAY Gottschalk, Ashly M, [place]  Active   Multiple Vitamin (MULTIVITAMIN WITH MINERALS) TABS tablet [id number]  Take 1 tablet by mouth daily.  Patient not taking: Reported on 01/30/2024   [provider]  Consider Medication Status and Discontinue Self  nitroGLYCERIN  (NITROSTAT ) 0.4 MG SL tablet [id number]  Place 1 tablet (0.4 mg total) under the tongue every 5 (five) minutes as needed for chest pain.  Patient not taking: Reported on 01/30/2024   Miriam Norris, NP  Expired 01/01/24 2359   NOVOLOG  FLEXPEN 100 UNIT/ML FlexPen [id number] Yes INJECT 10-17 UNITS INTO THE SKIN 3 TIMESDAILY WITH MEALS Jolinda Potter M, DO  Active   ondansetron  (ZOFRAN -ODT) 4 MG disintegrating tablet [id number] Yes Take 1 tablet (4 mg total) by mouth every 8 (eight) hours as needed for nausea or vomiting. Jolinda Potter M, DO  Active   pantoprazole  (PROTONIX ) 40 MG tablet [id number] Yes TAKE 1 TABLET BY MOUTH DAILY FOR 1 MONTH THEN 1 TABLET DAILY AS NEEDED FOR REFLUX Jolinda Potter M, DO  Active   rosuvastatin  (CRESTOR ) 20 MG tablet [id number] Yes TAKE ONE (1) TABLET BY MOUTH EVERY DAY Gottschalk, Ashly M, DO  Active   Semaglutide , 2 MG/DOSE, 8 MG/3ML SOPN [id number] Yes Inject 2 mg as directed once a week. Jolinda Potter M, DO  Active   SUMAtriptan  (IMITREX ) 100 MG tablet [id number] Yes MAY REPEAT IN 2 HOURS IF HEADACHE PERSISTS OR RECURS Jolinda Potter M, DO  Active   tamsulosin  (FLOMAX ) 0.4 MG CAPS capsule [id number]  Take 1 capsule (0.4 mg total)  by mouth daily.  Patient not taking: Reported on 01/30/2024   Deitra Morton Sebastian Nena, NP  Consider Medication Status and Discontinue   triamcinolone  cream (KENALOG ) 0.1 % [id number]  Apply 1 application. topically 2 (two) times daily. X7-10 days for rash  Patient not taking: Reported on 01/30/2024   Jolinda Potter HERO, DO  Consider Medication Status and Discontinue             Recommendation:   Continue Current Plan of Care  Follow Up Plan:   Telephone follow-up in 1 month  Rosina Forte, BSN RN [facility], [facility] RN Care Manager Direct Dial: [phone]  Fax: [phone]

## 2024-02-11 ENCOUNTER — Other Ambulatory Visit: Payer: Self-pay | Admitting: Family Medicine

## 2024-02-11 DIAGNOSIS — N200 Calculus of kidney: Secondary | ICD-10-CM

## 2024-02-11 DIAGNOSIS — R109 Unspecified abdominal pain: Secondary | ICD-10-CM

## 2024-02-18 ENCOUNTER — Ambulatory Visit: Attending: Nurse Practitioner | Admitting: Nurse Practitioner

## 2024-02-18 ENCOUNTER — Encounter: Payer: Self-pay | Admitting: Nurse Practitioner

## 2024-02-18 VITALS — BP 118/70 | HR 82 | Ht 62.0 in | Wt 184.0 lb

## 2024-02-18 DIAGNOSIS — I10 Essential (primary) hypertension: Secondary | ICD-10-CM | POA: Diagnosis not present

## 2024-02-18 DIAGNOSIS — I2510 Atherosclerotic heart disease of native coronary artery without angina pectoris: Secondary | ICD-10-CM

## 2024-02-18 DIAGNOSIS — E669 Obesity, unspecified: Secondary | ICD-10-CM | POA: Diagnosis not present

## 2024-02-18 DIAGNOSIS — R0609 Other forms of dyspnea: Secondary | ICD-10-CM | POA: Diagnosis not present

## 2024-02-18 DIAGNOSIS — E785 Hyperlipidemia, unspecified: Secondary | ICD-10-CM | POA: Diagnosis not present

## 2024-02-18 DIAGNOSIS — R911 Solitary pulmonary nodule: Secondary | ICD-10-CM

## 2024-02-18 DIAGNOSIS — R002 Palpitations: Secondary | ICD-10-CM

## 2024-02-18 NOTE — Progress Notes (Unsigned)
 Office Visit    Patient Name: Melissa Schneider Date of Encounter: 02/18/2024 PCP:  Jolinda Norene HERO DO  [facility]  Cardiologist:  Alvan Carrier, MD Advanced Practice Provider:  Miriam Norris, NP Electrophysiologist:  None   Chief Complaint and HPI    Melissa Schneider is a 68 y.o. female with a hx of CAD, history of chest pain, T2DM, HLD, and HTN who presents today for scheduled follow-up.   Previous negative NST in 2018, history of minimal CAD seen via coronary CTA in 2021, coronary calcium  score of 1.  Last seen by Dr. Carrier Alvan on March 28, 2021.  Was doing well at the time.   I last saw her on September 17, 2022. Was doing well at the time.   ED visit on September 19, 2023 for atypical chest pain.  Workup was overall unremarkable however CTA of chest revealed no evidence of PE/aortic dissection with minimal aortic and coronary artery atherosclerosis, slight cardiomegaly, linear intermixed with ground-glass opacities in basal segments of both lower lobes and in lingular base, which could all be due to atelectasis/combination of atelectasis and pneumonia.  7 mm noncalcified nodule noted in the anterior segment of the right upper lobe.  Recommended to repeat CT of chest in 6 to 12 months.  10/03/2023 - Today she presents for regular follow-up and chest pain evaluation.  She notes sensation of mild/moderate dull ache along left side of chest, describes as deep along left breast tissue, typically notices in the evenings before bedtime, has been ongoing within the past few weeks.  She says her symptoms do seem to be better after being started on Protonix .  Does admit to some dyspnea on exertion has been ongoing for some time.  Typically noted when going up and down stairs or when working outside, says she has to rest for symptoms to be improved.  She has been having palpitations along with this, has been chronic and ongoing for the last 1 to 2 years.  She has  some questions today regarding her recent CT scan performed in the ED. Denies any syncope, presyncope, dizziness, orthopnea, PND, swelling or significant weight changes, acute bleeding, or claudication.  11/18/2023 - Presents today for follow-up. Currently wearing a monitor. She will be done wearing this on May 14th. Notes DOE and palpitations when walking up an incline to get the mail. For 2 weeks, she had sinus issues after working outside and doing yardwork, attributes this to the pollen, this has resolved. Denies any chest pain,, syncope, presyncope, dizziness, orthopnea, PND, swelling or significant weight changes, acute bleeding, or claudication.  02/18/2024 - She is here today and doing well. Does report drinking some caffeine. Denies any chest pain, syncope, presyncope, dizziness, orthopnea, PND, swelling or significant weight changes, acute bleeding, or claudication. Does note some DOE/palpitations at times, but denies any recent symptoms.   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  Volunteers with [employer].  EKGs/Labs/Other Studies Reviewed:   The following studies were reviewed today:   EKG:  EKG is not ordered today.    Cardiac monitor (preliminary report) 11/2023:  Patch Wear Time:  8 days and 16 hours (2025-04-30T15:23:38-0400 to 2025-05-09T07:27:20-0400)   Patient had a min HR of 59 bpm, max HR of 222 bpm, and avg HR of 80 bpm. Predominant underlying rhythm was Sinus Rhythm. 20 Supraventricular Tachycardia runs occurred, the run with the fastest interval lasting 12 beats with a max rate of 222  bpm, the  longest lasting 20 beats with an avg rate of 142 bpm. Isolated SVEs were rare (<1.0%), SVE Couplets were rare (<1.0%), and SVE Triplets were rare (<1.0%). Isolated VEs were rare (<1.0%), and no VE Couplets or VE Triplets were present.  Echo 10/2023: 1. Left ventricular ejection fraction, by estimation, is 60 to 65%. The  left ventricle has normal  function. The left ventricle has no regional  wall motion abnormalities. There is mild left ventricular hypertrophy.  Left ventricular diastolic parameters  were grossly normal. The average left ventricular global longitudinal  strain is -20.3 %. The global longitudinal strain is normal.   2. Right ventricular systolic function is normal. The right ventricular  size is normal. Tricuspid regurgitation signal is inadequate for assessing  PA pressure.   3. Left atrial size was mildly dilated.   4. The mitral valve is normal in structure. Trivial mitral valve  regurgitation. No evidence of mitral stenosis.   5. The aortic valve is grossly normal. There is mild thickening of the  aortic valve. Aortic valve regurgitation is not visualized. No aortic  stenosis is present.   6. The inferior vena cava is normal in size with greater than 50%  respiratory variability, suggesting right atrial pressure of 3 mmHg.   CCTA 08/2019: IMPRESSION: 1. Minimal CAD, CADRADS = 1.   2. The patient's coronary artery calcium  score is 10, which places the patient in the 67th percentile for age and sex matched controls.   3. Normal coronary origin with right dominance.   Lexiscan  01/2017: No diagnostic ST segment changes to indicate ischemia. Low risk Duke treadmill score 4.5. Blood pressure demonstrated a hypertensive response to exercise. Small, mild intensity, fixed anteroapical defect consistent with breast attenuation. This is a low risk study. Nuclear stress EF: 76%.   ETT 01/2016: inalized by Okey Vina GAILS, MD on Mon Jan 23, 2016  3:32 PM There was no ST segment deviation noted during stress. No T wave inversion was noted during stress. Pressure rate product is 20, 701. Test at marginal workload For heart rate achieved, there were no ischemic EKG changes Conclusion limited.  Echo 08/2015: Study Conclusions   - Left ventricle: The cavity size was normal. Wall thickness was at    the upper limits of  normal. Systolic function was normal. The    estimated ejection fraction was in the range of 60% to 65%. Wall    motion was normal; there were no regional wall motion    abnormalities. Left ventricular diastolic function parameters    were normal.  - Ascending aorta: The ascending aorta was mildly ectatic.  - Mitral valve: There was trivial regurgitation.  - Right atrium: Central venous pressure (est): 3 mm Hg.  - Tricuspid valve: There was trivial regurgitation.  - Pulmonary arteries: PA peak pressure: 23 mm Hg (S).  - Pericardium, extracardiac: There was no pericardial effusion.   Impressions:   - Upper normal LV wall thickness with LVEF 60-65% and grossly    normal diastolic function. Mildly ectatic ascending aorta.    Trivial mitral and tricuspid regurgitation. Estimated PASP 23    mmHg.   Recen Risk Assessment/Calculations:    The 10-year ASCVD risk score (Arnett DK, et al., 2019) is: 14%   Values used to calculate the score:     Age: 82 years     Clincally relevant sex: Female     Is Non-Hispanic African American: No     Diabetic: Yes  Tobacco smoker: No     Systolic Blood Pressure: 118 mmHg     Is BP treated: Yes     HDL Cholesterol: 75 mg/dL     Total Cholesterol: 225 mg/dL  Review of Systems    All other systems reviewed and are otherwise negative except as noted above.  Physical Exam    VS:  BP 118/70   Pulse 82   Ht 5' 2 (1.575 m)   Wt 184 lb (83.5 kg)   SpO2 97%   BMI 33.65 kg/m  , BMI Body mass index is 33.65 kg/m.  Wt Readings from Last 3 Encounters:  02/18/24 184 lb (83.5 kg)  01/30/24 180 lb 3.2 oz (81.7 kg)  01/27/24 182 lb (82.6 kg)     GEN: Obese, 68 y.o. female in no acute distress. HEENT: normal. Neck: Supple, no JVD, carotid bruits, or masses. Cardiac: S1/S2, RRR, no murmurs, rubs, or gallops. No clubbing, cyanosis, edema. Radials/PT 2+ and equal bilaterally.  Respiratory:  Respirations regular and unlabored, clear to auscultation  bilaterally. GI: Soft, nontender, nondistended. MS: No deformity or atrophy. Skin: Warm and dry, no rash. Neuro:  Strength and sensation are intact. Psych: Normal affect.  Assessment & Plan    CAD No chest pain per her report. Most recent workup in ED was overall unremarkable, ruled out for ACS.  CCTA in 2021 showed minimal CAD, coronary calcium  score was 10.  No indication for ischemic evaluation at this time.  Continue current medication regimen. Heart healthy diet and regular cardiovascular exercise encouraged.  Care and ED precautions discussed.  HTN Blood pressure stable.  Continue current medication regimen. Heart healthy diet and regular cardiovascular exercise encouraged.   HLD LDL 75 in June 2024.  Continue rosuvastatin . Heart healthy diet and regular cardiovascular exercise encouraged.   4.  Dyspnea on exertion, palpitations Etiology unclear.  Palpitations improve with rest. Most recent echo does not explain her symptoms  Discussed to wean off caffeine. Continue current medication regimen, pt declines medication adjustment at this time, favors lifestyle modifications.  Continue follow-up with PCP.  Care and ED precautions discussed.   5.  Obesity  Weight loss via diet and exercise encouraged. Discussed the impact being overweight would have on cardiovascular risk.  6. Pulmonary nodule Most recent CTA of chest revealed no evidence of PE/aortic dissection with minimal aortic and coronary artery atherosclerosis, slight cardiomegaly, linear intermixed with ground-glass opacities in basal segments of both lower lobes and in lingular base, which could all be due to atelectasis/combination of atelectasis and pneumonia.  7 mm noncalcified nodule noted in the anterior segment of the right upper lobe.  Went over and reviewed these findings with patient.  She verbalized understanding.  Recommended to repeat CT of chest in 6 to 12 months, plan to arrange next year in March 2026. Continue to  follow with PCP.  Disposition: Follow up in 6 months with Alvan Carrier, MD or APP.  Signed, Almarie Crate, NP

## 2024-02-18 NOTE — Patient Instructions (Addendum)

## 2024-02-27 ENCOUNTER — Ambulatory Visit (INDEPENDENT_AMBULATORY_CARE_PROVIDER_SITE_OTHER): Payer: PPO

## 2024-02-27 VITALS — BP 118/70 | HR 82 | Ht 62.0 in | Wt 184.0 lb

## 2024-02-27 DIAGNOSIS — Z0000 Encounter for general adult medical examination without abnormal findings: Secondary | ICD-10-CM

## 2024-02-27 NOTE — Patient Instructions (Signed)
 Ms. Bales , Thank you for taking time out of your busy schedule to complete your Annual Wellness Visit with me. I enjoyed our conversation and look forward to speaking with you again next year. I, as well as your care team,  appreciate your ongoing commitment to your health goals. Please review the following plan we discussed and let me know if I can assist you in the future. Your Game plan/ To Do List    Referrals: If you haven't heard from the office you've been referred to, please reach out to them at the phone provided.   Follow up Visits: We will see or speak with you next year for your Next Medicare AWV with our clinical staff on 03/01/25 at 10:40a.m. Have you seen your provider in the last 6 months (3 months if uncontrolled diabetes)? Yes  Clinician Recommendations:  Aim for 30 minutes of exercise or brisk walking, 6-8 glasses of water , and 5 servings of fruits and vegetables each day.       This is a list of the screenings recommended for you:  Health Maintenance  Topic Date Due   Zoster (Shingles) Vaccine (1 of 2) Never done   COVID-19 Vaccine (1 - 2024-25 season) Never done   Complete foot exam   10/03/2023   Medicare Annual Wellness Visit  02/26/2024   Flu Shot  02/14/2024   Colon Cancer Screening  04/15/2024*   Pneumococcal Vaccine for age over 64 (3 of 3 - PCV20 or PCV21) 08/29/2024*   Yearly kidney health urinalysis for diabetes  04/15/2024   Eye exam for diabetics  05/19/2024   DEXA scan (bone density measurement)  05/23/2024   Hemoglobin A1C  07/29/2024   Mammogram  10/08/2024   Yearly kidney function blood test for diabetes  01/26/2025   DTaP/Tdap/Td vaccine (4 - Td or Tdap) 07/27/2029   Hepatitis C Screening  Completed   HPV Vaccine  Aged Out   Meningitis B Vaccine  Aged Out  *Topic was postponed. The date shown is not the original due date.    Advanced directives: (Declined) Advance directive discussed with you today. Even though you declined this today, please  call our office should you change your mind, and we can give you the proper paperwork for you to fill out. Advance Care Planning is important because it:  [x]  Makes sure you receive the medical care that is consistent with your values, goals, and preferences  [x]  It provides guidance to your family and loved ones and reduces their decisional burden about whether or not they are making the right decisions based on your wishes.  Follow the link provided in your after visit summary or read over the paperwork we have mailed to you to help you started getting your Advance Directives in place. If you need assistance in completing these, please reach out to us  so that we can help you!  See attachments for Preventive Care and Fall Prevention Tips.

## 2024-02-27 NOTE — Progress Notes (Signed)
 Subjective:   Melissa Schneider is a 68 y.o. who presents for a Medicare Wellness preventive visit.  As a reminder, Annual Wellness Visits don't include a physical exam, and some assessments may be limited, especially if this visit is performed virtually. We may recommend an in-person follow-up visit with your provider if needed.  Visit Complete: Virtual I connected with  Melissa Schneider on 02/27/24 by a audio enabled telemedicine application and verified that I am speaking with the correct person using two identifiers.  Patient Location: Home  Provider Location: Home Office  I discussed the limitations of evaluation and management by telemedicine. The patient expressed understanding and agreed to proceed.  Vital Signs: Because this visit was a virtual/telehealth visit, some criteria may be missing or patient reported. Any vitals not documented were not able to be obtained and vitals that have been documented are patient reported.  VideoDeclined- This patient declined Librarian, academic. Therefore the visit was completed with audio only.  Persons Participating in Visit: Patient.  AWV Questionnaire: No: Patient Medicare AWV questionnaire was not completed prior to this visit.  Cardiac Risk Factors include: advanced age (>33men, >3 women);diabetes mellitus;dyslipidemia;hypertension;obesity (BMI >30kg/m2)     Objective:    Today's Vitals   02/27/24 1041  BP: 118/70  Pulse: 82  Weight: 184 lb (83.5 kg)  Height: 5' 2 (1.575 m)   Body mass index is 33.65 kg/m.     02/27/2024   10:50 AM 01/30/2024    9:28 AM 01/02/2024    9:36 AM 12/05/2023    9:35 AM 11/07/2023    9:42 AM 09/11/2023   11:30 AM 02/26/2023    3:08 PM  Advanced Directives  Does Patient Have a Medical Advance Directive? No No No No No No Yes  Type of Tax inspector;Living will  Copy of Healthcare Power of Attorney in Chart?       No - copy  requested  Would patient like information on creating a medical advance directive?  No - Patient declined No - Patient declined No - Patient declined No - Patient declined No - Patient declined     Current Medications (verified) Outpatient Encounter Medications as of 02/27/2024  Medication Sig   Aloe Vera 72 % CREA Apply 1 Application topically 2 (two) times daily as needed.   aspirin 81 MG tablet Take 81 mg by mouth daily.   buPROPion  (WELLBUTRIN  SR) 150 MG 12 hr tablet Take 1 tablet (150 mg total) by mouth in the morning.   Capsaicin-Menthol-Methyl Sal (CAPSAICIN-METHYL SAL-MENTHOL) 0.025-1-12 % CREA Apply 1 Application topically 2 (two) times daily as needed.   Continuous Glucose Sensor (FREESTYLE LIBRE 3 PLUS SENSOR) MISC Check BGs continuously E11.9. Change sensor every 15 days.   cyclobenzaprine  (FLEXERIL ) 10 MG tablet Take 1 tablet (10 mg total) by mouth 3 (three) times daily as needed for muscle spasms.   docusate sodium  (COLACE) 100 MG capsule Take 200 mg by mouth daily as needed.    glucose blood (GNP EASY TOUCH GLUCOSE TEST) test strip EVERY DAY   insulin  glargine (LANTUS ) 100 UNIT/ML injection INJECT 0.22-0.3ML (22-30 UNITS) INTO THESKIN AT BEDTIME   Insulin  Pen Needle (PEN NEEDLES) 32G X 4 MM MISC UAD E11.69   Insulin  Syringe-Needle U-100 (INSULIN  SYRINGE .5CC/31GX5/16) 31G X 5/16 0.5 ML MISC UAD with Lantus  E11.69   levocetirizine (XYZAL ) 5 MG tablet Take 1 tablet (5 mg total) by mouth at bedtime as  needed for allergies (itching/ rash).   levothyroxine  (SYNTHROID ) 137 MCG tablet TAKE ONE TABLET EACH MORNING BEFORE BREAKFAST   losartan  (COZAAR ) 25 MG tablet TAKE ONE (1) TABLET EACH DAY   Multiple Vitamin (MULTIVITAMIN WITH MINERALS) TABS tablet Take 1 tablet by mouth daily.   nitroGLYCERIN  (NITROSTAT ) 0.4 MG SL tablet Place 1 tablet (0.4 mg total) under the tongue every 5 (five) minutes as needed for chest pain.   NOVOLOG  FLEXPEN 100 UNIT/ML FlexPen INJECT 10-17 UNITS INTO THE  SKIN 3 TIMESDAILY WITH MEALS   ondansetron  (ZOFRAN -ODT) 4 MG disintegrating tablet Take 1 tablet (4 mg total) by mouth every 8 (eight) hours as needed for nausea or vomiting.   pantoprazole  (PROTONIX ) 40 MG tablet TAKE 1 TABLET BY MOUTH DAILY FOR 1 MONTH THEN 1 TABLET DAILY AS NEEDED FOR REFLUX   rosuvastatin  (CRESTOR ) 20 MG tablet TAKE ONE (1) TABLET BY MOUTH EVERY DAY   Semaglutide , 2 MG/DOSE, 8 MG/3ML SOPN Inject 2 mg as directed once a week.   SUMAtriptan  (IMITREX ) 100 MG tablet MAY REPEAT IN 2 HOURS IF HEADACHE PERSISTS OR RECURS   triamcinolone  cream (KENALOG ) 0.1 % Apply 1 application. topically 2 (two) times daily. X7-10 days for rash   No facility-administered encounter medications on file as of 02/27/2024.    Allergies (verified) Codeine, Flomax  [tamsulosin  hcl], Gabapentin , and Lisinopril   History: Past Medical History:  Diagnosis Date   Anemia 07/23/2011   Anxiety    Diabetes mellitus    Diabetic gastroparesis (HCC) 07/23/2011   Suspected   Headache(784.0)    Hearing difficulty    Hyperlipidemia    Hypothyroidism    Kidney stones    Microcytic anemia 07/23/2011   Recurrent obstructive pyelonephritis 08/14/2013   Thalassemia minor    ????? not really worked up per pt but daughter has it???   Past Surgical History:  Procedure Laterality Date   COLONOSCOPY  2004   Dr. Zachary Porch: small fissue and hemorrhoids   COLONOSCOPY  12/2011   Dr. Burnette: normal   CYSTOSCOPY W/ URETERAL STENT PLACEMENT Right 08/13/2013   Procedure: CYSTOSCOPY WITH RETROGRADE PYELOGRAM/URETERAL STENT PLACEMENT;  Surgeon: Mohammad I Javaid, MD;  Location: [facility];  Service: Urology;  Laterality: Right;   EXTRACORPOREAL SHOCK WAVE LITHOTRIPSY Right 08/19/2013   Procedure: EXTRACORPOREAL SHOCK WAVE LITHOTRIPSY (ESWL) RIGHT URETERAL CALCULUS;  Surgeon: Emery LILLETTE Blaze, MD;  Location: [facility];  Service: Urology;  Laterality: Right;   WRIST SURGERY Right    Family History  Problem Relation Age of  Onset   Colon polyps Mother    Rheum arthritis Sister    Breast cancer Maternal Aunt    Breast cancer Maternal Aunt    Colon cancer Maternal Grandmother        age 30   CAD Brother    Social History   Socioeconomic History   Marital status: Married    Spouse name: Johnie   Number of children: 1   Years of education: Not on file   Highest education level: Some college, no degree  Occupational History   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   Occupation: Retired  Tobacco Use   Smoking status: Never   Smokeless tobacco: Never   Tobacco comments:    Never smoked  Vaping Use   Vaping status: Never Used  Substance and Sexual Activity   Alcohol use: No    Alcohol/week: 0.0 standard drinks of alcohol   Drug use: No   Sexual activity: Not Currently  Other Topics Concern   Not on file  Social History Narrative   Lives home with husband - Daughter lives in [place] - she has a great grandchild also   Social Drivers of Corporate investment banker Strain: Low Risk  (02/27/2024)   Overall Financial Resource Strain (CARDIA)    Difficulty of Paying Living Expenses: Not hard at all  Food Insecurity: No Food Insecurity (02/27/2024)   Hunger Vital Sign    Worried About Running Out of Food in the Last Year: Never true    Ran Out of Food in the Last Year: Never true  Transportation Needs: No Transportation Needs (02/27/2024)   PRAPARE - Administrator, Civil Service (Medical): No    Lack of Transportation (Non-Medical): No  Physical Activity: Insufficiently Active (02/27/2024)   Exercise Vital Sign    Days of Exercise per Week: 3 days    Minutes of Exercise per Session: 30 min  Stress: No Stress Concern Present (02/27/2024)   [employer] of Occupational Health - Occupational Stress Questionnaire    Feeling of Stress: Not at all  Social Connections: Socially Integrated (02/27/2024)   Social Connection and Isolation Panel    Frequency of  Communication with Friends and Family: More than three times a week    Frequency of Social Gatherings with Friends and Family: More than three times a week    Attends Religious Services: More than 4 times per year    Active Member of [employer] or Organizations: Yes    Attends Engineer, structural: More than 4 times per year    Marital Status: Married    Tobacco Counseling Counseling given: Yes Tobacco comments: Never smoked    Clinical Intake:  Pre-visit preparation completed: Yes  Pain : No/denies pain     BMI - recorded: 33.65 Nutritional Status: BMI > 30  Obese Nutritional Risks: None Diabetes: Yes  Lab Results  Component Value Date   HGBA1C 7.2 (H) 01/27/2024   HGBA1C 7.7 (H) 08/30/2023   HGBA1C 6.8 (H) 04/16/2023     How often do you need to have someone help you when you read instructions, pamphlets, or other written materials from your doctor or pharmacy?: 1 - Never  Interpreter Needed?: No  Information entered by :: alia t/cma   Activities of Daily Living     02/27/2024   10:44 AM  In your present state of health, do you have any difficulty performing the following activities:  Hearing? 0  Vision? 0  Difficulty concentrating or making decisions? 0  Walking or climbing stairs? 0  Dressing or bathing? 0  Doing errands, shopping? 0  Preparing Food and eating ? N  Using the Toilet? N  In the past six months, have you accidently leaked urine? N  Do you have problems with loss of bowel control? N  Managing your Medications? N  Managing your Finances? N  Housekeeping or managing your Housekeeping? N    Patient Care Team: Jolinda Norene HERO, DO as PCP - General (Family Medicine) Alvan Dorn FALCON, MD as PCP - Cardiology (Cardiology) Shaaron Lamar HERO, MD as Consulting Physician (Gastroenterology) Miriam Norris, NP as Nurse Practitioner (Cardiology) [facility] Bertrum Rosina HERO, RN as VBCI Care Management (General  Practice) Bertrum Rosina HERO, RN  I have updated your Care Teams any recent Medical Services you may have received from other providers in the past year.     Assessment:   This is a routine wellness  examination for Melissa Schneider.  Hearing/Vision screen Hearing Screening - Comments:: Pt wears hearing aids Vision Screening - Comments:: Pt wear glasses/pt goes to [facility] in [place]/last 05/2023   Goals Addressed             This Visit's Progress    Exercise 3x per week (30 min per time)   On track    Try to exercise for at least 30 minutes, 3 times weekly. 01/22/2022-Travel more.     Patient Stated       Per pt would like to visit families more often       Depression Screen     02/27/2024   10:51 AM 01/30/2024    9:18 AM 01/27/2024   11:26 AM 01/08/2024    3:43 PM 01/02/2024    9:13 AM 12/05/2023    9:27 AM 11/07/2023    9:48 AM  PHQ 2/9 Scores  PHQ - 2 Score 0 0 0 0 0 0 0  PHQ- 9 Score 0  0        Fall Risk     02/27/2024   10:43 AM 01/30/2024    9:18 AM 01/27/2024   11:26 AM 01/08/2024    3:43 PM 01/02/2024    9:13 AM  Fall Risk   Falls in the past year? 0 0 0 0 0  Number falls in past yr: 0 0 0 0 0  Injury with Fall? 0 0 0 0 0  Risk for fall due to : No Fall Risks No Fall Risks No Fall Risks No Fall Risks No Fall Risks  Follow up Falls evaluation completed Falls evaluation completed Falls evaluation completed Falls evaluation completed Falls evaluation completed    MEDICARE RISK AT HOME:  Medicare Risk at Home Any stairs in or around the home?: No If so, are there any without handrails?: No Home free of loose throw rugs in walkways, pet beds, electrical cords, etc?: Yes Adequate lighting in your home to reduce risk of falls?: Yes Life alert?: No Use of a cane, walker or w/c?: No Grab bars in the bathroom?: No Shower chair or bench in shower?: Yes Elevated toilet seat or a handicapped toilet?: Yes  TIMED UP AND GO:  Was the test performed?  no  Cognitive  Function: 6CIT completed        02/27/2024   10:52 AM 02/26/2023    3:08 PM 01/22/2022    3:05 PM 01/19/2020   10:17 AM 01/12/2019   10:39 AM  6CIT Screen  What Year? 0 points 0 points 0 points 0 points 0 points  What month? 0 points 0 points 0 points 0 points 0 points  What time? 0 points 0 points 0 points 0 points 0 points  Count back from 20 0 points 0 points 0 points 2 points 0 points  Months in reverse 4 points 0 points 0 points 0 points 0 points  Repeat phrase 0 points 0 points 0 points 0 points 0 points  Total Score 4 points 0 points 0 points 2 points 0 points    Immunizations Immunization History  Administered Date(s) Administered   Influenza Whole 05/16/2010   Influenza,inj,Quad PF,6+ Mos 05/10/2015, 04/26/2016, 06/19/2017, 05/28/2018   Pneumococcal Conjugate-13 01/29/2014   Pneumococcal Polysaccharide-23 07/16/2008, 01/20/2009, 01/30/2016   Td 07/17/2007, 11/23/2007   Tdap 07/28/2019    Screening Tests Health Maintenance  Topic Date Due   Zoster Vaccines- Shingrix (1 of 2) Never done   COVID-19 Vaccine (1 - 2024-25 season) Never done  FOOT EXAM  10/03/2023   INFLUENZA VACCINE  02/14/2024   Colonoscopy  04/15/2024 (Originally 01/09/2017)   Pneumococcal Vaccine: 50+ Years (3 of 3 - PCV20 or PCV21) 08/29/2024 (Originally 01/29/2021)   Diabetic kidney evaluation - Urine ACR  04/15/2024   OPHTHALMOLOGY EXAM  05/19/2024   DEXA SCAN  05/23/2024   HEMOGLOBIN A1C  07/29/2024   MAMMOGRAM  10/08/2024   Diabetic kidney evaluation - eGFR measurement  01/26/2025   Medicare Annual Wellness (AWV)  02/26/2025   DTaP/Tdap/Td (4 - Td or Tdap) 07/27/2029   Hepatitis C Screening  Completed   HPV VACCINES  Aged Out   Meningococcal B Vaccine  Aged Out    Health Maintenance  Health Maintenance Due  Topic Date Due   Zoster Vaccines- Shingrix (1 of 2) Never done   COVID-19 Vaccine (1 - 2024-25 season) Never done   FOOT EXAM  10/03/2023   INFLUENZA VACCINE  02/14/2024   Health  Maintenance Items Addressed: See Nurse Notes at the end of this note  Additional Screening:  Vision Screening: Recommended annual ophthalmology exams for early detection of glaucoma and other disorders of the eye. Would you like a referral to an eye doctor? No    Dental Screening: Recommended annual dental exams for proper oral hygiene  Community Resource Referral / Chronic Care Management: CRR required this visit?  No   CCM required this visit?  No   Plan:    I have personally reviewed and noted the following in the patient's chart:   Medical and social history Use of alcohol, tobacco or illicit drugs  Current medications and supplements including opioid prescriptions. Patient is not currently taking opioid prescriptions. Functional ability and status Nutritional status Physical activity Advanced directives List of other physicians Hospitalizations, surgeries, and ER visits in previous 12 months Vitals Screenings to include cognitive, depression, and falls Referrals and appointments  In addition, I have reviewed and discussed with patient certain preventive protocols, quality metrics, and best practice recommendations. A written personalized care plan for preventive services as well as general preventive health recommendations were provided to patient.   Ozie Ned, CMA   02/27/2024   After Visit Summary: (MyChart) Due to this being a telephonic visit, the after visit summary with patients personalized plan was offered to patient via MyChart   Notes: PCP Follow Up Recommendations: Pt is aware and due the following:Foot exam, flu and shingles vaccine

## 2024-02-28 ENCOUNTER — Encounter: Payer: Self-pay | Admitting: Pharmacist

## 2024-02-28 DIAGNOSIS — N200 Calculus of kidney: Secondary | ICD-10-CM | POA: Diagnosis not present

## 2024-03-02 ENCOUNTER — Ambulatory Visit: Payer: Self-pay | Admitting: Urology

## 2024-03-02 ENCOUNTER — Other Ambulatory Visit: Payer: Self-pay | Admitting: *Deleted

## 2024-03-02 NOTE — Patient Outreach (Signed)
 Complex Care Management   Visit Note  03/02/2024  Name:  Melissa Schneider MRN: [id number] DOB: 08-31-1955  Situation: Referral received for Complex Care Management related to Diabetes with Complications I obtained verbal consent from Patient.  Visit completed with patient  on the phone  Background:   Past Medical History:  Diagnosis Date   Anemia 07/23/2011   Anxiety    Diabetes mellitus    Diabetic gastroparesis (HCC) 07/23/2011   Suspected   Headache(784.0)    Hearing difficulty    Hyperlipidemia    Hypothyroidism    Kidney stones    Microcytic anemia 07/23/2011   Recurrent obstructive pyelonephritis 08/14/2013   Thalassemia minor    ????? not really worked up per pt but daughter has it???    Assessment: Patient Reported Symptoms:  Cognitive Cognitive Status: No symptoms reported Cognitive/Intellectual Conditions Management [RPT]: None reported or documented in medical history or problem list   Health Maintenance Behaviors: Annual physical exam Healing Pattern: Average Health Facilitated by: Rest  Neurological Neurological Review of Symptoms: Vision changes Neurological Comment: reports cataract and will likely need intervention during next exam  HEENT HEENT Symptoms Reported: No symptoms reported HEENT Management Strategies: Routine screening HEENT Self-Management Outcome: 4 (good)    Cardiovascular Cardiovascular Symptoms Reported: No symptoms reported Does patient have uncontrolled Hypertension?: No Cardiovascular Management Strategies: Routine screening, Medication therapy Weight: 182 lb 6.4 oz (82.7 kg) Cardiovascular Self-Management Outcome: 4 (good)  Respiratory Respiratory Symptoms Reported: No symptoms reported Respiratory Self-Management Outcome: 4 (good)  Endocrine Endocrine Symptoms Reported: No symptoms reported Is patient diabetic?: Yes Is patient checking blood sugars at home?: Yes List most recent blood sugar readings, include date and time of  day: 03-02-2024 0800 124 Endocrine Self-Management Outcome: 4 (good)  Gastrointestinal Gastrointestinal Symptoms Reported: No symptoms reported Gastrointestinal Self-Management Outcome: 4 (good) Nutrition Risk Screen (CP): No indicators present  Genitourinary Genitourinary Symptoms Reported: No symptoms reported Other Genitourinary Symptoms: Kidney stone reported - states the stone is in the bottom of the bladder. Waiting for CT w/ contrast Pelvis to eval ureter Genitourinary Self-Management Outcome: 3 (uncertain)  Integumentary Integumentary Symptoms Reported: No symptoms reported    Musculoskeletal Musculoskelatal Symptoms Reviewed: No symptoms reported   Falls in the past year?: No Number of falls in past year: 1 or less Was there an injury with Fall?: No Fall Risk Category Calculator: 0 Patient Fall Risk Level: Low Fall Risk Patient at Risk for Falls Due to: No Fall Risks Fall risk Follow up: Falls evaluation completed  Psychosocial Psychosocial Symptoms Reported: No symptoms reported Behavioral Health Self-Management Outcome: 4 (good) Major Change/Loss/Stressor/Fears (CP): Denies Techniques to Cope with Loss/Stress/Change: Not applicable Quality of Family Relationships: involved, helpful, supportive Do you feel physically threatened by others?: No      03/02/2024    9:24 AM  Depression screen PHQ 2/9  Decreased Interest 0  Down, Depressed, Hopeless 0  PHQ - 2 Score 0    Vitals:   03/02/24 0918  BP: 131/67  Pulse: 78    Medications Reviewed Today     Reviewed by Bertrum Rosina HERO, RN (Registered Nurse) on 03/02/24 at 0915  Med List Status: <None>   Medication Order Taking? Sig Documenting Provider Last Dose Status Informant  Aloe Vera 72 % CREA [id number]  Apply 1 Application topically 2 (two) times daily as needed.  Patient not taking: Reported on 03/02/2024   Deitra Morton Sebastian Nena, NP  Consider Medication Status and Discontinue   aspirin 81 MG tablet [id number]  Yes Take 81 mg by mouth daily. [provider]  Active   buPROPion  (WELLBUTRIN  SR) 150 MG 12 hr tablet [id number] Yes Take 1 tablet (150 mg total) by mouth in the morning. Jolinda Potter M, DO  Active   Capsaicin-Menthol-Methyl Sal (CAPSAICIN-METHYL SAL-MENTHOL) 0.025-1-12 % CREA [id number]  Apply 1 Application topically 2 (two) times daily as needed.  Patient not taking: Reported on 03/02/2024   Deitra Morton Sebastian Nena, NP  Consider Medication Status and Discontinue   Continuous Glucose Sensor (FREESTYLE LIBRE 3 PLUS SENSOR) MISC [id number] Yes Check BGs continuously E11.9. Change sensor every 15 days. Jolinda Potter M, DO  Active   cyclobenzaprine  (FLEXERIL ) 10 MG tablet [id number]  Take 1 tablet (10 mg total) by mouth 3 (three) times daily as needed for muscle spasms.  Patient not taking: Reported on 03/02/2024   Dettinger, Fonda LABOR, MD  Consider Medication Status and Discontinue   docusate sodium  (COLACE) 100 MG capsule [id number] Yes Take 200 mg by mouth daily as needed.  [provider]  Active   glucose blood (GNP EASY TOUCH GLUCOSE TEST) test strip [id number] Yes EVERY DAY Jolinda Potter M, DO  Active   insulin  glargine (LANTUS ) 100 UNIT/ML injection [id number] Yes INJECT 0.22-0.3ML (22-30 UNITS) INTO THESKIN AT BEDTIME  Patient taking differently: 6 Units at bedtime. INJECT 0.22-0.3ML (22-30 UNITS) INTO THESKIN AT BEDTIME   Jolinda Potter M, DO  Active   Insulin  Pen Needle (PEN NEEDLES) 32G X 4 MM MISC [id number] Yes UAD E11.69 Jolinda Potter M, DO  Active   Insulin  Syringe-Needle U-100 (INSULIN  SYRINGE .5CC/31GX5/16) 31G X 5/16 0.5 ML MISC [id number] Yes UAD with Lantus  E11.69 Jolinda Potter M, DO  Active   levocetirizine (XYZAL ) 5 MG tablet [id number]  Take 1 tablet (5 mg total) by mouth at bedtime as needed for allergies (itching/ rash).  Patient not taking: Reported on 03/02/2024   Jolinda Potter HERO, DO  Consider Medication Status and Discontinue    levothyroxine  (SYNTHROID ) 137 MCG tablet [id number] Yes TAKE ONE TABLET [facility] BEFORE BREAKFAST Jolinda Potter M, [place]  Active   losartan  (COZAAR ) 25 MG tablet [id number] Yes TAKE ONE (1) TABLET EACH DAY Gottschalk, Ashly M, [place]  Active   Multiple Vitamin (MULTIVITAMIN WITH MINERALS) TABS tablet [id number]  Take 1 tablet by mouth daily.  Patient not taking: Reported on 03/02/2024   [provider]  Consider Medication Status and Discontinue Self  nitroGLYCERIN  (NITROSTAT ) 0.4 MG SL tablet [id number]  Place 1 tablet (0.4 mg total) under the tongue every 5 (five) minutes as needed for chest pain.  Patient not taking: Reported on 03/02/2024   Miriam Norris, NP  Expired 02/27/24 2359   NOVOLOG  FLEXPEN 100 UNIT/ML FlexPen [id number] Yes INJECT 10-17 UNITS INTO THE SKIN 3 TIMESDAILY WITH MEALS Jolinda Potter M, DO  Active   ondansetron  (ZOFRAN -ODT) 4 MG disintegrating tablet [id number] Yes Take 1 tablet (4 mg total) by mouth every 8 (eight) hours as needed for nausea or vomiting. Jolinda Potter M, DO  Active   pantoprazole  (PROTONIX ) 40 MG tablet [id number] Yes TAKE 1 TABLET BY MOUTH DAILY FOR 1 MONTH THEN 1 TABLET DAILY AS NEEDED FOR REFLUX Jolinda Potter M, DO  Active   rosuvastatin  (CRESTOR ) 20 MG tablet [id number] Yes TAKE ONE (1) TABLET BY MOUTH EVERY DAY Jolinda Potter M, DO  Active   Semaglutide , 2 MG/DOSE, 8 MG/3ML SOPN [id number] Yes Inject 2 mg as directed once a week. Jolinda Potter M, DO  Active   SUMAtriptan  (IMITREX ) 100  MG tablet [id number] Yes MAY REPEAT IN 2 HOURS IF HEADACHE PERSISTS OR RECURS Jolinda Potter M, DO  Active   triamcinolone  cream (KENALOG ) 0.1 % [id number]  Apply 1 application. topically 2 (two) times daily. X7-10 days for rash  Patient not taking: Reported on 03/02/2024   Jolinda Potter HERO, DO  Consider Medication Status and Discontinue             Recommendation:   Continue Current Plan of Care  Follow Up Plan:   Telephone follow-up in  1 month  Rosina Forte, BSN RN [facility], [facility] RN Care Manager Direct Dial: [phone]  Fax: [phone]

## 2024-03-02 NOTE — Patient Instructions (Signed)
 Visit Information  Thank you for taking time to visit with me today. Please don't hesitate to contact me if I can be of assistance to you before our next scheduled appointment.  Your next care management appointment is by telephone on 03-30-2024 at 9:00 am  Telephone follow-up in 1 month  Please call the care guide team at [phone] if you need to cancel, schedule, or reschedule an appointment.   Please call the Suicide and Crisis Lifeline: 988 call the [place]  National Suicide Prevention Lifeline: (319) 013-8657 or TTY: 7850773302 TTY (907)880-6742) to talk to a trained counselor call 1-800-273-TALK (toll free, 24 hour hotline) call the [facility]: [phone] call 911 if you are experiencing a Mental Health or Behavioral Health Crisis or need someone to talk to.  Rosina Forte, BSN RN [facility], [facility] RN Care Manager Direct Dial: [phone]  Fax: [phone]

## 2024-03-03 ENCOUNTER — Ambulatory Visit: Admitting: Urology

## 2024-03-03 DIAGNOSIS — Z87442 Personal history of urinary calculi: Secondary | ICD-10-CM | POA: Diagnosis not present

## 2024-03-05 ENCOUNTER — Other Ambulatory Visit: Payer: Self-pay | Admitting: Family Medicine

## 2024-03-05 DIAGNOSIS — E1169 Type 2 diabetes mellitus with other specified complication: Secondary | ICD-10-CM

## 2024-03-12 DIAGNOSIS — N200 Calculus of kidney: Secondary | ICD-10-CM | POA: Diagnosis not present

## 2024-03-27 ENCOUNTER — Ambulatory Visit (HOSPITAL_COMMUNITY)
Admission: RE | Admit: 2024-03-27 | Discharge: 2024-03-27 | Disposition: A | Discharge status: Home / Self Care | Source: Ambulatory Visit | Attending: Family Medicine | Admitting: Family Medicine

## 2024-03-27 DIAGNOSIS — R918 Other nonspecific abnormal finding of lung field: Secondary | ICD-10-CM | POA: Diagnosis not present

## 2024-03-27 DIAGNOSIS — I700 Atherosclerosis of aorta: Secondary | ICD-10-CM | POA: Diagnosis not present

## 2024-03-27 DIAGNOSIS — R911 Solitary pulmonary nodule: Secondary | ICD-10-CM | POA: Insufficient documentation

## 2024-03-30 ENCOUNTER — Other Ambulatory Visit: Payer: Self-pay | Admitting: *Deleted

## 2024-03-30 NOTE — Patient Outreach (Signed)
 Complex Care Management   Visit Note  03/30/2024  Name:  Melissa Schneider MRN: [id number] DOB: 09-26-1955  Situation: Referral received for Complex Care Management related to Diabetes with Complications and HTN I obtained verbal consent from Patient.  Visit completed with Patient  on the phone  Background:   Past Medical History:  Diagnosis Date   Anemia 07/23/2011   Anxiety    Diabetes mellitus    Diabetic gastroparesis (HCC) 07/23/2011   Suspected   Headache(784.0)    Hearing difficulty    Hyperlipidemia    Hypothyroidism    Kidney stones    Microcytic anemia 07/23/2011   Recurrent obstructive pyelonephritis 08/14/2013   Thalassemia minor    ????? not really worked up per pt but daughter has it???    Assessment: Patient Reported Symptoms:  Cognitive Cognitive Status: No symptoms reported Cognitive/Intellectual Conditions Management [RPT]: None reported or documented in medical history or problem list   Health Maintenance Behaviors: Annual physical exam Healing Pattern: Average Health Facilitated by: Rest  Neurological Neurological Review of Symptoms: Headaches Neurological Self-Management Outcome: 3 (uncertain) Neurological Comment: posterior pain -  goes away quickly  HEENT HEENT Symptoms Reported: Eye pain HEENT Management Strategies: Routine screening HEENT Self-Management Outcome: 3 (uncertain)    Cardiovascular Cardiovascular Symptoms Reported: No symptoms reported Does patient have uncontrolled Hypertension?: No Weight: 178 lb 6.4 oz (80.9 kg) (patient reported) Cardiovascular Self-Management Outcome: 4 (good)  Respiratory Respiratory Symptoms Reported: No symptoms reported Respiratory Self-Management Outcome: 4 (good)  Endocrine Endocrine Symptoms Reported: Hypoglycemia Is patient diabetic?: Yes Is patient checking blood sugars at home?: Yes List most recent blood sugar readings, include date and time of day: 03-30-24 at 0700 49 repeat 154 Endocrine  Self-Management Outcome: 4 (good)  Gastrointestinal Gastrointestinal Symptoms Reported: No symptoms reported   Nutrition Risk Screen (CP): No indicators present  Genitourinary Genitourinary Symptoms Reported: No symptoms reported Genitourinary Self-Management Outcome: 3 (uncertain)  Integumentary Integumentary Symptoms Reported: No symptoms reported Skin Management Strategies: Routine screening  Musculoskeletal Musculoskelatal Symptoms Reviewed: Back pain Musculoskeletal Management Strategies: Medication therapy, Routine screening Musculoskeletal Self-Management Outcome: 3 (uncertain) Falls in the past year?: No Number of falls in past year: 1 or less Was there an injury with Fall?: No Fall Risk Category Calculator: 0 Patient Fall Risk Level: Low Fall Risk Patient at Risk for Falls Due to: No Fall Risks Fall risk Follow up: Falls evaluation completed  Psychosocial Psychosocial Symptoms Reported: No symptoms reported Behavioral Management Strategies: Support system Behavioral Health Self-Management Outcome: 4 (good) Major Change/Loss/Stressor/Fears (CP): Denies Techniques to Cope with Loss/Stress/Change: Not applicable Quality of Family Relationships: involved, helpful Do you feel physically threatened by others?: No    03/30/2024    PHQ2-9 Depression Screening   Little interest or pleasure in doing things Not at all  Feeling down, depressed, or hopeless Not at all  PHQ-2 - Total Score 0  Trouble falling or staying asleep, or sleeping too much    Feeling tired or having little energy    Poor appetite or overeating     Feeling bad about yourself - or that you are a failure or have let yourself or your family down    Trouble concentrating on things, such as reading the newspaper or watching television    Moving or speaking so slowly that other people could have noticed.  Or the opposite - being so fidgety or restless that you have been moving around a lot more than usual    Thoughts  that you would be  better off dead, or hurting yourself in some way    PHQ2-9 Total Score    If you checked off any problems, how difficult have these problems made it for you to do your work, take care of things at home, or get along with other people    Depression Interventions/Treatment      Vitals:   03/30/24 0918  BP: (!) 129/93  Pulse: 75    Medications Reviewed Today     Reviewed by Bertrum Rosina HERO, RN (Registered Nurse) on 03/30/24 at 0930  Med List Status: <None>   Medication Order Taking? Sig Documenting Provider Last Dose Status Informant  Aloe Vera 72 % CREA [id number]  Apply 1 Application topically 2 (two) times daily as needed.  Patient not taking: Reported on 03/30/2024   Deitra Morton Sebastian Nena, NP  Consider Medication Status and Discontinue   aspirin 81 MG tablet [id number] Yes Take 81 mg by mouth daily. [provider]  Active   buPROPion  (WELLBUTRIN  SR) 150 MG 12 hr tablet [id number] Yes Take 1 tablet (150 mg total) by mouth in the morning. Jolinda Potter M, DO  Active   Capsaicin-Menthol-Methyl Sal (CAPSAICIN-METHYL SAL-MENTHOL) 0.025-1-12 % CREA [id number]  Apply 1 Application topically 2 (two) times daily as needed.  Patient not taking: Reported on 03/30/2024   Deitra Morton Sebastian Nena, NP  Active   Continuous Glucose Sensor (FREESTYLE LIBRE 3 PLUS SENSOR) [place] [id number] Yes Check BGs continuously E11.9. Change sensor every 15 days. Jolinda Potter M, DO  Active   cyclobenzaprine  (FLEXERIL ) 10 MG tablet [id number]  Take 1 tablet (10 mg total) by mouth 3 (three) times daily as needed for muscle spasms.  Patient not taking: Reported on 03/02/2024   Dettinger, Fonda LABOR, MD  Active   docusate sodium  (COLACE) 100 MG capsule [id number] Yes Take 200 mg by mouth daily as needed.  [provider]  Active   glucose blood (GNP EASY TOUCH GLUCOSE TEST) test strip [id number] Yes EVERY DAY Jolinda Potter M, DO  Active   insulin  glargine (LANTUS ) 100 UNIT/ML  injection [id number] Yes INJECT 0.22-0.3ML (22-30 UNITS) INTO THESKIN AT BEDTIME  Patient taking differently: 6 Units. INJECT 0.22-0.3ML (22-30 UNITS) INTO THESKIN AT BEDTIME   Jolinda Potter M, DO  Active   Insulin  Pen Needle (PEN NEEDLES) 32G X 4 MM MISC [id number] Yes UAD E11.69 Jolinda Potter M, DO  Active   Insulin  Syringe-Needle U-100 (INSULIN  SYRINGE .5CC/31GX5/16) 31G X 5/16 0.5 ML MISC [id number] Yes UAD with Lantus  E11.69 Jolinda Potter M, DO  Active   levocetirizine (XYZAL ) 5 MG tablet [id number]  Take 1 tablet (5 mg total) by mouth at bedtime as needed for allergies (itching/ rash).  Patient not taking: Reported on 03/30/2024   Jolinda Potter HERO, DO  Consider Medication Status and Discontinue   levothyroxine  (SYNTHROID ) 137 MCG tablet [id number] Yes TAKE ONE TABLET [facility] BEFORE BREAKFAST Jolinda Potter M, [place]  Active   losartan  (COZAAR ) 25 MG tablet [id number] Yes TAKE ONE (1) TABLET EACH DAY Gottschalk, Ashly M, [place]  Active   Multiple Vitamin (MULTIVITAMIN WITH MINERALS) TABS tablet [id number]  Take 1 tablet by mouth daily.  Patient not taking: Reported on 03/30/2024   [provider]  Consider Medication Status and Discontinue Self  nitroGLYCERIN  (NITROSTAT ) 0.4 MG SL tablet [id number]  Place 1 tablet (0.4 mg total) under the tongue every 5 (five) minutes as needed for chest pain.  Patient not taking: Reported on 03/30/2024   Miriam Norris, NP  Expired  02/27/24 2359   NOVOLOG  FLEXPEN 100 UNIT/ML FlexPen [id number] Yes INJECT 10-17 UNITS INTO THE SKIN 3 TIMESDAILY WITH MEALS Jolinda Potter M, DO  Active   ondansetron  (ZOFRAN -ODT) 4 MG disintegrating tablet [id number] Yes Take 1 tablet (4 mg total) by mouth every 8 (eight) hours as needed for nausea or vomiting. Jolinda Potter M, DO  Active   pantoprazole  (PROTONIX ) 40 MG tablet [id number] Yes TAKE 1 TABLET BY MOUTH DAILY FOR 1 MONTH THEN 1 TABLET DAILY AS NEEDED FOR REFLUX Jolinda Potter M, DO  Active    rosuvastatin  (CRESTOR ) 20 MG tablet [id number] Yes TAKE ONE (1) TABLET BY MOUTH EVERY DAY Jolinda Potter M, DO  Active   Semaglutide , 2 MG/DOSE, 8 MG/3ML SOPN [id number] Yes Inject 2 mg as directed once a week. Jolinda Potter M, DO  Active   SUMAtriptan  (IMITREX ) 100 MG tablet [id number] Yes MAY REPEAT IN 2 HOURS IF HEADACHE PERSISTS OR RECURS Jolinda Potter M, DO  Active   triamcinolone  cream (KENALOG ) 0.1 % [id number]  Apply 1 application. topically 2 (two) times daily. X7-10 days for rash  Patient not taking: Reported on 03/30/2024   Jolinda Potter HERO, DO  Consider Medication Status and Discontinue             Recommendation:   Continue Current Plan of Care Chiropractor for alternative therapy  Follow Up Plan:   Telephone follow-up in 1 month  Rosina Forte, BSN RN [facility], [facility] RN Care Manager Direct Dial: [phone]  Fax: [phone]

## 2024-03-30 NOTE — Patient Instructions (Signed)
 Visit Information  Thank you for taking time to visit with me today. Please don't hesitate to contact me if I can be of assistance to you before our next scheduled appointment.  Your next care management appointment is by telephone on 04-29-2024 at 10:00 am  Telephone follow-up in 1 month  Please call the care guide team at [phone] if you need to cancel, schedule, or reschedule an appointment.   Please call the Suicide and Crisis Lifeline: 988 call the [place]  National Suicide Prevention Lifeline: 9042493491 or TTY: 518-193-3553 TTY 825-080-5096) to talk to a trained counselor call 1-800-273-TALK (toll free, 24 hour hotline) if you are experiencing a Mental Health or Behavioral Health Crisis or need someone to talk to.  Rosina Forte, BSN RN [facility], [facility] RN Care Manager Direct Dial: [phone]  Fax: [phone]

## 2024-03-31 ENCOUNTER — Ambulatory Visit: Admitting: Urology

## 2024-04-03 ENCOUNTER — Other Ambulatory Visit: Payer: Self-pay | Admitting: Family Medicine

## 2024-04-03 DIAGNOSIS — K219 Gastro-esophageal reflux disease without esophagitis: Secondary | ICD-10-CM

## 2024-04-06 ENCOUNTER — Ambulatory Visit: Payer: Self-pay | Admitting: Family Medicine

## 2024-04-13 ENCOUNTER — Telehealth: Payer: Self-pay | Admitting: Pharmacist

## 2024-04-13 NOTE — Telephone Encounter (Signed)
   Unsuccessful outreach to patient today to discuss patient assistance alternatives to Ozempic  for 2026.  Patient stable on current regimen.  VM left encouraging return call.  Tysheem Accardo Dattero Jina Olenick, PharmD, BCACP, CPP Clinical Pharmacist, [facility]

## 2024-04-21 ENCOUNTER — Telehealth: Payer: Self-pay | Admitting: Family Medicine

## 2024-04-21 NOTE — Telephone Encounter (Signed)
 Melissa Schneider, pt stated that she got your message.

## 2024-04-22 ENCOUNTER — Encounter: Payer: Self-pay | Admitting: Family Medicine

## 2024-04-22 ENCOUNTER — Ambulatory Visit: Payer: Self-pay | Admitting: Family Medicine

## 2024-04-22 ENCOUNTER — Ambulatory Visit: Admitting: Family Medicine

## 2024-04-22 VITALS — BP 122/65 | HR 67 | Temp 98.3°F | Ht 62.0 in | Wt 181.0 lb

## 2024-04-22 DIAGNOSIS — H5713 Ocular pain, bilateral: Secondary | ICD-10-CM

## 2024-04-22 DIAGNOSIS — Z794 Long term (current) use of insulin: Secondary | ICD-10-CM

## 2024-04-22 DIAGNOSIS — M8588 Other specified disorders of bone density and structure, other site: Secondary | ICD-10-CM | POA: Diagnosis not present

## 2024-04-22 DIAGNOSIS — R41840 Attention and concentration deficit: Secondary | ICD-10-CM | POA: Diagnosis not present

## 2024-04-22 DIAGNOSIS — E785 Hyperlipidemia, unspecified: Secondary | ICD-10-CM | POA: Diagnosis not present

## 2024-04-22 DIAGNOSIS — R519 Headache, unspecified: Secondary | ICD-10-CM

## 2024-04-22 DIAGNOSIS — K219 Gastro-esophageal reflux disease without esophagitis: Secondary | ICD-10-CM | POA: Diagnosis not present

## 2024-04-22 DIAGNOSIS — E1142 Type 2 diabetes mellitus with diabetic polyneuropathy: Secondary | ICD-10-CM

## 2024-04-22 DIAGNOSIS — E119 Type 2 diabetes mellitus without complications: Secondary | ICD-10-CM

## 2024-04-22 DIAGNOSIS — E1159 Type 2 diabetes mellitus with other circulatory complications: Secondary | ICD-10-CM

## 2024-04-22 DIAGNOSIS — I152 Hypertension secondary to endocrine disorders: Secondary | ICD-10-CM | POA: Diagnosis not present

## 2024-04-22 DIAGNOSIS — L819 Disorder of pigmentation, unspecified: Secondary | ICD-10-CM | POA: Diagnosis not present

## 2024-04-22 DIAGNOSIS — E039 Hypothyroidism, unspecified: Secondary | ICD-10-CM | POA: Diagnosis not present

## 2024-04-22 DIAGNOSIS — Z0000 Encounter for general adult medical examination without abnormal findings: Secondary | ICD-10-CM

## 2024-04-22 DIAGNOSIS — Z1211 Encounter for screening for malignant neoplasm of colon: Secondary | ICD-10-CM | POA: Diagnosis not present

## 2024-04-22 DIAGNOSIS — E559 Vitamin D deficiency, unspecified: Secondary | ICD-10-CM

## 2024-04-22 DIAGNOSIS — E1169 Type 2 diabetes mellitus with other specified complication: Secondary | ICD-10-CM

## 2024-04-22 DIAGNOSIS — R413 Other amnesia: Secondary | ICD-10-CM | POA: Diagnosis not present

## 2024-04-22 DIAGNOSIS — Z0001 Encounter for general adult medical examination with abnormal findings: Secondary | ICD-10-CM

## 2024-04-22 DIAGNOSIS — N179 Acute kidney failure, unspecified: Secondary | ICD-10-CM

## 2024-04-22 LAB — BAYER DCA HB A1C WAIVED: HB A1C (BAYER DCA - WAIVED): 7.3 % — ABNORMAL HIGH (ref 4.8–5.6)

## 2024-04-22 MED ORDER — PEN NEEDLES 32G X 4 MM MISC
3 refills | Status: AC
Start: 2024-04-22 — End: ?

## 2024-04-22 MED ORDER — LOSARTAN POTASSIUM 25 MG PO TABS: ORAL_TABLET | ORAL | 3 refills | Status: AC

## 2024-04-22 MED ORDER — NOVOLOG FLEXPEN 100 UNIT/ML ~~LOC~~ SOPN
10.0000 [IU] | PEN_INJECTOR | Freq: Three times a day (TID) | SUBCUTANEOUS | 3 refills | Status: AC
Start: 2024-04-22 — End: ?

## 2024-04-22 MED ORDER — FREESTYLE LIBRE 3 PLUS SENSOR MISC
4 refills | Status: AC
Start: 2024-04-22 — End: ?

## 2024-04-22 MED ORDER — LEVOTHYROXINE SODIUM 137 MCG PO TABS
ORAL_TABLET | ORAL | 3 refills | Status: AC
Start: 2024-04-22 — End: ?

## 2024-04-22 MED ORDER — ROSUVASTATIN CALCIUM 20 MG PO TABS: 20.0000 mg | ORAL_TABLET | Freq: Every day | ORAL | 3 refills | Status: AC

## 2024-04-22 MED ORDER — PANTOPRAZOLE SODIUM 40 MG PO TBEC: 40.0000 mg | DELAYED_RELEASE_TABLET | Freq: Every day | ORAL | 3 refills | Status: AC

## 2024-04-22 MED ORDER — BUPROPION HCL ER (SR) 150 MG PO TB12: 150.0000 mg | ORAL_TABLET | Freq: Every morning | ORAL | 3 refills | Status: AC

## 2024-04-22 NOTE — Progress Notes (Signed)
 Melissa Schneider is a 68 y.o. female presents to office today for annual physical exam examination.     Type 2 Diabetes with hypertension, hyperlipidemia w/ neuropathy; headache, memory changes:  She utilizes freestyle libre 3 CGM for continuous glucose monitoring with average blood sugars ranging anywhere from 89-180 postprandially.  She is able to stop her Lantus  a couple of weeks ago and is only utilizing NovoLog  with meals now.  Compliant with Ozempic , statin and blood pressure medications.  She denies any GI side effects of Ozempic   Last eye exam: UTD Last foot exam: needs Last A1c:  Lab Results  Component Value Date   HGBA1C 7.2 (H) 01/27/2024   Nephropathy screen indicated?: needs Last flu, zoster and/or pneumovax:  Immunization History  Administered Date(s) Administered   Influenza Whole 05/16/2010   Influenza,inj,Quad PF,6+ Mos 05/10/2015, 04/26/2016, 06/19/2017, 05/28/2018   Pneumococcal Conjugate-13 01/29/2014   Pneumococcal Polysaccharide-23 07/16/2008, 01/20/2009, 01/30/2016   Td 07/17/2007, 11/23/2007   Tdap 07/28/2019    ROS: No chest pain, shortness of breath.  She does report some ocular pain that is been ongoing since the spring and seems to be getting a little worse.  It seems to affect her left more than right and she describes it as sharp and fleeting pain that occurs roughly every 3 days.  She tried Imitrex  thinking maybe it was a migraine headache but it did not help.  She does not report any visual disturbance.  She is due to have her eyes checked next month.  She reports associated posterior head pain at the base of her scalp.  Wondering if this maybe is a tension headache causing the symptoms.  Denies any preceding injury.  She also reports some memory loss.  Has not seen neurology in over 20 years  Skin lesions observed on left lower lip and right side of nose that are new over the last year.  Not getting any larger or any darker but the seem to be more  prominent from the skin.  Was under the care of Dr. Shona and would like to see him again   Occupation: retired Energy manager, Marital status: married, Substance use: none Health Maintenance Due  Topic Date Due   Zoster Vaccines- Shingrix (1 of 2) Never done   Colonoscopy  01/09/2017   FOOT EXAM  10/03/2023   Influenza Vaccine  02/14/2024   Diabetic kidney evaluation - Urine ACR  04/15/2024    Immunization History  Administered Date(s) Administered   Influenza Whole 05/16/2010   Influenza,inj,Quad PF,6+ Mos 05/10/2015, 04/26/2016, 06/19/2017, 05/28/2018   Pneumococcal Conjugate-13 01/29/2014   Pneumococcal Polysaccharide-23 07/16/2008, 01/20/2009, 01/30/2016   Td 07/17/2007, 11/23/2007   Tdap 07/28/2019   Past Medical History:  Diagnosis Date   Anemia 07/23/2011   Anxiety    Diabetes mellitus    Diabetic gastroparesis (HCC) 07/23/2011   Suspected   Headache(784.0)    Hearing difficulty    Hyperlipidemia    Hypothyroidism    Kidney stones    Microcytic anemia 07/23/2011   Recurrent obstructive pyelonephritis 08/14/2013   Thalassemia minor    ????? not really worked up per pt but daughter has it???   Social History   Socioeconomic History   Marital status: Married    Spouse name: Johnie   Number of children: 1   Years of education: Not on file   Highest education level: Some college, no degree  Occupational History   Occupation: Training and development officer: [employer]  Occupation: Retired  Tobacco Use   Smoking status: Never   Smokeless tobacco: Never   Tobacco comments:    Never smoked  Vaping Use   Vaping status: Never Used  Substance and Sexual Activity   Alcohol use: No    Alcohol/week: 0.0 standard drinks of alcohol   Drug use: No   Sexual activity: Not Currently  Other Topics Concern   Not on file  Social History Narrative   Lives home with husband - Daughter lives in [place] - she has a great grandchild also   Social Drivers of  Corporate investment banker Strain: Low Risk  (04/21/2024)   Overall Financial Resource Strain (CARDIA)    Difficulty of Paying Living Expenses: Not hard at all  Food Insecurity: No Food Insecurity (04/21/2024)   Hunger Vital Sign    Worried About Running Out of Food in the Last Year: Never true    Ran Out of Food in the Last Year: Never true  Transportation Needs: No Transportation Needs (04/21/2024)   PRAPARE - Administrator, Civil Service (Medical): No    Lack of Transportation (Non-Medical): No  Physical Activity: Sufficiently Active (04/21/2024)   Exercise Vital Sign    Days of Exercise per Week: 5 days    Minutes of Exercise per Session: 30 min  Recent Concern: Physical Activity - Insufficiently Active (02/27/2024)   Exercise Vital Sign    Days of Exercise per Week: 3 days    Minutes of Exercise per Session: 30 min  Stress: No Stress Concern Present (04/21/2024)   [employer] of Occupational Health - Occupational Stress Questionnaire    Feeling of Stress: Not at all  Social Connections: Socially Integrated (04/21/2024)   Social Connection and Isolation Panel    Frequency of Communication with Friends and Family: More than three times a week    Frequency of Social Gatherings with Friends and Family: Once a week    Attends Religious Services: More than 4 times per year    Active Member of [employer] or Organizations: Yes    Attends Banker Meetings: More than 4 times per year    Marital Status: Married  Catering manager Violence: Not At Risk (03/02/2024)   Humiliation, Afraid, Rape, and Kick questionnaire    Fear of Current or Ex-Partner: No    Emotionally Abused: No    Physically Abused: No    Sexually Abused: No   Past Surgical History:  Procedure Laterality Date   COLONOSCOPY  2004   Dr. Zachary Porch: small fissue and hemorrhoids   COLONOSCOPY  12/2011   Dr. Burnette: normal   CYSTOSCOPY W/ URETERAL STENT PLACEMENT Right 08/13/2013   Procedure:  CYSTOSCOPY WITH RETROGRADE PYELOGRAM/URETERAL STENT PLACEMENT;  Surgeon: Emery LILLETTE Blaze, MD;  Location: [facility];  Service: Urology;  Laterality: Right;   EXTRACORPOREAL SHOCK WAVE LITHOTRIPSY Right 08/19/2013   Procedure: EXTRACORPOREAL SHOCK WAVE LITHOTRIPSY (ESWL) RIGHT URETERAL CALCULUS;  Surgeon: Emery LILLETTE Blaze, MD;  Location: [facility];  Service: Urology;  Laterality: Right;   WRIST SURGERY Right    Family History  Problem Relation Age of Onset   Colon polyps Mother    Rheum arthritis Sister    Breast cancer Maternal Aunt    Breast cancer Maternal Aunt    Colon cancer Maternal Grandmother        age 96   CAD Brother     Current Outpatient Medications:    Aloe Vera 72 % CREA, Apply 1 Application topically  2 (two) times daily as needed. (Patient not taking: Reported on 03/30/2024), Disp: 114 g, Rfl: 0   aspirin 81 MG tablet, Take 81 mg by mouth daily., Disp: , Rfl:    buPROPion  (WELLBUTRIN  SR) 150 MG 12 hr tablet, Take 1 tablet (150 mg total) by mouth in the morning., Disp: 90 tablet, Rfl: 3   Capsaicin-Menthol-Methyl Sal (CAPSAICIN-METHYL SAL-MENTHOL) 0.025-1-12 % CREA, Apply 1 Application topically 2 (two) times daily as needed. (Patient not taking: Reported on 03/30/2024), Disp: 56.6 g, Rfl: 0   Continuous Glucose Sensor (FREESTYLE LIBRE 3 PLUS SENSOR) MISC, Check BGs continuously E11.9. Change sensor every 15 days., Disp: 6 each, Rfl: 4   cyclobenzaprine  (FLEXERIL ) 10 MG tablet, Take 1 tablet (10 mg total) by mouth 3 (three) times daily as needed for muscle spasms. (Patient not taking: Reported on 03/02/2024), Disp: 30 tablet, Rfl: 0   docusate sodium  (COLACE) 100 MG capsule, Take 200 mg by mouth daily as needed. , Disp: , Rfl:    glucose blood (GNP EASY TOUCH GLUCOSE TEST) test strip, EVERY DAY, Disp: 100 each, Rfl: 11   insulin  glargine (LANTUS ) 100 UNIT/ML injection, INJECT 0.22-0.3ML (22-30 UNITS) INTO THESKIN AT BEDTIME (Patient taking differently: 6 Units. INJECT 0.22-0.3ML (22-30  UNITS) INTO THESKIN AT BEDTIME), Disp: 30 mL, Rfl: 0   Insulin  Pen Needle (PEN NEEDLES) 32G X 4 MM MISC, UAD E11.69, Disp: 100 each, Rfl: 3   Insulin  Syringe-Needle U-100 (INSULIN  SYRINGE .5CC/31GX5/16) 31G X 5/16 0.5 ML MISC, UAD with Lantus  E11.69, Disp: 100 each, Rfl: 3   levocetirizine (XYZAL ) 5 MG tablet, Take 1 tablet (5 mg total) by mouth at bedtime as needed for allergies (itching/ rash). (Patient not taking: Reported on 03/30/2024), Disp: 90 tablet, Rfl: 0   levothyroxine  (SYNTHROID ) 137 MCG tablet, TAKE ONE TABLET EACH MORNING BEFORE BREAKFAST, Disp: 90 tablet, Rfl: 2   losartan  (COZAAR ) 25 MG tablet, TAKE ONE (1) TABLET EACH DAY, Disp: 90 tablet, Rfl: 3   Multiple Vitamin (MULTIVITAMIN WITH MINERALS) TABS tablet, Take 1 tablet by mouth daily. (Patient not taking: Reported on 03/30/2024), Disp: , Rfl:    nitroGLYCERIN  (NITROSTAT ) 0.4 MG SL tablet, Place 1 tablet (0.4 mg total) under the tongue every 5 (five) minutes as needed for chest pain. (Patient not taking: Reported on 03/30/2024), Disp: 25 tablet, Rfl: 3   NOVOLOG  FLEXPEN 100 UNIT/ML FlexPen, INJECT 10-17 UNITS INTO THE SKIN 3 TIMESDAILY WITH MEALS, Disp: 45 mL, Rfl: 0   ondansetron  (ZOFRAN -ODT) 4 MG disintegrating tablet, Take 1 tablet (4 mg total) by mouth every 8 (eight) hours as needed for nausea or vomiting., Disp: 20 tablet, Rfl: 0   pantoprazole  (PROTONIX ) 40 MG tablet, TAKE 1 TABLET BY MOUTH DAILY FOR 1 MONTH THEN 1 TABLET DAILY AS NEEDED FOR REFLUX, Disp: 90 tablet, Rfl: 0   rosuvastatin  (CRESTOR ) 20 MG tablet, TAKE ONE (1) TABLET BY MOUTH EVERY DAY, Disp: 90 tablet, Rfl: 0   Semaglutide , 2 MG/DOSE, 8 MG/3ML SOPN, Inject 2 mg as directed once a week., Disp: , Rfl:    SUMAtriptan  (IMITREX ) 100 MG tablet, MAY REPEAT IN 2 HOURS IF HEADACHE PERSISTS OR RECURS, Disp: 9 tablet, Rfl: 1   triamcinolone  cream (KENALOG ) 0.1 %, Apply 1 application. topically 2 (two) times daily. X7-10 days for rash (Patient not taking: Reported on  03/30/2024), Disp: 80 g, Rfl: 0  Allergies  Allergen Reactions   Codeine Nausea Only and Other (See Comments)    NAUSEA AND CAUSES HER TO HAVE BAD DREAMS  Flomax  [Tamsulosin  Hcl] Other (See Comments)    Hypotension   Gabapentin      Confusion    Lisinopril     Coughing     ROS: Review of Systems Pertinent items noted in HPI and remainder of comprehensive ROS otherwise negative.   Of note back pain has improved.  Physical exam BP 122/65   Pulse 67   Temp 98.3 F (36.8 C)   Ht 5' 2 (1.575 m)   Wt 181 lb (82.1 kg)   SpO2 95%   BMI 33.11 kg/m  General appearance: alert, cooperative, appears stated age, no distress, and moderately obese Head: Normocephalic, without obvious abnormality, atraumatic Eyes: negative findings: lids and lashes normal, conjunctivae and sclerae normal, corneas clear, and pupils equal, round, reactive to light and accomodation Ears: Dried cerumen appreciated but not obstructing TM which shows dulled light reflex bilaterally.  Wears hearing aids Nose: Nares normal. Septum midline. Mucosa normal. No drainage or sinus tenderness. Throat: lips, mucosa, and tongue normal; teeth and gums normal Neck: no adenopathy, no carotid bruit, supple, symmetrical, trachea midline, and thyroid  not enlarged, symmetric, no tenderness/mass/nodules Back: Increased kyphosis of the thoracic spine with limited extension. Lungs: clear to auscultation bilaterally Heart: regular rate and rhythm, S1, S2 normal, no murmur, click, rub or gallop Abdomen: Obese, soft, nontender Extremities: varicose veins noted Pulses: 2+ and symmetric Skin: Has healing ecchymosis along the anterior lower legs bilaterally.  Multiple pigmented nevi noted throughout legs and along face and neck Lymph nodes: Cervical, supraclavicular, and axillary nodes normal. Neurologic: Wears hearing aids.  Otherwise cranial nerves II through XII grossly intact     04/22/2024    8:49 AM 03/30/2024    9:23 AM  03/02/2024    9:24 AM  Depression screen PHQ 2/9  Decreased Interest 1 0 0  Down, Depressed, Hopeless 0 0 0  PHQ - 2 Score 1 0 0  Altered sleeping 1    Tired, decreased energy 1    Change in appetite 0    Feeling bad or failure about yourself  0    Trouble concentrating 0    Moving slowly or fidgety/restless 0    Suicidal thoughts 0    PHQ-9 Score 3    Difficult doing work/chores Not difficult at all        04/22/2024    8:49 AM 01/27/2024   11:26 AM 09/27/2023   12:05 PM 08/30/2023   11:37 AM  GAD 7 : Generalized Anxiety Score  Nervous, Anxious, on Edge 0 0 0 0  Control/stop worrying 0 0 0 0  Worry too much - different things 1 0 0 0  Trouble relaxing 0 0 0 0  Restless 0 0 0 0  Easily annoyed or irritable 0 0 0 0  Afraid - awful might happen 0 0 0 0  Total GAD 7 Score 1 0 0 0  Anxiety Difficulty Not difficult at all Not difficult at all  Not difficult at all       04/22/2024    8:41 AM  MMSE - Mini Mental State Exam  Orientation to time 5  Orientation to Place 5  Registration 3  Attention/ Calculation 5  Recall 3  Language- name 2 objects 2  Language- repeat 1  Language- follow 3 step command 2  Language- read & follow direction 1  Write a sentence 1  Copy design 1  Total score 29   Diabetic Foot Exam - Simple   Simple Foot Form Diabetic Foot exam  was performed with the following findings: Yes 04/22/2024  9:18 AM  Visual Inspection No deformities, no ulcerations, no other skin breakdown bilaterally: Yes Sensation Testing Intact to touch and monofilament testing bilaterally: Yes Pulse Check Posterior Tibialis and Dorsalis pulse intact bilaterally: Yes Comments     Assessment/ Plan: Montie JONELLE Louder here for annual physical exam.   Annual physical exam  Ocular pain, bilateral - Plan: Ambulatory referral to Neurology  Recurrent occipital headache - Plan: Ambulatory referral to Neurology  Memory change - Plan: Ambulatory referral to  Neurology  Pigmented skin lesion - Plan: Ambulatory referral to Dermatology  Insulin -requiring or dependent type II diabetes mellitus (HCC) - Plan: Bayer DCA Hb A1c Waived, CMP14+EGFR, Microalbumin / creatinine urine ratio, Continuous Glucose Sensor (FREESTYLE LIBRE 3 PLUS SENSOR) MISC, Insulin  Pen Needle (PEN NEEDLES) 32G X 4 MM MISC, insulin  aspart (NOVOLOG  FLEXPEN) 100 UNIT/ML FlexPen  Diabetic polyneuropathy associated with type 2 diabetes mellitus (HCC) - Plan: CMP14+EGFR  Hyperlipidemia associated with type 2 diabetes mellitus (HCC) - Plan: CMP14+EGFR, Lipid Panel, rosuvastatin  (CRESTOR ) 20 MG tablet  Hypertension associated with diabetes (HCC) - Plan: CMP14+EGFR, losartan  (COZAAR ) 25 MG tablet  Acquired hypothyroidism - Plan: CMP14+EGFR, TSH + free T4, levothyroxine  (SYNTHROID ) 137 MCG tablet  Osteopenia of lumbar spine - Plan: VITAMIN D 25 Hydroxy (Vit-D Deficiency, Fractures), DG [facility] DEXA  Concentration deficit - Plan: buPROPion  (WELLBUTRIN  SR) 150 MG 12 hr tablet  Gastroesophageal reflux disease without esophagitis - Plan: pantoprazole  (PROTONIX ) 40 MG tablet   She declined vaccinations today.  Has paperwork for colonoscopy and we will get that returned ASAP  ?  Occipital neuralgia causing ocular pain and headache.  Cannot take gabapentin  due to history of confusion.  Has trialed her home migraine headache medications.  Going to refer her to neurology for further evaluation and possible discussion of may be occipital Botox injections to alleviate symptoms.  We did an MMSE on her today which did not demonstrate any abnormalities but would also like them to revisit maybe more in-depth memory testing  Pigmented skin lesions appear to be benign but will refer to dermatology since these are new  Nonfasting labs collected today.  Medications have been renewed.  Diabetic foot exam performed.  She has diabetic eye exam coming up next month.  Blood pressure well-controlled.  Clinically  euthyroid.  Check thyroid  levels  She will be due for DEXA scan next month so this has been preordered and vitamin D collected  GERD stable with PPI  Counseled on healthy lifestyle choices, including diet (rich in fruits, vegetables and lean meats and low in salt and simple carbohydrates) and exercise (at least 30 minutes of moderate physical activity daily).  Patient to follow up 3 to 4 months for recheck diabetes  Gaudencio Chesnut M. Jolinda, DO

## 2024-04-22 NOTE — Patient Instructions (Signed)
 Back of the Head Pain (Occipital Neuralgia): What to Know  Occipital neuralgia is a headache that causes very bad pain in the back of the head. This pain may spread to other parts of the head. The pain may be caused by irritation of the nerves in the spinal cord. The nerves are found just below the base of the head. These nerves transmit feeling from the back of the head, the top of the head, and the areas behind the ears. What are the causes? This headache can happen without any cause. This is known as primary headache syndrome. In some cases, the pain may be caused by problems that irritate or put pressure on the nerves. These include: A muscle spasm in the neck. Neck injury. Arthritis, or wear and tear of the neck bones. Disease of the disks that separate the neck bones. Swollen blood vessels that put pressure on the nerves. Infections. Tumors. What are the signs or symptoms? This problem causes brief pain that feels like burning or stabbing, or like an electric shooting pain in the back of the head. This pain may spread to the top of the head. It can happen on one side or both sides of the head. You may also feel: Pain behind the eye. Pain when you move your neck, or when you brush your hair. Soreness in the scalp. Aching in the back of the head between times of very bad pain. Pain that gets worse when you're around bright lights. How is this diagnosed?  This problem may be diagnosed based on your symptoms and a physical exam. A health care provider may put pressure on certain nerves in the neck to find where the pain is. Other tests may also be done, such as: MRI or CT scan of the brain and neck. Shots of medicine into the nerves to numb the area and see if the pain goes away. This is called a nerve block. How is this treated? Treatment for this condition may begin with rest, massage, and over-the-counter pain medicine. You may also use an ice pack or a heating pad. If these  treatments don't work, you may need stronger medicines that are prescribed by your provider. These include: Medicines to treat swelling. Medicines to relax your muscles. Medicines for seizures that also treat pain. Medicines for depression that also treat pain. Shots to numb the area and lessen swelling. These are called anesthetics or steroids. Pulsed radiofrequency ablation. This is when wires are used to give electrical signals that block pain from the nerves. Surgery to relieve pressure on the nerves. Physical therapy. Follow these instructions at home: Managing pain     Stop activities that cause pain, if possible. Rest when you have an attack of pain. Try gentle massage to decrease the pain. Try a different pillow or sleeping position. Use heat as told. Use the heat source that your provider recommends, such as a moist heat pack or a heating pad. Do this as often as told. Place a towel between your skin and the heat source. Leave the heat on for 20-30 minutes. Use ice or an ice pack as told. Place a towel between your skin and the ice. Leave the ice on for 20 minutes, 2-3 times a day. If your skin turns red, take off the ice or heat right away to prevent skin damage. The risk of damage is higher if you can't feel pain, heat, or cold. General instructions Take your medicines only as told. Avoid things that make  your pain worse, like bright lights. Try to stay active. Get regular exercise that doesn't cause pain. Ask your provider to suggest safe exercises for you. Work with a physical therapist to learn stretching exercises you can do at home. Use good posture. Contact a health care provider if: Your medicine is not working. You have new or worse symptoms. Get help right away if: You have very bad head pain that doesn't go away. You have a sudden change in vision, balance, or speech. These symptoms may be an emergency. Call 911 right away. Do not wait to see if the symptoms  will go away. Do not drive yourself to the hospital. This information is not intended to replace advice given to you by your health care provider. Make sure you discuss any questions you have with your health care provider. Document Revised: 04/05/2023 Document Reviewed: 04/05/2023 Elsevier Patient Education  2025 [employer].

## 2024-04-23 LAB — MICROALBUMIN / CREATININE URINE RATIO
Creatinine, Urine: 137.9 mg/dL
Microalb/Creat Ratio: 2 mg/g{creat} (ref 0–29)
Microalbumin, Urine: 3 ug/mL

## 2024-04-23 LAB — CMP14+EGFR
ALT: 14 IU/L (ref 0–32)
AST: 16 IU/L (ref 0–40)
Albumin: 4.4 g/dL (ref 3.9–4.9)
Alkaline Phosphatase: 147 IU/L — ABNORMAL HIGH (ref 49–135)
BUN/Creatinine Ratio: 13 (ref 12–28)
BUN: 15 mg/dL (ref 8–27)
Bilirubin Total: 0.4 mg/dL (ref 0.0–1.2)
CO2: 23 mmol/L (ref 20–29)
Calcium: 10.6 mg/dL — ABNORMAL HIGH (ref 8.7–10.3)
Chloride: 98 mmol/L (ref 96–106)
Creatinine, Ser: 1.17 mg/dL — ABNORMAL HIGH (ref 0.57–1.00)
Globulin, Total: 2.9 g/dL (ref 1.5–4.5)
Glucose: 113 mg/dL — ABNORMAL HIGH (ref 70–99)
Potassium: 4.2 mmol/L (ref 3.5–5.2)
Sodium: 139 mmol/L (ref 134–144)
Total Protein: 7.3 g/dL (ref 6.0–8.5)
eGFR: 51 mL/min/1.73 — ABNORMAL LOW (ref >59)

## 2024-04-23 LAB — LIPID PANEL
Chol/HDL Ratio: 2.4 ratio (ref 0.0–4.4)
Cholesterol, Total: 238 mg/dL — ABNORMAL HIGH (ref 100–199)
HDL: 98 mg/dL (ref >39)
LDL Chol Calc (NIH): 127 mg/dL — ABNORMAL HIGH (ref 0–99)
Triglycerides: 77 mg/dL (ref 0–149)
VLDL Cholesterol Cal: 13 mg/dL (ref 5–40)

## 2024-04-23 LAB — TSH+FREE T4
Free T4: 1.68 ng/dL (ref 0.82–1.77)
TSH: 1.2 u[IU]/mL (ref 0.450–4.500)

## 2024-04-23 LAB — VITAMIN D 25 HYDROXY (VIT D DEFICIENCY, FRACTURES): Vit D, 25-Hydroxy: 19.5 ng/mL — AB (ref 30.0–100.0)

## 2024-04-24 MED ORDER — VITAMIN D (ERGOCALCIFEROL) 1.25 MG (50000 UNIT) PO CAPS: 50000.0000 [IU] | ORAL_CAPSULE | ORAL | 3 refills | Status: AC

## 2024-04-29 ENCOUNTER — Other Ambulatory Visit: Payer: Self-pay | Admitting: *Deleted

## 2024-04-29 NOTE — Patient Outreach (Signed)
 Complex Care Management   Visit Note  04/29/2024  Name:  Melissa Schneider MRN: [id number] DOB: May 05, 1956  Situation: Referral received for Complex Care Management related to Diabetes with Complications and HTN I obtained verbal consent from Patient.  Visit completed with Patient  on the phone  Background:   Past Medical History:  Diagnosis Date   Anemia 07/23/2011   Anxiety    Diabetes mellitus    Diabetic gastroparesis (HCC) 07/23/2011   Suspected   Headache(784.0)    Hearing difficulty    Hyperlipidemia    Hypothyroidism    Kidney stones    Microcytic anemia 07/23/2011   Recurrent obstructive pyelonephritis 08/14/2013   Thalassemia minor    ????? not really worked up per pt but daughter has it???    Assessment: Patient Reported Symptoms:  Cognitive Cognitive Status: No symptoms reported Cognitive/Intellectual Conditions Management [RPT]: None reported or documented in medical history or problem list   Health Maintenance Behaviors: Annual physical exam Healing Pattern: Average Health Facilitated by: Rest, Stress management  Neurological Neurological Review of Symptoms: No symptoms reported Neurological Management Strategies: Routine screening Neurological Self-Management Outcome: 4 (good)  HEENT HEENT Symptoms Reported: No symptoms reported HEENT Management Strategies: Routine screening HEENT Self-Management Outcome: 4 (good)    Cardiovascular Cardiovascular Symptoms Reported: No symptoms reported Does patient have uncontrolled Hypertension?: No Cardiovascular Management Strategies: Routine screening Cardiovascular Self-Management Outcome: 4 (good)  Respiratory Respiratory Symptoms Reported: No symptoms reported Respiratory Management Strategies: Routine screening Respiratory Self-Management Outcome: 4 (good)  Endocrine Endocrine Symptoms Reported: No symptoms reported Is patient diabetic?: Yes Is patient checking blood sugars at home?: Yes List most recent  blood sugar readings, include date and time of day: 04-29-2024 at 0700 72 Endocrine Self-Management Outcome: 4 (good)  Gastrointestinal Gastrointestinal Symptoms Reported: No symptoms reported Gastrointestinal Self-Management Outcome: 4 (good) Nutrition Risk Screen (CP): No indicators present  Genitourinary Genitourinary Symptoms Reported: No symptoms reported Genitourinary Self-Management Outcome: 4 (good)  Integumentary Integumentary Symptoms Reported: Bruising Skin Self-Management Outcome: 4 (good)  Musculoskeletal Musculoskelatal Symptoms Reviewed: No symptoms reported Musculoskeletal Management Strategies: Routine screening Musculoskeletal Self-Management Outcome: 4 (good) Falls in the past year?: No Number of falls in past year: 1 or less Was there an injury with Fall?: No Fall Risk Category Calculator: 0 Patient Fall Risk Level: Low Fall Risk Patient at Risk for Falls Due to: No Fall Risks Fall risk Follow up: Falls evaluation completed  Psychosocial Psychosocial Symptoms Reported: No symptoms reported Behavioral Management Strategies: Coping strategies Behavioral Health Self-Management Outcome: 4 (good) Major Change/Loss/Stressor/Fears (CP): Denies Techniques to Cope with Loss/Stress/Change: Not applicable      04/29/2024    PHQ2-9 Depression Screening   Little interest or pleasure in doing things Not at all  Feeling down, depressed, or hopeless Not at all  PHQ-2 - Total Score 0  Trouble falling or staying asleep, or sleeping too much    Feeling tired or having little energy    Poor appetite or overeating     Feeling bad about yourself - or that you are a failure or have let yourself or your family down    Trouble concentrating on things, such as reading the newspaper or watching television    Moving or speaking so slowly that other people could have noticed.  Or the opposite - being so fidgety or restless that you have been moving around a lot more than usual     Thoughts that you would be better off dead, or hurting yourself in some way  PHQ2-9 Total Score    If you checked off any problems, how difficult have these problems made it for you to do your work, take care of things at home, or get along with other people    Depression Interventions/Treatment      There were no vitals filed for this visit.  Medications Reviewed Today     Reviewed by Bertrum Rosina HERO, RN (Registered Nurse) on 04/29/24 at 1033  Med List Status: <None>   Medication Order Taking? Sig Documenting Provider Last Dose Status Informant  Aloe Vera 72 % CREA [id number]  Apply 1 Application topically 2 (two) times daily as needed.  Patient not taking: Reported on 04/29/2024   Deitra Morton Sebastian Nena, NP  Consider Medication Status and Discontinue   aspirin 81 MG tablet [id number] Yes Take 81 mg by mouth daily. [provider]  Active   buPROPion  (WELLBUTRIN  SR) 150 MG 12 hr tablet [id number] Yes Take 1 tablet (150 mg total) by mouth in the morning. Jolinda Potter M, DO  Active   Continuous Glucose Sensor (FREESTYLE LIBRE 3 PLUS SENSOR) [place] [id number] Yes Check BGs continuously E11.9. Change sensor every 15 days. Jolinda Potter M, DO  Active   docusate sodium  (COLACE) 100 MG capsule [id number] Yes Take 200 mg by mouth daily as needed.  [provider]  Active   glucose blood (GNP EASY TOUCH GLUCOSE TEST) test strip [id number] Yes EVERY DAY Jolinda Potter M, DO  Active   insulin  aspart (NOVOLOG  FLEXPEN) 100 UNIT/ML FlexPen [id number] Yes Inject 10-17 Units into the skin 3 (three) times daily with meals. Jolinda Potter HERO, DO  Active   Insulin  Pen Needle (PEN NEEDLES) 32G X 4 MM MISC [id number] Yes UAD E11.69 Jolinda Potter M, DO  Active   Insulin  Syringe-Needle U-100 (INSULIN  SYRINGE .5CC/31GX5/16) 31G X 5/16 0.5 ML MISC [id number] Yes UAD with Lantus  E11.69 Jolinda Potter HERO, DO  Active   levothyroxine  (SYNTHROID ) 137 MCG tablet [id number] Yes TAKE ONE  TABLET EACH MORNING BEFORE BREAKFAST Jolinda Potter M, [place]  Active   losartan  (COZAAR ) 25 MG tablet [id number] Yes TAKE ONE (1) TABLET EACH DAY Gottschalk, Ashly M, DO  Active   nitroGLYCERIN  (NITROSTAT ) 0.4 MG SL tablet [id number] Yes Place 1 tablet (0.4 mg total) under the tongue every 5 (five) minutes as needed for chest pain. Miriam Norris, NP  Active   ondansetron  (ZOFRAN -ODT) 4 MG disintegrating tablet [id number] Yes Take 1 tablet (4 mg total) by mouth every 8 (eight) hours as needed for nausea or vomiting. Jolinda Potter M, DO  Active   pantoprazole  (PROTONIX ) 40 MG tablet [id number] Yes Take 1 tablet (40 mg total) by mouth daily. Jolinda Potter M, DO  Active   rosuvastatin  (CRESTOR ) 20 MG tablet [id number] Yes Take 1 tablet (20 mg total) by mouth daily. Jolinda Potter HERO, DO  Active   Semaglutide , 2 MG/DOSE, 8 MG/3ML SOPN [id number] Yes Inject 2 mg as directed once a week. Jolinda Potter M, DO  Active            Med Note TENA, MLISS JONETTA Kitchens Apr 13, 2024  3:59 PM) Via [employer] [facility] patient assistance program    SUMAtriptan  (IMITREX ) 100 MG tablet [id number] Yes MAY REPEAT IN 2 HOURS IF HEADACHE PERSISTS OR RECURS Gottschalk, Ashly M, DO  Active   Vitamin D, Ergocalciferol, (DRISDOL) 1.25 MG (50000 UNIT) CAPS capsule [id number] Yes Take 1 capsule (50,000 Units total) by mouth every 7 (seven) days. Jolinda Potter HERO, DO  Active  Recommendation:   Continue Current Plan of Care  Follow Up Plan:   Telephone follow-up in 1 month  Rosina Forte, BSN RN [facility], [facility] RN Care Manager Direct Dial: [phone]  Fax: [phone]

## 2024-04-29 NOTE — Patient Instructions (Signed)
 Visit Information  Thank you for taking time to visit with me today. Please don't hesitate to contact me if I can be of assistance to you before our next scheduled appointment.  Your next care management appointment is by telephone on 06-01-2024 at 10:00 am  Telephone follow-up in 1 month  Please call the care guide team at [phone] if you need to cancel, schedule, or reschedule an appointment.   Please call the Suicide and Crisis Lifeline: 988 call the [place]  National Suicide Prevention Lifeline: 260-314-0865 or TTY: 754-816-7750 TTY 780-727-0089) to talk to a trained counselor call 1-800-273-TALK (toll free, 24 hour hotline) if you are experiencing a Mental Health or Behavioral Health Crisis or need someone to talk to.  Rosina Forte, BSN RN [facility], [facility] RN Care Manager Direct Dial: [phone]  Fax: [phone]

## 2024-05-01 ENCOUNTER — Other Ambulatory Visit

## 2024-05-01 DIAGNOSIS — N179 Acute kidney failure, unspecified: Secondary | ICD-10-CM | POA: Diagnosis not present

## 2024-05-02 LAB — PTH, INTACT AND CALCIUM: PTH: 52 pg/mL (ref 15–65)

## 2024-05-02 LAB — BASIC METABOLIC PANEL WITH GFR
BUN/Creatinine Ratio: 16 (ref 12–28)
BUN: 13 mg/dL (ref 8–27)
CO2: 21 mmol/L (ref 20–29)
Calcium: 9 mg/dL (ref 8.7–10.3)
Chloride: 102 mmol/L (ref 96–106)
Creatinine, Ser: 0.82 mg/dL (ref 0.57–1.00)
Glucose: 203 mg/dL — ABNORMAL HIGH (ref 70–99)
Potassium: 4.1 mmol/L (ref 3.5–5.2)
Sodium: 136 mmol/L (ref 134–144)
eGFR: 78 mL/min/1.73 (ref >59)

## 2024-05-04 ENCOUNTER — Ambulatory Visit: Payer: Self-pay | Admitting: Family Medicine

## 2024-05-05 ENCOUNTER — Other Ambulatory Visit: Payer: Self-pay | Admitting: Family Medicine

## 2024-05-22 ENCOUNTER — Telehealth: Payer: Self-pay

## 2024-05-22 NOTE — Telephone Encounter (Signed)
   Emailed refills to [facility]

## 2024-05-25 ENCOUNTER — Encounter (INDEPENDENT_AMBULATORY_CARE_PROVIDER_SITE_OTHER): Payer: Self-pay | Admitting: *Deleted

## 2024-06-01 ENCOUNTER — Other Ambulatory Visit: Payer: Self-pay | Admitting: *Deleted

## 2024-06-01 ENCOUNTER — Encounter: Payer: Self-pay | Admitting: *Deleted

## 2024-06-01 NOTE — Patient Instructions (Signed)
 Visit Information  Thank you for taking time to visit with me today. Please don't hesitate to contact me if I can be of assistance to you before our next scheduled appointment.  Your next care management appointment is by telephone on 06-30-24 at 10:00am  Telephone follow-up in 1 month  Please call the care guide team at [phone] if you need to cancel, schedule, or reschedule an appointment.   Please call the Suicide and Crisis Lifeline: 988 call the [place]  National Suicide Prevention Lifeline: 740-798-6248 or TTY: (445)556-7102 TTY 2500624780) to talk to a trained counselor call 1-800-273-TALK (toll free, 24 hour hotline) if you are experiencing a Mental Health or Behavioral Health Crisis or need someone to talk to.  Americo Nicks RN RN Care Manager [facility] [phone]

## 2024-06-01 NOTE — Patient Outreach (Signed)
 Complex Care Management   Visit Note  06/01/2024  Name:  Melissa Schneider MRN: [id number] DOB: [birth year]  Situation: Referral received for Complex Care Management related to Diabetes with Complications and HTN I obtained verbal consent from Patient.  Visit completed with Patient  on the phone  Background:   Past Medical History:  Diagnosis Date   Anemia 07/23/2011   Anxiety    Diabetes mellitus    Diabetic gastroparesis (HCC) 07/23/2011   Suspected   Headache(784.0)    Hearing difficulty    Hyperlipidemia    Hypothyroidism    Kidney stones    Microcytic anemia 07/23/2011   Recurrent obstructive pyelonephritis 08/14/2013   Thalassemia minor    ????? not really worked up per pt but daughter has it???    Assessment: Patient Reported Symptoms:  Cognitive Cognitive Status: No symptoms reported   Health Maintenance Behaviors: None Healing Pattern: Average Health Facilitated by: Healthy diet  Neurological Neurological Review of Symptoms: No symptoms reported Neurological Management Strategies: Routine screening Neurological Self-Management Outcome: 4 (good)  HEENT HEENT Symptoms Reported: No symptoms reported HEENT Management Strategies: Routine screening HEENT Self-Management Outcome: 4 (good)    Cardiovascular Cardiovascular Symptoms Reported: No symptoms reported Does patient have uncontrolled Hypertension?: No Cardiovascular Management Strategies: Routine screening Weight: 179 lb (81.2 kg) Cardiovascular Self-Management Outcome: 4 (good)  Respiratory Respiratory Symptoms Reported: No symptoms reported Respiratory Management Strategies: Routine screening Respiratory Self-Management Outcome: 4 (good)  Endocrine Endocrine Symptoms Reported: No symptoms reported Is patient diabetic?: Yes Is patient checking blood sugars at home?: Yes List most recent blood sugar readings, include date and time of day: 87 Endocrine Self-Management Outcome: 4 (good)  Gastrointestinal  Gastrointestinal Symptoms Reported: No symptoms reported Gastrointestinal Management Strategies: Adequate rest Gastrointestinal Self-Management Outcome: 4 (good)    Genitourinary Genitourinary Symptoms Reported: No symptoms reported Genitourinary Self-Management Outcome: 4 (good)  Integumentary Integumentary Symptoms Reported: No symptoms reported Skin Management Strategies: Activity, Adequate rest Skin Self-Management Outcome: 4 (good)  Musculoskeletal Musculoskelatal Symptoms Reviewed: No symptoms reported Musculoskeletal Management Strategies: Routine screening Musculoskeletal Self-Management Outcome: 4 (good) Falls in the past year?: No Number of falls in past year: 1 or less Patient at Risk for Falls Due to: Other (Comment)  Psychosocial Psychosocial Symptoms Reported: No symptoms reported Behavioral Health Self-Management Outcome: 4 (good) Major Change/Loss/Stressor/Fears (CP): Denies Techniques to Cope with Loss/Stress/Change: None Do you feel physically threatened by others?: No    06/01/2024    PHQ2-9 Depression Screening   Little interest or pleasure in doing things    Feeling down, depressed, or hopeless    PHQ-2 - Total Score    Trouble falling or staying asleep, or sleeping too much    Feeling tired or having little energy    Poor appetite or overeating     Feeling bad about yourself - or that you are a failure or have let yourself or your family down    Trouble concentrating on things, such as reading the newspaper or watching television    Moving or speaking so slowly that other people could have noticed.  Or the opposite - being so fidgety or restless that you have been moving around a lot more than usual    Thoughts that you would be better off dead, or hurting yourself in some way    PHQ2-9 Total Score    If you checked off any problems, how difficult have these problems made it for you to do your work, take care of things at home, or get along  with other people     Depression Interventions/Treatment      There were no vitals filed for this visit. Pain Scale: 0-10 Pain Score: 0-No pain  Medications Reviewed Today     Reviewed by Nivia Aadarsh Cozort , RN (Registered Nurse) on 06/01/24 at 0940  Med List Status: <None>   Medication Order Taking? Sig Documenting Provider Last Dose Status Informant  Aloe Vera 72 % CREA [id number]  Apply 1 Application topically 2 (two) times daily as needed.  Patient not taking: Reported on 06/01/2024   Deitra Morton Sebastian Nena, NP  Active   aspirin 81 MG tablet [id number] Yes Take 81 mg by mouth daily. [provider]  Active   buPROPion  (WELLBUTRIN  SR) 150 MG 12 hr tablet [id number] Yes Take 1 tablet (150 mg total) by mouth in the morning. Jolinda Potter M, DO  Active   Continuous Glucose Sensor (FREESTYLE LIBRE 3 PLUS SENSOR) [place] [id number] Yes Check BGs continuously E11.9. Change sensor every 15 days. Jolinda Potter M, DO  Active   docusate sodium  (COLACE) 100 MG capsule [id number] Yes Take 200 mg by mouth daily as needed.  [provider]  Active   glucose blood (GNP EASY TOUCH GLUCOSE TEST) test strip [id number] Yes EVERY DAY Jolinda Potter M, DO  Active   insulin  aspart (NOVOLOG  FLEXPEN) 100 UNIT/ML FlexPen [id number]  Inject 10-17 Units into the skin 3 (three) times daily with meals. Jolinda Potter HERO, DO  Active   Insulin  Pen Needle (PEN NEEDLES) 32G X 4 MM MISC [id number]  UAD E11.69 Jolinda Potter M, DO  Active   Insulin  Syringe-Needle U-100 (INSULIN  SYRINGE .5CC/31GX5/16) 31G X 5/16 0.5 ML MISC [id number] Yes UAD with Lantus  E11.69 Jolinda Potter HERO, DO  Active   levothyroxine  (SYNTHROID ) 137 MCG tablet [id number] Yes TAKE ONE TABLET EACH MORNING BEFORE BREAKFAST Jolinda Potter M, [place]  Active   losartan  (COZAAR ) 25 MG tablet [id number] Yes TAKE ONE (1) TABLET EACH DAY Gottschalk, Ashly M, DO  Active   nitroGLYCERIN  (NITROSTAT ) 0.4 MG SL tablet [id number]  Place 1 tablet (0.4 mg total)  under the tongue every 5 (five) minutes as needed for chest pain.  Patient not taking: Reported on 06/01/2024   Miriam Norris, NP  Expired 04/29/24 2359   ondansetron  (ZOFRAN -ODT) 4 MG disintegrating tablet [id number] Yes Take 1 tablet (4 mg total) by mouth every 8 (eight) hours as needed for nausea or vomiting. Jolinda Potter M, DO  Active   pantoprazole  (PROTONIX ) 40 MG tablet [id number] Yes Take 1 tablet (40 mg total) by mouth daily. Jolinda Potter M, DO  Active   rosuvastatin  (CRESTOR ) 20 MG tablet [id number] Yes Take 1 tablet (20 mg total) by mouth daily. Jolinda Potter HERO, DO  Active   Semaglutide , 2 MG/DOSE, 8 MG/3ML SOPN [id number] Yes Inject 2 mg as directed once a week. Jolinda Potter M, DO  Active            Med Note TENA, MLISS JONETTA Kitchens Apr 13, 2024  3:59 PM) Via [employer] patient assistance program    SUMAtriptan  (IMITREX ) 100 MG tablet [id number] Yes MAY REPEAT IN 2 HOURS IF HEADACHE PERSISTS OR RECURS Gottschalk, Ashly M, DO  Active   Vitamin D, Ergocalciferol, (DRISDOL) 1.25 MG (50000 UNIT) CAPS capsule [id number]  Take 1 capsule (50,000 Units total) by mouth every 7 (seven) days. Jolinda Potter HERO, DO  Active             Recommendation:   Continue Current Plan of Care  Follow Up Plan:   Telephone follow-up in 1 month  Petrina Melby RN RN Care Manager [employer] [phone]

## 2024-06-03 NOTE — Telephone Encounter (Signed)
 Faxed completed refill form to [facility] for Ozempic  2mg  dose pens

## 2024-06-04 ENCOUNTER — Encounter: Payer: Self-pay | Admitting: Family Medicine

## 2024-06-29 ENCOUNTER — Telehealth: Payer: Self-pay

## 2024-06-30 ENCOUNTER — Other Ambulatory Visit: Payer: Self-pay

## 2024-06-30 NOTE — Patient Instructions (Signed)
 Visit Information  Thank you for taking time to visit with me today. Please don't hesitate to contact me if I can be of assistance to you before our next scheduled appointment.  Your next care management appointment is by telephone on 07/30/24 at 9 am.  Please call the care guide team at [phone] if you need to cancel, schedule, or reschedule an appointment.   Please call the Suicide and Crisis Lifeline: 988 call the [place]  National Suicide Prevention Lifeline: (581) 332-7484 or TTY: (907) 595-1734 TTY 845 609 1370) to talk to a trained counselor call 1-800-273-TALK (toll free, 24 hour hotline) if you are experiencing a Mental Health or Behavioral Health Crisis or need someone to talk to.  Warren Quivers RN CM Population Health-Complex Care Management [facility] [phone]

## 2024-06-30 NOTE — Patient Outreach (Signed)
 Complex Care Management   Visit Note  06/30/2024  Name:  Melissa Schneider MRN: [id number] DOB: 1955/11/17  Situation: Referral received for Complex Care Management related to Diabetes and Hypertension. I obtained verbal consent from Patient.  Visit completed with Patient  on the phone  Background:   Past Medical History:  Diagnosis Date   Anemia 07/23/2011   Anxiety    Diabetes mellitus    Diabetic gastroparesis (HCC) 07/23/2011   Suspected   Headache(784.0)    Hearing difficulty    Hyperlipidemia    Hypothyroidism    Kidney stones    Microcytic anemia 07/23/2011   Recurrent obstructive pyelonephritis 08/14/2013   Thalassemia minor    ????? not really worked up per pt but daughter has it???    Assessment: Patient Reported Symptoms:  Cognitive Cognitive Status: No symptoms reported   Health Maintenance Behaviors: Stress management  Neurological Neurological Review of Symptoms: No symptoms reported Neurological Management Strategies: Routine screening, Adequate rest Neurological Self-Management Outcome: 4 (good)  HEENT HEENT Symptoms Reported: No symptoms reported (wears hearing aids)      Cardiovascular Cardiovascular Symptoms Reported: No symptoms reported Does patient have uncontrolled Hypertension?: No Cardiovascular Management Strategies: Adequate rest, Weight management, Medication therapy Do You Have a Working Readable Scale?: Yes Weight: 179 lb (81.2 kg) Cardiovascular Self-Management Outcome: 4 (good)  Respiratory Respiratory Symptoms Reported: No symptoms reported    Endocrine Endocrine Symptoms Reported: No symptoms reported Is patient diabetic?: Yes Is patient checking blood sugars at home?: Yes List most recent blood sugar readings, include date and time of day: patient reports 106 this morning before eating Endocrine Self-Management Outcome: 4 (good)  Gastrointestinal Gastrointestinal Symptoms Reported: No symptoms reported Additional Gastrointestinal  Details: last BM reported 12/15      Genitourinary Genitourinary Symptoms Reported: No symptoms reported Other Genitourinary Symptoms: Patient reports hx of kidney stone. She states that provider told her it is small and should pass without difficulty. Patient has no signs/symptoms today.    Integumentary Integumentary Symptoms Reported: Other Other Integumentary Symptoms: Patient reports small  brown places noted on face that are new. Has a dermatology appointment scheduled for January 8th    Musculoskeletal Musculoskelatal Symptoms Reviewed: Back pain Additional Musculoskeletal Details: patient reports chronic back pain but states she is seeing a chiropractor and it has greatly improved. Musculoskeletal Management Strategies: Routine screening, Adequate rest      Psychosocial Psychosocial Symptoms Reported: Other Other Psychosocial Conditions: Patient reports feeling overwhelmed at times. Reports that she feels like her Wellbutrin  may not be working as well for her now. Patient reports she is going to send a message to Dr. Jolinda via Mychart to discuss. She does have an upcoming appointment with Dr. Jolinda scheduled for February as well. Behavioral Management Strategies: Medication therapy, Coping strategies Behavioral Health Self-Management Outcome: 4 (good) Major Change/Loss/Stressor/Fears (CP): Denies      06/30/2024    PHQ2-9 Depression Screening   Little interest or pleasure in doing things Not at all  Feeling down, depressed, or hopeless Not at all  PHQ-2 - Total Score 0  Trouble falling or staying asleep, or sleeping too much    Feeling tired or having little energy    Poor appetite or overeating     Feeling bad about yourself - or that you are a failure or have let yourself or your family down    Trouble concentrating on things, such as reading the newspaper or watching television    Moving or speaking so slowly that other  people could have noticed.  Or the opposite  - being so fidgety or restless that you have been moving around a lot more than usual    Thoughts that you would be better off dead, or hurting yourself in some way    PHQ2-9 Total Score    If you checked off any problems, how difficult have these problems made it for you to do your work, take care of things at home, or get along with other people    Depression Interventions/Treatment      Today's Vitals   06/30/24 1001  BP: 123/65  Pulse: 84  Weight: 179 lb (81.2 kg)   Pain Scale: 0-10 Pain Score: 0-No pain  Medications Reviewed Today     Reviewed by Leodis Warren DEL, RN (Registered Nurse) on 06/30/24 at [phone]  Med List Status: <None>   Medication Order Taking? Sig Documenting Provider Last Dose Status Informant  Aloe Vera 72 % CREA [id number]  Apply 1 Application topically 2 (two) times daily as needed.  Patient not taking: Reported on 06/30/2024   Deitra Morton Sebastian Nena, NP  Active   aspirin 81 MG tablet [id number] Yes Take 81 mg by mouth daily. [provider]  Active   buPROPion  (WELLBUTRIN  SR) 150 MG 12 hr tablet [id number] Yes Take 1 tablet (150 mg total) by mouth in the morning. Jolinda Potter M, DO  Active   Continuous Glucose Sensor (FREESTYLE LIBRE 3 PLUS SENSOR) [place] [id number] Yes Check BGs continuously E11.9. Change sensor every 15 days. Jolinda Potter M, DO  Active   docusate sodium  (COLACE) 100 MG capsule [id number] Yes Take 200 mg by mouth daily as needed.  [provider]  Active   glucose blood (GNP EASY TOUCH GLUCOSE TEST) test strip [id number] Yes EVERY DAY Jolinda Potter M, DO  Active   insulin  aspart (NOVOLOG  FLEXPEN) 100 UNIT/ML FlexPen [id number] Yes Inject 10-17 Units into the skin 3 (three) times daily with meals. Jolinda Potter HERO, DO  Active   Insulin  Pen Needle (PEN NEEDLES) 32G X 4 MM MISC [id number] Yes UAD E11.69 Jolinda Potter M, DO  Active   Insulin  Syringe-Needle U-100 (INSULIN  SYRINGE .5CC/31GX5/16) 31G X 5/16 0.5 ML MISC  [id number] Yes UAD with Lantus  E11.69 Jolinda Potter HERO, DO  Active   levothyroxine  (SYNTHROID ) 137 MCG tablet [id number] Yes TAKE ONE TABLET EACH MORNING BEFORE BREAKFAST Jolinda Potter M, [place]  Active   losartan  (COZAAR ) 25 MG tablet [id number] Yes TAKE ONE (1) TABLET EACH DAY Gottschalk, Ashly M, DO  Active   nitroGLYCERIN  (NITROSTAT ) 0.4 MG SL tablet [id number]  Place 1 tablet (0.4 mg total) under the tongue every 5 (five) minutes as needed for chest pain.  Patient not taking: Reported on 06/30/2024   Miriam Norris, NP  Expired 04/29/24 2359   ondansetron  (ZOFRAN -ODT) 4 MG disintegrating tablet [id number] Yes Take 1 tablet (4 mg total) by mouth every 8 (eight) hours as needed for nausea or vomiting. Jolinda Potter M, DO  Active   pantoprazole  (PROTONIX ) 40 MG tablet [id number] Yes Take 1 tablet (40 mg total) by mouth daily. Jolinda Potter M, DO  Active   rosuvastatin  (CRESTOR ) 20 MG tablet [id number] Yes Take 1 tablet (20 mg total) by mouth daily. Jolinda Potter HERO, DO  Active   Semaglutide , 2 MG/DOSE, 8 MG/3ML SOPN [id number] Yes Inject 2 mg as directed once a week. Jolinda Potter HERO, DO  Active            Med Note (PRUITT, JULIE D  Mon Apr 13, 2024  3:59 PM) Via [employer] patient assistance program    SUMAtriptan  (IMITREX ) 100 MG tablet [id number] Yes MAY REPEAT IN 2 HOURS IF HEADACHE PERSISTS OR RECURS Gottschalk, Ashly M, DO  Active   Vitamin D , Ergocalciferol , (DRISDOL ) 1.25 MG (50000 UNIT) CAPS capsule [id number] Yes Take 1 capsule (50,000 Units total) by mouth every 7 (seven) days. Jolinda Norene HERO, DO  Active             Recommendation:   Continue Current Plan of Care  Follow Up Plan:   Telephone follow up appointment date/time:  07/30/24 at 9 am  Warren Quivers RN CM Population Health-Complex Care Management [facility] [phone]

## 2024-07-04 ENCOUNTER — Other Ambulatory Visit: Payer: Self-pay | Admitting: Family Medicine

## 2024-07-06 ENCOUNTER — Telehealth: Payer: Self-pay | Admitting: Pharmacist

## 2024-07-06 NOTE — Telephone Encounter (Signed)
" ° °  This patient is appearing on a report for being at risk of failing the adherence measure for cholesterol (statin) and hypertension (ACEi/ARB) medications this calendar year.   Medication: losartan , rosuvastatin  Last fill date: 12/20 for 90 day supply  Insurance report was not up to date. No action needed at this time.    Darrion Macaulay Dattero Cage Gupton, PharmD, BCACP, CPP Clinical Pharmacist, [facility], [facility]  "

## 2024-07-30 ENCOUNTER — Telehealth: Payer: Self-pay | Admitting: *Deleted

## 2024-07-30 ENCOUNTER — Encounter: Payer: Self-pay | Admitting: *Deleted

## 2024-07-30 NOTE — Patient Instructions (Signed)
 Melissa Schneider - At your request, I have rescheduled your appointment with me to 08-04-2024 at 10:30 am. I work with Jolinda Norene HERO, DO and am calling to support your healthcare needs. Please contact me at [phone] at your earliest convenience. I look forward to speaking with you soon.   Thank you,  Rosina Forte, BSN RN [facility], [facility] RN Care Manager Direct Dial: [phone]  Fax: [phone]

## 2024-08-04 ENCOUNTER — Other Ambulatory Visit: Payer: Self-pay | Admitting: *Deleted

## 2024-08-04 NOTE — Patient Instructions (Signed)
 Visit Information  Thank you for taking time to visit with me today. Please don't hesitate to contact me if I can be of assistance to you before our next scheduled appointment.  Your next care management appointment is by telephone on 09-10-2024 at 10:30 am Telephone follow-up in 1 month  Please call the care guide team at [phone] if you need to cancel, schedule, or reschedule an appointment.   Please call the Suicide and Crisis Lifeline: 988 call the [place]  National Suicide Prevention Lifeline: (212) 460-5597 or TTY: 2670778298 TTY (351) 773-5308) to talk to a trained counselor call 1-800-273-TALK (toll free, 24 hour hotline) if you are experiencing a Mental Health or Behavioral Health Crisis or need someone to talk to.  Rosina Forte, BSN RN [facility], [facility] RN Care Manager Direct Dial: [phone]  Fax: [phone]

## 2024-08-04 NOTE — Patient Outreach (Signed)
 Complex Care Management   Visit Note  08/04/2024  Name:  Melissa Schneider MRN: [id number] DOB: [birth year]  Situation: Referral received for Complex Care Management related to Diabetes with Complications and HTN I obtained verbal consent from Patient.  Visit completed with Patient  on the phone  Background:   Past Medical History:  Diagnosis Date   Anemia 07/23/2011   Anxiety    Diabetes mellitus    Diabetic gastroparesis (HCC) 07/23/2011   Suspected   Headache(784.0)    Hearing difficulty    Hyperlipidemia    Hypothyroidism    Kidney stones    Microcytic anemia 07/23/2011   Recurrent obstructive pyelonephritis 08/14/2013   Thalassemia minor    ????? not really worked up per pt but daughter has it???    Assessment: Patient Reported Symptoms:  Cognitive Cognitive Status: No symptoms reported Cognitive/Intellectual Conditions Management [RPT]: None reported or documented in medical history or problem list   Health Maintenance Behaviors: Annual physical exam Healing Pattern: Average Health Facilitated by: Rest  Neurological Neurological Review of Symptoms: Numbness Neurological Management Strategies: Routine screening Neurological Self-Management Outcome: 4 (good)  HEENT HEENT Symptoms Reported: No symptoms reported HEENT Management Strategies: Routine screening HEENT Self-Management Outcome: 4 (good)    Cardiovascular Cardiovascular Symptoms Reported: No symptoms reported Does patient have uncontrolled Hypertension?: No Cardiovascular Management Strategies: Routine screening Do You Have a Working Readable Scale?: Yes Weight: 179 lb (81.2 kg) Cardiovascular Self-Management Outcome: 4 (good)  Respiratory Respiratory Symptoms Reported: No symptoms reported Respiratory Management Strategies: Routine screening  Endocrine Endocrine Symptoms Reported: No symptoms reported Is patient diabetic?: Yes Is patient checking blood sugars at home?: Yes List most recent blood sugar  readings, include date and time of day: CBG 112 Endocrine Self-Management Outcome: 4 (good)  Gastrointestinal Gastrointestinal Symptoms Reported: No symptoms reported Gastrointestinal Self-Management Outcome: 4 (good) Nutrition Risk Screen (CP): No indicators present  Genitourinary Genitourinary Symptoms Reported: No symptoms reported Genitourinary Self-Management Outcome: 4 (good)  Integumentary Integumentary Symptoms Reported: No symptoms reported Additional Integumentary Details: Mole removed - abnormal cells will follow up with derm in 6 months Skin Management Strategies: Routine screening Skin Self-Management Outcome: 4 (good)  Musculoskeletal Musculoskelatal Symptoms Reviewed: Back pain Musculoskeletal Management Strategies: Routine screening Musculoskeletal Self-Management Outcome: 4 (good) Falls in the past year?: No Number of falls in past year: 1 or less Was there an injury with Fall?: No Fall Risk Category Calculator: 0 Patient Fall Risk Level: Low Fall Risk Patient at Risk for Falls Due to: No Fall Risks Fall risk Follow up: Falls evaluation completed  Psychosocial Psychosocial Symptoms Reported: No symptoms reported Behavioral Health Self-Management Outcome: 4 (good) Major Change/Loss/Stressor/Fears (CP): Denies Techniques to Cope with Loss/Stress/Change: None      08/04/2024    PHQ2-9 Depression Screening   Little interest or pleasure in doing things Not at all  Feeling down, depressed, or hopeless Not at all  PHQ-2 - Total Score 0  Trouble falling or staying asleep, or sleeping too much    Feeling tired or having little energy    Poor appetite or overeating     Feeling bad about yourself - or that you are a failure or have let yourself or your family down    Trouble concentrating on things, such as reading the newspaper or watching television    Moving or speaking so slowly that other people could have noticed.  Or the opposite - being so fidgety or restless that  you have been moving around a lot more than usual  Thoughts that you would be better off dead, or hurting yourself in some way    PHQ2-9 Total Score    If you checked off any problems, how difficult have these problems made it for you to do your work, take care of things at home, or get along with other people    Depression Interventions/Treatment      Today's Vitals   08/04/24 1048  BP: 116/61  Weight: 179 lb (81.2 kg)   Pain Score: 0-No pain  Medications Reviewed Today     Reviewed by Bertrum Rosina HERO, RN (Registered Nurse) on 08/04/24 at 1046  Med List Status: <None>   Medication Order Taking? Sig Documenting Provider Last Dose Status Informant  Aloe Vera 72 % CREA [id number] Yes Apply 1 Application topically 2 (two) times daily as needed. St Morton Hummer, Nena, NP  Active   aspirin 81 MG tablet [id number] Yes Take 81 mg by mouth daily. [provider]  Active   buPROPion  (WELLBUTRIN  SR) 150 MG 12 hr tablet [id number] Yes Take 1 tablet (150 mg total) by mouth in the morning. Jolinda Potter M, DO  Active   Continuous Glucose Sensor (FREESTYLE LIBRE 3 PLUS SENSOR) [place] [id number] Yes Check BGs continuously E11.9. Change sensor every 15 days. Jolinda Potter M, DO  Active   docusate sodium  (COLACE) 100 MG capsule [id number] Yes Take 200 mg by mouth daily as needed.  [provider]  Active   glucose blood (GNP EASY TOUCH GLUCOSE TEST) test strip [id number] Yes EVERY DAY Jolinda Potter M, DO  Active   insulin  aspart (NOVOLOG  FLEXPEN) 100 UNIT/ML FlexPen [id number] Yes Inject 10-17 Units into the skin 3 (three) times daily with meals. Jolinda Potter M, DO  Active   Insulin  Pen Needle (PEN NEEDLES) 32G X 4 MM MISC [id number] Yes UAD E11.69 Jolinda Potter M, DO  Active   Insulin  Syringe-Needle U-100 (INSULIN  SYRINGE .5CC/31GX5/16) 31G X 5/16 0.5 ML MISC [id number] Yes UAD with Lantus  E11.69 Jolinda Potter HERO, DO  Active   levothyroxine  (SYNTHROID ) 137 MCG  tablet [id number] Yes TAKE ONE TABLET EACH MORNING BEFORE BREAKFAST Jolinda Potter M, [place]  Active   losartan  (COZAAR ) 25 MG tablet [id number] Yes TAKE ONE (1) TABLET EACH DAY Gottschalk, Ashly M, DO  Active   nitroGLYCERIN  (NITROSTAT ) 0.4 MG SL tablet [id number] Yes Place 1 tablet (0.4 mg total) under the tongue every 5 (five) minutes as needed for chest pain. Miriam Norris, NP  Active   ondansetron  (ZOFRAN -ODT) 4 MG disintegrating tablet [id number] Yes Take 1 tablet (4 mg total) by mouth every 8 (eight) hours as needed for nausea or vomiting. Jolinda Potter M, DO  Active   pantoprazole  (PROTONIX ) 40 MG tablet [id number] Yes Take 1 tablet (40 mg total) by mouth daily. Jolinda Potter M, DO  Active   rosuvastatin  (CRESTOR ) 20 MG tablet [id number] Yes Take 1 tablet (20 mg total) by mouth daily. Jolinda Potter M, DO  Active   Semaglutide , 2 MG/DOSE, 8 MG/3ML SOPN [id number] Yes Inject 2 mg as directed once a week. Jolinda Potter M, DO  Active            Med Note TENA, MLISS JONETTA Kitchens Apr 13, 2024  3:59 PM) Via [employer] patient assistance program    SUMAtriptan  (IMITREX ) 100 MG tablet [id number] Yes MAY REPEAT IN 2 HOURS IF HEADACHE PERSISTS OR RECURS Jolinda Potter M, DO  Active   Vitamin D , Ergocalciferol , (DRISDOL ) 1.25 MG (50000 UNIT) CAPS capsule [id number] Yes Take  1 capsule (50,000 Units total) by mouth every 7 (seven) days. Jolinda Norene HERO, DO  Active             Recommendation:   Continue Current Plan of Care  Follow Up Plan:   Telephone follow-up in 1 month  Rosina Forte, BSN RN [facility], [facility] RN Care Manager Direct Dial: [phone]  Fax: [phone]

## 2024-08-20 ENCOUNTER — Ambulatory Visit: Admitting: Nurse Practitioner

## 2024-09-08 ENCOUNTER — Other Ambulatory Visit: Payer: Self-pay

## 2024-09-08 ENCOUNTER — Ambulatory Visit: Payer: Self-pay | Admitting: Family Medicine

## 2024-09-10 ENCOUNTER — Telehealth: Admitting: *Deleted

## 2024-11-06 ENCOUNTER — Ambulatory Visit: Admitting: Nurse Practitioner

## 2025-03-01 ENCOUNTER — Ambulatory Visit: Payer: Self-pay

## 2025-04-26 ENCOUNTER — Encounter: Payer: Self-pay | Admitting: Family Medicine
